# Patient Record
Sex: Female | Born: 1955 | Race: White | Hispanic: No | Marital: Single | Smoking: Former smoker
Health system: Southern US, Community
[De-identification: ages and names within clinical notes are randomized; demographics above are authoritative.]

## PROBLEM LIST (undated history)

## (undated) DIAGNOSIS — I10 Essential (primary) hypertension: Secondary | ICD-10-CM

## (undated) DIAGNOSIS — M810 Age-related osteoporosis without current pathological fracture: Secondary | ICD-10-CM

## (undated) DIAGNOSIS — M1990 Unspecified osteoarthritis, unspecified site: Secondary | ICD-10-CM

## (undated) DIAGNOSIS — F32A Depression, unspecified: Secondary | ICD-10-CM

## (undated) DIAGNOSIS — R413 Other amnesia: Secondary | ICD-10-CM

## (undated) DIAGNOSIS — T7840XA Allergy, unspecified, initial encounter: Secondary | ICD-10-CM

## (undated) DIAGNOSIS — M8700 Idiopathic aseptic necrosis of unspecified bone: Secondary | ICD-10-CM

## (undated) DIAGNOSIS — F329 Major depressive disorder, single episode, unspecified: Secondary | ICD-10-CM

## (undated) DIAGNOSIS — E559 Vitamin D deficiency, unspecified: Secondary | ICD-10-CM

## (undated) DIAGNOSIS — E785 Hyperlipidemia, unspecified: Secondary | ICD-10-CM

## (undated) DIAGNOSIS — J449 Chronic obstructive pulmonary disease, unspecified: Secondary | ICD-10-CM

## (undated) DIAGNOSIS — E119 Type 2 diabetes mellitus without complications: Secondary | ICD-10-CM

## (undated) DIAGNOSIS — K219 Gastro-esophageal reflux disease without esophagitis: Secondary | ICD-10-CM

## (undated) DIAGNOSIS — G459 Transient cerebral ischemic attack, unspecified: Secondary | ICD-10-CM

## (undated) HISTORY — DX: Gastro-esophageal reflux disease without esophagitis: K21.9

## (undated) HISTORY — DX: Essential (primary) hypertension: I10

## (undated) HISTORY — DX: Unspecified osteoarthritis, unspecified site: M19.90

## (undated) HISTORY — DX: Vitamin D deficiency, unspecified: E55.9

## (undated) HISTORY — DX: Idiopathic aseptic necrosis of unspecified bone: M87.00

## (undated) HISTORY — PX: BACK SURGERY: SHX140

## (undated) HISTORY — DX: Chronic obstructive pulmonary disease, unspecified: J44.9

## (undated) HISTORY — PX: SPINE SURGERY: SHX786

## (undated) HISTORY — DX: Major depressive disorder, single episode, unspecified: F32.9

## (undated) HISTORY — DX: Depression, unspecified: F32.A

## (undated) HISTORY — PX: TONSILLECTOMY AND ADENOIDECTOMY: SUR1326

## (undated) HISTORY — DX: Other amnesia: R41.3

## (undated) HISTORY — DX: Type 2 diabetes mellitus without complications: E11.9

## (undated) HISTORY — PX: BREAST EXCISIONAL BIOPSY: SUR124

## (undated) HISTORY — DX: Allergy, unspecified, initial encounter: T78.40XA

## (undated) HISTORY — PX: ANKLE SURGERY: SHX546

## (undated) HISTORY — PX: CATARACT EXTRACTION: SUR2

## (undated) HISTORY — DX: Hyperlipidemia, unspecified: E78.5

---
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
## 2000-02-17 ENCOUNTER — Encounter: Payer: Self-pay | Admitting: Internal Medicine

## 2000-02-17 ENCOUNTER — Ambulatory Visit (HOSPITAL_COMMUNITY): Admission: RE | Admit: 2000-02-17 | Discharge: 2000-02-17 | Payer: Self-pay | Admitting: Internal Medicine

## 2001-05-04 ENCOUNTER — Encounter: Admission: RE | Admit: 2001-05-04 | Discharge: 2001-05-04 | Payer: Self-pay | Admitting: Internal Medicine

## 2001-05-04 ENCOUNTER — Encounter: Payer: Self-pay | Admitting: Internal Medicine

## 2001-05-08 ENCOUNTER — Emergency Department (HOSPITAL_COMMUNITY): Admission: EM | Admit: 2001-05-08 | Discharge: 2001-05-08 | Payer: Self-pay | Admitting: Emergency Medicine

## 2002-02-11 ENCOUNTER — Emergency Department (HOSPITAL_COMMUNITY): Admission: EM | Admit: 2002-02-11 | Discharge: 2002-02-11 | Payer: Self-pay

## 2002-02-12 ENCOUNTER — Encounter: Payer: Self-pay | Admitting: Internal Medicine

## 2002-02-12 ENCOUNTER — Ambulatory Visit (HOSPITAL_COMMUNITY): Admission: RE | Admit: 2002-02-12 | Discharge: 2002-02-12 | Payer: Self-pay | Admitting: Internal Medicine

## 2002-05-16 ENCOUNTER — Encounter: Payer: Self-pay | Admitting: Orthopedic Surgery

## 2002-05-16 ENCOUNTER — Observation Stay (HOSPITAL_COMMUNITY): Admission: RE | Admit: 2002-05-16 | Discharge: 2002-05-17 | Payer: Self-pay | Admitting: Orthopedic Surgery

## 2003-02-14 ENCOUNTER — Inpatient Hospital Stay (HOSPITAL_COMMUNITY): Admission: RE | Admit: 2003-02-14 | Discharge: 2003-02-17 | Payer: Self-pay | Admitting: Orthopaedic Surgery

## 2004-02-24 ENCOUNTER — Ambulatory Visit (HOSPITAL_COMMUNITY): Admission: RE | Admit: 2004-02-24 | Discharge: 2004-02-24 | Payer: Self-pay | Admitting: Internal Medicine

## 2004-04-11 ENCOUNTER — Emergency Department (HOSPITAL_COMMUNITY): Admission: EM | Admit: 2004-04-11 | Discharge: 2004-04-11 | Payer: Self-pay | Admitting: Emergency Medicine

## 2005-03-11 ENCOUNTER — Other Ambulatory Visit: Admission: RE | Admit: 2005-03-11 | Discharge: 2005-03-11 | Payer: Self-pay | Admitting: Internal Medicine

## 2005-03-26 ENCOUNTER — Ambulatory Visit (HOSPITAL_COMMUNITY): Admission: RE | Admit: 2005-03-26 | Discharge: 2005-03-26 | Payer: Self-pay | Admitting: Internal Medicine

## 2005-09-03 ENCOUNTER — Emergency Department (HOSPITAL_COMMUNITY): Admission: EM | Admit: 2005-09-03 | Discharge: 2005-09-03 | Payer: Self-pay | Admitting: Emergency Medicine

## 2006-01-18 LAB — HM COLONOSCOPY: HM Colonoscopy: NORMAL

## 2006-03-18 ENCOUNTER — Ambulatory Visit (HOSPITAL_COMMUNITY): Admission: RE | Admit: 2006-03-18 | Discharge: 2006-03-18 | Payer: Self-pay | Admitting: Internal Medicine

## 2006-05-04 ENCOUNTER — Ambulatory Visit (HOSPITAL_COMMUNITY): Admission: RE | Admit: 2006-05-04 | Discharge: 2006-05-04 | Payer: Self-pay | Admitting: Internal Medicine

## 2007-04-05 ENCOUNTER — Ambulatory Visit (HOSPITAL_COMMUNITY): Admission: RE | Admit: 2007-04-05 | Discharge: 2007-04-05 | Payer: Self-pay | Admitting: Internal Medicine

## 2007-05-30 ENCOUNTER — Ambulatory Visit: Payer: Self-pay | Admitting: Cardiology

## 2007-06-02 ENCOUNTER — Ambulatory Visit: Payer: Self-pay

## 2008-04-25 ENCOUNTER — Ambulatory Visit (HOSPITAL_COMMUNITY): Admission: RE | Admit: 2008-04-25 | Discharge: 2008-04-25 | Payer: Self-pay | Admitting: Internal Medicine

## 2008-04-25 ENCOUNTER — Other Ambulatory Visit: Admission: RE | Admit: 2008-04-25 | Discharge: 2008-04-25 | Payer: Self-pay | Admitting: Internal Medicine

## 2009-04-18 LAB — HM MAMMOGRAPHY: HM Mammogram: NORMAL

## 2009-05-12 ENCOUNTER — Ambulatory Visit (HOSPITAL_COMMUNITY): Admission: RE | Admit: 2009-05-12 | Discharge: 2009-05-12 | Payer: Self-pay | Admitting: Internal Medicine

## 2010-03-07 ENCOUNTER — Encounter: Payer: Self-pay | Admitting: Internal Medicine

## 2010-03-08 ENCOUNTER — Encounter: Payer: Self-pay | Admitting: Internal Medicine

## 2010-05-01 ENCOUNTER — Other Ambulatory Visit (HOSPITAL_COMMUNITY): Payer: Self-pay | Admitting: Internal Medicine

## 2010-05-01 DIAGNOSIS — Z1231 Encounter for screening mammogram for malignant neoplasm of breast: Secondary | ICD-10-CM

## 2010-05-18 ENCOUNTER — Other Ambulatory Visit (HOSPITAL_COMMUNITY): Payer: Self-pay | Admitting: Internal Medicine

## 2010-05-18 ENCOUNTER — Ambulatory Visit (HOSPITAL_COMMUNITY)
Admission: RE | Admit: 2010-05-18 | Discharge: 2010-05-18 | Disposition: A | Discharge status: Home / Self Care | Payer: BC Managed Care – PPO | Source: Ambulatory Visit | Attending: Internal Medicine | Admitting: Internal Medicine

## 2010-05-18 DIAGNOSIS — F17200 Nicotine dependence, unspecified, uncomplicated: Secondary | ICD-10-CM | POA: Insufficient documentation

## 2010-05-18 DIAGNOSIS — Z1231 Encounter for screening mammogram for malignant neoplasm of breast: Secondary | ICD-10-CM

## 2010-05-18 DIAGNOSIS — Z0000 Encounter for general adult medical examination without abnormal findings: Secondary | ICD-10-CM | POA: Insufficient documentation

## 2010-06-30 NOTE — Assessment & Plan Note (Signed)
[facility]                            CARDIOLOGY OFFICE NOTE   Amber Hickman, Amber Hickman                     MRN:          [id number]  DATE:05/30/2007                            DOB:          [birth year]    The patient is a 55 year old female who I am asked to evaluate for  dyspnea and chest pain.  She has no prior cardiac history.  She does  have a long history of tobacco use, smoking two packs per day for  approximately 30 years.  She has some dyspnea with more moderate  activities, but not with routine activities around the house.  She does  have some orthopnea, but she attributes this to sleep apnea.  There is  no PND.  There is no pedal edema.  She occasionally feels a pain in her  chest when she feels anxious.  However, this is brief, lasting  seconds.  It does not radiate.  It is not positional, nor is it related  to food.  There is no associated symptoms.  She also has multiple risk  factors and cardiology was asked to further evaluate.  Risk factors  include a strong family history, tobacco abuse, hypertension and  hyperlipidemia.   PRESENT MEDICATIONS:  Lotrel, Lipitor 40 mg p.o. daily, citalopram and  vitamin D.   She has an ALLERGY TO PERCOCET.   She takes Hydrocodone and Robaxin as needed.   SOCIAL HISTORY:  She has a long history of tobacco use, smoking two  packs of cigarettes per day for 30 years.  She consumes approximately  two glass of wine per evening by report.   FAMILY HISTORY:  Positive for coronary artery disease.  Her father had a  myocardial infarction in his 91s and died.  Her mother had breast cancer  and throat cancer.  She is an only child.   PAST MEDICAL HISTORY:  Significant for hypertension and hyperlipidemia.  There is no diabetes mellitus.  She denies any history of COPD or  asthma.  There is no prior cardiac history.  She does have a history of  obstructive sleep apnea.  She has a history of gastroesophageal  reflux  disease and irritable bowel syndrome.  She has had a prior  tonsillectomy.  She has had previous back surgery twice.  She has also  had previous ankle surgery x3.  She also has some arthritis in her knee  that may require surgery by report.  There are no other surgeries noted.   REVIEW OF SYSTEMS:  She denies any headaches, fevers or chills.  She  does have a chronic cough.  There is no hemoptysis.  There is mild  dysphagia, that she attributes to her gastroesophageal disease.  There  is no melena or hematochezia.  There is no dysuria or hematuria.  She  denies seizure activity.  There is no PND or pedal edema.  She does have  some pain in her knees with ambulation.  There is also pain in her back  with ambulation.  The remaining systems are negative.   PHYSICAL EXAMINATION:  VITAL SIGNS:  Today,  shows a blood pressure of  139/79, pulse 78.  She weighs 171 pounds.  GENERAL:  She is well-developed and somewhat obese.  She is in no acute  distress at present.  SKIN:  Warm and dry.  She does not appear depressed.  There is no  peripheral clubbing.  She has had previous back surgery.  HEENT:  Normal with normal eyelids.  NECK:  Supple with a normal upstroke bilaterally.  No bruits heard.  There is no jugulovenous distention and no thyromegaly is noted.  CHEST:  Clear to auscultation with normal expansion.  CARDIOVASCULAR:  Regular rhythm.  Normal S1-S2.  There are no murmurs,  rubs or gallops noted.  There is no change with Valsalva.  ABDOMEN:  Nontender, nondistended.  Positive bowel sounds.  No  hepatosplenomegaly.  No mass appreciated.  There is no abdominal bruit.  She has 2+ femoral pulses bilaterally.  No bruits.  EXTREMITIES:  Show  no edema.  I could palpate no cords.  She has 2+ posterior tibial pulses  bilaterally.  NEUROLOGIC:  Grossly intact.   Her electrocardiogram shows a sinus rhythm at a rate of 79.  The axis is  normal.  There is a prolonged QT, but there are no  ST changes noted.   DIAGNOSIS:  1. Dyspnea on exertion - there is most likely a strong contribution      from her tobacco abuse and probable lung disease.  However, she has      multiple risk factors, including hypertension, hyperlipidemia,      strong family history, as well as tobacco abuse.  We will schedule      her to have an adenosine Myoview for risk stratification.  I do not      think she could perform adequately on a treadmill given her history      of back, ankle and knee pain.  If her Myoview shows no      abnormalities, then we will not pursue further cardiac evaluation.  2. Atypical chest pain - as per #1, we will plan to proceed with an      adenosine Myoview.  3. Tobacco abuse - we had a long discussion today concerning the      importance of discontinuing this.  4.  Hypertension - her blood      pressure appears to be adequately controlled on her present      medications.  4. Hyperlipidemia - she will continue on her statin and this is being      managed by Dr. Elisabeth Most.  5. Chronic back pain.  6. History of gastroesophageal reflux disease and irritable bowel      syndrome.   We will see her back on as-needed basis pending the results of her  Myoview.     Madolyn Frieze Jens Som, MD, [facility]  Electronically Signed    BSC/MedQ  DD: 05/30/2007  DT: 05/30/2007  Job #: [id number]   cc:   Lovenia Kim, D.O.

## 2010-07-03 NOTE — Op Note (Signed)
NAME:  Amber Hickman, Amber Hickman                        ACCOUNT NO.:  [id number]   MEDICAL RECORD NO.:  [id number]                   PATIENT TYPE:  INP   LOCATION:  5003                                 FACILITY:  [facility]   PHYSICIAN:  Sharolyn Douglas, M.D.                     DATE OF BIRTH:  04-Sep-1955   DATE OF PROCEDURE:  02/14/2003  DATE OF DISCHARGE:                                 OPERATIVE REPORT   PREOPERATIVE DIAGNOSIS:  1. Degenerative disk disease L5-S1 with chronic back and right leg pain.  2. Status post bilateral L5-S1 microdiskectomy.   POSTOPERATIVE DIAGNOSIS:  1. Degenerative disk disease L5-S1 with chronic back and right leg pain.  2. Status post bilateral L5-S1 microdiskectomy.   OPERATION PERFORMED:  1. Revision L5-S1 diskectomy and decompression of the lateral recess.  2. Transforaminal lumbar interbody fusion, L5-S1 with placement of a 10 mm     PEAK prosthetic cage packed with bone morphogenic protein and local     autogenous bone graft.  3. Posterior spinal arthrodesis, L5-S1.  4. Pedicle screw instrumentation, L5-S1 utilizing the Spinal Concepts and     Compass system.   SURGEON:  Sharolyn Douglas, M.D.   ASSISTANT:  Verlin Fester, P.A.   ANESTHESIA:  General endotracheal.   COMPLICATIONS:  None.   INDICATIONS FOR PROCEDURE:  The patient is a 55 year old female with  progressively worsening back and right lower extremity pain.  She is status  post bilateral L5-S1 hemilaminotomy and diskectomy by another surgeon.  She  had a short period of relief of her leg symptoms after the operation.  Unfortunately, she has developed progressively worsening back pain with  radiation into the right hip and leg.  Plain radiographs showed degenerative  changes at L5-S1.  Her MRI scan demonstrated postoperative changes at L5-S1  with fibrosis and possible recurrent disk herniation about the right S1  nerve root.  The disk space is severely narrowed.  The L4-5 level is well  preserved.   There is disk bulging at L3-4 and chronic degenerative changes  at L2-3.  Because of her severe persistent symptoms unresponsive to all  conservative care, she has elected to undergo L5-S1 revision decompression  and fusion in hopes of improving her symptoms.  The risks, benefits, and  alternatives were extensively discussed and she elected to proceed.   DESCRIPTION OF PROCEDURE:  The patient was properly identified in the  holding area and taken to the operating room.  She underwent general  endotracheal anesthesia without difficulty.  She was given prophylactic IV  antibiotics.  She was carefully turned prone onto the Wilson frame.  All  bony prominences were padded.  Face and eyes protected at all times.  The  back was prepped and draped in the usual sterile fashion.  The previous  midline incision was utilized.  Dissection was carried sharply through the  deep fascia.  The paraspinal muscles  were subperiosteally elevated out over  the tips of the transverse processes of L5 as well as the ala bilaterally.  Care was taken to protect the cephalad L4-5 facet joints.  There was an  extensive amount of epidural fibrosis noted with wide laminotomies at  L5-S1  bilaterally.  Curets were used to define the edges of the previous  laminotomies.  On the right side, a revision decompression was carried out  by removing the facet joint and residual lamina.  The spinous process of L5  was removed as well.  The bone was cleaned of all soft tissue and morcelized  for later autografting.  The thecal sac and S1 nerve root was found to be  encased within the epidural fibrosis.  The S1 nerve root was identified and  freed from the surrounding scar tissue.  The lateral recess decompressed  flush with the S1 pedicle.  We then turned our attention to performing a  transforaminal lumbar interbody fusion on the right side.  The disk space  was identified.  Bleeding was controlled with bipolar electrocautery.   The  exiting L5 nerve root was identified and protected at all times.  We  monitored free running EMGs throughout the procedure and there were no  deleterious changes.  Sharp annulotomy carried out.  The disk space was  found to be quite narrowed.  Radical diskectomy carried out across to the  contralateral side using specialized TLIF instruments.  The cartilaginous  end plates were scraped. The disk space was dilated up to 10 mm using  intervertebral spacers.  Angiocath irrigation was used to irrigate the disk  space.  We then packed local autogenous bone graft into the disk space  followed by a BMP sponge from a small Infuse kit.  We then placed a 10 mm  PEAK prosthetic cage packed with the BMP sponge.  This was carefully tamped  across the midline and anterior.  We confirmed good positioning with AP and  lateral fluoroscopy.  We then turned our attention to placing pedicle screws  at L5 and S1 bilaterally.  We used an anatomic probing technique and  fluoroscopy to identify the starting points.  Each pedicle was cannulated.  The pedicles were palpated using a ball tip feeler and there were no  breeches. We placed 6.5 x 45 mm screws at L5 bilaterally and 7.5 x 30 mm  screws in the sacrum bilaterally.  Each screw was then stimulated using  triggered EMGs and we had a response at greater than 20 mA consistent with  intraosseous placement of the screws.  We then turned our attention to  performing a posterior arthrodesis at L5-S1.  The transverse processes and  sacral ala were decorticated using high speed bur.  We packed remaining  local autogenous bone graft tightly into the gutters.  We then placed short  segment titanium rod.  Gentle compression was applied before shearing off  the locking cap.  The wound was copiously irrigated.  The nerve roots were  once again evaluated and found to be free out their respective foramina.  We inspected the area about the S1 nerve root where neurolysis  was carried out.  The dura over the root appeared to be somewhat thin although we did not  appreciate any CSF leakage.  Tisseal fibrin glue was left over the S1 nerve  root on the right side as a precautionary measure.  A deep Hemovac drain was  placed.  Deep fascia closed with a running #1  Vicryl suture.  Subcutaneous  layer closed with 0 Vicryl and 2-0 Vicryl.  Skin was closed using a 3-0  running nylon suture.  Sterile dressing applied.  The patient was turned  supine, extubated without difficulty and transferred to the recovery room in  stable condition able to move her upper and lower extremities.                                               Sharolyn Douglas, M.D.    MC/MEDQ  D:  02/14/2003  T:  02/15/2003  Job:  [id number]

## 2010-07-03 NOTE — H&P (Signed)
NAME:  Amber Hickman, Amber Hickman                        ACCOUNT NO.:  [id number]   MEDICAL RECORD NO.:  [id number]                   PATIENT TYPE:  INP   LOCATION:  2899                                 FACILITY:  [facility]   PHYSICIAN:  Sharolyn Douglas, M.D.                     DATE OF BIRTH:  05-13-1955   DATE OF ADMISSION:  02/14/2003  DATE OF DISCHARGE:                                HISTORY & PHYSICAL   CHIEF COMPLAINT:  Back and right lower extremity pain.   HISTORY OF PRESENT ILLNESS:  The patient is a 55 year old female status post  L5-S1 microdiskectomy in 2004.  Unfortunately, she had recurrent HNP at L5-  S1 and at this point she is having severe pain in her right lower extremity  and pain that is resistant to any conservative treatment.  The pain is  limiting her ability to function and is affecting her quality of life.  The  risks and benefits of posterior spinal fusion and the procedure were  discussed with her by Dr. Noel Gerold as well as by myself, she indicated an  understanding and opted to proceed.   ALLERGIES:  CODEINE CAUSES NAUSEA.   MEDICATIONS:  1. Norco 10/325 p.r.n.  2. Robaxin 500 mg p.r.n.  3. Lipitor 20 mg p.o. daily.  4. Ziac 10/6.25 p.o. daily.  5. Prevacid 30 mg p.o. daily.   PAST MEDICAL HISTORY:  1. Hypertension.  2. Hypercholesterolemia.  3. GERD.   PAST SURGICAL HISTORY:  1. Tonsillectomy in 1964.  2. Right ankle surgery in 1971, 1992 and 1993.  3. Microdiskectomy in 2004 at L5-S1.   SOCIAL HISTORY:  The patient smokes one and a half packs of cigarettes per  day but denies alcohol use.  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   FAMILY HISTORY:  Father is deceased at age 48 secondary to MI.  History of  breast cancer in the family.   REVIEW OF SYSTEMS:  The patient denies fevers, chills, sweats or bleeding  tendencies.  CNS: Denies blurred vision, double vision, seizures, headache.  CARDIOVASCULAR: Denies chest pain, angina, orthopnea,  claudication,  palpitations.  PULMONARY:  Denies shortness of breath , productive cough,  hemoptysis.  GI:  Denies nausea, vomiting, diarrhea.  Does have constipation  secondary to pain medications but she manages this with diet modification as  well as adding fiber.  Denies melena or bloody stools.  GU:  Denies  history  of hematuria or discharge.  MUSCULOSKELETAL:  As stated in the history of  present illness.   PHYSICAL EXAMINATION:  VITAL SIGNS:  Blood pressure 122/78, respirations 16  and unlabored, pulse 84 and regular.  GENERAL:  The patient is a 55 year old white female who is alert and  oriented and in no acute distress, well-developed, well-nourished and  cooperative with exam.  HEENT:  Normocephalic, atraumatic.  Pupils equal, round  and reactive,  extraocular movements intact. Nares patent and pharynx clear.  NECK:  Supple to palpation, no lymphadenopathy, thyromegaly or bruits  appreciated.  CHEST:  Clear to auscultation bilaterally, no rales, rhonchi, stridor or  wheezes or friction rubs.  BREASTS:  Not pertinent.  HEART:  S1 and S2, regular rate.  ABDOMEN:  No murmurs, rubs or gallops.  Soft to palpation, nontender, non-  distended, no organomegaly noted, positive bowel sounds throughout.  GU:  Not pertinent.  EXTREMITIES:  The patient has right lower extremity pain.  Motor function  and sensation are grossly intact.  Pulses are intact and symmetric.  Calves  are soft and nontender.  SKIN:  Intact without lesions or rashes.   MRI shows herniated nucleus pulposus L5-S1 and degenerative disk disease at  that level as well.   IMPRESSION:  1. Herniated nucleus pulposus/degenerative disk disease at L5-S1.  2. Hypertension.  3. Hypercholesterolemia.  4. Gastroesophageal reflux disease.   PLAN:  1. Admit to [facility] on February 14, 2003 for posterior spinal     fusion at L5-S1, this will be done by Dr. Sharolyn Douglas.  2. The patient's primary care physician is  Dr. Marisue Brooklyn, we will     certainly notify her upon completion of surgery.      Verlin Fester, P.A.                       Sharolyn Douglas, M.D.    CM/MEDQ  D:  02/14/2003  T:  02/14/2003  Job:  [id number]   cc:   Lovenia Kim, D.O.  [address], [address]  [place]  [place] [address]  Fax: [phone]

## 2010-07-03 NOTE — Op Note (Signed)
NAME:  Amber Hickman, Amber Hickman                        ACCOUNT NO.:  [id number]   MEDICAL RECORD NO.:  [id number]                   PATIENT TYPE:  AMB   LOCATION:  DAY                                  FACILITY[facility]   PHYSICIAN:  Georges Lynch. Gioffre, M.D.             DATE OF BIRTH:  [birth year]   DATE OF PROCEDURE:  05/16/2002  DATE OF DISCHARGE:                                 OPERATIVE REPORT   PREOPERATIVE DIAGNOSES:  1. Lateral recess stenosis bilaterally at L5-S1.  2. Herniated lumbar disk centrally at L5-S1.   POSTOPERATIVE DIAGNOSES:  1. Lateral recess stenosis bilaterally at L5-S1.  2. Herniated lumbar disk centrally at L5-S1.   PROCEDURES:  1. Hemilaminectomy at L5-S1 bilaterally.  2. Decompression of the lateral recess at L5-S1 bilaterally.  3. Microdiskectomy bilaterally at L5-S1.   SURGEON:  Georges Lynch. Darrelyn Hillock, M.D.   ASSISTANT:  Ronnell Guadalajara, M.D.   CLINICAL NOTE:  She presented with bilateral leg pain.   DESCRIPTION OF PROCEDURE:  Under general anesthesia, the patient in a spinal  frame, a routine orthopedic prep and draping of the lower back carried out.  Two needles were placed in the back for localization purposes and an x-ray  was taken.  At this time once we identified the L5-S1 interspace, the  incision was made over the space and bleeders identified and cauterized.  We  then stripped the muscle from the lamina and the spinous process from L5-S1.  We identified the sacrum.  Another x-ray was taken to verify our position.  Following this we then went down and carried out hemilaminectomies  bilaterally on the right and on the left.  We brought the microscope in.  We  removed the ligamentum flavum, and we protected the dura with cottonoids and  then noted that she had rather significant recess stenosis bilaterally.  Actually, the left was greater than the right.  We decompressed the lateral  recesses, went out and did partial facetectomies, identified the nerve  roots, the S1 roots bilaterally, and noted that she had a large central  disk, more central and to the right, so we went down, protected the dura  with the D'Errico retractor, made a cruciate incision at the posterior  longitudinal ligament, and did a microdiskectomy.  We then went across the  midline with the Epstein curettes and compressed the disk material down into  the space and then went back into the space again and completed diskectomy.  Then we went on the opposite side and retracted the dura and did the exact  same procedure.  We kept the S1 nerve root in site at all times.  Once we  did our complete bilateral decompression of the disk, we then were able to  easily pass a hockey stick out the foramina bilaterally and also up  under the dura.  The dura was now free.  The nerve roots were free.  WE  thoroughly irrigated out the area and loosely applied some thrombin-soaked  Gelfoam.  The patient left the operating room in satisfactory condition  after the wound was closed in the usual fashion and a sterile dressing was  applied.  She had 1 g of IV Ancef preop.                                               Ronald A. Darrelyn Hillock, M.D.    RAG/MEDQ  D:  05/16/2002  T:  05/16/2002  Job:  [id number]

## 2010-07-03 NOTE — Discharge Summary (Signed)
NAME:  Amber Hickman, Amber Hickman                        ACCOUNT NO.:  [id number]   MEDICAL RECORD NO.:  [id number]                   PATIENT TYPE:  INP   LOCATION:  5003                                 FACILITY:  [facility]   PHYSICIAN:  Sharolyn Douglas, M.D.                     DATE OF BIRTH:  [birth year]   DATE OF ADMISSION:  02/14/2003  DATE OF DISCHARGE:  02/17/2003                                 DISCHARGE SUMMARY   ADMITTING DIAGNOSIS:  1. Degenerative disk disease, L5-S1.  2. Hypertension.  3. Hypercholesterolemia.  4. Gastroesophageal reflux disease.   DISCHARGE DIAGNOSES:  1. Status post L5-S1 posterior spinal fusion.  2. Low-grade temperature postoperatively, resolved prior to discharge.  3. Mild hemorrhagic anemia postoperatively; this did not require blood     transfusion.   CONSULTS:  None.   PROCEDURE:  L5-S1 posterior spinal fusion with pedicle screws as well as an  L5-S1 transverse lumbar interbody fusion; this was done on February 14, 2003  by Sharolyn Douglas, M.D., assistant -- Verlin Fester, P.A.  Anesthesia used was  general.   LABORATORIES:  Pre-admit labs:  CBC with differential was within normal  limits.  Postoperatively, hemoglobin and hematocrit were monitored and  reached a low of 11.3 and 33.0, respectively, on February 17, 2003, however,  this was asymptomatic and did not require any transfusions.  PT, INR and PTT  preoperatively were normal.  Complete metabolic panel preoperatively was  normal with the exception of glucose that measured at 110.  BMET  postoperatively was normal with the exception of glucose slightly elevated  at 116 and 121.  Potassium did drop down to 2.8 on February 15, 2003,  however, was 3.5 the following day after being given potassium.  Blood type  was type AB, Rh-negative, antibody screen negative.   BRIEF HISTORY:  The patient is a 55 year old female who is status post L5-S1  microdiskectomy in 2004.  Unfortunately, she had a recurrent HNP at L5-S1  and at that point, prior to surgery, she was having severe pain in her right  lower extremity and pain that was resistant to any conservative treatment  and was limiting her ability to function and was affecting her quality of  life, therefore, risks and benefits of posterior spinal fusion procedure  were discussed with the patient by Dr. Noel Gerold, as well as myself; she  indicated understanding and opted to proceed.   HOSPITAL COURSE:  On February 14, 2003, the patient was taken to the  operating room for the above-listed procedure.  She tolerated the procedure  well without any intraoperative complications, approximately 200 mL of blood  lost.  There was 1 Hemovac drain placed.  Postoperatively, routine  orthopedic spine protocol was followed.  She progressed well with physical  therapy and occupational therapy.  As previously dictated, her potassium was  2.8 on February 15, 2003, therefore, she was given oral potassium,  which her  potassium responded nicely by increasing up to 3.5 the following day.  Her  diet was slowly advanced.  She did not develop any postoperative  complications.  By February 17, 2003, the patient had met all her orthopedic  goals; she was medically stable and ready for discharge and she was  discharged home with her family.   ACTIVITY ON DISCHARGE:  The patient should ambulate daily.  She would wear a  brace when she is up.  She is to avoid any lifting heavier than 5 pounds and  she is to maintain back precautions at all times.   WOUND CARE:  She should have dressing changes daily.   FOLLOWUP:  She is to follow up with Dr. Noel Gerold 10 to 14 days postoperatively  and call for an appointment.   DIET:  Diet is regular home diet.   CONDITION ON DISCHARGE:  Stable and improved.   DISPOSITION:  The patient can be discharged to her home with her family's  assistance as well as home health physical therapy and occupational therapy.   MEDICATIONS ON DISCHARGE:  Percocet,  Robaxin, multivitamin, calcium and over-  the-counter laxative as needed.      Verlin Fester, P.A.                       Sharolyn Douglas, M.D.    CM/MEDQ  D:  04/03/2003  T:  04/03/2003  Job:  [id number]

## 2011-01-21 ENCOUNTER — Other Ambulatory Visit: Payer: Self-pay | Admitting: Internal Medicine

## 2011-01-21 DIAGNOSIS — E041 Nontoxic single thyroid nodule: Secondary | ICD-10-CM

## 2011-01-26 ENCOUNTER — Ambulatory Visit
Admission: RE | Admit: 2011-01-26 | Discharge: 2011-01-26 | Disposition: A | Discharge status: Home / Self Care | Payer: BC Managed Care – PPO | Source: Ambulatory Visit | Attending: Internal Medicine | Admitting: Internal Medicine

## 2011-01-26 DIAGNOSIS — E041 Nontoxic single thyroid nodule: Secondary | ICD-10-CM

## 2012-07-24 ENCOUNTER — Other Ambulatory Visit: Payer: Self-pay | Admitting: Pain Medicine

## 2012-07-24 DIAGNOSIS — M79602 Pain in left arm: Secondary | ICD-10-CM

## 2012-07-24 DIAGNOSIS — M542 Cervicalgia: Secondary | ICD-10-CM

## 2012-07-24 DIAGNOSIS — M546 Pain in thoracic spine: Secondary | ICD-10-CM

## 2012-07-24 DIAGNOSIS — M545 Low back pain: Secondary | ICD-10-CM

## 2012-08-02 ENCOUNTER — Ambulatory Visit
Admission: RE | Admit: 2012-08-02 | Discharge: 2012-08-02 | Disposition: A | Discharge status: Home / Self Care | Payer: BC Managed Care – PPO | Source: Ambulatory Visit | Attending: Pain Medicine | Admitting: Pain Medicine

## 2012-08-02 ENCOUNTER — Other Ambulatory Visit: Payer: Self-pay | Admitting: Pain Medicine

## 2012-08-02 DIAGNOSIS — M546 Pain in thoracic spine: Secondary | ICD-10-CM

## 2012-08-02 DIAGNOSIS — M79602 Pain in left arm: Secondary | ICD-10-CM

## 2012-08-02 DIAGNOSIS — M542 Cervicalgia: Secondary | ICD-10-CM

## 2012-08-02 DIAGNOSIS — M545 Low back pain: Secondary | ICD-10-CM

## 2012-12-26 ENCOUNTER — Other Ambulatory Visit: Payer: Self-pay | Admitting: Physician Assistant

## 2012-12-26 MED ORDER — FLUOXETINE HCL 40 MG PO CAPS: 40.0000 mg | ORAL_CAPSULE | Freq: Every day | ORAL | Status: DC

## 2013-01-18 ENCOUNTER — Telehealth: Payer: Self-pay | Admitting: *Deleted

## 2013-01-18 ENCOUNTER — Encounter: Payer: Self-pay | Admitting: Physician Assistant

## 2013-01-18 DIAGNOSIS — E559 Vitamin D deficiency, unspecified: Secondary | ICD-10-CM

## 2013-01-18 DIAGNOSIS — E119 Type 2 diabetes mellitus without complications: Secondary | ICD-10-CM

## 2013-01-18 DIAGNOSIS — E782 Mixed hyperlipidemia: Secondary | ICD-10-CM | POA: Insufficient documentation

## 2013-01-18 DIAGNOSIS — E785 Hyperlipidemia, unspecified: Secondary | ICD-10-CM

## 2013-01-18 DIAGNOSIS — I10 Essential (primary) hypertension: Secondary | ICD-10-CM

## 2013-01-18 MED ORDER — FLUOXETINE HCL 40 MG PO CAPS: 40.0000 mg | ORAL_CAPSULE | Freq: Two times a day (BID) | ORAL | Status: DC

## 2013-01-18 NOTE — Telephone Encounter (Signed)
12/26/12 = Got prozac40mg  qd  But only  Got 30 pills needs to be 40mg  bid  #60  To [facility] on [address]

## 2013-01-18 NOTE — Telephone Encounter (Signed)
Advised patient that her RX was corrected and sent to pharmacy

## 2013-01-19 ENCOUNTER — Encounter: Payer: Self-pay | Admitting: Physician Assistant

## 2013-01-19 ENCOUNTER — Ambulatory Visit (INDEPENDENT_AMBULATORY_CARE_PROVIDER_SITE_OTHER): Payer: BC Managed Care – PPO | Admitting: Physician Assistant

## 2013-01-19 VITALS — BP 120/62 | HR 80 | Temp 97.7°F | Resp 16 | Ht 64.0 in | Wt 160.0 lb

## 2013-01-19 DIAGNOSIS — E782 Mixed hyperlipidemia: Secondary | ICD-10-CM

## 2013-01-19 DIAGNOSIS — E785 Hyperlipidemia, unspecified: Secondary | ICD-10-CM

## 2013-01-19 LAB — LIPID PANEL
Cholesterol: 163 mg/dL (ref 0–200)
Total CHOL/HDL Ratio: 2.9 Ratio
Triglycerides: 139 mg/dL (ref <150)
VLDL: 28 mg/dL (ref 0–40)

## 2013-01-19 NOTE — Patient Instructions (Signed)
Fat and Cholesterol Control Diet  Fat and cholesterol levels in your blood and organs are influenced by your diet. High levels of fat and cholesterol may lead to diseases of the heart, small and large blood vessels, gallbladder, liver, and pancreas.  CONTROLLING FAT AND CHOLESTEROL WITH DIET  Although exercise and lifestyle factors are important, your diet is key. That is because certain foods are known to raise cholesterol and others to lower it. The goal is to balance foods for their effect on cholesterol and more importantly, to replace saturated and trans fat with other types of fat, such as monounsaturated fat, polyunsaturated fat, and omega-3 fatty acids.  On average, a person should consume no more than 15 to 17 g of saturated fat daily. Saturated and trans fats are considered "bad" fats, and they will raise LDL cholesterol. Saturated fats are primarily found in animal products such as meats, butter, and cream. However, that does not mean you need to give up all your favorite foods. Today, there are good tasting, low-fat, low-cholesterol substitutes for most of the things you like to eat. Choose low-fat or nonfat alternatives. Choose round or loin cuts of red meat. These types of cuts are lowest in fat and cholesterol. Chicken (without the skin), fish, veal, and ground turkey breast are great choices. Eliminate fatty meats, such as hot dogs and salami. Even shellfish have little or no saturated fat. Have a 3 oz (85 g) portion when you eat lean meat, poultry, or fish.  Trans fats are also called "partially hydrogenated oils." They are oils that have been scientifically manipulated so that they are solid at room temperature resulting in a longer shelf life and improved taste and texture of foods in which they are added. Trans fats are found in stick margarine, some tub margarines, cookies, crackers, and baked goods.   When baking and cooking, oils are a great substitute for butter. The monounsaturated oils are  especially beneficial since it is believed they lower LDL and raise HDL. The oils you should avoid entirely are saturated tropical oils, such as coconut and palm.   Remember to eat a lot from food groups that are naturally free of saturated and trans fat, including fish, fruit, vegetables, beans, grains (barley, rice, couscous, bulgur wheat), and pasta (without cream sauces).   IDENTIFYING FOODS THAT LOWER FAT AND CHOLESTEROL   Soluble fiber may lower your cholesterol. This type of fiber is found in fruits such as apples, vegetables such as broccoli, potatoes, and carrots, legumes such as beans, peas, and lentils, and grains such as barley. Foods fortified with plant sterols (phytosterol) may also lower cholesterol. You should eat at least 2 g per day of these foods for a cholesterol lowering effect.   Read package labels to identify low-saturated fats, trans fat free, and low-fat foods at the supermarket. Select cheeses that have only 2 to 3 g saturated fat per ounce. Use a heart-healthy tub margarine that is free of trans fats or partially hydrogenated oil. When buying baked goods (cookies, crackers), avoid partially hydrogenated oils. Breads and muffins should be made from whole grains (whole-wheat or whole oat flour, instead of "flour" or "enriched flour"). Buy non-creamy canned soups with reduced salt and no added fats.   FOOD PREPARATION TECHNIQUES   Never deep-fry. If you must fry, either stir-fry, which uses very little fat, or use non-stick cooking sprays. When possible, broil, bake, or roast meats, and steam vegetables. Instead of putting butter or margarine on vegetables, use lemon   and herbs, applesauce, and cinnamon (for squash and sweet potatoes). Use nonfat yogurt, salsa, and low-fat dressings for salads.   LOW-SATURATED FAT / LOW-FAT FOOD SUBSTITUTES  Meats / Saturated Fat (g)  · Avoid: Steak, marbled (3 oz/85 g) / 11 g  · Choose: Steak, lean (3 oz/85 g) / 4 g  · Avoid: Hamburger (3 oz/85 g) / 7  g  · Choose: Hamburger, lean (3 oz/85 g) / 5 g  · Avoid: Ham (3 oz/85 g) / 6 g  · Choose: Ham, lean cut (3 oz/85 g) / 2.4 g  · Avoid: Chicken, with skin, dark meat (3 oz/85 g) / 4 g  · Choose: Chicken, skin removed, dark meat (3 oz/85 g) / 2 g  · Avoid: Chicken, with skin, light meat (3 oz/85 g) / 2.5 g  · Choose: Chicken, skin removed, light meat (3 oz/85 g) / 1 g  Dairy / Saturated Fat (g)  · Avoid: Whole milk (1 cup) / 5 g  · Choose: Low-fat milk, 2% (1 cup) / 3 g  · Choose: Low-fat milk, 1% (1 cup) / 1.5 g  · Choose: Skim milk (1 cup) / 0.3 g  · Avoid: Hard cheese (1 oz/28 g) / 6 g  · Choose: Skim milk cheese (1 oz/28 g) / 2 to 3 g  · Avoid: Cottage cheese, 4% fat (1 cup) / 6.5 g  · Choose: Low-fat cottage cheese, 1% fat (1 cup) / 1.5 g  · Avoid: Ice cream (1 cup) / 9 g  · Choose: Sherbet (1 cup) / 2.5 g  · Choose: Nonfat frozen yogurt (1 cup) / 0.3 g  · Choose: Frozen fruit bar / trace  · Avoid: Whipped cream (1 tbs) / 3.5 g  · Choose: Nondairy whipped topping (1 tbs) / 1 g  Condiments / Saturated Fat (g)  · Avoid: Mayonnaise (1 tbs) / 2 g  · Choose: Low-fat mayonnaise (1 tbs) / 1 g  · Avoid: Butter (1 tbs) / 7 g  · Choose: Extra light margarine (1 tbs) / 1 g  · Avoid: Coconut oil (1 tbs) / 11.8 g  · Choose: Olive oil (1 tbs) / 1.8 g  · Choose: Corn oil (1 tbs) / 1.7 g  · Choose: Safflower oil (1 tbs) / 1.2 g  · Choose: Sunflower oil (1 tbs) / 1.4 g  · Choose: Soybean oil (1 tbs) / 2.4 g  · Choose: Canola oil (1 tbs) / 1 g  Document Released: 02/01/2005 Document Revised: 05/29/2012 Document Reviewed: 07/23/2010  [employer]® Patient Information ©2014 [employer].

## 2013-01-19 NOTE — Progress Notes (Signed)
HPI Patient presents for a one month follow up. She was on pravastatin for her cholesterol however her last LDL was 239 so the patient was switched to atrovastatin 80 1/2 a tablet daily.  In addition she has chronic back pain and has been waiting to hear back from a pain clinic after being dismissed from two other practices for narcotic contract violations.   Past Medical History  Diagnosis Date  . Hypertension   . Hyperlipidemia   . Diabetes mellitus without complication   . Depression   . GERD (gastroesophageal reflux disease)   . Arthritis   . COPD (chronic obstructive pulmonary disease)   . Vitamin D deficiency   . AVN (avascular necrosis of bone)     Left shoulder  . Allergy      Allergies  Allergen Reactions  . Augmentin [Amoxicillin-Pot Clavulanate]   . Codeine   . Wellbutrin [Bupropion]   . Zoloft [Sertraline Hcl]       Current Outpatient Prescriptions on File Prior to Visit  Medication Sig Dispense Refill  . carisoprodol (SOMA) 350 MG tablet Take 350 mg by mouth 4 (four) times daily as needed for muscle spasms.      . cholecalciferol (VITAMIN D) 1000 UNITS tablet Take 5,000 Units by mouth daily.      Marland Kitchen FLUoxetine (PROZAC) 40 MG capsule Take 1 capsule (40 mg total) by mouth 2 (two) times daily.  60 capsule  3  . vitamin B-12 (CYANOCOBALAMIN) 250 MCG tablet Take 250 mcg by mouth daily.       No current facility-administered medications on file prior to visit.    ROS: all negative expect above.   Physical: Filed Weights   01/19/13 1137  Weight: 160 lb (72.576 kg)   Filed Vitals:   01/19/13 1137  BP: 120/62  Pulse: 80  Temp: 97.7 F (36.5 C)  Resp: 16   General Appearance: Well nourished, in no apparent distress. Eyes: PERRLA, EOMs. Sinuses: No Frontal/maxillary tenderness ENT/Mouth: Ext aud canals clear, normal light reflex with TMs without erythema, bulging. Post pharynx without erythema, swelling, exudate.  Respiratory: CTAB Cardio: RRR, no murmurs,  rubs or gallops. Peripheral pulses brisk and equal bilaterally, without edema. No aortic or femoral bruits. Abdomen: Flat, soft, with bowl sounds. Nontender, no guarding, rebound. Lymphatics: Non tender without lymphadenopathy.  Musculoskeletal: Full ROM all peripheral extremities, 5/5 strength, and normal gait. Skin: Warm, dry without rashes, lesions, ecchymosis.  Neuro: Cranial nerves intact, reflexes equal bilaterally. Normal muscle tone, no cerebellar symptoms. Sensation intact.  Pysch: Awake and oriented X 3, normal affect, Insight and Judgment appropriate.   Assessment and Plan: Hyperlipidemia- Check Lipids and LFTS were done at [facility].  Lower back pain- She is requesting pain medication from our office today, but we are unable to provide that to her. We will not give her any narcotic medications and the patient understands this. We have encouraged her to continue to contact her pain management clinic.

## 2013-02-28 ENCOUNTER — Ambulatory Visit: Payer: Self-pay | Admitting: Physician Assistant

## 2013-04-04 ENCOUNTER — Ambulatory Visit (INDEPENDENT_AMBULATORY_CARE_PROVIDER_SITE_OTHER): Payer: BC Managed Care – PPO | Admitting: Physician Assistant

## 2013-04-04 ENCOUNTER — Encounter: Payer: Self-pay | Admitting: Physician Assistant

## 2013-04-04 VITALS — BP 120/72 | HR 88 | Temp 97.7°F | Resp 16 | Ht 64.0 in | Wt 161.0 lb

## 2013-04-04 DIAGNOSIS — J209 Acute bronchitis, unspecified: Secondary | ICD-10-CM

## 2013-04-04 MED ORDER — LEVOFLOXACIN 500 MG PO TABS: 500.0000 mg | ORAL_TABLET | Freq: Every day | ORAL | Status: DC

## 2013-04-04 MED ORDER — PROMETHAZINE-DM 6.25-15 MG/5ML PO SYRP: 5.0000 mL | ORAL_SOLUTION | Freq: Four times a day (QID) | ORAL | Status: DC | PRN

## 2013-04-04 MED ORDER — ALBUTEROL SULFATE HFA 108 (90 BASE) MCG/ACT IN AERS: 2.0000 | INHALATION_SPRAY | Freq: Four times a day (QID) | RESPIRATORY_TRACT | Status: DC | PRN

## 2013-04-04 MED ORDER — IPRATROPIUM-ALBUTEROL 0.5-2.5 (3) MG/3ML IN SOLN
3.0000 mL | Freq: Once | RESPIRATORY_TRACT | Status: AC
  Administered 2013-04-04: 3 mL via RESPIRATORY_TRACT

## 2013-04-04 NOTE — Progress Notes (Signed)
   Subjective:    Patient ID: Amber Hickman, female    DOB: Jul 01, 1955, 58 y.o.   MRN: [id number]  Cough This is a new problem. The current episode started in the past 7 days. The problem has been gradually worsening. The problem occurs constantly. The cough is productive of purulent sputum. Associated symptoms include chest pain (when lying down, very localized left sided tenderness), ear congestion, headaches, nasal congestion, postnasal drip, rhinorrhea, shortness of breath and wheezing. Pertinent negatives include no chills, ear pain, fever, heartburn, hemoptysis, myalgias, rash, sore throat, sweats or weight loss. The symptoms are aggravated by lying down and cold air. Risk factors for lung disease include smoking/tobacco exposure. Treatments tried: Cold medicine.  Improvement on treatment: Cold medicine.     Review of Systems  Constitutional: Positive for fatigue. Negative for fever, chills and weight loss.  HENT: Positive for congestion, postnasal drip, rhinorrhea and sinus pressure. Negative for dental problem, ear discharge, ear pain, nosebleeds, sore throat, trouble swallowing and voice change.   Respiratory: Positive for cough, chest tightness, shortness of breath and wheezing. Negative for hemoptysis.   Cardiovascular: Positive for chest pain (when lying down, very localized left sided tenderness).  Gastrointestinal: Negative.  Negative for heartburn.  Genitourinary: Negative.   Musculoskeletal: Negative.  Negative for myalgias.  Skin: Negative for rash.  Neurological: Positive for headaches.       Objective:   Physical Exam  Constitutional: She is oriented to person, place, and time. She appears well-developed and well-nourished.  HENT:  Head: Normocephalic and atraumatic.  Right Ear: External ear normal.  Left Ear: External ear normal.  Nose: Right sinus exhibits maxillary sinus tenderness. Left sinus exhibits maxillary sinus tenderness.  Mouth/Throat: Oropharynx is  clear and moist.  Eyes: Conjunctivae are normal. Pupils are equal, round, and reactive to light.  Neck: Normal range of motion. Neck supple.  Cardiovascular: Normal rate and regular rhythm.   Pulmonary/Chest: Effort normal. No respiratory distress. She has wheezes. She has no rales. She exhibits no tenderness.  Abdominal: Soft. Bowel sounds are normal.  Lymphadenopathy:    She has no cervical adenopathy.  Neurological: She is alert and oriented to person, place, and time.  Skin: Skin is warm and dry.      Assessment & Plan:  Acute bronchitis - Plan: albuterol (PROVENTIL HFA;VENTOLIN HFA) 108 (90 BASE) MCG/ACT inhaler, promethazine-dextromethorphan (PROMETHAZINE-DM) 6.25-15 MG/5ML syrup, levofloxacin (LEVAQUIN) 500 MG tablet, ipratropium-albuterol (DUONEB) 0.5-2.5 (3) MG/3ML nebulizer solution 3 mL

## 2013-04-04 NOTE — Patient Instructions (Signed)
Please take the prednisone to help decrease inflammation and therefore decrease symptoms. Take it it with food to avoid GI upset. It can cause increased energy but on the other hand it can make it hard to sleep at night so please take it in the morning.  It is not an antibiotic so you can stop it early if you are feeling better.  If you are diabetic it will increase your sugars.   The majority of colds are caused by viruses and do not require antibiotics. Please read the rest of this hand out to learn more about the common cold and what you can do to help yourself as well as help prevent the over use of antibiotics.   COMMON COLD SIGNS AND SYMPTOMS - The common cold usually causes nasal congestion, runny nose, and sneezing. A sore throat may be present on the first day but usually resolves quickly. If a cough occurs, it generally develops on about the fourth or fifth day of symptoms, typically when congestion and runny nose are resolving  COMMON COLD COMPLICATIONS - In most cases, colds do not cause serious illness or complications. Most colds last for three to seven days, although many people continue to have symptoms (coughing, sneezing, congestion) for up to two weeks.  One of the more common complications is sinusitis, which is usually caused by viruses and rarely (about 2 percent of the time) by bacteria. Having thick or yellow to green-colored nasal discharge does not mean that bacterial sinusitis has developed; discolored nasal discharge is a normal phase of the common cold.  Lower respiratory infections, such as pneumonia or bronchitis, may develop following a cold.  Infection of the middle ear, or otitis media, can accompany or follow a cold.  COMMON COLD TREATMENT - There is no specific treatment for the viruses that cause the common cold. Most treatments are aimed at relieving some of the symptoms of the cold, but do not shorten or cure the cold. Antibiotics are not useful for treating the  common cold; antibiotics are only used to treat illnesses caused by bacteria, not viruses. Unnecessary use of antibiotics for the treatment of the common cold can cause allergic reactions, diarrhea, or other gastrointestinal symptoms in some patients.  The symptoms of a cold will resolve over time, even without any treatment. People with underlying medical conditions and those who use other over-the-counter or prescription medications should speak with their healthcare provider or pharmacist to ensure that it is safe to use these treatments. The following are treatments that may reduce the symptoms caused by the common cold.  Nasal congestion - Decongestants are good for nasal congestion- if you feel very stuffy but no mucus is coming out, this is the medication that will help you the most.  Pseudoephedrine is a decongestant that can improve nasal congestion. Although a prescription is not required, drugstores in the [place] keep pseudoephedrine behind the counter, so it must be requested from a pharmacist. If you have a heart condition or high blood pressure please use Coricidin BPH instead.   Runny nose - Antihistamines such as diphenhydramine (Benadryl), certazine (Zyrtec) which are best taking at night because they can make you tired OR loratadine (Claritin),  fexafinadine (Allegra) help with a runny nose.   Nasal sprays such an oxymetazoline (Afrin and others) may also give temporary relief of nasal congestion. However, these sprays should never be used for more than two to three days; use for more than three days use can worsen congestion.    Nasocort is now over the counter and can help decrease a runny nose. Please stop the medication if you have blurry vision or nose bleeds.   Sore throat and headache - Sore throat and headache are best treated with a mild pain reliever such as acetaminophen (Tylenol) or a non-steroidal anti-inflammatory agent such as ibuprofen or naproxen (Motrin or Aleve).  These medications should be taken with food to prevent stomach problems. As well as gargling with warm water and salt.   Cough - Common cough medicine ingredients include guaifenesin and dextromethorphan; these are often combined with other medications in over-the-counter cold formulas. Often a cough is worse at night or first in the morning due to post nasal drip from you nose. You can try to sleep at an angle to decrease a cough.   Alternative treatments - Heated, humidified air can improve symptoms of nasal congestion and runny nose, and causes few to no side effects. A number of alternative products, including vitamin C, doubling up on your vitamin D and herbal products such as echinacea, may help. Certain products, such as nasal gels that contain zinc (eg, Zicam), have been associated with a permanent loss of smell.  Antibiotics - Antibiotics should not be used to treat an uncomplicated common cold. As noted above, colds are caused by viruses. Antibiotics treat bacterial, not viral infections. Some viruses that cause the common cold can also depress the immune system or cause swelling in the lining of the nose or airways; this can, in turn, lead to a bacterial infection. Often you need to give your body 7 days to fight off a common cold while treating the symptoms with the medications listed above. If after 7 days your symptoms are not improving, you are getting worse, you have shortness of breath, chest pain, a fever of over 103 you should seek medical help immediately.   PREVENTION IS THE BEST MEDICINE - Hand washing is an essential and highly effective way to prevent the spread of infection.  Alcohol-based hand rubs are a good alternative for disinfecting hands if a sink is not available.  Hands should be washed before preparing food and eating and after coughing, blowing the nose, or sneezing. While it is not always possible to limit contact with people who may be infected with a cold, touching  the eyes, nose, or mouth after direct contact should be avoided when possible. Sneezing/coughing into the sleeve of one's clothing (at the inner elbow) is another means of containing sprays of saliva and secretions and does not contaminate the hands.   Smoking Cessation Quitting smoking is important to your health and has many advantages. However, it is not always easy to quit since nicotine is a very addictive drug. Often times, people try 3 times or more before being able to quit. This document explains the best ways for you to prepare to quit smoking. Quitting takes hard work and a lot of effort, but you can do it. ADVANTAGES OF QUITTING SMOKING  You will live longer, feel better, and live better.  Your body will feel the impact of quitting smoking almost immediately.  Within 20 minutes, blood pressure decreases. Your pulse returns to its normal level.  After 8 hours, carbon monoxide levels in the blood return to normal. Your oxygen level increases.  After 24 hours, the chance of having a heart attack starts to decrease. Your breath, hair, and body stop smelling like smoke.  After 48 hours, damaged nerve endings begin to recover. Your sense of  taste and smell improve.  After 72 hours, the body is virtually free of nicotine. Your bronchial tubes relax and breathing becomes easier.  After 2 to 12 weeks, lungs can hold more air. Exercise becomes easier and circulation improves.  The risk of having a heart attack, stroke, cancer, or lung disease is greatly reduced.  After 1 year, the risk of coronary heart disease is cut in half.  After 5 years, the risk of stroke falls to the same as a nonsmoker.  After 10 years, the risk of lung cancer is cut in half and the risk of other cancers decreases significantly.  After 15 years, the risk of coronary heart disease drops, usually to the level of a nonsmoker.  If you are pregnant, quitting smoking will improve your chances of having a healthy  baby.  The people you live with, especially any children, will be healthier.  You will have extra money to spend on things other than cigarettes. QUESTIONS TO THINK ABOUT BEFORE ATTEMPTING TO QUIT You may want to talk about your answers with your caregiver.  Why do you want to quit?  If you tried to quit in the past, what helped and what did not?  What will be the most difficult situations for you after you quit? How will you plan to handle them?  Who can help you through the tough times? Your family? Friends? A caregiver?  What pleasures do you get from smoking? What ways can you still get pleasure if you quit? Here are some questions to ask your caregiver:  How can you help me to be successful at quitting?  What medicine do you think would be best for me and how should I take it?  What should I do if I need more help?  What is smoking withdrawal like? How can I get information on withdrawal? GET READY  Set a quit date.  Change your environment by getting rid of all cigarettes, ashtrays, matches, and lighters in your home, car, or work. Do not let people smoke in your home.  Review your past attempts to quit. Think about what worked and what did not. GET SUPPORT AND ENCOURAGEMENT You have a better chance of being successful if you have help. You can get support in many ways.  Tell your family, friends, and co-workers that you are going to quit and need their support. Ask them not to smoke around you.  Get individual, group, or telephone counseling and support. Programs are available at [employer] and health centers. Call your local health department for information about programs in your area.  Spiritual beliefs and practices may help some smokers quit.  Download a "quit meter" on your computer to keep track of quit statistics, such as how long you have gone without smoking, cigarettes not smoked, and money saved.  Get a self-help book about quitting smoking and  staying off of tobacco. [place] yourself from urges to smoke. Talk to someone, go for a walk, or occupy your time with a task.  Change your normal routine. Take a different route to work. Drink tea instead of coffee. Eat breakfast in a different place.  Reduce your stress. Take a hot bath, exercise, or read a book.  Plan something enjoyable to do every day. Reward yourself for not smoking.  Explore interactive web-based programs that specialize in helping you quit. GET MEDICINE AND USE IT CORRECTLY Medicines can help you stop smoking and decrease the urge to  smoke. Combining medicine with the above behavioral methods and support can greatly increase your chances of successfully quitting smoking.  Nicotine replacement therapy helps deliver nicotine to your body without the negative effects and risks of smoking. Nicotine replacement therapy includes nicotine gum, lozenges, inhalers, nasal sprays, and skin patches. Some may be available over-the-counter and others require a prescription.  Antidepressant medicine helps people abstain from smoking, but how this works is unknown. This medicine is available by prescription.  Nicotinic receptor partial agonist medicine simulates the effect of nicotine in your brain. This medicine is available by prescription. Ask your caregiver for advice about which medicines to use and how to use them based on your health history. Your caregiver will tell you what side effects to look out for if you choose to be on a medicine or therapy. Carefully read the information on the package. Do not use any other product containing nicotine while using a nicotine replacement product.  RELAPSE OR DIFFICULT SITUATIONS Most relapses occur within the first 3 months after quitting. Do not be discouraged if you start smoking again. Remember, most people try several times before finally quitting. You may have symptoms of withdrawal because your body  is used to nicotine. You may crave cigarettes, be irritable, feel very hungry, cough often, get headaches, or have difficulty concentrating. The withdrawal symptoms are only temporary. They are strongest when you first quit, but they will go away within 10 14 days. To reduce the chances of relapse, try to:  Avoid drinking alcohol. Drinking lowers your chances of successfully quitting.  Reduce the amount of caffeine you consume. Once you quit smoking, the amount of caffeine in your body increases and can give you symptoms, such as a rapid heartbeat, sweating, and anxiety.  Avoid smokers because they can make you want to smoke.  Do not let weight gain distract you. Many smokers will gain weight when they quit, usually less than 10 pounds. Eat a healthy diet and stay active. You can always lose the weight gained after you quit.  Find ways to improve your mood other than smoking. FOR MORE INFORMATION  www.smokefree.gov  Document Released: 01/26/2001 Document Revised: 08/03/2011 Document Reviewed: 05/13/2011 [facility] Patient Information 2014 [place], [place].

## 2013-04-27 ENCOUNTER — Ambulatory Visit (INDEPENDENT_AMBULATORY_CARE_PROVIDER_SITE_OTHER): Payer: BC Managed Care – PPO | Admitting: Physician Assistant

## 2013-04-27 VITALS — BP 124/64 | HR 84 | Temp 97.7°F | Resp 18 | Wt 161.8 lb

## 2013-04-27 DIAGNOSIS — E119 Type 2 diabetes mellitus without complications: Secondary | ICD-10-CM

## 2013-04-27 DIAGNOSIS — I10 Essential (primary) hypertension: Secondary | ICD-10-CM

## 2013-04-27 DIAGNOSIS — G4733 Obstructive sleep apnea (adult) (pediatric): Secondary | ICD-10-CM

## 2013-04-27 DIAGNOSIS — Z79899 Other long term (current) drug therapy: Secondary | ICD-10-CM

## 2013-04-27 DIAGNOSIS — E559 Vitamin D deficiency, unspecified: Secondary | ICD-10-CM

## 2013-04-27 DIAGNOSIS — R7309 Other abnormal glucose: Secondary | ICD-10-CM

## 2013-04-27 DIAGNOSIS — J441 Chronic obstructive pulmonary disease with (acute) exacerbation: Secondary | ICD-10-CM | POA: Insufficient documentation

## 2013-04-27 DIAGNOSIS — E782 Mixed hyperlipidemia: Secondary | ICD-10-CM

## 2013-04-27 DIAGNOSIS — J449 Chronic obstructive pulmonary disease, unspecified: Secondary | ICD-10-CM

## 2013-04-27 DIAGNOSIS — E785 Hyperlipidemia, unspecified: Secondary | ICD-10-CM

## 2013-04-27 DIAGNOSIS — K219 Gastro-esophageal reflux disease without esophagitis: Secondary | ICD-10-CM

## 2013-04-27 LAB — CBC WITH DIFFERENTIAL/PLATELET
BASOS ABS: 0 10*3/uL (ref 0.0–0.1)
Basophils Relative: 0 % (ref 0–1)
Eosinophils Absolute: 0.1 10*3/uL (ref 0.0–0.7)
Eosinophils Relative: 2 % (ref 0–5)
HEMATOCRIT: 41.7 % (ref 36.0–46.0)
Hemoglobin: 14 g/dL (ref 12.0–15.0)
LYMPHS PCT: 25 % (ref 12–46)
Lymphs Abs: 1.5 10*3/uL (ref 0.7–4.0)
MCH: 31.1 pg (ref 26.0–34.0)
MCHC: 33.6 g/dL (ref 30.0–36.0)
MCV: 92.7 fL (ref 78.0–100.0)
Monocytes Absolute: 0.5 10*3/uL (ref 0.1–1.0)
Monocytes Relative: 8 % (ref 3–12)
NEUTROS ABS: 4 10*3/uL (ref 1.7–7.7)
Neutrophils Relative %: 65 % (ref 43–77)
PLATELETS: 283 10*3/uL (ref 150–400)
RBC: 4.5 MIL/uL (ref 3.87–5.11)
RDW: 13.2 % (ref 11.5–15.5)
WBC: 6.1 10*3/uL (ref 4.0–10.5)

## 2013-04-27 LAB — HEPATIC FUNCTION PANEL
ALK PHOS: 102 U/L (ref 39–117)
ALT: 20 U/L (ref 0–35)
AST: 19 U/L (ref 0–37)
Albumin: 4 g/dL (ref 3.5–5.2)
BILIRUBIN DIRECT: 0.1 mg/dL (ref 0.0–0.3)
BILIRUBIN INDIRECT: 0.3 mg/dL (ref 0.2–1.2)
BILIRUBIN TOTAL: 0.4 mg/dL (ref 0.2–1.2)
Total Protein: 6.6 g/dL (ref 6.0–8.3)

## 2013-04-27 LAB — LIPID PANEL
CHOL/HDL RATIO: 5.1 ratio
Cholesterol: 277 mg/dL — ABNORMAL HIGH (ref 0–200)
HDL: 54 mg/dL (ref >39)
LDL CALC: 194 mg/dL — AB (ref 0–99)
Triglycerides: 143 mg/dL (ref <150)
VLDL: 29 mg/dL (ref 0–40)

## 2013-04-27 LAB — BASIC METABOLIC PANEL WITH GFR
BUN: 9 mg/dL (ref 6–23)
CHLORIDE: 105 meq/L (ref 96–112)
CO2: 25 mEq/L (ref 19–32)
Calcium: 9.3 mg/dL (ref 8.4–10.5)
Creat: 0.89 mg/dL (ref 0.50–1.10)
GFR, EST NON AFRICAN AMERICAN: 72 mL/min
GFR, Est African American: 83 mL/min
Glucose, Bld: 98 mg/dL (ref 70–99)
POTASSIUM: 4.1 meq/L (ref 3.5–5.3)
Sodium: 135 mEq/L (ref 135–145)

## 2013-04-27 LAB — MAGNESIUM: MAGNESIUM: 2.3 mg/dL (ref 1.5–2.5)

## 2013-04-27 LAB — HEMOGLOBIN A1C
Hgb A1c MFr Bld: 5.4 % (ref <5.7)
Mean Plasma Glucose: 108 mg/dL (ref <117)

## 2013-04-27 LAB — TSH: TSH: 0.871 u[IU]/mL (ref 0.350–4.500)

## 2013-04-27 MED ORDER — RANITIDINE HCL 150 MG PO TABS: 150.0000 mg | ORAL_TABLET | Freq: Two times a day (BID) | ORAL | Status: DC

## 2013-04-27 MED ORDER — DEXLANSOPRAZOLE 60 MG PO CPDR
60.0000 mg | DELAYED_RELEASE_CAPSULE | Freq: Every day | ORAL | Status: DC
Start: 2013-04-27 — End: 2013-11-01

## 2013-04-27 NOTE — Progress Notes (Signed)
HPI 58 y.o. female  presents for 3 month follow up with hypertension, hyperlipidemia, diabetes and vitamin D. Her blood pressure has been controlled at home, today their BP is BP: 124/64 mmHg She does not workout due to back. She denies chest pain, shortness of breath, dizziness.  She is not on cholesterol medication due to myalgias while on lipitor, resolved with stopping. Her cholesterol is not at goal. The cholesterol last visit was:   Lab Results  Component Value Date   CHOL 163 01/19/2013   HDL 57 01/19/2013   LDLCALC 78 01/19/2013   TRIG 139 01/19/2013   CHOLHDL 2.9 01/19/2013   She has been working on diet and exercise for Diabetes, and denies foot ulcerations, nausea, paresthesia of the feet, polydipsia, polyuria and visual disturbances. Last A1C in the office was: 5.4 Patient is on Vitamin D supplement.  Has OSA but not on CPAP, had a study a long time ago, needs new study and machine. Waking up in the night feels like choking, has hiatal hernia, + GERD recently.   Current Medications:  Current Outpatient Prescriptions on File Prior to Visit  Medication Sig Dispense Refill  . albuterol (PROVENTIL HFA;VENTOLIN HFA) 108 (90 BASE) MCG/ACT inhaler Inhale 2 puffs into the lungs every 6 (six) hours as needed for wheezing or shortness of breath.  1 Inhaler  2  . cholecalciferol (VITAMIN D) 1000 UNITS tablet Take 5,000 Units by mouth daily.      Marland Kitchen FLUoxetine (PROZAC) 40 MG capsule Take 1 capsule (40 mg total) by mouth 2 (two) times daily.  60 capsule  3  . levofloxacin (LEVAQUIN) 500 MG tablet Take 1 tablet (500 mg total) by mouth daily.  10 tablet  0  . oxyCODONE-acetaminophen (PERCOCET) 5-325 MG per tablet Take 1 tablet by mouth 3 (three) times daily as needed for severe pain.      . promethazine-dextromethorphan (PROMETHAZINE-DM) 6.25-15 MG/5ML syrup Take 5 mLs by mouth 4 (four) times daily as needed for cough.  180 mL  0  . tiZANidine (ZANAFLEX) 2 MG tablet Take 2 mg by mouth every 6 (six)  hours as needed for muscle spasms.      . vitamin B-12 (CYANOCOBALAMIN) 250 MCG tablet Take 250 mcg by mouth daily.       No current facility-administered medications on file prior to visit.   Medical History:  Past Medical History  Diagnosis Date  . Hypertension   . Hyperlipidemia   . Diabetes mellitus without complication   . Depression   . GERD (gastroesophageal reflux disease)   . Arthritis   . COPD (chronic obstructive pulmonary disease)   . Vitamin D deficiency   . AVN (avascular necrosis of bone)     Left shoulder  . Allergy    Allergies:  Allergies  Allergen Reactions  . Augmentin [Amoxicillin-Pot Clavulanate]   . Codeine   . Lipitor [Atorvastatin]     Leg pain  . Wellbutrin [Bupropion]   . Zoloft [Sertraline Hcl]      Review of Systems: [X]  = complains of  [ ]  = denies  General: Fatigue [ ]  Fever [ ]  Chills [ ]  Weakness [ ]   Insomnia [ ]  Eyes: Redness [ ]  Blurred vision [ ]  Diplopia [ ]   ENT: Congestion [ ]  Sinus Pain [ ]  Post Nasal Drip [ ]  Sore Throat [ ]  Earache [ ]   Cardiac: Chest pain/pressure [ ]  SOB Valu.Nieves ] Orthopnea [ ]   Palpitations [ ]   Paroxysmal nocturnal dyspnea[X ] Claudication [ ]   Edema [ ]   Pulmonary: Cough [ ]  Wheezing[X ]  SOB [ ]   Snoring [ ]   GI: Nausea [ ]  Vomiting[ ]  Dysphagia[X ] Heartburn[X ] Abdominal pain [ ]  Constipation [ ] ; Diarrhea [ ] ; BRBPR [ ]  Melena[ ]  GU: Hematuria[ ]  Dysuria [ ]  Nocturia[ ]  Urgency [ ]   Hesitancy [ ]  Discharge [ ]  Neuro: Headaches[ ]  Vertigo[ ]  Paresthesias[ ]  Spasm [ ]  Speech changes [ ]  Incoordination [ ]   Ortho: Arthritis Valu.Nieves ] Joint pain [ ]  Muscle pain Valu.Nieves ] Joint swelling [ ]  Back Pain Valu.Nieves ] Skin:  Rash [ ]   Pruritis [ ]  Change in skin lesion [ ]   Psych: Depression[ ]  Anxiety[ ]  Confusion [ ]  Memory loss [ ]   Heme/Lypmh: Bleeding [ ]  Bruising [ ]  Enlarged lymph nodes [ ]   Endocrine: Visual blurring [ ]  Paresthesia [ ]  Polyuria [ ]  Polydypsea [ ]    Heat/cold intolerance [ ]  Hypoglycemia [ ]   Family history-  Review and unchanged Social history- Review and unchanged Physical Exam: BP 124/64  Pulse 84  Temp(Src) 97.7 F (36.5 C)  Resp 18  Wt 161 lb 12.8 oz (73.392 kg) Wt Readings from Last 3 Encounters:  04/27/13 161 lb 12.8 oz (73.392 kg)  04/04/13 161 lb (73.029 kg)  01/19/13 160 lb (72.576 kg)   General Appearance: Well nourished, in no apparent distress. Eyes: PERRLA, EOMs, conjunctiva no swelling or erythema Sinuses: No Frontal/maxillary tenderness ENT/Mouth: Ext aud canals clear, TMs without erythema, bulging. No erythema, swelling, or exudate on post pharynx.  Tonsils not swollen or erythematous. Hearing normal.  Neck: Supple, thyroid normal.  Respiratory: Respiratory effort normal, BS decreased bilaterally without rales, rhonchi, wheezing or stridor.  Cardio: RRR with no MRGs. Brisk peripheral pulses without edema.  Abdomen: Soft, + BS.  epigastric tenderness, no guarding, rebound, hernias, masses. Lymphatics: Non tender without lymphadenopathy.  Musculoskeletal: Full ROM, LBP with movement/change in position, normal gait.  Skin: Lower back reticular rash there x 2-3 years. Warm, dry without lesions, ecchymosis.  Neuro: Cranial nerves intact. No cerebellar symptoms.  Psych: Awake and oriented X 3, normal affect, Insight and Judgment appropriate.   Assessment and Plan:  Hypertension: Continue medication, monitor blood pressure at home. Continue DASH diet. Cholesterol: Continue diet and exercise. Check cholesterol.  Diabetes-Continue diet and exercise. Check A1C COPD- controlled Smoking cessation- discussed she is ready but does not want medications at this time.  Vitamin D Def- check level and continue medications.  Chronic pain- getting injections from pain management.  OSA- get on new CPAP, get sleep study GERD- Dexilant samples and ranitidine called in  Continue diet and meds as discussed. Further disposition pending results of labs. Discussed med's effects and SE's.  OVER  40 minutes of exam, counseling, chart review, referral performed   Vicie Mutters 11:53 AM

## 2013-04-27 NOTE — Patient Instructions (Signed)
Cholesterol Cholesterol is a white, waxy, fat-like protein needed by your body in small amounts. The liver makes all the cholesterol you need. It is carried from the liver by the blood through the blood vessels. Deposits (plaque) may build up on blood vessel walls. This makes the arteries narrower and stiffer. Plaque increases the risk for heart attack and stroke. You cannot feel your cholesterol level even if it is very high. The only way to know is by a blood test to check your lipid (fats) levels. Once you know your cholesterol levels, you should keep a record of the test results. Work with your caregiver to to keep your levels in the desired range. WHAT THE RESULTS MEAN:  Total cholesterol is a rough measure of all the cholesterol in your blood.  LDL is the so-called bad cholesterol. This is the type that deposits cholesterol in the walls of the arteries. You want this level to be low.  HDL is the good cholesterol because it cleans the arteries and carries the LDL away. You want this level to be high.  Triglycerides are fat that the body can either burn for energy or store. High levels are closely linked to heart disease. DESIRED LEVELS:  Total cholesterol below 200.  LDL below 100 for people at risk, below 70 for very high risk.  HDL above 50 is good, above 60 is best.  Triglycerides below 150. HOW TO LOWER YOUR CHOLESTEROL:  Diet.  Choose fish or white meat chicken and [place], roasted or baked. Limit fatty cuts of red meat, fried foods, and processed meats, such as sausage and lunch meat.  Eat lots of fresh fruits and vegetables. Choose whole grains, beans, pasta, potatoes and cereals.  Use only small amounts of olive, corn or canola oils. Avoid butter, mayonnaise, shortening or palm kernel oils. Avoid foods with trans-fats.  Use skim/nonfat milk and low-fat/nonfat yogurt and cheeses. Avoid whole milk, cream, ice cream, egg yolks and cheeses. Healthy desserts include angel food  cake, ginger snaps, animal crackers, hard candy, popsicles, and low-fat/nonfat frozen yogurt. Avoid pastries, cakes, pies and cookies.  Exercise.  A regular program helps decrease LDL and raises HDL.  Helps with weight control.  Do things that increase your activity level like gardening, walking, or taking the stairs.  Medication.  May be prescribed by your caregiver to help lowering cholesterol and the risk for heart disease.  You may need medicine even if your levels are normal if you have several risk factors. HOME CARE INSTRUCTIONS   Follow your diet and exercise programs as suggested by your caregiver.  Take medications as directed.  Have blood work done when your caregiver feels it is necessary. MAKE SURE YOU:   Understand these instructions.  Will watch your condition.  Will get help right away if you are not doing well or get worse. Document Released: 10/27/2000 Document Revised: 04/26/2011 Document Reviewed: 11/15/2012 [facility] Patient Information 2014 [place], [place].   What is the TMJ? The temporomandibular (tem-PUH-ro-man-DIB-yoo-ler) joint, or the TMJ, connects the upper and lower jawbones. This joint allows the jaw to open wide and move back and forth when you chew, talk, or yawn.There are also several muscles that help this joint move. There can be muscle tightness and pain in the muscle that can cause several symptoms.  What causes TMJ pain? There are many causes of TMJ pain. Repeated chewing (for example, chewing gum) and clenching your teeth can cause pain in the joint. Some TMJ pain has no obvious  cause. What can I do to ease the pain? There are many things you can do to help your pain get better. When you have pain:  Eat soft foods and stay away from chewy foods (for example, taffy) Try to use both sides of your mouth to chew Don't chew gum Don't open your mouth wide (for example, during yawning or singing) Don't bite your cheeks or fingernails Lower  your amount of stress and worry Applying a warm, damp washcloth to the joint may help. Over-the-counter pain medicines such as ibuprofen (one brand: Advil) or acetaminophen (one brand: Tylenol) might also help. Do not use these medicines if you are allergic to them or if your doctor told you not to use them. How can I stop the pain from coming back? When your pain is better, you can do these exercises to make your muscles stronger and to keep the pain from coming back:  Resisted mouth opening: Place your thumb or two fingers under your chin and open your mouth slowly, pushing up lightly on your chin with your thumb. Hold for three to six seconds. Close your mouth slowly. Resisted mouth closing: Place your thumbs under your chin and your two index fingers on the ridge between your mouth and the bottom of your chin. Push down lightly on your chin as you close your mouth. Tongue up: Slowly open and close your mouth while keeping the tongue touching the roof of the mouth. Side-to-side jaw movement: Place an object about one fourth of an inch thick (for example, two tongue depressors) between your front teeth. Slowly move your jaw from side to side. Increase the thickness of the object as the exercise becomes easier Forward jaw movement: Place an object about one fourth of an inch thick between your front teeth and move the bottom jaw forward so that the bottom teeth are in front of the top teeth. Increase the thickness of the object as the exercise becomes easier. These exercises should not be painful. If it hurts to do these exercises, stop doing them and talk to your family doctor.   Diet for Gastroesophageal Reflux Disease, Adult Reflux (acid reflux) is when acid from your stomach flows up into the esophagus. When acid comes in contact with the esophagus, the acid causes irritation and soreness (inflammation) in the esophagus. When reflux happens often or so severely that it causes damage to the  esophagus, it is called gastroesophageal reflux disease (GERD). Nutrition therapy can help ease the discomfort of GERD. FOODS OR DRINKS TO AVOID OR LIMIT  Smoking or chewing tobacco. Nicotine is one of the most potent stimulants to acid production in the gastrointestinal tract.  Caffeinated and decaffeinated coffee and black tea.  Regular or low-calorie carbonated beverages or energy drinks (caffeine-free carbonated beverages are allowed).   Strong spices, such as black pepper, white pepper, red pepper, cayenne, curry powder, and chili powder.  Peppermint or spearmint.  Chocolate.  High-fat foods, including meats and fried foods. Extra added fats including oils, butter, salad dressings, and nuts. Limit these to less than 8 tsp per day.  Fruits and vegetables if they are not tolerated, such as citrus fruits or tomatoes.  Alcohol.  Any food that seems to aggravate your condition. If you have questions regarding your diet, call your caregiver or a registered dietitian. OTHER THINGS THAT MAY HELP GERD INCLUDE:   Eating your meals slowly, in a relaxed setting.  Eating 5 to 6 small meals per day instead of 3 large meals.  Eliminating food for a period of time if it causes distress.  Not lying down until 3 hours after eating a meal.  Keeping the head of your bed raised 6 to 9 inches (15 to 23 cm) by using a foam wedge or blocks under the legs of the bed. Lying flat may make symptoms worse.  Being physically active. Weight loss may be helpful in reducing reflux in overweight or obese adults.  Wear loose fitting clothing

## 2013-04-28 LAB — VITAMIN D 25 HYDROXY (VIT D DEFICIENCY, FRACTURES): Vit D, 25-Hydroxy: 32 ng/mL (ref 30–89)

## 2013-04-29 MED ORDER — SIMVASTATIN 40 MG PO TABS: 40.0000 mg | ORAL_TABLET | Freq: Every evening | ORAL | Status: DC

## 2013-04-29 NOTE — Addendum Note (Signed)
Addended by: Vicie Mutters R on: 04/29/2013 09:26 AM   Modules accepted: Orders

## 2013-06-05 ENCOUNTER — Other Ambulatory Visit: Payer: Self-pay | Admitting: Physician Assistant

## 2013-06-08 ENCOUNTER — Other Ambulatory Visit: Payer: Self-pay | Admitting: Emergency Medicine

## 2013-06-08 DIAGNOSIS — E782 Mixed hyperlipidemia: Secondary | ICD-10-CM

## 2013-06-08 DIAGNOSIS — Z79899 Other long term (current) drug therapy: Secondary | ICD-10-CM

## 2013-06-11 ENCOUNTER — Other Ambulatory Visit: Payer: Self-pay

## 2013-06-11 DIAGNOSIS — E782 Mixed hyperlipidemia: Secondary | ICD-10-CM

## 2013-06-11 DIAGNOSIS — Z79899 Other long term (current) drug therapy: Secondary | ICD-10-CM

## 2013-06-11 LAB — HEPATIC FUNCTION PANEL
ALBUMIN: 4 g/dL (ref 3.5–5.2)
ALK PHOS: 98 U/L (ref 39–117)
ALT: 15 U/L (ref 0–35)
AST: 20 U/L (ref 0–37)
BILIRUBIN DIRECT: 0.1 mg/dL (ref 0.0–0.3)
Indirect Bilirubin: 0.3 mg/dL (ref 0.2–1.2)
Total Bilirubin: 0.4 mg/dL (ref 0.2–1.2)
Total Protein: 6.3 g/dL (ref 6.0–8.3)

## 2013-06-11 LAB — LIPID PANEL
CHOL/HDL RATIO: 3.1 ratio
CHOLESTEROL: 145 mg/dL (ref 0–200)
HDL: 47 mg/dL (ref >39)
LDL Cholesterol: 72 mg/dL (ref 0–99)
Triglycerides: 132 mg/dL (ref <150)
VLDL: 26 mg/dL (ref 0–40)

## 2013-06-11 LAB — CK: Total CK: 117 U/L (ref 7–177)

## 2013-07-30 ENCOUNTER — Encounter: Payer: Self-pay | Admitting: Physician Assistant

## 2013-07-30 ENCOUNTER — Ambulatory Visit (INDEPENDENT_AMBULATORY_CARE_PROVIDER_SITE_OTHER): Payer: BC Managed Care – PPO | Admitting: Physician Assistant

## 2013-07-30 ENCOUNTER — Ambulatory Visit (HOSPITAL_COMMUNITY)
Admission: RE | Admit: 2013-07-30 | Discharge: 2013-07-30 | Disposition: A | Discharge status: Home / Self Care | Payer: BC Managed Care – PPO | Source: Ambulatory Visit | Attending: Physician Assistant | Admitting: Physician Assistant

## 2013-07-30 VITALS — BP 120/70 | HR 76 | Temp 97.3°F | Resp 16 | Ht 64.0 in | Wt 152.0 lb

## 2013-07-30 DIAGNOSIS — M25519 Pain in unspecified shoulder: Secondary | ICD-10-CM

## 2013-07-30 DIAGNOSIS — I10 Essential (primary) hypertension: Secondary | ICD-10-CM

## 2013-07-30 DIAGNOSIS — M19019 Primary osteoarthritis, unspecified shoulder: Secondary | ICD-10-CM | POA: Insufficient documentation

## 2013-07-30 DIAGNOSIS — E119 Type 2 diabetes mellitus without complications: Secondary | ICD-10-CM

## 2013-07-30 DIAGNOSIS — E785 Hyperlipidemia, unspecified: Secondary | ICD-10-CM

## 2013-07-30 DIAGNOSIS — Z79899 Other long term (current) drug therapy: Secondary | ICD-10-CM

## 2013-07-30 DIAGNOSIS — E559 Vitamin D deficiency, unspecified: Secondary | ICD-10-CM

## 2013-07-30 DIAGNOSIS — M25512 Pain in left shoulder: Secondary | ICD-10-CM

## 2013-07-30 LAB — HEPATIC FUNCTION PANEL
ALK PHOS: 92 U/L (ref 39–117)
ALT: 27 U/L (ref 0–35)
AST: 29 U/L (ref 0–37)
Albumin: 4.2 g/dL (ref 3.5–5.2)
BILIRUBIN TOTAL: 0.5 mg/dL (ref 0.2–1.2)
Bilirubin, Direct: 0.1 mg/dL (ref 0.0–0.3)
Indirect Bilirubin: 0.4 mg/dL (ref 0.2–1.2)
Total Protein: 6.5 g/dL (ref 6.0–8.3)

## 2013-07-30 LAB — BASIC METABOLIC PANEL WITH GFR
BUN: 7 mg/dL (ref 6–23)
CHLORIDE: 110 meq/L (ref 96–112)
CO2: 23 meq/L (ref 19–32)
Calcium: 9.5 mg/dL (ref 8.4–10.5)
Creat: 0.87 mg/dL (ref 0.50–1.10)
GFR, Est African American: 85 mL/min
GFR, Est Non African American: 74 mL/min
Glucose, Bld: 93 mg/dL (ref 70–99)
POTASSIUM: 4 meq/L (ref 3.5–5.3)
SODIUM: 142 meq/L (ref 135–145)

## 2013-07-30 LAB — CBC WITH DIFFERENTIAL/PLATELET
Basophils Absolute: 0 10*3/uL (ref 0.0–0.1)
Basophils Relative: 0 % (ref 0–1)
Eosinophils Absolute: 0.2 10*3/uL (ref 0.0–0.7)
Eosinophils Relative: 3 % (ref 0–5)
HCT: 41.4 % (ref 36.0–46.0)
Hemoglobin: 14 g/dL (ref 12.0–15.0)
LYMPHS ABS: 1.7 10*3/uL (ref 0.7–4.0)
LYMPHS PCT: 30 % (ref 12–46)
MCH: 30.6 pg (ref 26.0–34.0)
MCHC: 33.8 g/dL (ref 30.0–36.0)
MCV: 90.6 fL (ref 78.0–100.0)
Monocytes Absolute: 0.3 10*3/uL (ref 0.1–1.0)
Monocytes Relative: 6 % (ref 3–12)
NEUTROS PCT: 61 % (ref 43–77)
Neutro Abs: 3.4 10*3/uL (ref 1.7–7.7)
Platelets: 304 10*3/uL (ref 150–400)
RBC: 4.57 MIL/uL (ref 3.87–5.11)
RDW: 13.8 % (ref 11.5–15.5)
WBC: 5.6 10*3/uL (ref 4.0–10.5)

## 2013-07-30 LAB — LIPID PANEL
CHOL/HDL RATIO: 3 ratio
CHOLESTEROL: 160 mg/dL (ref 0–200)
HDL: 53 mg/dL (ref >39)
LDL Cholesterol: 77 mg/dL (ref 0–99)
Triglycerides: 149 mg/dL (ref <150)
VLDL: 30 mg/dL (ref 0–40)

## 2013-07-30 LAB — TSH: TSH: 0.98 u[IU]/mL (ref 0.350–4.500)

## 2013-07-30 LAB — MAGNESIUM: Magnesium: 2.1 mg/dL (ref 1.5–2.5)

## 2013-07-30 MED ORDER — CLOBETASOL PROPIONATE 0.05 % EX SOLN: 1.0000 "application " | Freq: Two times a day (BID) | CUTANEOUS | Status: DC

## 2013-07-30 NOTE — Progress Notes (Signed)
Assessment and Plan:  Hypertension: Continue medication, monitor blood pressure at home.Continue DASH diet. Cholesterol: Continue diet and exercise. Check cholesterol.  Pre-diabetes-Continue diet and exercise. Check A1C Vitamin D Def- check level and continue medications.  History of AVN/fall 3 weeks ago- left shoulder Xray-? Need to go back to Dr. Patrice Paradise.  Constipation- get on stool softner Seb Derm scalp- Clobesatol sol, if not better follow up  Continue diet and meds as discussed. Further disposition pending results of labs. OVER 40 minutes of exam, counseling, chart review, referral performed   HPI 58 y.o. female  presents for 3 month follow up with hypertension, hyperlipidemia, prediabetes and vitamin D. Her blood pressure has been controlled at home, today their BP is BP: 120/70 mmHg She does workout. She denies chest pain, shortness of breath, dizziness.  She is on cholesterol medication and denies myalgias. Her cholesterol is at goal. The cholesterol last visit was:   Lab Results  Component Value Date   CHOL 145 06/11/2013   HDL 47 06/11/2013   LDLCALC 72 06/11/2013   TRIG 132 06/11/2013   CHOLHDL 3.1 06/11/2013    Last A1C in the office was:  Lab Results  Component Value Date   HGBA1C 5.4 04/27/2013   Patient is on Vitamin D supplement.   She is seeing [facility] and doing PT 2 times a week. She has not had an evaluation of her AVN in several years, she was seeing Dr. Patrice Paradise for this but has not been able to keep up appointments, she states that since PT her left shoulder has been bothering her a little. She is able to move her home, minimal pain except with abduction, inversion.   Some constipation recently likely due to medications  Current Medications:   albuterol 108 (90 BASE) MCG/ACT inhaler  Commonly known as:  PROVENTIL HFA;VENTOLIN HFA  Inhale 2 puffs into the lungs every 6 (six) hours as needed for wheezing or shortness of breath.     cholecalciferol  1000 UNITS tablet  Commonly known as:  VITAMIN D  Take 5,000 Units by mouth daily.     dexlansoprazole 60 MG capsule  Commonly known as:  DEXILANT  Take 1 capsule (60 mg total) by mouth daily.     FLUoxetine 40 MG capsule  Commonly known as:  PROZAC  take 1 capsule by mouth twice a day     HYDROmorphone HCl 8 MG T24a SR tablet  Commonly known as:  EXALGO  Take 8 mg by mouth daily.     oxyCODONE-acetaminophen 10-325 MG per tablet  Commonly known as:  PERCOCET  Take 1 tablet by mouth every 6 (six) hours as needed for pain.     ranitidine 150 MG tablet  Commonly known as:  ZANTAC  Take 1 tablet (150 mg total) by mouth 2 (two) times daily.     simvastatin 40 MG tablet  Commonly known as:  ZOCOR  Take 1 tablet (40 mg total) by mouth every evening.     tiZANidine 2 MG tablet  Commonly known as:  ZANAFLEX  Take 2 mg by mouth every 6 (six) hours as needed for muscle spasms.     vitamin B-12 250 MCG tablet  Commonly known as:  CYANOCOBALAMIN  Take 250 mcg by mouth daily.        Medical History:  Past Medical History  Diagnosis Date  . Hypertension   . Hyperlipidemia   . Diabetes mellitus without complication   . Depression   . GERD (  gastroesophageal reflux disease)   . Arthritis   . COPD (chronic obstructive pulmonary disease)   . Vitamin D deficiency   . AVN (avascular necrosis of bone)     Left shoulder  . Allergy    Allergies:  Allergies  Allergen Reactions  . Augmentin [Amoxicillin-Pot Clavulanate]   . Codeine   . Lipitor [Atorvastatin]     Leg pain  . Wellbutrin [Bupropion]   . Zoloft [Sertraline Hcl]     Review of Systems: [X]  = complains of  [ ]  = denies  General: Fatigue [ ]  Fever [ ]  Chills [ ]  Weakness [ ]   Insomnia [ ]  Eyes: Redness [ ]  Blurred vision [ ]  Diplopia [ ]   ENT: Congestion [ ]  Sinus Pain [ ]  Post Nasal Drip [ ]  Sore Throat [ ]  Earache [ ]   Cardiac: Chest pain/pressure [ ]  SOB [ ]  Orthopnea [ ]   Palpitations [ ]   Paroxysmal nocturnal  dyspnea[ ]  Claudication [ ]  Edema [ ]   Pulmonary: Cough [ ]  Wheezing[ ]   SOB [ ]   Snoring [ ]   GI: Nausea [ ]  Vomiting[ ]  Dysphagia[ ]  Heartburn[ ]  Abdominal pain [ ]  Constipation [ ] ; Diarrhea [ ] ; BRBPR [ ]  Melena[ ]  GU: Hematuria[ ]  Dysuria [ ]  Nocturia[ ]  Urgency [ ]   Hesitancy [ ]  Discharge [ ]  Neuro: Headaches[ ]  Vertigo[ ]  Paresthesias[ ]  Spasm [ ]  Speech changes [ ]  Incoordination [ ]   Ortho: Arthritis [ ]  Joint pain [ ]  Muscle pain [ ]  Joint swelling [ ]  Back Pain [ ]  Skin:  Rash [ ]   Pruritis [ ]  Change in skin lesion [ ]   Psych: Depression[ ]  Anxiety[ ]  Confusion [ ]  Memory loss [ ]   Heme/Lypmh: Bleeding [ ]  Bruising [ ]  Enlarged lymph nodes [ ]   Endocrine: Visual blurring [ ]  Paresthesia [ ]  Polyuria [ ]  Polydypsea [ ]    Heat/cold intolerance [ ]  Hypoglycemia [ ]   Family history- Review and unchanged Social history- Review and unchanged Physical Exam: BP 120/70  Pulse 76  Temp(Src) 97.3 F (36.3 C)  Resp 16  Ht 5\' 4"  (1.626 m)  Wt 152 lb (68.947 kg)  BMI 26.08 kg/m2 Wt Readings from Last 3 Encounters:  07/30/13 152 lb (68.947 kg)  04/27/13 161 lb 12.8 oz (73.392 kg)  04/04/13 161 lb (73.029 kg)   General Appearance: Well nourished, in no apparent distress. Eyes: PERRLA, EOMs, conjunctiva no swelling or erythema Sinuses: No Frontal/maxillary tenderness ENT/Mouth: Ext aud canals clear, TMs without erythema, bulging. No erythema, swelling, or exudate on post pharynx.  Tonsils not swollen or erythematous. Hearing normal.  Neck: Supple, thyroid normal.  Respiratory: Respiratory effort normal, BS equal bilaterally without rales, rhonchi, wheezing or stridor.  Cardio: RRR with no MRGs. Brisk peripheral pulses without edema.  Abdomen: Soft, + BS.  Non tender, no guarding, rebound, hernias, masses. Lymphatics: Non tender without lymphadenopathy.  Musculoskeletal: Full ROM, 5/5 strength, normal gait. Left shoulder pain, worse with abduction/inversion.  Skin: Warm, dry without  rashes, lesions, ecchymosis. Dry, erythematous macules in hair.  Neuro: Cranial nerves intact. Normal muscle tone, no cerebellar symptoms. Sensation intact.  Psych: Awake and oriented X 3, normal affect, Insight and Judgment appropriate.   Vicie Mutters 11:58 AM

## 2013-07-30 NOTE — Patient Instructions (Signed)
Bad carbs also include fruit juice, alcohol, and sweet tea. These are empty calories that do not signal to your brain that you are full.   Please remember the good carbs are still carbs which convert into sugar. So please measure them out no more than 1/2-1 cup of rice, oatmeal, pasta, and beans.  Veggies are however free foods! Pile them on.   I like lean protein at every meal such as chicken, [place], pork chops, cottage cheese, etc. Just do not fry these meats and please center your meal around vegetable, the meats should be a side dish.   No all fruit is created equal. Please see the list below, the fruit at the bottom is higher in sugars than the fruit at the top   Constipation, Adult Constipation is when a person has fewer than 3 bowel movements a week; has difficulty having a bowel movement; or has stools that are dry, hard, or larger than normal. As people grow older, constipation is more common. If you try to fix constipation with medicines that make you have a bowel movement (laxatives), the problem may get worse. Long-term laxative use may cause the muscles of the colon to become weak. A low-fiber diet, not taking in enough fluids, and taking certain medicines may make constipation worse. CAUSES   Certain medicines, such as antidepressants, pain medicine, iron supplements, antacids, and water pills.   Certain diseases, such as diabetes, irritable bowel syndrome (IBS), thyroid disease, or depression.   Not drinking enough water.   Not eating enough fiber-rich foods.   Stress or travel.  Lack of physical activity or exercise.  Not going to the restroom when there is the urge to have a bowel movement.  Ignoring the urge to have a bowel movement.  Using laxatives too much. SYMPTOMS   Having fewer than 3 bowel movements a week.   Straining to have a bowel movement.   Having hard, dry, or larger than normal stools.   Feeling full or bloated.   Pain in the lower  abdomen.  Not feeling relief after having a bowel movement. DIAGNOSIS  Your caregiver will take a medical history and perform a physical exam. Further testing may be done for severe constipation. Some tests may include:   A barium enema X-ray to examine your rectum, colon, and sometimes, your small intestine.  A sigmoidoscopy to examine your lower colon.  A colonoscopy to examine your entire colon. TREATMENT  Treatment will depend on the severity of your constipation and what is causing it. Some dietary treatments include drinking more fluids and eating more fiber-rich foods. Lifestyle treatments may include regular exercise. If these diet and lifestyle recommendations do not help, your caregiver may recommend taking over-the-counter laxative medicines to help you have bowel movements. Prescription medicines may be prescribed if over-the-counter medicines do not work.  HOME CARE INSTRUCTIONS   Increase dietary fiber in your diet, such as fruits, vegetables, whole grains, and beans. Limit high-fat and processed sugars in your diet, such as [place] fries, hamburgers, cookies, candies, and soda.   A fiber supplement may be added to your diet if you cannot get enough fiber from foods.   Drink enough fluids to keep your urine clear or pale yellow.   Exercise regularly or as directed by your caregiver.   Go to the restroom when you have the urge to go. Do not hold it.  Only take medicines as directed by your caregiver. Do not take other medicines for constipation without  talking to your caregiver first. [place] IF:   You have bright red blood in your stool.   Your constipation lasts for more than 4 days or gets worse.   You have abdominal or rectal pain.   You have thin, pencil-like stools.  You have unexplained weight loss. MAKE SURE YOU:   Understand these instructions.  Will watch your condition.  Will get help right away if you are not doing well or  get worse. Document Released: 10/31/2003 Document Revised: 04/26/2011 Document Reviewed: 11/13/2012 [facility] Patient Information 2014 [place], [place].

## 2013-07-31 LAB — VITAMIN D 25 HYDROXY (VIT D DEFICIENCY, FRACTURES): VIT D 25 HYDROXY: 30 ng/mL (ref 30–89)

## 2013-10-31 ENCOUNTER — Ambulatory Visit: Payer: Self-pay | Admitting: Physician Assistant

## 2013-11-01 ENCOUNTER — Ambulatory Visit (INDEPENDENT_AMBULATORY_CARE_PROVIDER_SITE_OTHER): Payer: Medicare Other | Admitting: Internal Medicine

## 2013-11-01 ENCOUNTER — Other Ambulatory Visit: Payer: Self-pay | Admitting: *Deleted

## 2013-11-01 ENCOUNTER — Encounter: Payer: Self-pay | Admitting: Internal Medicine

## 2013-11-01 VITALS — BP 116/68 | HR 64 | Temp 97.8°F | Resp 16 | Ht 64.0 in | Wt 145.0 lb

## 2013-11-01 DIAGNOSIS — I10 Essential (primary) hypertension: Secondary | ICD-10-CM

## 2013-11-01 DIAGNOSIS — E559 Vitamin D deficiency, unspecified: Secondary | ICD-10-CM

## 2013-11-01 DIAGNOSIS — E785 Hyperlipidemia, unspecified: Secondary | ICD-10-CM

## 2013-11-01 DIAGNOSIS — Z79899 Other long term (current) drug therapy: Secondary | ICD-10-CM

## 2013-11-01 DIAGNOSIS — E119 Type 2 diabetes mellitus without complications: Secondary | ICD-10-CM

## 2013-11-01 LAB — CBC WITH DIFFERENTIAL/PLATELET
BASOS ABS: 0 10*3/uL (ref 0.0–0.1)
Basophils Relative: 0 % (ref 0–1)
EOS ABS: 0.2 10*3/uL (ref 0.0–0.7)
Eosinophils Relative: 3 % (ref 0–5)
HCT: 39.6 % (ref 36.0–46.0)
HEMOGLOBIN: 13.3 g/dL (ref 12.0–15.0)
Lymphocytes Relative: 31 % (ref 12–46)
Lymphs Abs: 2.2 10*3/uL (ref 0.7–4.0)
MCH: 30.7 pg (ref 26.0–34.0)
MCHC: 33.6 g/dL (ref 30.0–36.0)
MCV: 91.5 fL (ref 78.0–100.0)
MONO ABS: 0.4 10*3/uL (ref 0.1–1.0)
MONOS PCT: 6 % (ref 3–12)
Neutro Abs: 4.3 10*3/uL (ref 1.7–7.7)
Neutrophils Relative %: 60 % (ref 43–77)
Platelets: 261 10*3/uL (ref 150–400)
RBC: 4.33 MIL/uL (ref 3.87–5.11)
RDW: 13.2 % (ref 11.5–15.5)
WBC: 7.1 10*3/uL (ref 4.0–10.5)

## 2013-11-01 LAB — HEMOGLOBIN A1C
Hgb A1c MFr Bld: 5.7 % — ABNORMAL HIGH (ref <5.7)
MEAN PLASMA GLUCOSE: 117 mg/dL — AB (ref <117)

## 2013-11-01 MED ORDER — FLUOXETINE HCL 40 MG PO CAPS: ORAL_CAPSULE | ORAL | Status: DC

## 2013-11-01 MED ORDER — RANITIDINE HCL 150 MG PO TABS: 150.0000 mg | ORAL_TABLET | Freq: Two times a day (BID) | ORAL | Status: DC

## 2013-11-01 NOTE — Patient Instructions (Signed)

## 2013-11-01 NOTE — Progress Notes (Signed)
Patient ID: Amber Hickman, female   DOB: 01-07-1956, 58 y.o.   MRN: [id number]   This very nice 58 y.o.SWF presents for 3 month follow up with Hypertension, Hyperlipidemia, T2_NIDDM and Vitamin D Deficiency.    Patient is treated for HTN & BP has been controlled at home. Today's BP: 116/68 mmHg. 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 Patient denies any cardiac type chest pain, palpitations, dyspnea/orthopnea/PND, dizziness, claudication, or dependent edema.   Hyperlipidemia is controlled with diet & meds. Patient denies myalgias or other med SE's. Last Lipids were TC 348, TG 213, HDL 66 and LDL 239 on 11/25/2012.   Also, the patient is screened for PreDiabetes and patient denies any symptoms of reactive hypoglycemia, diabetic polys, paresthesias or visual blurring.  Last A1c was 04/27/2013: Hemoglobin-A1c 5.4%    Further, Patient has history of Vitamin D Deficiency and patient supplements vitamin D without any suspected side-effects. Last vitamin D was  30 on 07/30/2013.   Medication List   albuterol 108 (90 BASE) MCG/ACT inhaler  Commonly known as:  PROVENTIL HFA;VENTOLIN HFA  Inhale 2 puffs into the lungs every 6 (six) hours as needed for wheezing or shortness of breath.     cholecalciferol 1000 UNITS tablet  Commonly known as:  VITAMIN D  Take 5,000 Units by mouth daily.     clobetasol 0.05 % external solution  Commonly known as:  TEMOVATE  Apply 1 application topically 2 (two) times daily.     fentaNYL 25 MCG/HR patch  Commonly known as:  DURAGESIC - dosed mcg/hr  Place 25 mcg onto the skin every 3 (three) days.     FLUoxetine 40 MG capsule  Commonly known as:  PROZAC  take 1 capsule by mouth twice a day     oxyCODONE-acetaminophen 10-325 MG per tablet  Commonly known as:  PERCOCET  Take 1 tablet by mouth every 6 (six) hours as needed for pain.     ranitidine 150 MG tablet  Commonly known as:  ZANTAC  Take 1 tablet (150 mg total) by mouth 2 (two) times daily.     simvastatin 40 MG tablet  Commonly known as:  ZOCOR  Take 1 tablet (40 mg total) by mouth every evening.     tiZANidine 2 MG tablet  Commonly known as:  ZANAFLEX  Take 2 mg by mouth every 6 (six) hours as needed for muscle spasms.     topiramate 25 MG tablet  Commonly known as:  TOPAMAX  Take 25 mg by mouth. Takes 2 at bedtime.     vitamin B-12 250 MCG tablet  Commonly known as:  CYANOCOBALAMIN  Take 250 mcg by mouth daily.     Allergies  Allergen Reactions  . Augmentin [Amoxicillin-Pot Clavulanate]   . Codeine   . Lipitor [Atorvastatin]     Leg pain  . Wellbutrin [Bupropion]   . Zoloft [Sertraline Hcl]    PMHx:   Past Medical History  Diagnosis Date  . Hypertension   . Hyperlipidemia   . Diabetes mellitus without complication   . Depression   . GERD (gastroesophageal reflux disease)   . Arthritis   . COPD (chronic obstructive pulmonary disease)   . Vitamin D deficiency   . AVN (avascular necrosis of bone)     Left shoulder  . Allergy    FHx:    Reviewed / unchanged SHx:    Reviewed / unchanged  Systems Review:  Constitutional: Denies fever, chills, wt changes, headaches, insomnia, fatigue, night sweats, change  in appetite. Eyes: Denies redness, blurred vision, diplopia, discharge, itchy, watery eyes.  ENT: Denies discharge, congestion, post nasal drip, epistaxis, sore throat, earache, hearing loss, dental pain, tinnitus, vertigo, sinus pain, snoring.  CV: Denies chest pain, palpitations, irregular heartbeat, syncope, dyspnea, diaphoresis, orthopnea, PND, claudication or edema. Respiratory: denies cough, dyspnea, DOE, pleurisy, hoarseness, laryngitis, wheezing.  Gastrointestinal: Denies dysphagia, odynophagia, heartburn, reflux, water brash, abdominal pain or cramps, nausea, vomiting, bloating, diarrhea, constipation, hematemesis, melena, hematochezia  or hemorrhoids. Genitourinary: Denies dysuria, frequency, urgency, nocturia, hesitancy, discharge, hematuria or  flank pain. Musculoskeletal: Denies arthralgias, myalgias, stiffness, jt. swelling, pain, limping or strain/sprain.  Skin: Denies pruritus, rash, hives, warts, acne, eczema or change in skin lesion(s). Neuro: No weakness, tremor, incoordination, spasms, paresthesia or pain. Psychiatric: Denies confusion, memory loss or sensory loss. Endo: Denies change in weight, skin or hair change.  Heme/Lymph: No excessive bleeding, bruising or enlarged lymph nodes.  Exam:  BP 116/68  Pulse 64  Temp(Src) 97.8 F (36.6 C) (Temporal)  Resp 16  Ht 5\' 4"  (1.626 m)  Wt 145 lb (65.772 kg)  BMI 24.88 kg/m2  Appears well nourished and in no distress. Eyes: PERRLA, EOMs, conjunctiva no swelling or erythema. Sinuses: No frontal/maxillary tenderness ENT/Mouth: EAC's clear, TM's nl w/o erythema, bulging. Nares clear w/o erythema, swelling, exudates. Oropharynx clear without erythema or exudates. Oral hygiene is good. Tongue normal, non obstructing. Hearing intact.  Neck: Supple. Thyroid nl. Car 2+/2+ without bruits, nodes or JVD. Chest: Respirations nl with BS clear & equal w/o rales, rhonchi, wheezing or stridor.  Cor: Heart sounds normal w/ regular rate and rhythm without sig. murmurs, gallops, clicks, or rubs. Peripheral pulses normal and equal  without edema.  Abdomen: Soft & bowel sounds normal. Non-tender w/o guarding, rebound, hernias, masses, or organomegaly.  Lymphatics: Unremarkable.  Musculoskeletal: Full ROM all peripheral extremities, joint stability, 5/5 strength, and normal gait.  Skin: Warm, dry without exposed rashes, lesions or ecchymosis apparent.  Neuro: Cranial nerves intact, reflexes equal bilaterally. Sensory-motor testing grossly intact. Tendon reflexes grossly intact.  Pysch: Alert & oriented x 3.  Insight and judgement nl & appropriate. No ideations.  Assessment and Plan:  1. Hypertension - Continue monitor blood pressure at home. Continue diet/meds same.  2. Hyperlipidemia -  Continue diet/meds, exercise,& lifestyle modifications. Continue monitor periodic cholesterol/liver & renal functions   3. PreDiabetes, screening.   4. Vitamin D Deficiency - Continue supplementation.  Recommended regular exercise, BP monitoring, weight control, and discussed med and SE's. Recommended labs to assess and monitor clinical status. Further disposition pending results of labs.

## 2013-11-02 LAB — HEPATIC FUNCTION PANEL
ALBUMIN: 4.5 g/dL (ref 3.5–5.2)
ALT: 24 U/L (ref 0–35)
AST: 27 U/L (ref 0–37)
Alkaline Phosphatase: 98 U/L (ref 39–117)
Bilirubin, Direct: 0.1 mg/dL (ref 0.0–0.3)
Indirect Bilirubin: 0.4 mg/dL (ref 0.2–1.2)
TOTAL PROTEIN: 6.6 g/dL (ref 6.0–8.3)
Total Bilirubin: 0.5 mg/dL (ref 0.2–1.2)

## 2013-11-02 LAB — BASIC METABOLIC PANEL WITH GFR
BUN: 10 mg/dL (ref 6–23)
CO2: 22 mEq/L (ref 19–32)
Calcium: 9.8 mg/dL (ref 8.4–10.5)
Chloride: 108 mEq/L (ref 96–112)
Creat: 0.82 mg/dL (ref 0.50–1.10)
GFR, Est African American: >89 mL/min
GFR, Est Non African American: 79 mL/min
GLUCOSE: 96 mg/dL (ref 70–99)
POTASSIUM: 3.9 meq/L (ref 3.5–5.3)
Sodium: 139 mEq/L (ref 135–145)

## 2013-11-02 LAB — LIPID PANEL
CHOLESTEROL: 160 mg/dL (ref 0–200)
HDL: 49 mg/dL (ref >39)
LDL CALC: 81 mg/dL (ref 0–99)
TRIGLYCERIDES: 149 mg/dL (ref <150)
Total CHOL/HDL Ratio: 3.3 Ratio
VLDL: 30 mg/dL (ref 0–40)

## 2013-11-02 LAB — VITAMIN D 25 HYDROXY (VIT D DEFICIENCY, FRACTURES): Vit D, 25-Hydroxy: 34 ng/mL (ref 30–89)

## 2013-11-02 LAB — INSULIN, FASTING: INSULIN FASTING, SERUM: 21.2 u[IU]/mL — AB (ref 2.0–19.6)

## 2013-11-02 LAB — TSH: TSH: 1.237 u[IU]/mL (ref 0.350–4.500)

## 2013-11-02 LAB — MAGNESIUM: Magnesium: 2 mg/dL (ref 1.5–2.5)

## 2014-02-11 ENCOUNTER — Encounter: Payer: Self-pay | Admitting: Physician Assistant

## 2014-02-11 ENCOUNTER — Ambulatory Visit (INDEPENDENT_AMBULATORY_CARE_PROVIDER_SITE_OTHER): Payer: Medicare Other | Admitting: Physician Assistant

## 2014-02-11 VITALS — BP 110/70 | HR 72 | Temp 97.7°F | Resp 16 | Ht 64.0 in | Wt 153.0 lb

## 2014-02-11 DIAGNOSIS — F17200 Nicotine dependence, unspecified, uncomplicated: Secondary | ICD-10-CM

## 2014-02-11 DIAGNOSIS — E559 Vitamin D deficiency, unspecified: Secondary | ICD-10-CM

## 2014-02-11 DIAGNOSIS — R7303 Prediabetes: Secondary | ICD-10-CM

## 2014-02-11 DIAGNOSIS — Z79899 Other long term (current) drug therapy: Secondary | ICD-10-CM

## 2014-02-11 DIAGNOSIS — N182 Chronic kidney disease, stage 2 (mild): Secondary | ICD-10-CM

## 2014-02-11 DIAGNOSIS — R7309 Other abnormal glucose: Secondary | ICD-10-CM

## 2014-02-11 DIAGNOSIS — E785 Hyperlipidemia, unspecified: Secondary | ICD-10-CM

## 2014-02-11 DIAGNOSIS — I10 Essential (primary) hypertension: Secondary | ICD-10-CM

## 2014-02-11 LAB — CBC WITH DIFFERENTIAL/PLATELET
Basophils Absolute: 0.1 10*3/uL (ref 0.0–0.1)
Basophils Relative: 1 % (ref 0–1)
Eosinophils Absolute: 0.3 10*3/uL (ref 0.0–0.7)
Eosinophils Relative: 6 % — ABNORMAL HIGH (ref 0–5)
HCT: 36.5 % (ref 36.0–46.0)
Hemoglobin: 12.3 g/dL (ref 12.0–15.0)
LYMPHS PCT: 37 % (ref 12–46)
Lymphs Abs: 2.1 10*3/uL (ref 0.7–4.0)
MCH: 30.9 pg (ref 26.0–34.0)
MCHC: 33.7 g/dL (ref 30.0–36.0)
MCV: 91.7 fL (ref 78.0–100.0)
MONO ABS: 0.4 10*3/uL (ref 0.1–1.0)
MONOS PCT: 8 % (ref 3–12)
MPV: 10.1 fL (ref 9.4–12.4)
Neutro Abs: 2.7 10*3/uL (ref 1.7–7.7)
Neutrophils Relative %: 48 % (ref 43–77)
Platelets: 280 10*3/uL (ref 150–400)
RBC: 3.98 MIL/uL (ref 3.87–5.11)
RDW: 13.6 % (ref 11.5–15.5)
WBC: 5.6 10*3/uL (ref 4.0–10.5)

## 2014-02-11 LAB — HEMOGLOBIN A1C
Hgb A1c MFr Bld: 5.6 % (ref <5.7)
MEAN PLASMA GLUCOSE: 114 mg/dL (ref <117)

## 2014-02-11 NOTE — Progress Notes (Signed)
Assessment and Plan:  Hypertension: Continue medication, monitor blood pressure at home. Continue DASH diet.  Reminder to go to the ER if any CP, SOB, nausea, dizziness, severe HA, changes vision/speech, left arm numbness and tingling, and jaw pain. CKD due to HTN stage II-Increase fluids, avoid NSAIDS, monitor sugars, will monitor Cholesterol: Continue diet and exercise. Check cholesterol.  Pre-diabetes-Continue diet and exercise. Check A1C Vitamin D Def- check level and continue medications.  Otitis effusion- no infection- suggest flonase/nasonex, allergy pills, and hold nose while drinking water to autoinflate, if drainage from ear, fever, chills, HA, nausea, call the office or go to the ER  Continue diet and meds as discussed. Further disposition pending results of labs.  HPI 58 y.o. female  presents for 3 month follow up with hypertension, hyperlipidemia, prediabetes and vitamin D. Her blood pressure has been controlled at home, today their BP is BP: 110/70 mmHg  She has CKD stage II due to HTN.  She does workout. She denies chest pain, shortness of breath, dizziness.  She is on cholesterol medication and denies myalgias. Her cholesterol is at goal. The cholesterol last visit was:   Lab Results  Component Value Date   CHOL 160 11/01/2013   HDL 49 11/01/2013   LDLCALC 81 11/01/2013   TRIG 149 11/01/2013   CHOLHDL 3.3 11/01/2013   She has been working on diet and exercise for prediabetes, and denies paresthesia of the feet, polydipsia, polyuria and visual disturbances. Last A1C in the office was:  Lab Results  Component Value Date   HGBA1C 5.7* 11/01/2013   Patient is on Vitamin D supplement.   Lab Results  Component Value Date   VD25OH 34 11/01/2013   She follows with pain management, she has had some weight gain due to the holidays and she hurt her back moving some things around in her basement getting a new furnace, she states that she is now on opana.  She has had bilateral  ear fullness with some dizziness since sept.     Current Medications:  Current Outpatient Prescriptions on File Prior to Visit  Medication Sig Dispense Refill  . albuterol (PROVENTIL HFA;VENTOLIN HFA) 108 (90 BASE) MCG/ACT inhaler Inhale 2 puffs into the lungs every 6 (six) hours as needed for wheezing or shortness of breath. 1 Inhaler 2  . cholecalciferol (VITAMIN D) 1000 UNITS tablet Take 5,000 Units by mouth daily.    . clobetasol (TEMOVATE) 0.05 % external solution Apply 1 application topically 2 (two) times daily. 50 mL 0  . FLUoxetine (PROZAC) 40 MG capsule take 1 capsule by mouth twice a day 60 capsule 5  . oxyCODONE-acetaminophen (PERCOCET) 10-325 MG per tablet Take 1 tablet by mouth every 6 (six) hours as needed for pain.    . ranitidine (ZANTAC) 150 MG tablet Take 1 tablet (150 mg total) by mouth 2 (two) times daily. 60 tablet 5  . simvastatin (ZOCOR) 40 MG tablet Take 1 tablet (40 mg total) by mouth every evening. 30 tablet 3  . tiZANidine (ZANAFLEX) 2 MG tablet Take 2 mg by mouth every 6 (six) hours as needed for muscle spasms.    Marland Kitchen topiramate (TOPAMAX) 25 MG tablet Take 25 mg by mouth. Takes 2 at bedtime.    . vitamin B-12 (CYANOCOBALAMIN) 250 MCG tablet Take 250 mcg by mouth daily.     No current facility-administered medications on file prior to visit.   Medical History:  Past Medical History  Diagnosis Date  . Hypertension   . Hyperlipidemia   .  Diabetes mellitus without complication   . Depression   . GERD (gastroesophageal reflux disease)   . Arthritis   . COPD (chronic obstructive pulmonary disease)   . Vitamin D deficiency   . AVN (avascular necrosis of bone)     Left shoulder  . Allergy    Allergies:  Allergies  Allergen Reactions  . Augmentin [Amoxicillin-Pot Clavulanate]   . Codeine   . Lipitor [Atorvastatin]     Leg pain  . Wellbutrin [Bupropion]   . Zoloft [Sertraline Hcl]      Review of Systems:  Review of Systems  Constitutional: Negative.    HENT: Positive for congestion and ear pain. Negative for ear discharge, hearing loss, nosebleeds, sore throat and tinnitus.   Eyes: Negative.   Respiratory: Negative.  Negative for stridor.   Cardiovascular: Negative.   Gastrointestinal: Negative.   Genitourinary: Negative.   Musculoskeletal: Positive for myalgias, back pain, joint pain and neck pain. Negative for falls.  Skin: Negative.   Neurological: Positive for dizziness. Negative for tingling, tremors, sensory change, speech change, focal weakness, seizures, loss of consciousness and headaches.  Psychiatric/Behavioral: Negative.      Family history- Review and unchanged Social history- Review and unchanged Physical Exam: BP 110/70 mmHg  Pulse 72  Temp(Src) 97.7 F (36.5 C)  Resp 16  Ht 5\' 4"  (1.626 m)  Wt 153 lb (69.4 kg)  BMI 26.25 kg/m2 Wt Readings from Last 3 Encounters:  02/11/14 153 lb (69.4 kg)  11/01/13 145 lb (65.772 kg)  07/30/13 152 lb (68.947 kg)   General Appearance: Well nourished, in no apparent distress. Eyes: PERRLA, EOMs, conjunctiva no swelling or erythema Sinuses: No Frontal/maxillary tenderness ENT/Mouth: Ext aud canals clear, TMs without erythema, bulging. No erythema, swelling, or exudate on post pharynx.  Tonsils not swollen or erythematous. Hearing normal.  Neck: Supple, thyroid normal.  Respiratory: Respiratory effort normal, BS equal bilaterally without rales, rhonchi, wheezing or stridor.  Cardio: RRR with no MRGs. Brisk peripheral pulses without edema.  Abdomen: Soft, + BS.  Non tender, no guarding, rebound, hernias, masses. Lymphatics: Non tender without lymphadenopathy.  Musculoskeletal: Full ROM, 5/5 strength, normal gait.  Skin: Warm, dry without rashes, lesions, ecchymosis.  Neuro: Cranial nerves intact. Normal muscle tone, no cerebellar symptoms. Sensation intact.  Psych: Awake and oriented X 3, normal affect, Insight and Judgment appropriate.    Vicie Mutters, PA-C 10:49  AM [facility]

## 2014-02-11 NOTE — Patient Instructions (Signed)
Your ears and sinuses are connected by the eustachian tube. When your sinuses are inflamed, this can close off the tube and cause fluid to collect in your middle ear. This can then cause dizziness, popping, clicking, ringing, and echoing in your ears. This is often NOT an infection and does NOT require antibiotics, it is caused by inflammation so the treatments help the inflammation. This can take a long time to get better so please be patient.  Here are things you can do to help with this: - Try the Flonase or Nasonex or nasal sample. Remember to spray each nostril twice towards the outer part of your eye.  Do not sniff but instead pinch your nose and tilt your head back to help the medicine get into your sinuses.  The best time to do this is at bedtime.Stop if you get blurred vision or nose bleeds.  -While drinking fluids, pinch and hold nose close and swallow, to help open eustachian tubes to drain fluid behind ear drums. -Please pick one of the over the counter allergy medications below and take it once daily for allergies.  It will also help with fluid behind ear drums. Claritin or loratadine cheapest but likely the weakest  Zyrtec or certizine at night because it can make you sleepy The strongest is allegra or fexafinadine  Cheapest at [employer], [employer], [employer] -can use decongestant over the counter, please do not use if you have high blood pressure or certain heart conditions.   if worsening HA, changes vision/speech, imbalance, weakness go to the ER     Bad carbs also include fruit juice, alcohol, and sweet tea. These are empty calories that do not signal to your brain that you are full.   Please remember the good carbs are still carbs which convert into sugar. So please measure them out no more than 1/2-1 cup of rice, oatmeal, pasta, and beans  Veggies are however free foods! Pile them on.   Not all fruit is created equal. Please see the list below, the fruit at the bottom is higher in  sugars than the fruit at the top. Please avoid all dried fruits.

## 2014-02-12 LAB — BASIC METABOLIC PANEL WITH GFR
BUN: 11 mg/dL (ref 6–23)
CALCIUM: 8.9 mg/dL (ref 8.4–10.5)
CO2: 24 meq/L (ref 19–32)
CREATININE: 0.9 mg/dL (ref 0.50–1.10)
Chloride: 106 mEq/L (ref 96–112)
GFR, EST NON AFRICAN AMERICAN: 71 mL/min
GFR, Est African American: 82 mL/min
Glucose, Bld: 85 mg/dL (ref 70–99)
Potassium: 4.5 mEq/L (ref 3.5–5.3)
SODIUM: 140 meq/L (ref 135–145)

## 2014-02-12 LAB — INSULIN, FASTING: INSULIN FASTING, SERUM: 8.2 u[IU]/mL (ref 2.0–19.6)

## 2014-02-12 LAB — HEPATIC FUNCTION PANEL
ALBUMIN: 4.1 g/dL (ref 3.5–5.2)
ALT: 18 U/L (ref 0–35)
AST: 17 U/L (ref 0–37)
Alkaline Phosphatase: 82 U/L (ref 39–117)
BILIRUBIN TOTAL: 0.3 mg/dL (ref 0.2–1.2)
Bilirubin, Direct: 0.1 mg/dL (ref 0.0–0.3)
Indirect Bilirubin: 0.2 mg/dL (ref 0.2–1.2)
Total Protein: 6.5 g/dL (ref 6.0–8.3)

## 2014-02-12 LAB — LIPID PANEL
CHOL/HDL RATIO: 3.7 ratio
CHOLESTEROL: 210 mg/dL — AB (ref 0–200)
HDL: 57 mg/dL (ref >39)
LDL Cholesterol: 126 mg/dL — ABNORMAL HIGH (ref 0–99)
TRIGLYCERIDES: 134 mg/dL (ref <150)
VLDL: 27 mg/dL (ref 0–40)

## 2014-02-12 LAB — VITAMIN D 25 HYDROXY (VIT D DEFICIENCY, FRACTURES): Vit D, 25-Hydroxy: 20 ng/mL — ABNORMAL LOW (ref 30–100)

## 2014-02-12 LAB — TSH: TSH: 1.338 u[IU]/mL (ref 0.350–4.500)

## 2014-02-12 LAB — MAGNESIUM: Magnesium: 2 mg/dL (ref 1.5–2.5)

## 2014-03-10 ENCOUNTER — Other Ambulatory Visit: Payer: Self-pay | Admitting: Physician Assistant

## 2014-05-21 ENCOUNTER — Encounter: Payer: Self-pay | Admitting: Internal Medicine

## 2014-05-21 ENCOUNTER — Other Ambulatory Visit: Payer: Self-pay | Admitting: Physician Assistant

## 2014-05-21 ENCOUNTER — Ambulatory Visit (INDEPENDENT_AMBULATORY_CARE_PROVIDER_SITE_OTHER): Payer: Medicare Other | Admitting: Internal Medicine

## 2014-05-21 VITALS — BP 116/72 | HR 60 | Temp 98.3°F | Resp 16 | Ht 64.0 in | Wt 153.0 lb

## 2014-05-21 DIAGNOSIS — Z0001 Encounter for general adult medical examination with abnormal findings: Secondary | ICD-10-CM

## 2014-05-21 DIAGNOSIS — Z9181 History of falling: Secondary | ICD-10-CM

## 2014-05-21 DIAGNOSIS — R7303 Prediabetes: Secondary | ICD-10-CM

## 2014-05-21 DIAGNOSIS — J3489 Other specified disorders of nose and nasal sinuses: Secondary | ICD-10-CM

## 2014-05-21 DIAGNOSIS — Z1212 Encounter for screening for malignant neoplasm of rectum: Secondary | ICD-10-CM

## 2014-05-21 DIAGNOSIS — M545 Low back pain: Secondary | ICD-10-CM

## 2014-05-21 DIAGNOSIS — F32A Depression, unspecified: Secondary | ICD-10-CM

## 2014-05-21 DIAGNOSIS — Z0000 Encounter for general adult medical examination without abnormal findings: Secondary | ICD-10-CM

## 2014-05-21 DIAGNOSIS — N182 Chronic kidney disease, stage 2 (mild): Secondary | ICD-10-CM

## 2014-05-21 DIAGNOSIS — E785 Hyperlipidemia, unspecified: Secondary | ICD-10-CM

## 2014-05-21 DIAGNOSIS — E559 Vitamin D deficiency, unspecified: Secondary | ICD-10-CM

## 2014-05-21 DIAGNOSIS — I10 Essential (primary) hypertension: Secondary | ICD-10-CM

## 2014-05-21 DIAGNOSIS — Z1331 Encounter for screening for depression: Secondary | ICD-10-CM

## 2014-05-21 DIAGNOSIS — N6019 Diffuse cystic mastopathy of unspecified breast: Secondary | ICD-10-CM

## 2014-05-21 DIAGNOSIS — R6889 Other general symptoms and signs: Secondary | ICD-10-CM

## 2014-05-21 DIAGNOSIS — G894 Chronic pain syndrome: Secondary | ICD-10-CM | POA: Insufficient documentation

## 2014-05-21 DIAGNOSIS — F17219 Nicotine dependence, cigarettes, with unspecified nicotine-induced disorders: Secondary | ICD-10-CM

## 2014-05-21 DIAGNOSIS — J449 Chronic obstructive pulmonary disease, unspecified: Secondary | ICD-10-CM

## 2014-05-21 DIAGNOSIS — M5136 Other intervertebral disc degeneration, lumbar region: Secondary | ICD-10-CM

## 2014-05-21 DIAGNOSIS — F329 Major depressive disorder, single episode, unspecified: Secondary | ICD-10-CM

## 2014-05-21 DIAGNOSIS — Z79899 Other long term (current) drug therapy: Secondary | ICD-10-CM

## 2014-05-21 LAB — BASIC METABOLIC PANEL WITH GFR
BUN: 16 mg/dL (ref 6–23)
CALCIUM: 8.8 mg/dL (ref 8.4–10.5)
CHLORIDE: 105 meq/L (ref 96–112)
CO2: 22 meq/L (ref 19–32)
CREATININE: 0.88 mg/dL (ref 0.50–1.10)
GFR, Est African American: 83 mL/min
GFR, Est Non African American: 72 mL/min
Glucose, Bld: 75 mg/dL (ref 70–99)
Potassium: 4.5 mEq/L (ref 3.5–5.3)
SODIUM: 134 meq/L — AB (ref 135–145)

## 2014-05-21 LAB — CBC WITH DIFFERENTIAL/PLATELET
BASOS PCT: 1 % (ref 0–1)
Basophils Absolute: 0.1 10*3/uL (ref 0.0–0.1)
EOS ABS: 0.1 10*3/uL (ref 0.0–0.7)
EOS PCT: 2 % (ref 0–5)
HEMATOCRIT: 37.8 % (ref 36.0–46.0)
Hemoglobin: 12.5 g/dL (ref 12.0–15.0)
LYMPHS ABS: 2.5 10*3/uL (ref 0.7–4.0)
Lymphocytes Relative: 41 % (ref 12–46)
MCH: 31 pg (ref 26.0–34.0)
MCHC: 33.1 g/dL (ref 30.0–36.0)
MCV: 93.8 fL (ref 78.0–100.0)
MONO ABS: 0.3 10*3/uL (ref 0.1–1.0)
MONOS PCT: 5 % (ref 3–12)
MPV: 10 fL (ref 8.6–12.4)
Neutro Abs: 3.1 10*3/uL (ref 1.7–7.7)
Neutrophils Relative %: 51 % (ref 43–77)
Platelets: 296 10*3/uL (ref 150–400)
RBC: 4.03 MIL/uL (ref 3.87–5.11)
RDW: 13.3 % (ref 11.5–15.5)
WBC: 6 10*3/uL (ref 4.0–10.5)

## 2014-05-21 LAB — TSH: TSH: 1.569 u[IU]/mL (ref 0.350–4.500)

## 2014-05-21 LAB — HEPATIC FUNCTION PANEL
ALBUMIN: 4.5 g/dL (ref 3.5–5.2)
ALT: 18 U/L (ref 0–35)
AST: 17 U/L (ref 0–37)
Alkaline Phosphatase: 85 U/L (ref 39–117)
Bilirubin, Direct: 0.1 mg/dL (ref 0.0–0.3)
Indirect Bilirubin: 0.3 mg/dL (ref 0.2–1.2)
TOTAL PROTEIN: 6.5 g/dL (ref 6.0–8.3)
Total Bilirubin: 0.4 mg/dL (ref 0.2–1.2)

## 2014-05-21 LAB — LIPID PANEL
CHOL/HDL RATIO: 2.9 ratio
Cholesterol: 181 mg/dL (ref 0–200)
HDL: 62 mg/dL (ref >=46)
LDL Cholesterol: 83 mg/dL (ref 0–99)
Triglycerides: 181 mg/dL — ABNORMAL HIGH (ref <150)
VLDL: 36 mg/dL (ref 0–40)

## 2014-05-21 LAB — HEMOGLOBIN A1C
HEMOGLOBIN A1C: 5.8 % — AB (ref <5.7)
MEAN PLASMA GLUCOSE: 120 mg/dL — AB (ref <117)

## 2014-05-21 LAB — MAGNESIUM: Magnesium: 2 mg/dL (ref 1.5–2.5)

## 2014-05-21 MED ORDER — GABAPENTIN 100 MG PO CAPS: ORAL_CAPSULE | ORAL | Status: DC

## 2014-05-21 NOTE — Patient Instructions (Addendum)
Recommend the book "The END of DIETING" by Dr Excell Seltzer   & the book "The END of DIABETES " by Dr Excell Seltzer  At [email or web address] - get book & Audio CD's      Being diabetic has a  300% increased risk for heart attack, stroke, cancer, and alzheimer- type vascular dementia. It is very important that you work harder with diet by avoiding all foods that are white. Avoid white rice (brown & wild rice is OK), white potatoes (sweetpotatoes in moderation is OK), White bread or wheat bread or anything made out of white flour like bagels, donuts, rolls, buns, biscuits, cakes, pastries, cookies, pizza crust, and pasta (made from white flour & egg whites) - vegetarian pasta or spinach or wheat pasta is OK. Multigrain breads like Arnold's or [employer], or multigrain sandwich thins or flatbreads.  Diet, exercise and weight loss can reverse and cure diabetes in the early stages.  Diet, exercise and weight loss is very important in the control and prevention of complications of diabetes which affects every system in your body, ie. Brain - dementia/stroke, eyes - glaucoma/blindness, heart - heart attack/heart failure, kidneys - dialysis, stomach - gastric paralysis, intestines - malabsorption, nerves - severe painful neuritis, circulation - gangrene & loss of a leg(s), and finally cancer and Alzheimers.    I recommend avoid fried & greasy foods,  sweets/candy, white rice (brown or wild rice or Quinoa is OK), white potatoes (sweet potatoes are OK) - anything made from white flour - bagels, doughnuts, rolls, buns, biscuits,white and wheat breads, pizza crust and traditional pasta made of white flour & egg white(vegetarian pasta or spinach or wheat pasta is OK).  Multi-grain bread is OK - like multi-grain flat bread or sandwich thins. Avoid alcohol in excess. Exercise is also important.    Eat all the vegetables you want - avoid meat, especially red meat and dairy - especially cheese.  Cheese is the most  concentrated form of trans-fats which is the worst thing to clog up our arteries. Veggie cheese is OK which can be found in the fresh produce section at [employer] or [employer] or [employer]  Preventive Care for Adults A healthy lifestyle and preventive care can promote health and wellness. Preventive health guidelines for women include the following key practices.  A routine yearly physical is a good way to check with your health care provider about your health and preventive screening. It is a chance to share any concerns and updates on your health and to receive a thorough exam.  Visit your dentist for a routine exam and preventive care every 6 months. Brush your teeth twice a day and floss once a day. Good oral hygiene prevents tooth decay and gum disease.  The frequency of eye exams is based on your age, health, family medical history, use of contact lenses, and other factors. Follow your health care provider's recommendations for frequency of eye exams.  Eat a healthy diet. Foods like vegetables, fruits, whole grains, low-fat dairy products, and lean protein foods contain the nutrients you need without too many calories. Decrease your intake of foods high in solid fats, added sugars, and salt. Eat the right amount of calories for you.Get information about a proper diet from your health care provider, if necessary.  Regular physical exercise is one of the most important things you can do for your health. Most adults should get at least 150 minutes of moderate-intensity exercise (any activity that increases your heart rate and  causes you to sweat) each week. In addition, most adults need muscle-strengthening exercises on 2 or more days a week.  Maintain a healthy weight. The body mass index (BMI) is a screening tool to identify possible weight problems. It provides an estimate of body fat based on height and weight. Your health care provider can find your BMI and can help you achieve or  maintain a healthy weight.For adults 20 years and older:  A BMI below 18.5 is considered underweight.  A BMI of 18.5 to 24.9 is normal.  A BMI of 25 to 29.9 is considered overweight.  A BMI of 30 and above is considered obese.  Maintain normal blood lipids and cholesterol levels by exercising and minimizing your intake of saturated fat. Eat a balanced diet with plenty of fruit and vegetables. Blood tests for lipids and cholesterol should begin at age 57 and be repeated every 5 years. If your lipid or cholesterol levels are high, you are over 50, or you are at high risk for heart disease, you may need your cholesterol levels checked more frequently.Ongoing high lipid and cholesterol levels should be treated with medicines if diet and exercise are not working.  If you smoke, find out from your health care provider how to quit. If you do not use tobacco, do not start.  Lung cancer screening is recommended for adults aged 52-80 years who are at high risk for developing lung cancer because of a history of smoking. A yearly low-dose CT scan of the lungs is recommended for people who have at least a 30-pack-year history of smoking and are a current smoker or have quit within the past 15 years. A pack year of smoking is smoking an average of 1 pack of cigarettes a day for 1 year (for example: 1 pack a day for 30 years or 2 packs a day for 15 years). Yearly screening should continue until the smoker has stopped smoking for at least 15 years. Yearly screening should be stopped for people who develop a health problem that would prevent them from having lung cancer treatment.  High blood pressure causes heart disease and increases the risk of stroke. Your blood pressure should be checked at least every 1 to 2 years. Ongoing high blood pressure should be treated with medicines if weight loss and exercise do not work.  If you are 43-25 years old, ask your health care provider if you should take aspirin to  prevent strokes.  Diabetes screening involves taking a blood sample to check your fasting blood sugar level. This should be done once every 3 years, after age 36, if you are within normal weight and without risk factors for diabetes. Testing should be considered at a younger age or be carried out more frequently if you are overweight and have at least 1 risk factor for diabetes.  Breast cancer screening is essential preventive care for women. You should practice "breast self-awareness." This means understanding the normal appearance and feel of your breasts and may include breast self-examination. Any changes detected, no matter how small, should be reported to a health care provider. Women in their 73s and 30s should have a clinical breast exam (CBE) by a health care provider as part of a regular health exam every 1 to 3 years. After age 12, women should have a CBE every year. Starting at age 75, women should consider having a mammogram (breast X-ray test) every year. Women who have a family history of breast cancer should talk to  their health care provider about genetic screening. Women at a high risk of breast cancer should talk to their health care providers about having an MRI and a mammogram every year.  Breast cancer gene (BRCA)-related cancer risk assessment is recommended for women who have family members with BRCA-related cancers. BRCA-related cancers include breast, ovarian, tubal, and peritoneal cancers. Having family members with these cancers may be associated with an increased risk for harmful changes (mutations) in the breast cancer genes BRCA1 and BRCA2. Results of the assessment will determine the need for genetic counseling and BRCA1 and BRCA2 testing.  Routine pelvic exams to screen for cancer are no longer recommended for nonpregnant women who are considered low risk for cancer of the pelvic organs (ovaries, uterus, and vagina) and who do not have symptoms. Ask your health care provider  if a screening pelvic exam is right for you.  If you have had past treatment for cervical cancer or a condition that could lead to cancer, you need Pap tests and screening for cancer for at least 20 years after your treatment. If Pap tests have been discontinued, your risk factors (such as having a new sexual partner) need to be reassessed to determine if screening should be resumed. Some women have medical problems that increase the chance of getting cervical cancer. In these cases, your health care provider may recommend more frequent screening and Pap tests.  Colorectal cancer can be detected and often prevented. Most routine colorectal cancer screening begins at the age of 63 years and continues through age 41 years. However, your health care provider may recommend screening at an earlier age if you have risk factors for colon cancer. On a yearly basis, your health care provider may provide home test kits to check for hidden blood in the stool. Use of a small camera at the end of a tube, to directly examine the colon (sigmoidoscopy or colonoscopy), can detect the earliest forms of colorectal cancer. Talk to your health care provider about this at age 57, when routine screening begins. Direct exam of the colon should be repeated every 5-10 years through age 65 years, unless early forms of pre-cancerous polyps or small growths are found.  Hepatitis C blood testing is recommended for all people born from [birth year] through 1965 and any individual with known risks for hepatitis C.  Pra  Osteoporosis is a disease in which the bones lose minerals and strength with aging. This can result in serious bone fractures or breaks. The risk of osteoporosis can be identified using a bone density scan. Women ages 78 years and over and women at risk for fractures or osteoporosis should discuss screening with their health care providers. Ask your health care provider whether you should take a calcium supplement or vitamin D  to reduce the rate of osteoporosis.  Menopause can be associated with physical symptoms and risks. Hormone replacement therapy is available to decrease symptoms and risks. You should talk to your health care provider about whether hormone replacement therapy is right for you.  Use sunscreen. Apply sunscreen liberally and repeatedly throughout the day. You should seek shade when your shadow is shorter than you. Protect yourself by wearing long sleeves, pants, a wide-brimmed hat, and sunglasses year round, whenever you are outdoors.  Once a month, do a whole body skin exam, using a mirror to look at the skin on your back. Tell your health care provider of new moles, moles that have irregular borders, moles that are larger than a pencil  eraser, or moles that have changed in shape or color.  Stay current with required vaccines (immunizations).  Influenza vaccine. All adults should be immunized every year.  Tetanus, diphtheria, and acellular pertussis (Td, Tdap) vaccine. Pregnant women should receive 1 dose of Tdap vaccine during each pregnancy. The dose should be obtained regardless of the length of time since the last dose. Immunization is preferred during the 27th-36th week of gestation. An adult who has not previously received Tdap or who does not know her vaccine status should receive 1 dose of Tdap. This initial dose should be followed by tetanus and diphtheria toxoids (Td) booster doses every 10 years. Adults with an unknown or incomplete history of completing a 3-dose immunization series with Td-containing vaccines should begin or complete a primary immunization series including a Tdap dose. Adults should receive a Td booster every 10 years.  Varicella vaccine. An adult without evidence of immunity to varicella should receive 2 doses or a second dose if she has previously received 1 dose. Pregnant females who do not have evidence of immunity should receive the first dose after pregnancy. This first  dose should be obtained before leaving the health care facility. The second dose should be obtained 4-8 weeks after the first dose.  Human papillomavirus (HPV) vaccine. Females aged 13-26 years who have not received the vaccine previously should obtain the 3-dose series. The vaccine is not recommended for use in pregnant females. However, pregnancy testing is not needed before receiving a dose. If a female is found to be pregnant after receiving a dose, no treatment is needed. In that case, the remaining doses should be delayed until after the pregnancy. Immunization is recommended for any person with an immunocompromised condition through the age of 24 years if she did not get any or all doses earlier. During the 3-dose series, the second dose should be obtained 4-8 weeks after the first dose. The third dose should be obtained 24 weeks after the first dose and 16 weeks after the second dose.  Zoster vaccine. One dose is recommended for adults aged 52 years or older unless certain conditions are present.  Measles, mumps, and rubella (MMR) vaccine. Adults born before [birth year] generally are considered immune to measles and mumps. Adults born in [birth year] or later should have 1 or more doses of MMR vaccine unless there is a contraindication to the vaccine or there is laboratory evidence of immunity to each of the three diseases. A routine second dose of MMR vaccine should be obtained at least 28 days after the first dose for students attending postsecondary schools, health care workers, or international travelers. People who received inactivated measles vaccine or an unknown type of measles vaccine during 1963-1967 should receive 2 doses of MMR vaccine. People who received inactivated mumps vaccine or an unknown type of mumps vaccine before 1979 and are at high risk for mumps infection should consider immunization with 2 doses of MMR vaccine. For females of childbearing age, rubella immunity should be determined. If there  is no evidence of immunity, females who are not pregnant should be vaccinated. If there is no evidence of immunity, females who are pregnant should delay immunization until after pregnancy. Unvaccinated health care workers born before [birth year] who lack laboratory evidence of measles, mumps, or rubella immunity or laboratory confirmation of disease should consider measles and mumps immunization with 2 doses of MMR vaccine or rubella immunization with 1 dose of MMR vaccine.  Pneumococcal 13-valent conjugate (PCV13) vaccine. When indicated, a person who  is uncertain of her immunization history and has no record of immunization should receive the PCV13 vaccine. An adult aged 68 years or older who has certain medical conditions and has not been previously immunized should receive 1 dose of PCV13 vaccine. This PCV13 should be followed with a dose of pneumococcal polysaccharide (PPSV23) vaccine. The PPSV23 vaccine dose should be obtained at least 8 weeks after the dose of PCV13 vaccine. An adult aged 60 years or older who has certain medical conditions and previously received 1 or more doses of PPSV23 vaccine should receive 1 dose of PCV13. The PCV13 vaccine dose should be obtained 1 or more years after the last PPSV23 vaccine dose.    Pneumococcal polysaccharide (PPSV23) vaccine. When PCV13 is also indicated, PCV13 should be obtained first. All adults aged 42 years and older should be immunized. An adult younger than age 80 years who has certain medical conditions should be immunized. Any person who resides in a nursing home or long-term care facility should be immunized. An adult smoker should be immunized. People with an immunocompromised condition and certain other conditions should receive both PCV13 and PPSV23 vaccines. People with human immunodeficiency virus (HIV) infection should be immunized as soon as possible after diagnosis. Immunization during chemotherapy or radiation therapy should be avoided. Routine use  of PPSV23 vaccine is not recommended for American Indians, [place] Natives, or people younger than 65 years unless there are medical conditions that require PPSV23 vaccine. When indicated, people who have unknown immunization and have no record of immunization should receive PPSV23 vaccine. One-time revaccination 5 years after the first dose of PPSV23 is recommended for people aged 19-64 years who have chronic kidney failure, nephrotic syndrome, asplenia, or immunocompromised conditions. People who received 1-2 doses of PPSV23 before age 70 years should receive another dose of PPSV23 vaccine at age 68 years or later if at least 5 years have passed since the previous dose. Doses of PPSV23 are not needed for people immunized with PPSV23 at or after age 38 years.  Preventive Services / Frequency   Ages 9 to 79 years  Blood pressure check.  Lipid and cholesterol check.  Lung cancer screening. / Every year if you are aged 51-80 years and have a 30-pack-year history of smoking and currently smoke or have quit within the past 15 years. Yearly screening is stopped once you have quit smoking for at least 15 years or develop a health problem that would prevent you from having lung cancer treatment.  Clinical breast exam.** / Every year after age 32 years.  BRCA-related cancer risk assessment.** / For women who have family members with a BRCA-related cancer (breast, ovarian, tubal, or peritoneal cancers).  Mammogram.** / Every year beginning at age 21 years and continuing for as long as you are in good health. Consult with your health care provider.  Pap test.** / Every 3 years starting at age 56 years through age 72 or 3 years with a history of 3 consecutive normal Pap tests.  HPV screening.** / Every 3 years from ages 39 years through ages 22 to 61 years with a history of 3 consecutive normal Pap tests.  Fecal occult blood test (FOBT) of stool. / Every year beginning at age 18 years and continuing  until age 70 years. You may not need to do this test if you get a colonoscopy every 10 years.  Flexible sigmoidoscopy or colonoscopy.** / Every 5 years for a flexible sigmoidoscopy or every 10 years for a colonoscopy beginning  at age 78 years and continuing until age 65 years.  Hepatitis C blood test.** / For all people born from [birth year] through 1966 and any individual with known risks for hepatitis C.  Skin self-exam. / Monthly.  Influenza vaccine. / Every year.  Tetanus, diphtheria, and acellular pertussis (Tdap/Td) vaccine.** / Consult your health care provider. Pregnant women should receive 1 dose of Tdap vaccine during each pregnancy. 1 dose of Td every 10 years.  Varicella vaccine.** / Consult your health care provider. Pregnant females who do not have evidence of immunity should receive the first dose after pregnancy.  Zoster vaccine.** / 1 dose for adults aged 64 years or older.  Pneumococcal 13-valent conjugate (PCV13) vaccine.** / Consult your health care provider.  Pneumococcal polysaccharide (PPSV23) vaccine.** / 1 to 2 doses if you smoke cigarettes or if you have certain conditions.  Meningococcal vaccine.** / Consult your health care provider.  Hepatitis A vaccine.** / Consult your health care provider.  Hepatitis B vaccine.** / Consult your health care provider. Screening for abdominal aortic aneurysm (AAA)  by ultrasound is recommended for people over 50 who have history of high blood pressure or who are current or former smokers.

## 2014-05-21 NOTE — Progress Notes (Signed)
Patient ID: NYNA CHILTON, female   DOB: [birth year], 59 y.o.   MRN: [id number]  [facility] VISIT AND CPE  Assessment:   1. Essential hypertension  - Microalbumin / creatinine urine ratio - EKG 12-Lead - TSH - DG Chest 2 View; Future  2. Hyperlipidemia  - Lipid panel  3. Prediabetes  - Hemoglobin A1c - Insulin, random  4. Vitamin D deficiency  - Vit D  25 hydroxy (rtn osteoporosis monitoring)  5. CKD (chronic kidney disease) stage 2, GFR 60-89 ml/min   6. Chronic obstructive pulmonary disease, unspecified COPD, unspecified chronic bronchitis type  - DG Chest 2 View; Future  7. Screening for rectal cancer  - POC Hemoccult Bld/Stl (3-Cd Home Screen); Future  8. Depression screen   9. Depression, controlled   10. At low risk for fall   11. Low back pain, unspecified back pain laterality, with sciatica presence unspecified  - gabapentin (NEURONTIN) 100 MG capsule; Take 1 capsule 3 x day for chronic pain  Dispense: 90 capsule; Refill: 2 - To confirm approval before beginning by her pain management Doctors.  12. Lesion of nose  - Ambulatory referral to ENT  13. Cystic mastopathy, unspecified laterality  - MM Digital Screening; Future  14. DDD (degenerative disc disease), lumbar   15. Chronic pain syndrome   16. Medication management  - Urine Microscopic - CBC with Differential/Platelet - BASIC METABOLIC PANEL WITH GFR - Hepatic function panel - Magnesium  17. Routine general medical examination at a health care facility   Plan:   During the course of the visit the patient was educated and counseled about appropriate screening and preventive services including:    Pneumococcal vaccine   Influenza vaccine  Td vaccine  Screening electrocardiogram  Bone densitometry screening  Colorectal cancer screening  Diabetes screening  Glaucoma screening  Nutrition counseling   Advanced directives: requested  Screening  recommendations, referrals: Vaccinations:  Immunization History  Administered Date(s) Administered  . Td 01/19/2004  Tdap vaccine declined Influenza vaccine declined Pneumococcal vaccine declined Prevnar vaccine declined Shingles vaccine not indicated Hep B vaccine not indicated  Nutrition assessed and recommended  Colonoscopy 01/18/2006 Recommended yearly ophthalmology/optometry visit for glaucoma screening and checkup Recommended yearly dental visit for hygiene and checkup Advanced directives - yes  Conditions/risks identified: BMI: Discussed weight loss, diet, and increase physical activity.  Increase physical activity: AHA recommends 150 minutes of physical activity a week.  Medications reviewed PreDiabetes is at goal, ACE/ARB therapy: not Indicated Urinary Incontinence is not an issue: discussed non pharmacology and pharmacology options.  Fall risk: low- discussed PT, home fall assessment, medications.   Subjective:    TELISA OHLSEN presents for [employer] Visit and complete physical.  No prior medicare wellness visit is known.  This very nice 59 y.o. SWF presents for follow up with Hypertension, Hyperlipidemia, COPD, Pre-Diabetes and Vitamin D Deficiency. Patient has 55+ pk yr smoking of some 37 years x 1.5 PPD and does have a chronic productive cough.    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 Is on maintenance with Opoids by Dr Greta Doom (?) at [facility].   Patient is was treated for HTN since 1998, but has been off of BP meds since 2000 with expectant monitoring. It's felt like that her pain meds have  at least contributed to her lower BP. Today's BP: 116/72 mmHg. Patient has had no complaints of any cardiac type chest pain, palpitations, dyspnea/orthopnea/PND, dizziness, claudication, or  dependent edema.   Hyperlipidemia is not controlled with diet & meds. Patient denies myalgias or other med SE's. Last Lipids were near, but not at goal -  Chol  210; HDL 57; LDL 126; Trig 134 on 02/11/2014   Also, the patient has history of PreDiabetes and has had no symptoms of reactive hypoglycemia, diabetic polys, paresthesias or visual blurring.  Last A1c was  5.6% on 02/11/2014.   Further, the patient also has history of Vitamin D Deficiency of 20 in the past she sporadicallysupplements vitamin D without any suspected side-effects. Last vitamin D was still low at 20 on 02/11/2014.  Names of Other Physician/Practitioners you currently use: 1. [facility] here for primary care 2. Dr Katy Fitch, eye doctor, last visit 2010 - encouraged to schedule eye exam 3. Dr Henrene Pastor, [place], dentist, last visit > 2 yrs - encouraged to schedule F/U OV  Patient Care Team: Unk Pinto, MD as PCP - General (Internal Medicine) Richmond Campbell, MD as Consulting Physician (Gastroenterology)  Medication Review: Medication Sig  . albuterol  HFA inhaler Inhale 2 puffs into the lungs every 6 (six) hours as needed for wheezing or shortness of breath.  Marland Kitchen VITAMIN D 1000 U Take 5,000 Units by mouth daily.  . clobetasol (TEMOVATE) 0.05 % ext soln apply 1 APPLICATION to affected area twice a day  . FLUoxetine 40 MG capsule take 1 capsule by mouth twice a day  . PERCOCET 10-325 MG  Take 1 tablet by mouth every 6 (six) hours as needed for pain.  . ranitidine  150 MG tablet Take 1 tablet (150 mg total) by mouth 2 (two) times daily.  Marland Kitchen tiZANidine  2 MG tablet Take 2 mg by mouth every 6 (six) hours as needed for muscle spasms.  Marland Kitchen topiramate  25 MG tablet Take 25 mg by mouth. Takes 2 at bedtime.  . vitamin B-12  250 MCG t Take 250 mcg by mouth daily.  . Oxymorphone ER (OPANA ER) 15 MG  Take 15 mg by mouth every 12 (twelve) hours.   Current Problems (verified) Patient Active Problem List    Diagnosis Date Noted  . CKD (chronic kidney disease) stage 2, GFR 60-89 ml/min 02/11/2014  . Tobacco use disorder 02/11/2014  . Medication management 11/01/2013  . COPD (chronic obstructive pulmonary disease) 04/27/2013  . Hypertension   . Hyperlipidemia   . Prediabetes   . Vitamin D deficiency    Screening Tests Health Maintenance  Topic Date Due  . PNEUMOCOCCAL POLYSACCHARIDE VACCINE (1) 02/12/1957  . FOOT EXAM  02/12/1965  . OPHTHALMOLOGY EXAM  02/12/1965  . URINE MICROALBUMIN  02/12/1965  . HIV Screening  02/12/1970  . MAMMOGRAM  05/17/2012  . PAP SMEAR  01/18/2013  . TETANUS/TDAP  01/18/2014  . HEMOGLOBIN A1C  08/13/2014  . INFLUENZA VACCINE  09/16/2014  . COLONOSCOPY  01/19/2016   Immunization History  Administered Date(s) Administered  . Td 01/19/2004   Preventative care: Last colonoscopy: 01/18/2006  History reviewed: allergies, current medications, past family history, past medical history, past social history, past surgical history and problem list  Risk Factors: Tobacco History  Substance Use Topics  . Smoking status: Current Every Day Smoker -- 1.00 packs/day for 30 years  . Smokeless tobacco: Never Used  . Alcohol Use: Yes     Comment: quit  She does smoke. X 37 yrs x 1&1/2 ppd = 55 pk yrs.  Are there smokers in your home (other than you)?  No Alcohol Current alcohol use: none  Caffeine Current caffeine use: denies use  Exercise Current exercise: none - limited by Chronic pain.   Nutrition/Diet Current diet: in general, an "unhealthy" diet  Cardiac risk factors: advanced age (older than 9 for men, 33 for women), dyslipidemia, hypertension, sedentary lifestyle and smoking/ tobacco exposure.  Depression Screen (Note: if answer to either of the following is "Yes", a more complete depression screening is indicated)   Q1: Over the past two weeks, have you felt down, depressed or hopeless? No  Q2: Over the past two weeks, have you felt little  interest or pleasure in doing things? No  Have you lost interest or pleasure in daily life? No  Do you often feel hopeless? No  Do you cry easily over simple problems? No  Activities of Daily Living In your present state of health, do you have any difficulty performing the following activities?:  Driving? No Managing money?  No Feeding yourself? No Getting from bed to chair? No Climbing a flight of stairs? No Preparing food and eating?: No Bathing or showering? No Getting dressed: No Getting to the toilet? No Using the toilet:No Moving around from place to place: No In the past year have you fallen or had a near fall?:No   Are you sexually active?  No  Do you have more than one partner?  No  Vision Difficulties: No  Hearing Difficulties: No Do you often ask people to speak up or repeat themselves? No Do you experience ringing or noises in your ears? No Do you have difficulty understanding soft or whispered voices? Sometimes.  Cognition  Do you feel that you have a problem with memory?No  Do you often misplace items? No  Do you feel safe at home?  Yes  Advanced directives Does patient have a Thorp? Yes Does patient have a Living Will? Yes  Past Medical History  Diagnosis Date  . Hypertension   . Hyperlipidemia   . Diabetes mellitus without complication   . Depression   . GERD (gastroesophageal reflux disease)   . Arthritis   . COPD (chronic obstructive pulmonary disease)   . Vitamin D deficiency   . AVN (avascular necrosis of bone)     Left shoulder  . Allergy    Past Surgical History  Procedure Laterality Date  . Cataract extraction Bilateral   . Spine surgery      lumbar  . Ankle surgery Right   . Tonsillectomy and adenoidectomy     ROS: Constitutional: Denies fever, chills, weight loss/gain, headaches, insomnia, fatigue, night sweats, and change in appetite. Eyes: Denies redness, blurred vision, diplopia, discharge, itchy,  watery eyes.  ENT: Denies discharge, congestion, post nasal drip, epistaxis, sore throat, earache, hearing loss, dental pain, Tinnitus, Vertigo, Sinus pain, snoring.  Cardio: Denies chest pain, palpitations, irregular heartbeat, syncope, dyspnea, diaphoresis, orthopnea, PND, claudication, edema Respiratory: denies cough, dyspnea, DOE, pleurisy, hoarseness, laryngitis, wheezing.  Gastrointestinal: Denies dysphagia, heartburn, reflux, water brash, pain, cramps, nausea, vomiting, bloating, diarrhea, constipation, hematemesis, melena, hematochezia, jaundice, hemorrhoids Genitourinary: Denies dysuria, frequency, urgency, nocturia, hesitancy, discharge, hematuria, flank pain Breast: Breast lumps, nipple discharge, bleeding.  Musculoskeletal: Denies arthralgia, myalgia, stiffness, Jt. Swelling, pain, limp, and strain/sprain. Denies falls. Skin: Denies puritis, rash, hives, warts, acne, eczema, changing in skin lesion Neuro: No weakness, tremor, incoordination, spasms, paresthesia, pain  Psychiatric: Denies confusion, memory loss, sensory loss. Denies Depression. Endocrine: Denies change in weight, skin, hair change, nocturia, and paresthesia, diabetic polys, visual blurring, hyper / hypo glycemic episodes.  Heme/Lymph: No excessive bleeding, bruising, enlarged lymph nodes  Objective:     BP 116/72   Pulse 60  Temp 98.3 F   Resp 16  Ht 5\' 4"    Wt 153 lb  69.4 kg)    BMI 26.25   General Appearance: Well nourished, alert, WD/WN, female and in no apparent distress. Eyes: PERRLA, EOMs, conjunctiva no swelling or erythema, normal fundi and vessels. Sinuses: No frontal/maxillary tenderness ENT/Mouth: EACs patent / TMs  nl. There is a cyst or Nodule in the lateral wall of the left Nares. No external deformity is apparent. Oral hygiene is good. No erythema, swelling, or exudate. Tongue normal, non-obstructing. Tonsils not swollen or erythematous. Hearing normal.  Neck: Supple, thyroid normal. No bruits,  nodes or JVD. Respiratory: Respiratory effort normal.  BS equal and clear bilateral without rales, rhonci, wheezing or stridor. Cardio: Heart sounds are normal with regular rate and rhythm and no murmurs, rubs or gallops. Peripheral pulses are normal and equal bilaterally without edema. No aortic or femoral bruits. Chest: symmetric with normal excursions and percussion. Breasts: Symmetric, without lumps, nipple discharge, retractions, or fibrocystic changes.  Abdomen: Flat, soft  with nl bowel sounds. Nontender, no guarding, rebound, hernias, masses, or organomegaly.  Lymphatics: Non tender without lymphadenopathy.  Genitourinary:  Musculoskeletal: Full ROM all peripheral extremities, joint stability, 5/5 strength, and normal gait. Skin: Warm and dry without rashes, lesions, cyanosis, clubbing or  ecchymosis.  Neuro: Cranial nerves intact, reflexes equal bilaterally. Normal muscle tone, no cerebellar symptoms. Sensation intact.  Pysch: Alert and oriented X 3, normal affect, Insight and Judgment appropriate.   Cognitive Testing  Alert? Yes  Normal Appearance?Yes  Oriented to person? Yes  Place? Yes   Time? Yes  Recall of three objects?  Yes  Can perform simple calculations? Yes  Displays appropriate judgment? Yes  Can read the correct time from a watch/clock?Yes  Medicare Attestation I have personally reviewed: The patient's medical and social history Their use of alcohol, tobacco or illicit drugs. Long discussion regarding the health risks consequent of smoking and offered techniques for cessation as well as offered meds as Chantix and Bupropion- which patient declined.   Discussed her current medications and supplements The patient's functional ability including ADLs,fall risks, home safety risks, cognitive, and hearing and visual impairment Diet and physical activities Evidence for depression or mood disorders  The patient's weight, height, BMI, and visual acuity have been recorded  in the chart.  I have made referrals, counseling, and provided education to the patient based on review of the above and I have provided the patient with a written personalized care plan for preventive services.  Over 40 minutes of exam, counseling, chart review was performed.   Ayse Mccartin DAVID, MD   05/21/2014

## 2014-05-22 ENCOUNTER — Other Ambulatory Visit: Payer: Self-pay | Admitting: Internal Medicine

## 2014-05-22 DIAGNOSIS — Z1231 Encounter for screening mammogram for malignant neoplasm of breast: Secondary | ICD-10-CM

## 2014-05-22 LAB — URINALYSIS, MICROSCOPIC ONLY
Bacteria, UA: NONE SEEN
CRYSTALS: NONE SEEN
Casts: NONE SEEN
Squamous Epithelial / LPF: NONE SEEN

## 2014-05-22 LAB — MICROALBUMIN / CREATININE URINE RATIO
CREATININE, URINE: 98.8 mg/dL
Microalb Creat Ratio: 7.1 mg/g (ref 0.0–30.0)
Microalb, Ur: 0.7 mg/dL (ref <2.0)

## 2014-05-22 LAB — INSULIN, RANDOM: Insulin: 8.1 u[IU]/mL (ref 2.0–19.6)

## 2014-05-22 LAB — VITAMIN D 25 HYDROXY (VIT D DEFICIENCY, FRACTURES): Vit D, 25-Hydroxy: 17 ng/mL — ABNORMAL LOW (ref 30–100)

## 2014-05-29 ENCOUNTER — Other Ambulatory Visit: Payer: Self-pay | Admitting: Internal Medicine

## 2014-06-05 ENCOUNTER — Ambulatory Visit (HOSPITAL_COMMUNITY)
Admission: RE | Admit: 2014-06-05 | Discharge: 2014-06-05 | Disposition: A | Discharge status: Home / Self Care | Payer: Medicare Other | Source: Ambulatory Visit | Attending: Internal Medicine | Admitting: Internal Medicine

## 2014-06-05 ENCOUNTER — Other Ambulatory Visit: Payer: Self-pay | Admitting: Internal Medicine

## 2014-06-05 DIAGNOSIS — I10 Essential (primary) hypertension: Secondary | ICD-10-CM

## 2014-06-05 DIAGNOSIS — Z1231 Encounter for screening mammogram for malignant neoplasm of breast: Secondary | ICD-10-CM

## 2014-06-05 DIAGNOSIS — N6019 Diffuse cystic mastopathy of unspecified breast: Secondary | ICD-10-CM | POA: Insufficient documentation

## 2014-06-05 DIAGNOSIS — J449 Chronic obstructive pulmonary disease, unspecified: Secondary | ICD-10-CM

## 2014-06-25 ENCOUNTER — Other Ambulatory Visit: Payer: Self-pay | Admitting: Physical Medicine and Rehabilitation

## 2014-06-25 DIAGNOSIS — M5416 Radiculopathy, lumbar region: Secondary | ICD-10-CM

## 2014-07-02 ENCOUNTER — Ambulatory Visit
Admission: RE | Admit: 2014-07-02 | Discharge: 2014-07-02 | Disposition: A | Discharge status: Home / Self Care | Payer: Medicare Other | Source: Ambulatory Visit | Attending: Physical Medicine and Rehabilitation | Admitting: Physical Medicine and Rehabilitation

## 2014-07-02 DIAGNOSIS — M5416 Radiculopathy, lumbar region: Secondary | ICD-10-CM

## 2014-07-02 MED ORDER — GADOBENATE DIMEGLUMINE 529 MG/ML IV SOLN
14.0000 mL | Freq: Once | INTRAVENOUS | Status: AC | PRN
  Administered 2014-07-02: 14 mL via INTRAVENOUS

## 2014-08-21 ENCOUNTER — Encounter: Payer: Self-pay | Admitting: Internal Medicine

## 2014-08-21 ENCOUNTER — Ambulatory Visit (INDEPENDENT_AMBULATORY_CARE_PROVIDER_SITE_OTHER): Payer: Medicare Other | Admitting: Internal Medicine

## 2014-08-21 VITALS — BP 138/90 | HR 76 | Temp 98.4°F | Resp 16 | Ht 64.0 in | Wt 156.0 lb

## 2014-08-21 DIAGNOSIS — Z79899 Other long term (current) drug therapy: Secondary | ICD-10-CM

## 2014-08-21 DIAGNOSIS — E559 Vitamin D deficiency, unspecified: Secondary | ICD-10-CM

## 2014-08-21 DIAGNOSIS — I10 Essential (primary) hypertension: Secondary | ICD-10-CM

## 2014-08-21 DIAGNOSIS — E785 Hyperlipidemia, unspecified: Secondary | ICD-10-CM

## 2014-08-21 DIAGNOSIS — F17200 Nicotine dependence, unspecified, uncomplicated: Secondary | ICD-10-CM

## 2014-08-21 DIAGNOSIS — R7309 Other abnormal glucose: Secondary | ICD-10-CM

## 2014-08-21 DIAGNOSIS — N182 Chronic kidney disease, stage 2 (mild): Secondary | ICD-10-CM

## 2014-08-21 DIAGNOSIS — E663 Overweight: Secondary | ICD-10-CM

## 2014-08-21 DIAGNOSIS — R7303 Prediabetes: Secondary | ICD-10-CM

## 2014-08-21 LAB — CBC WITH DIFFERENTIAL/PLATELET
Basophils Absolute: 0 10*3/uL (ref 0.0–0.1)
Basophils Relative: 0 % (ref 0–1)
EOS ABS: 0.2 10*3/uL (ref 0.0–0.7)
Eosinophils Relative: 3 % (ref 0–5)
HCT: 41 % (ref 36.0–46.0)
Hemoglobin: 13.5 g/dL (ref 12.0–15.0)
LYMPHS ABS: 1.7 10*3/uL (ref 0.7–4.0)
Lymphocytes Relative: 29 % (ref 12–46)
MCH: 31.6 pg (ref 26.0–34.0)
MCHC: 32.9 g/dL (ref 30.0–36.0)
MCV: 96 fL (ref 78.0–100.0)
MONOS PCT: 6 % (ref 3–12)
MPV: 10.1 fL (ref 8.6–12.4)
Monocytes Absolute: 0.4 10*3/uL (ref 0.1–1.0)
Neutro Abs: 3.7 10*3/uL (ref 1.7–7.7)
Neutrophils Relative %: 62 % (ref 43–77)
Platelets: 297 10*3/uL (ref 150–400)
RBC: 4.27 MIL/uL (ref 3.87–5.11)
RDW: 13.3 % (ref 11.5–15.5)
WBC: 6 10*3/uL (ref 4.0–10.5)

## 2014-08-21 LAB — BASIC METABOLIC PANEL WITH GFR
BUN: 10 mg/dL (ref 6–23)
CALCIUM: 9.4 mg/dL (ref 8.4–10.5)
CO2: 24 meq/L (ref 19–32)
Chloride: 111 mEq/L (ref 96–112)
Creat: 0.74 mg/dL (ref 0.50–1.10)
GFR, Est Non African American: 89 mL/min
Glucose, Bld: 95 mg/dL (ref 70–99)
Potassium: 4 mEq/L (ref 3.5–5.3)
SODIUM: 141 meq/L (ref 135–145)

## 2014-08-21 LAB — HEPATIC FUNCTION PANEL
ALK PHOS: 94 U/L (ref 39–117)
ALT: 20 U/L (ref 0–35)
AST: 19 U/L (ref 0–37)
Albumin: 4.3 g/dL (ref 3.5–5.2)
Bilirubin, Direct: <0.1 mg/dL (ref 0.0–0.3)
TOTAL PROTEIN: 6.8 g/dL (ref 6.0–8.3)
Total Bilirubin: 0.3 mg/dL (ref 0.2–1.2)

## 2014-08-21 LAB — HEMOGLOBIN A1C
HEMOGLOBIN A1C: 5.5 % (ref <5.7)
Mean Plasma Glucose: 111 mg/dL (ref <117)

## 2014-08-21 LAB — LIPID PANEL
CHOLESTEROL: 229 mg/dL — AB (ref 0–200)
HDL: 56 mg/dL (ref >=46)
LDL Cholesterol: 134 mg/dL — ABNORMAL HIGH (ref 0–99)
Total CHOL/HDL Ratio: 4.1 Ratio
Triglycerides: 193 mg/dL — ABNORMAL HIGH (ref <150)
VLDL: 39 mg/dL (ref 0–40)

## 2014-08-21 LAB — MAGNESIUM: MAGNESIUM: 2.2 mg/dL (ref 1.5–2.5)

## 2014-08-21 LAB — TSH: TSH: 0.839 u[IU]/mL (ref 0.350–4.500)

## 2014-08-21 NOTE — Progress Notes (Signed)
Patient ID: Amber Hickman, female   DOB: 08-18-1955, 59 y.o.   MRN: [id number]  Assessment and Plan:    1. Essential hypertension -monitor at home -cont meds -DASH diet -call office if consistently greater than 160/90 - TSH  2. Prediabetes -discussed diet, exercise, and weight loss - Hemoglobin A1c - Insulin, random  3. CKD (chronic kidney disease) stage 2, GFR 60-89 ml/min -BMET w/ GFR  4. Hyperlipidemia -cont zocor - Lipid panel  5. Vitamin D deficiency -start supplement -told patient it may be related to aches and pains - Vit D  25 hydroxy (rtn osteoporosis monitoring)  6. Medication management  - CBC with Differential/Platelet - BASIC METABOLIC PANEL WITH GFR - Hepatic function panel - Magnesium  7. Tobacco use disorder -patient not ready to quit yet -told patient to contact office when she is ready to quit and that medications are available.  Continue diet and meds as discussed. Further disposition pending results of labs. Discussed med's effects and SE's.    HPI 59 y.o. female  presents for 3 month follow up with hypertension, hyperlipidemia, diabetes and vitamin D deficiency.   Her blood pressure has been controlled at home, today their BP is BP: 138/90 mmHg.She does not workout. She denies chest pain, shortness of breath, dizziness.   She is on cholesterol medication and denies myalgias. Her cholesterol is at goal. The cholesterol was:  05/21/2014: Cholesterol 181; HDL 62; LDL Cholesterol 83; Triglycerides 181*   She has been working on diet and exercise for diabetes with diabetic chronic kidney disease, she is not on bASA, she is not on ACE/ARB, and denies  foot ulcerations, hyperglycemia, hypoglycemia , increased appetite, nausea, paresthesia of the feet, polydipsia, polyuria, visual disturbances, vomiting and weight loss. Last A1C was: 05/21/2014: Hgb A1c MFr Bld 5.8*  She does report that she is eating twice daily.  She does snack in between.  She reports  that when she is eating she is doing a lot of ginger ale and also doing a lot of peanut butter.      Patient is on Vitamin D supplement. 05/21/2014: Vit D, 25-Hydroxy 17*   She does report that she has a ruptured disc at L3-4L4.  She reports that she has seen Dr. Patrice Paradise and is trying to decide whether she wants to have surgery or not.  She is due to have an epidural injection next month.    Patient does report some left sided chest pains which are intermittent and also are long term.  She reports that she has had it since childhood.  She has seen cardiology in the past.  She has had stress tests in the past which were negative.  Last stress test was in 2009.    She is still smoking.  She reports that she smokes 1/2 pack per day.    Current Medications:  Current Outpatient Prescriptions on File Prior to Visit  Medication Sig Dispense Refill  . albuterol (PROVENTIL HFA;VENTOLIN HFA) 108 (90 BASE) MCG/ACT inhaler Inhale 2 puffs into the lungs every 6 (six) hours as needed for wheezing or shortness of breath. 1 Inhaler 2  . cholecalciferol (VITAMIN D) 1000 UNITS tablet Take 5,000 Units by mouth daily.    . clobetasol (TEMOVATE) 0.05 % external solution apply 1 APPLICATION to affected area twice a day 50 mL 0  . FLUoxetine (PROZAC) 40 MG capsule take 1 capsule by mouth twice a day 60 capsule 5  . oxyCODONE-acetaminophen (PERCOCET) 10-325 MG per tablet Take 1  tablet by mouth every 6 (six) hours as needed for pain.    . simvastatin (ZOCOR) 40 MG tablet take 1 tablet by mouth at bedtime 30 tablet 0  . tiZANidine (ZANAFLEX) 2 MG tablet Take 2 mg by mouth every 6 (six) hours as needed for muscle spasms.    Marland Kitchen topiramate (TOPAMAX) 25 MG tablet Take 25 mg by mouth. Takes 2 at bedtime.    . vitamin B-12 (CYANOCOBALAMIN) 250 MCG tablet Take 250 mcg by mouth daily.    . ranitidine (ZANTAC) 150 MG tablet Take 1 tablet (150 mg total) by mouth 2 (two) times daily. (Patient not taking: Reported on 08/21/2014) 60  tablet 5   No current facility-administered medications on file prior to visit.   Medical History:  Past Medical History  Diagnosis Date  . Hypertension   . Hyperlipidemia   . Diabetes mellitus without complication   . Depression   . GERD (gastroesophageal reflux disease)   . Arthritis   . COPD (chronic obstructive pulmonary disease)   . Vitamin D deficiency   . AVN (avascular necrosis of bone)     Left shoulder  . Allergy    Allergies:  Allergies  Allergen Reactions  . Augmentin [Amoxicillin-Pot Clavulanate]   . Codeine   . Lipitor [Atorvastatin]     Leg pain  . Wellbutrin [Bupropion]   . Zoloft [Sertraline Hcl]      Review of Systems:  Review of Systems  Constitutional: Negative for fever, chills and malaise/fatigue.  HENT: Positive for congestion and ear discharge. Negative for ear pain and sore throat.   Eyes: Negative.   Respiratory: Negative for cough, shortness of breath and wheezing.   Cardiovascular: Positive for chest pain. Negative for palpitations and leg swelling.  Gastrointestinal: Negative for heartburn, nausea, vomiting, diarrhea, constipation, blood in stool and melena.  Genitourinary: Negative.   Musculoskeletal: Positive for back pain.  Skin: Negative.   Neurological: Negative for dizziness, sensory change, loss of consciousness and headaches.  Psychiatric/Behavioral: Negative for depression. The patient is not nervous/anxious and does not have insomnia.     Family history- Review and unchanged  Social history- Review and unchanged  Physical Exam: BP 138/90 mmHg  Pulse 76  Temp(Src) 98.4 F (36.9 C) (Temporal)  Resp 16  Ht 5\' 4"  (1.626 m)  Wt 156 lb (70.761 kg)  BMI 26.76 kg/m2 Wt Readings from Last 3 Encounters:  08/21/14 156 lb (70.761 kg)  05/21/14 153 lb (69.4 kg)  02/11/14 153 lb (69.4 kg)   General Appearance: Well nourished well developed, non-toxic appearing, in no apparent distress. Eyes: PERRLA, EOMs, conjunctiva no swelling  or erythema ENT/Mouth: Ear canals clear with no erythema, swelling, or discharge.  TMs normal bilaterally, oropharynx clear, moist, with no exudate.   Neck: Supple, thyroid normal, no JVD, no cervical adenopathy.  Respiratory: Respiratory effort normal, breath sounds clear A&P, no wheeze, rhonchi or rales noted.  No retractions, no accessory muscle usage Cardio: RRR with no MRGs. No noted edema.  Abdomen: Soft, + BS.  Non tender, no guarding, rebound, hernias, masses. Musculoskeletal: Full ROM, 5/5 strength, Antalgic gait Skin: Warm, dry without rashes, lesions, ecchymosis.  Neuro: Awake and oriented X 3, Cranial nerves intact. No cerebellar symptoms.  Psych: normal affect, Insight and Judgment appropriate.    FORCUCCI, Demetrica Zipp, PA-C 10:58 AM [facility]

## 2014-08-21 NOTE — Patient Instructions (Signed)
Nicotine Addiction Nicotine can act as both a stimulant (excites/activates) and a sedative (calms/quiets). Immediately after exposure to nicotine, there is a "kick" caused in part by the drug's stimulation of the adrenal glands and resulting discharge of adrenaline (epinephrine). The rush of adrenaline stimulates the body and causes a sudden release of sugar. This means that smokers are always slightly hyperglycemic. Hyperglycemic means that the blood sugar is high, just like in diabetics. Nicotine also decreases the amount of insulin which helps control sugar levels in the body. There is an increase in blood pressure, breathing, and the rate of heart beats.  In addition, nicotine indirectly causes a release of dopamine in the brain that controls pleasure and motivation. A similar reaction is seen with other drugs of abuse, such as cocaine and heroin. This dopamine release is thought to cause the pleasurable sensations when smoking. In some different cases, nicotine can also create a calming effect, depending on sensitivity of the smoker's nervous system and the dose of nicotine taken. WHAT HAPPENS WHEN NICOTINE IS TAKEN FOR LONG PERIODS OF TIME?  Long-term use of nicotine results in addiction. It is difficult to stop.  Repeated use of nicotine creates tolerance. Higher doses of nicotine are needed to get the "kick." When nicotine use is stopped, withdrawal may last a month or more. Withdrawal may begin within a few hours after the last cigarette. Symptoms peak within the first few days and may lessen within a few weeks. For some people, however, symptoms may last for months or longer. Withdrawal symptoms include:   Irritability.  Craving.  Learning and attention deficits.  Sleep disturbances.  Increased appetite. Craving for tobacco may last for 6 months or longer. Many behaviors done while using nicotine can also play a part in the severity of withdrawal symptoms. For some people, the feel,  smell, and sight of a cigarette and the ritual of obtaining, handling, lighting, and smoking the cigarette are closely linked with the pleasure of smoking. When stopped, they also miss the related behaviors which make the withdrawal or craving worse. While nicotine gum and patches may lessen the drug aspects of withdrawal, cravings often persist. WHAT ARE THE MEDICAL CONSEQUENCES OF NICOTINE USE?  Nicotine addiction accounts for one-third of all cancers. The top cancer caused by tobacco is lung cancer. Lung cancer is the number one cancer killer of both men and women.  Smoking is also associated with cancers of the:  Mouth.  Pharynx.  Larynx.  Esophagus.  Stomach.  Pancreas.  Cervix.  Kidney.  Ureter.  Bladder.  Smoking also causes lung diseases such as lasting (chronic) bronchitis and emphysema.  It worsens asthma in adults and children.  Smoking increases the risk of heart disease, including:  Stroke.  Heart attack.  Vascular disease.  Aneurysm.  Passive or secondary smoke can also increase medical risks including:  Asthma in children.  Sudden Infant Death Syndrome (SIDS).  Additionally, dropped cigarettes are the leading cause of residential fire fatalities.  Nicotine poisoning has been reported from accidental ingestion of tobacco products by children and pets. Death usually results in a few minutes from respiratory failure (when a person stops breathing) caused by paralysis. TREATMENT   Medication. Nicotine replacement medicines such as nicotine gum and the patch are used to stop smoking. These medicines gradually lower the dosage of nicotine in the body. These medicines do not contain the carbon monoxide and other toxins found in tobacco smoke.  Hypnotherapy.  Relaxation therapy.  Nicotine Anonymous (a 12-step support   program). Find times and locations in your local yellow pages. Document Released: 10/08/2003 Document Revised: 04/26/2011 Document  Reviewed: 03/30/2013 Exit[employer] Patient Information 2015 [employer]. This information is not intended to replace advice given to you by your health care provider. Make sure you discuss any questions you have with your health care provider.  

## 2014-08-22 LAB — INSULIN, RANDOM: Insulin: 9.7 u[IU]/mL (ref 2.0–19.6)

## 2014-08-22 LAB — VITAMIN D 25 HYDROXY (VIT D DEFICIENCY, FRACTURES): VIT D 25 HYDROXY: 23 ng/mL — AB (ref 30–100)

## 2014-09-23 ENCOUNTER — Other Ambulatory Visit: Payer: Self-pay | Admitting: Internal Medicine

## 2014-11-21 ENCOUNTER — Encounter: Payer: Self-pay | Admitting: Internal Medicine

## 2014-11-21 ENCOUNTER — Ambulatory Visit (INDEPENDENT_AMBULATORY_CARE_PROVIDER_SITE_OTHER): Payer: Medicare Other | Admitting: Internal Medicine

## 2014-11-21 VITALS — BP 116/72 | HR 72 | Temp 97.6°F | Resp 16 | Ht 64.0 in | Wt 152.0 lb

## 2014-11-21 DIAGNOSIS — I10 Essential (primary) hypertension: Secondary | ICD-10-CM

## 2014-11-21 DIAGNOSIS — Z0000 Encounter for general adult medical examination without abnormal findings: Secondary | ICD-10-CM

## 2014-11-21 DIAGNOSIS — Z79899 Other long term (current) drug therapy: Secondary | ICD-10-CM

## 2014-11-21 DIAGNOSIS — E559 Vitamin D deficiency, unspecified: Secondary | ICD-10-CM | POA: Diagnosis not present

## 2014-11-21 DIAGNOSIS — N182 Chronic kidney disease, stage 2 (mild): Secondary | ICD-10-CM | POA: Diagnosis not present

## 2014-11-21 DIAGNOSIS — G894 Chronic pain syndrome: Secondary | ICD-10-CM | POA: Diagnosis not present

## 2014-11-21 DIAGNOSIS — R7303 Prediabetes: Secondary | ICD-10-CM

## 2014-11-21 DIAGNOSIS — Z6826 Body mass index (BMI) 26.0-26.9, adult: Secondary | ICD-10-CM

## 2014-11-21 DIAGNOSIS — E785 Hyperlipidemia, unspecified: Secondary | ICD-10-CM

## 2014-11-21 DIAGNOSIS — J42 Unspecified chronic bronchitis: Secondary | ICD-10-CM

## 2014-11-21 LAB — BASIC METABOLIC PANEL WITH GFR
BUN: 8 mg/dL (ref 7–25)
CO2: 24 mmol/L (ref 20–31)
CREATININE: 0.84 mg/dL (ref 0.50–1.05)
Calcium: 9.5 mg/dL (ref 8.6–10.4)
Chloride: 109 mmol/L (ref 98–110)
GFR, Est African American: 88 mL/min (ref >=60)
GFR, Est Non African American: 76 mL/min (ref >=60)
Glucose, Bld: 98 mg/dL (ref 65–99)
Potassium: 3.9 mmol/L (ref 3.5–5.3)
Sodium: 141 mmol/L (ref 135–146)

## 2014-11-21 LAB — CBC WITH DIFFERENTIAL/PLATELET
BASOS PCT: 0 % (ref 0–1)
Basophils Absolute: 0 10*3/uL (ref 0.0–0.1)
EOS ABS: 0.2 10*3/uL (ref 0.0–0.7)
Eosinophils Relative: 2 % (ref 0–5)
HCT: 39.7 % (ref 36.0–46.0)
Hemoglobin: 13.1 g/dL (ref 12.0–15.0)
Lymphocytes Relative: 22 % (ref 12–46)
Lymphs Abs: 1.7 10*3/uL (ref 0.7–4.0)
MCH: 30.7 pg (ref 26.0–34.0)
MCHC: 33 g/dL (ref 30.0–36.0)
MCV: 93 fL (ref 78.0–100.0)
MONOS PCT: 7 % (ref 3–12)
MPV: 10.4 fL (ref 8.6–12.4)
Monocytes Absolute: 0.5 10*3/uL (ref 0.1–1.0)
NEUTROS PCT: 69 % (ref 43–77)
Neutro Abs: 5.3 10*3/uL (ref 1.7–7.7)
PLATELETS: 290 10*3/uL (ref 150–400)
RBC: 4.27 MIL/uL (ref 3.87–5.11)
RDW: 13.3 % (ref 11.5–15.5)
WBC: 7.7 10*3/uL (ref 4.0–10.5)

## 2014-11-21 LAB — LIPID PANEL
CHOL/HDL RATIO: 3.1 ratio (ref <=5.0)
Cholesterol: 157 mg/dL (ref 125–200)
HDL: 50 mg/dL (ref >=46)
LDL Cholesterol: 84 mg/dL (ref <130)
Triglycerides: 113 mg/dL (ref <150)
VLDL: 23 mg/dL (ref <30)

## 2014-11-21 LAB — HEMOGLOBIN A1C
Hgb A1c MFr Bld: 5.8 % — ABNORMAL HIGH (ref <5.7)
Mean Plasma Glucose: 120 mg/dL — ABNORMAL HIGH (ref <117)

## 2014-11-21 LAB — HEPATIC FUNCTION PANEL
ALT: 24 U/L (ref 6–29)
AST: 25 U/L (ref 10–35)
Albumin: 4.3 g/dL (ref 3.6–5.1)
Alkaline Phosphatase: 89 U/L (ref 33–130)
BILIRUBIN DIRECT: 0.1 mg/dL (ref <=0.2)
BILIRUBIN INDIRECT: 0.4 mg/dL (ref 0.2–1.2)
BILIRUBIN TOTAL: 0.5 mg/dL (ref 0.2–1.2)
Total Protein: 6.7 g/dL (ref 6.1–8.1)

## 2014-11-21 LAB — TSH: TSH: 0.663 u[IU]/mL (ref 0.350–4.500)

## 2014-11-21 LAB — MAGNESIUM: MAGNESIUM: 2 mg/dL (ref 1.5–2.5)

## 2014-11-21 NOTE — Progress Notes (Signed)
Patient ID: Amber Hickman, female   DOB: Aug 28, 1955, 59 y.o.   MRN: [id number]   This very nice 59 y.o. SWF presents for 6 month follow up with Hypertension, Hyperlipidemia, Pre-Diabetes and Vitamin D Deficiency. Patient also has chronic Pain Syndrome due to Lumbar DDD and is followed in pain management. Other problems include COPD with patient recalcitrant regarding not    Patient is treated for HTN since 1998 & BP has been controlled at home. Today's BP: 116/72 mmHg. Patient has had no complaints of any cardiac type chest pain, palpitations, dyspnea/orthopnea/PND, dizziness, claudication, or dependent edema.   Hyperlipidemia is controlled with diet & meds. Patient denies myalgias or other med SE's. Last Lipids were were at goal with Cholesterol 157; HDL 50; LDL 84; Triglycerides 113 on 11/21/2014.    Also, the patient has history of PreDiabetes and has had no symptoms of reactive hypoglycemia, diabetic polys, paresthesias or visual blurring.  Last A1c was 5.8% on 11/21/2014.    Further, the patient also has history of Vitamin D Deficiency of 20 in 2008 and supplements vitamin D without any suspected side-effects. Last vitamin D was  23 on 08/21/2014.  Medication Sig  . albuterol  HFA) 108  inhaler Inhale 2 puffs into the lungs every 6 (six) hours as needed for wheezing or shortness of breath.  Marland Kitchen VITAMIN D 1000 UNITS tablet Take 5,000 Units by mouth daily.  . clobetasol (TEMOVATE) 0.05 % ext soln apply 1 APPLICATION to affected area twice a day  . FLUoxetine 40 MG capsule take 1 capsule by mouth twice a day  . PERCOCET 10-325 MG  Take 1 tablet by mouth every 6 (six) hours as needed for pain.  . simvastatin (40 MG tablet take 1 tablet by mouth at bedtime  . tiZANidine (ZANAFLEX) 2 MG tablet Take 2 mg by mouth every 6 (six) hours as needed for muscle spasms.  Marland Kitchen topiramate (TOPAMAX) 25 MG tablet Take 25 mg by mouth. Takes 2 at bedtime.  . vitamin B-12  250 MCG tablet Take 250 mcg by mouth daily.  .  ranitidine (ZANTAC) 150 MG tablet Take 1 tablet (150 mg total) by mouth 2 (two) times daily. (Patient not taking: Reported on 08/21/2014)   Allergies  Allergen Reactions  . Augmentin [Amoxicillin-Pot Clavulanate]   . Codeine   . Lipitor [Atorvastatin]     Leg pain  . Wellbutrin [Bupropion]   . Zoloft [Sertraline Hcl]    PMHx:   Past Medical History  Diagnosis Date  . Hypertension   . Hyperlipidemia   . Diabetes mellitus without complication ([place])   . Depression   . GERD (gastroesophageal reflux disease)   . Arthritis   . COPD (chronic obstructive pulmonary disease) ([place])   . Vitamin D deficiency   . AVN (avascular necrosis of bone) ([facility])     Left shoulder  . Allergy    Immunization History  Administered Date(s) Administered  . Td 01/19/2004   Past Surgical History  Procedure Laterality Date  . Cataract extraction Bilateral   . Spine surgery      lumbar  . Ankle surgery Right   . Tonsillectomy and adenoidectomy     FHx:    Reviewed / unchanged  SHx:    Reviewed / unchanged  Systems Review:  Constitutional: Denies fever, chills, wt changes, headaches, insomnia, fatigue, night sweats, change in appetite. Eyes: Denies redness, blurred vision, diplopia, discharge, itchy, watery eyes.  ENT: Denies discharge, congestion, post nasal drip, epistaxis, sore throat, earache,  hearing loss, dental pain, tinnitus, vertigo, sinus pain, snoring.  CV: Denies chest pain, palpitations, irregular heartbeat, syncope, dyspnea, diaphoresis, orthopnea, PND, claudication or edema. Respiratory: denies cough, dyspnea, DOE, pleurisy, hoarseness, laryngitis, wheezing.  Gastrointestinal: Denies dysphagia, odynophagia, heartburn, reflux, water brash, abdominal pain or cramps, nausea, vomiting, bloating, diarrhea, constipation, hematemesis, melena, hematochezia  or hemorrhoids. Genitourinary: Denies dysuria, frequency, urgency, nocturia, hesitancy, discharge, hematuria or flank pain. Musculoskeletal:  Denies arthralgias, myalgias, stiffness, jt. swelling, pain, limping or strain/sprain.  Skin: Denies pruritus, rash, hives, warts, acne, eczema or change in skin lesion(s). Neuro: No weakness, tremor, incoordination, spasms, paresthesia or pain. Psychiatric: Denies confusion, memory loss or sensory loss. Endo: Denies change in weight, skin or hair change.  Heme/Lymph: No excessive bleeding, bruising or enlarged lymph nodes.  Physical Exam  BP 116/72 mmHg  Pulse 72  Temp(Src) 97.6 F (36.4 C)  Resp 16  Ht 5\' 4"  (1.626 m)  Wt 152 lb (68.947 kg)  BMI 26.08 kg/m2  Appears well nourished and in no distress. Eyes: PERRLA, EOMs, conjunctiva no swelling or erythema. Sinuses: No frontal/maxillary tenderness ENT/Mouth: EAC's clear, TM's nl w/o erythema, bulging. Nares clear w/o erythema, swelling, exudates. Oropharynx clear without erythema or exudates. Oral hygiene is good. Tongue normal, non obstructing. Hearing intact.  Neck: Supple. Thyroid nl. Car 2+/2+ without bruits, nodes or JVD. Chest: Respirations nl with BS clear & equal w/o rales, rhonchi, wheezing or stridor.  Cor: Heart sounds normal w/ regular rate and rhythm without sig. murmurs, gallops, clicks, or rubs. Peripheral pulses normal and equal  without edema.  Abdomen: Soft & bowel sounds normal. Non-tender w/o guarding, rebound, hernias, masses, or organomegaly.  Lymphatics: Unremarkable.  Musculoskeletal: Full ROM all peripheral extremities, joint stability, 5/5 strength, and normal gait.  Skin: Warm, dry without exposed rashes, lesions or ecchymosis apparent.  Neuro: Cranial nerves intact, reflexes equal bilaterally. Sensory-motor testing grossly intact. Tendon reflexes grossly intact.  Pysch: Alert & oriented x 3.  Insight and judgement nl & appropriate. No ideations.  Assessment and Plan:  1. Essential hypertension  - TSH  2. Hyperlipidemia  - Lipid panel  3. Prediabetes  - Hemoglobin A1c - Insulin, random  4.  Vitamin D deficiency  - Vit D  25 hydroxy   5. CKD (chronic kidney disease) stage 2, GFR 60-89 ml/min   6. Chronic pain syndrome   7. Chronic bronchitis, unspecified chronic bronchitis type ([place])  - CBC with Differential/Platelet - BASIC METABOLIC PANEL WITH GFR - Hepatic function panel - Magnesium  8. BMI 26.08,   adult   9. Medication management   Recommended regular exercise, BP monitoring, weight control, and discussed med and SE's. Recommended labs to assess and monitor clinical status. Further disposition pending results of labs. Over 30 minutes of exam, counseling, chart review was performed

## 2014-11-21 NOTE — Patient Instructions (Signed)

## 2014-11-22 LAB — VITAMIN D 25 HYDROXY (VIT D DEFICIENCY, FRACTURES): Vit D, 25-Hydroxy: 61 ng/mL (ref 30–100)

## 2014-11-22 LAB — INSULIN, RANDOM: Insulin: 12.3 u[IU]/mL (ref 2.0–19.6)

## 2014-12-12 ENCOUNTER — Other Ambulatory Visit: Payer: Self-pay | Admitting: *Deleted

## 2014-12-12 ENCOUNTER — Other Ambulatory Visit: Payer: Self-pay | Admitting: Internal Medicine

## 2014-12-24 LAB — COLOGUARD

## 2015-02-26 ENCOUNTER — Encounter: Payer: Self-pay | Admitting: Physician Assistant

## 2015-02-26 ENCOUNTER — Ambulatory Visit (INDEPENDENT_AMBULATORY_CARE_PROVIDER_SITE_OTHER): Payer: Medicare Other | Admitting: Physician Assistant

## 2015-02-26 VITALS — BP 120/70 | HR 108 | Temp 97.7°F | Resp 16 | Ht 64.0 in | Wt 148.0 lb

## 2015-02-26 DIAGNOSIS — I10 Essential (primary) hypertension: Secondary | ICD-10-CM

## 2015-02-26 DIAGNOSIS — N182 Chronic kidney disease, stage 2 (mild): Secondary | ICD-10-CM

## 2015-02-26 DIAGNOSIS — R197 Diarrhea, unspecified: Secondary | ICD-10-CM | POA: Diagnosis not present

## 2015-02-26 DIAGNOSIS — Z79899 Other long term (current) drug therapy: Secondary | ICD-10-CM | POA: Diagnosis not present

## 2015-02-26 DIAGNOSIS — J42 Unspecified chronic bronchitis: Secondary | ICD-10-CM | POA: Diagnosis not present

## 2015-02-26 DIAGNOSIS — R7309 Other abnormal glucose: Secondary | ICD-10-CM

## 2015-02-26 DIAGNOSIS — F17200 Nicotine dependence, unspecified, uncomplicated: Secondary | ICD-10-CM

## 2015-02-26 DIAGNOSIS — G894 Chronic pain syndrome: Secondary | ICD-10-CM

## 2015-02-26 DIAGNOSIS — E559 Vitamin D deficiency, unspecified: Secondary | ICD-10-CM | POA: Diagnosis not present

## 2015-02-26 DIAGNOSIS — E785 Hyperlipidemia, unspecified: Secondary | ICD-10-CM | POA: Diagnosis not present

## 2015-02-26 LAB — CBC WITH DIFFERENTIAL/PLATELET
Basophils Absolute: 0 10*3/uL (ref 0.0–0.1)
Basophils Relative: 0 % (ref 0–1)
Eosinophils Absolute: 0.2 10*3/uL (ref 0.0–0.7)
Eosinophils Relative: 2 % (ref 0–5)
HCT: 42.7 % (ref 36.0–46.0)
HEMOGLOBIN: 14.4 g/dL (ref 12.0–15.0)
LYMPHS PCT: 31 % (ref 12–46)
Lymphs Abs: 2.4 10*3/uL (ref 0.7–4.0)
MCH: 31.4 pg (ref 26.0–34.0)
MCHC: 33.7 g/dL (ref 30.0–36.0)
MCV: 93 fL (ref 78.0–100.0)
MPV: 10.3 fL (ref 8.6–12.4)
Monocytes Absolute: 0.4 10*3/uL (ref 0.1–1.0)
Monocytes Relative: 5 % (ref 3–12)
NEUTROS ABS: 4.8 10*3/uL (ref 1.7–7.7)
Neutrophils Relative %: 62 % (ref 43–77)
Platelets: 335 10*3/uL (ref 150–400)
RBC: 4.59 MIL/uL (ref 3.87–5.11)
RDW: 13.6 % (ref 11.5–15.5)
WBC: 7.8 10*3/uL (ref 4.0–10.5)

## 2015-02-26 LAB — BASIC METABOLIC PANEL WITH GFR
BUN: 13 mg/dL (ref 7–25)
CHLORIDE: 111 mmol/L — AB (ref 98–110)
CO2: 23 mmol/L (ref 20–31)
Calcium: 10 mg/dL (ref 8.6–10.4)
Creat: 0.96 mg/dL (ref 0.50–0.99)
GFR, EST NON AFRICAN AMERICAN: 64 mL/min (ref >=60)
GFR, Est African American: 74 mL/min (ref >=60)
GLUCOSE: 94 mg/dL (ref 65–99)
Potassium: 4.9 mmol/L (ref 3.5–5.3)
Sodium: 141 mmol/L (ref 135–146)

## 2015-02-26 LAB — HEPATIC FUNCTION PANEL
ALT: 18 U/L (ref 6–29)
AST: 16 U/L (ref 10–35)
Albumin: 4.5 g/dL (ref 3.6–5.1)
Alkaline Phosphatase: 102 U/L (ref 33–130)
BILIRUBIN DIRECT: 0.1 mg/dL (ref <=0.2)
Indirect Bilirubin: 0.3 mg/dL (ref 0.2–1.2)
TOTAL PROTEIN: 7.1 g/dL (ref 6.1–8.1)
Total Bilirubin: 0.4 mg/dL (ref 0.2–1.2)

## 2015-02-26 LAB — LIPID PANEL
CHOL/HDL RATIO: 3.6 ratio (ref <=5.0)
Cholesterol: 200 mg/dL (ref 125–200)
HDL: 55 mg/dL (ref >=46)
LDL Cholesterol: 104 mg/dL (ref <130)
Triglycerides: 203 mg/dL — ABNORMAL HIGH (ref <150)
VLDL: 41 mg/dL — AB (ref <30)

## 2015-02-26 LAB — MAGNESIUM: MAGNESIUM: 2.2 mg/dL (ref 1.5–2.5)

## 2015-02-26 MED ORDER — METRONIDAZOLE 500 MG PO TABS: 500.0000 mg | ORAL_TABLET | Freq: Three times a day (TID) | ORAL | Status: AC

## 2015-02-26 MED ORDER — CIPROFLOXACIN HCL 500 MG PO TABS: 500.0000 mg | ORAL_TABLET | Freq: Two times a day (BID) | ORAL | Status: AC

## 2015-02-26 MED ORDER — VARENICLINE TARTRATE 1 MG PO TABS: 1.0000 mg | ORAL_TABLET | Freq: Two times a day (BID) | ORAL | Status: DC

## 2015-02-26 NOTE — Patient Instructions (Signed)
If you have a smart phone, please look up Smoke Free app, this will help you stay on track and give you information about money you have saved, life that you have gained back and a ton of more information.   We are giving you chantix for smoking cessation. You can do it! And we are here to help! You may have heard some scary side effects about chantix, the three most common I hear about are nausea, crazy dreams and depression.  However, I like for my patients to try to stay on 1/2 a tablet twice a day rather than one tablet twice a day as normally prescribed. This helps decrease the chances of side effects and helps save money by making a one month prescription last two months  Please start the prescription this way:  Start 1/2 tablet by mouth once daily after food with a full glass of water for 3 days Then do 1/2 tablet by mouth twice daily for 4 days. During this first week you can smoke, but try to stop after this week.  At this point we have several options: 1) continue on 1/2 tablet twice a day- which I encourage you to do. You can stay on this dose the rest of the time on the medication or if you still feel the need to smoke you can do one of the two options below. 2) do one tablet in the morning and 1/2 in the evening which helps decrease dreams. 3) do one tablet twice a day.   What if I miss a dose? If you miss a dose, take it as soon as you can. If it is almost time for your next dose, take only that dose. Do not take double or extra doses.  What should I watch for while using this medicine? Visit your doctor or health care professional for regular check ups. Ask for ongoing advice and encouragement from your doctor or healthcare professional, friends, and family to help you quit. If you smoke while on this medication, quit again  Your mouth may get dry. Chewing sugarless gum or hard candy, and drinking plenty of water may help. Contact your doctor if the problem does not go away or is  severe.  You may get drowsy or dizzy. Do not drive, use machinery, or do anything that needs mental alertness until you know how this medicine affects you. Do not stand or sit up quickly, especially if you are an older patient.   The use of this medicine may increase the chance of suicidal thoughts or actions. Pay special attention to how you are responding while on this medicine. Any worsening of mood, or thoughts of suicide or dying should be reported to your health care professional right away.  ADVANTAGES OF QUITTING SMOKING  Within 20 minutes, blood pressure decreases. Your pulse is at normal level.  After 8 hours, carbon monoxide levels in the blood return to normal. Your oxygen level increases.  After 24 hours, the chance of having a heart attack starts to decrease. Your breath, hair, and body stop smelling like smoke.  After 48 hours, damaged nerve endings begin to recover. Your sense of taste and smell improve.  After 72 hours, the body is virtually free of nicotine. Your bronchial tubes relax and breathing becomes easier.  After 2 to 12 weeks, lungs can hold more air. Exercise becomes easier and circulation improves.  After 1 year, the risk of coronary heart disease is cut in half.  After 5 years,   the risk of stroke falls to the same as a nonsmoker.  After 10 years, the risk of lung cancer is cut in half and the risk of other cancers decreases significantly.  After 15 years, the risk of coronary heart disease drops, usually to the level of a nonsmoker.  You will have extra money to spend on things other than cigarettes.  .Diverticulitis Diverticulitis is inflammation or infection of small pouches in your colon that form when you have a condition called diverticulosis. The pouches in your colon are called diverticula. Your colon, or large intestine, is where water is absorbed and stool is formed. Complications of diverticulitis can include:  Bleeding.  Severe  infection.  Severe pain.  Perforation of your colon.  Obstruction of your colon. CAUSES  Diverticulitis is caused by bacteria. Diverticulitis happens when stool becomes trapped in diverticula. This allows bacteria to grow in the diverticula, which can lead to inflammation and infection. RISK FACTORS People with diverticulosis are at risk for diverticulitis. Eating a diet that does not include enough fiber from fruits and vegetables may make diverticulitis more likely to develop. SYMPTOMS  Symptoms of diverticulitis may include:  Abdominal pain and tenderness. The pain is normally located on the left side of the abdomen, but may occur in other areas.  Fever and chills.  Bloating.  Cramping.  Nausea.  Vomiting.  Constipation.  Diarrhea.  Blood in your stool. DIAGNOSIS  Your health care provider will ask you about your medical history and do a physical exam. You may need to have tests done because many medical conditions can cause the same symptoms as diverticulitis. Tests may include:  Blood tests.  Urine tests.  Imaging tests of the abdomen, including X-rays and CT scans. When your condition is under control, your health care provider may recommend that you have a colonoscopy. A colonoscopy can show how severe your diverticula are and whether something else is causing your symptoms. TREATMENT  Most cases of diverticulitis are mild and can be treated at home. Treatment may include:  Taking over-the-counter pain medicines.  Following a clear liquid diet.  Taking antibiotic medicines by mouth for 7-10 days. More severe cases may be treated at a hospital. Treatment may include:  Not eating or drinking.  Taking prescription pain medicine.  Receiving antibiotic medicines through an IV tube.  Receiving fluids and nutrition through an IV tube.  Surgery. HOME CARE INSTRUCTIONS   Follow your health care provider's instructions carefully.  Follow a full liquid diet  or other diet as directed by your health care provider. After your symptoms improve, your health care provider may tell you to change your diet. He or she may recommend you eat a high-fiber diet. Fruits and vegetables are good sources of fiber. Fiber makes it easier to pass stool.  Take fiber supplements or probiotics as directed by your health care provider.  Only take medicines as directed by your health care provider.  Keep all your follow-up appointments. SEEK MEDICAL CARE IF:   Your pain does not improve.  You have a hard time eating food.  Your bowel movements do not return to normal. SEEK IMMEDIATE MEDICAL CARE IF:   Your pain becomes worse.  Your symptoms do not get better.  Your symptoms suddenly get worse.  You have a fever.  You have repeated vomiting.  You have bloody or black, tarry stools. MAKE SURE YOU:   Understand these instructions.  Will watch your condition.  Will get help right away if you  are not doing well or get worse.   This information is not intended to replace advice given to you by your health care provider. Make sure you discuss any questions you have with your health care provider.   Document Released: 11/11/2004 Document Revised: 02/06/2013 Document Reviewed: 12/27/2012 Elsevier Interactive Patient Education [employer].

## 2015-02-26 NOTE — Progress Notes (Signed)
Assessment and Plan:  Hypertension: Continue medication, monitor blood pressure at home. Continue DASH diet.  Reminder to go to the ER if any CP, SOB, nausea, dizziness, severe HA, changes vision/speech, left arm numbness and tingling, and jaw pain. CKD due to HTN stage II-Increase fluids, avoid NSAIDS, monitor sugars, will monitor Cholesterol: Continue diet and exercise. Check cholesterol.  Pre-diabetes-Continue diet and exercise. Check A1C Vitamin D Def- check level and continue medications.  Diarrhea- check labs, will treat for possible diverticulitis with cipro/flaygl Smoking cessation- sent in chantix and printed out directions  Continue diet and meds as discussed. Further disposition pending results of labs.  HPI 60 y.o. female  presents for 3 month follow up with hypertension, hyperlipidemia, prediabetes and vitamin D. Her blood pressure has been controlled at home, today their BP is BP: 120/70 mmHg  She has CKD stage II due to HTN.  She does workout. She denies chest pain, shortness of breath, dizziness.  She has COPD, continues to smoke but is interested in quitting  She is on cholesterol medication and denies myalgias. Her cholesterol is at goal. The cholesterol last visit was:   Lab Results  Component Value Date   CHOL 157 11/21/2014   HDL 50 11/21/2014   LDLCALC 84 11/21/2014   TRIG 113 11/21/2014   CHOLHDL 3.1 11/21/2014   She has been working on diet and exercise for prediabetes, and denies paresthesia of the feet, polydipsia, polyuria and visual disturbances. Last A1C in the office was:  Lab Results  Component Value Date   HGBA1C 5.8* 11/21/2014   Patient is on Vitamin D supplement.   Lab Results  Component Value Date   VD25OH 61 11/21/2014   She follows with pain management.  Had negative cologuard last year. Has had diarrhea x 1 week with LLQ pain. No fever, chills.    Current Medications:  Current Outpatient Prescriptions on File Prior to Visit  Medication  Sig Dispense Refill  . albuterol (PROVENTIL HFA;VENTOLIN HFA) 108 (90 BASE) MCG/ACT inhaler Inhale 2 puffs into the lungs every 6 (six) hours as needed for wheezing or shortness of breath. 1 Inhaler 2  . cholecalciferol (VITAMIN D) 1000 UNITS tablet Take 5,000 Units by mouth daily.    . clobetasol (TEMOVATE) 0.05 % external solution apply 1 APPLICATION to affected area twice a day 50 mL 0  . FLUoxetine (PROZAC) 40 MG capsule take 1 capsule by mouth twice a day 60 capsule 5  . gabapentin (NEURONTIN) 100 MG capsule Take 100 mg by mouth 3 (three) times daily.    Marland Kitchen oxyCODONE-acetaminophen (PERCOCET) 10-325 MG per tablet Take 1 tablet by mouth every 6 (six) hours as needed for pain.    . simvastatin (ZOCOR) 40 MG tablet take 1 tablet by mouth at bedtime 30 tablet 3  . tiZANidine (ZANAFLEX) 2 MG tablet Take 2 mg by mouth every 6 (six) hours as needed for muscle spasms.    Marland Kitchen topiramate (TOPAMAX) 25 MG tablet Take 25 mg by mouth. Takes 2 at bedtime.    . vitamin B-12 (CYANOCOBALAMIN) 250 MCG tablet Take 250 mcg by mouth daily.    . ranitidine (ZANTAC) 150 MG tablet Take 1 tablet (150 mg total) by mouth 2 (two) times daily. (Patient not taking: Reported on 08/21/2014) 60 tablet 5   No current facility-administered medications on file prior to visit.   Medical History:  Past Medical History  Diagnosis Date  . Hypertension   . Hyperlipidemia   . Diabetes mellitus without complication ([place])   .  Depression   . GERD (gastroesophageal reflux disease)   . Arthritis   . COPD (chronic obstructive pulmonary disease) ([place])   . Vitamin D deficiency   . AVN (avascular necrosis of bone) ([facility])     Left shoulder  . Allergy    Allergies:  Allergies  Allergen Reactions  . Augmentin [Amoxicillin-Pot Clavulanate]   . Codeine   . Lipitor [Atorvastatin]     Leg pain  . Wellbutrin [Bupropion]   . Zoloft [Sertraline Hcl]      Review of Systems:  Review of Systems  Constitutional: Negative.   HENT: Negative  for congestion, ear discharge, ear pain, hearing loss, nosebleeds, sore throat and tinnitus.   Eyes: Negative.   Respiratory: Negative.  Negative for stridor.   Cardiovascular: Negative.   Gastrointestinal: Negative.   Genitourinary: Negative.   Musculoskeletal: Positive for myalgias, back pain, joint pain and neck pain. Negative for falls.  Skin: Negative.   Neurological: Positive for dizziness. Negative for tingling, tremors, sensory change, speech change, focal weakness, seizures, loss of consciousness and headaches.  Psychiatric/Behavioral: Negative.      Family history- Review and unchanged Social history- Review and unchanged Physical Exam: BP 120/70 mmHg  Pulse 108  Temp(Src) 97.7 F (36.5 C) (Temporal)  Resp 16  Ht 5\' 4"  (1.626 m)  Wt 148 lb (67.132 kg)  BMI 25.39 kg/m2  SpO2 96% Wt Readings from Last 3 Encounters:  02/26/15 148 lb (67.132 kg)  11/21/14 152 lb (68.947 kg)  08/21/14 156 lb (70.761 kg)   General Appearance: Well nourished, in no apparent distress. Eyes: PERRLA, EOMs, conjunctiva no swelling or erythema Sinuses: No Frontal/maxillary tenderness ENT/Mouth: Ext aud canals clear, TMs without erythema, bulging. No erythema, swelling, or exudate on post pharynx.  Tonsils not swollen or erythematous. Hearing normal.  Neck: Supple, thyroid normal.  Respiratory: Respiratory effort normal, BS equal bilaterally without rales, rhonchi, wheezing or stridor.  Cardio: RRR with no MRGs. Brisk peripheral pulses without edema.  Abdomen: Soft, + BS, mild LLQ  tenderness, no guarding, rebound, hernias, masses. Lymphatics: Non tender without lymphadenopathy.  Musculoskeletal: Full ROM, 5/5 strength, normal gait.  Skin: Warm, dry without rashes, lesions, ecchymosis.  Neuro: Cranial nerves intact. Normal muscle tone, no cerebellar symptoms. Sensation intact.  Psych: Awake and oriented X 3, normal affect, Insight and Judgment appropriate.    Vicie Mutters, PA-C 10:27  AM [facility]

## 2015-02-27 LAB — HEMOGLOBIN A1C
HEMOGLOBIN A1C: 5.7 % — AB (ref <5.7)
MEAN PLASMA GLUCOSE: 117 mg/dL — AB (ref <117)

## 2015-02-27 LAB — TSH: TSH: 1.652 u[IU]/mL (ref 0.350–4.500)

## 2015-02-27 LAB — VITAMIN D 25 HYDROXY (VIT D DEFICIENCY, FRACTURES): Vit D, 25-Hydroxy: 53 ng/mL (ref 30–100)

## 2015-04-28 ENCOUNTER — Observation Stay (HOSPITAL_COMMUNITY)
Admission: EM | Admit: 2015-04-28 | Discharge: 2015-05-01 | Disposition: A | Discharge status: Home / Self Care | Payer: Medicare Other | Attending: Internal Medicine | Admitting: Internal Medicine

## 2015-04-28 ENCOUNTER — Emergency Department (HOSPITAL_COMMUNITY): Payer: Medicare Other

## 2015-04-28 ENCOUNTER — Encounter (HOSPITAL_COMMUNITY): Payer: Self-pay | Admitting: Family Medicine

## 2015-04-28 DIAGNOSIS — Z79891 Long term (current) use of opiate analgesic: Secondary | ICD-10-CM | POA: Insufficient documentation

## 2015-04-28 DIAGNOSIS — S0181XA Laceration without foreign body of other part of head, initial encounter: Secondary | ICD-10-CM | POA: Diagnosis not present

## 2015-04-28 DIAGNOSIS — R079 Chest pain, unspecified: Secondary | ICD-10-CM

## 2015-04-28 DIAGNOSIS — F17200 Nicotine dependence, unspecified, uncomplicated: Secondary | ICD-10-CM | POA: Insufficient documentation

## 2015-04-28 DIAGNOSIS — F329 Major depressive disorder, single episode, unspecified: Secondary | ICD-10-CM | POA: Insufficient documentation

## 2015-04-28 DIAGNOSIS — Z8249 Family history of ischemic heart disease and other diseases of the circulatory system: Secondary | ICD-10-CM | POA: Insufficient documentation

## 2015-04-28 DIAGNOSIS — J449 Chronic obstructive pulmonary disease, unspecified: Secondary | ICD-10-CM | POA: Diagnosis not present

## 2015-04-28 DIAGNOSIS — M25512 Pain in left shoulder: Secondary | ICD-10-CM | POA: Diagnosis not present

## 2015-04-28 DIAGNOSIS — I10 Essential (primary) hypertension: Secondary | ICD-10-CM | POA: Diagnosis not present

## 2015-04-28 DIAGNOSIS — E119 Type 2 diabetes mellitus without complications: Secondary | ICD-10-CM | POA: Diagnosis not present

## 2015-04-28 DIAGNOSIS — G8929 Other chronic pain: Secondary | ICD-10-CM | POA: Insufficient documentation

## 2015-04-28 DIAGNOSIS — R55 Syncope and collapse: Secondary | ICD-10-CM

## 2015-04-28 DIAGNOSIS — Z79899 Other long term (current) drug therapy: Secondary | ICD-10-CM | POA: Diagnosis not present

## 2015-04-28 DIAGNOSIS — E559 Vitamin D deficiency, unspecified: Secondary | ICD-10-CM | POA: Diagnosis not present

## 2015-04-28 DIAGNOSIS — W1830XA Fall on same level, unspecified, initial encounter: Secondary | ICD-10-CM | POA: Diagnosis not present

## 2015-04-28 DIAGNOSIS — D72829 Elevated white blood cell count, unspecified: Secondary | ICD-10-CM | POA: Insufficient documentation

## 2015-04-28 DIAGNOSIS — M1990 Unspecified osteoarthritis, unspecified site: Secondary | ICD-10-CM | POA: Insufficient documentation

## 2015-04-28 DIAGNOSIS — Y92000 Kitchen of unspecified non-institutional (private) residence as  the place of occurrence of the external cause: Secondary | ICD-10-CM | POA: Diagnosis not present

## 2015-04-28 DIAGNOSIS — G894 Chronic pain syndrome: Secondary | ICD-10-CM | POA: Diagnosis present

## 2015-04-28 DIAGNOSIS — E785 Hyperlipidemia, unspecified: Secondary | ICD-10-CM | POA: Diagnosis not present

## 2015-04-28 DIAGNOSIS — S0101XA Laceration without foreign body of scalp, initial encounter: Secondary | ICD-10-CM | POA: Diagnosis present

## 2015-04-28 LAB — CBG MONITORING, ED: Glucose-Capillary: 166 mg/dL — ABNORMAL HIGH (ref 65–99)

## 2015-04-28 LAB — CBC WITH DIFFERENTIAL/PLATELET
BASOS PCT: 0 %
Basophils Absolute: 0 10*3/uL (ref 0.0–0.1)
EOS ABS: 0 10*3/uL (ref 0.0–0.7)
Eosinophils Relative: 0 %
HEMATOCRIT: 40.5 % (ref 36.0–46.0)
HEMOGLOBIN: 12.9 g/dL (ref 12.0–15.0)
LYMPHS ABS: 1.7 10*3/uL (ref 0.7–4.0)
Lymphocytes Relative: 14 %
MCH: 30.8 pg (ref 26.0–34.0)
MCHC: 31.9 g/dL (ref 30.0–36.0)
MCV: 96.7 fL (ref 78.0–100.0)
MONO ABS: 0.7 10*3/uL (ref 0.1–1.0)
MONOS PCT: 6 %
NEUTROS PCT: 80 %
Neutro Abs: 10 10*3/uL — ABNORMAL HIGH (ref 1.7–7.7)
Platelets: 255 10*3/uL (ref 150–400)
RBC: 4.19 MIL/uL (ref 3.87–5.11)
RDW: 13 % (ref 11.5–15.5)
WBC: 12.5 10*3/uL — ABNORMAL HIGH (ref 4.0–10.5)

## 2015-04-28 LAB — COMPREHENSIVE METABOLIC PANEL
ALK PHOS: 94 U/L (ref 38–126)
ALT: 17 U/L (ref 14–54)
ANION GAP: 8 (ref 5–15)
AST: 23 U/L (ref 15–41)
Albumin: 4.1 g/dL (ref 3.5–5.0)
BUN: 12 mg/dL (ref 6–20)
CALCIUM: 9.2 mg/dL (ref 8.9–10.3)
CO2: 23 mmol/L (ref 22–32)
CREATININE: 0.77 mg/dL (ref 0.44–1.00)
Chloride: 110 mmol/L (ref 101–111)
Glucose, Bld: 103 mg/dL — ABNORMAL HIGH (ref 65–99)
Potassium: 3.7 mmol/L (ref 3.5–5.1)
SODIUM: 141 mmol/L (ref 135–145)
TOTAL PROTEIN: 6.9 g/dL (ref 6.5–8.1)
Total Bilirubin: 0.4 mg/dL (ref 0.3–1.2)

## 2015-04-28 LAB — TROPONIN I: Troponin I: <0.03 ng/mL (ref <0.031)

## 2015-04-28 MED ORDER — LIDOCAINE-EPINEPHRINE (PF) 2 %-1:200000 IJ SOLN
INTRAMUSCULAR | Status: AC
  Administered 2015-04-28: 10 mL
  Filled 2015-04-28: qty 20

## 2015-04-28 MED ORDER — LIDOCAINE-EPINEPHRINE (PF) 2 %-1:200000 IJ SOLN
10.0000 mL | Freq: Once | INTRAMUSCULAR | Status: AC
  Administered 2015-04-28: 10 mL

## 2015-04-28 NOTE — ED Notes (Signed)
Pt stated in triage that she was feeling light headed, dizzy and nauseated

## 2015-04-28 NOTE — ED Provider Notes (Signed)
CSN: [id number]     Arrival date & time 04/28/15  1929 History   First MD Initiated Contact with Patient 04/28/15 2211     Chief Complaint  Patient presents with  . Fall  . Head Laceration    Patient is a 60 y.o. female presenting with fall and scalp laceration. The history is provided by the patient.  Fall This is a recurrent problem. Pertinent negatives include no chest pain, no abdominal pain, no headaches and no shortness of breath.  Head Laceration Pertinent negatives include no chest pain, no abdominal pain, no headaches and no shortness of breath.  Patient presents with syncope and laceration. States she felt er ears ringing and then she passed out. She hit her head and has a large laceration. States she was unconscious. She's had a couple episodes like this before. States she's had it for a little while but would sometimes happen when she would sit up States he been standing for a while this time. His been doing well last few days. No nausea vomiting. States that around a week ago she did have small left-sided chest pain. No fevers or chills. No headache, except for the laceration is. No neck pain. No numbness or weakness. Does have some chronic pain in her left shoulder. No swelling or legs. States she does have some pain in the back or quit on the left leg No dysuria..    Past Medical History  Diagnosis Date  . Hypertension   . Hyperlipidemia   . Depression   . GERD (gastroesophageal reflux disease)   . Arthritis   . COPD (chronic obstructive pulmonary disease) ([facility])   . Vitamin D deficiency   . AVN (avascular necrosis of bone) ([facility])     Left shoulder  . Allergy   . Diabetes mellitus without complication ([facility])     Pre-diabetes.    Past Surgical History  Procedure Laterality Date  . Cataract extraction Bilateral   . Spine surgery      lumbar  . Ankle surgery Right   . Tonsillectomy and adenoidectomy     Family History  Problem Relation Age of Onset  . Hypertension  Mother   . COPD Mother   . Stroke Mother   . Cancer Mother     breast  . Heart disease Father    Social History  Substance Use Topics  . Smoking status: Current Every Day Smoker -- 0.50 packs/day for 30 years  . Smokeless tobacco: Never Used  . Alcohol Use: No   OB History    No data available     Review of Systems  Constitutional: Negative for activity change and appetite change.  Eyes: Negative for pain.  Respiratory: Negative for chest tightness and shortness of breath.   Cardiovascular: Negative for chest pain and leg swelling.  Gastrointestinal: Negative for nausea, vomiting, abdominal pain and diarrhea.  Genitourinary: Negative for flank pain.  Musculoskeletal: Negative for back pain and neck stiffness.  Skin: Negative for rash.  Neurological: Positive for syncope. Negative for weakness, numbness and headaches.  Psychiatric/Behavioral: Negative for behavioral problems.      Allergies  Augmentin; Codeine; Lipitor; Wellbutrin; and Zoloft  Home Medications   Prior to Admission medications   Medication Sig Start Date End Date Taking? Authorizing Provider  albuterol (PROVENTIL HFA;VENTOLIN HFA) 108 (90 BASE) MCG/ACT inhaler Inhale 2 puffs into the lungs every 6 (six) hours as needed for wheezing or shortness of breath. 04/04/13  Yes Vicie Mutters, PA-C  cholecalciferol (VITAMIN D)  1000 UNITS tablet Take 5,000 Units by mouth daily.   Yes Historical Provider, MD  FLUoxetine (PROZAC) 40 MG capsule take 1 capsule by mouth twice a day Patient taking differently: Take 80 mg by mouth once daily 12/12/14  Yes Unk Pinto, MD  gabapentin (NEURONTIN) 300 MG capsule Take 300 mg by mouth 2 (two) times daily as needed (pain).  04/10/15  Yes Historical Provider, MD  oxyCODONE-acetaminophen (PERCOCET) 10-325 MG per tablet Take 1 tablet by mouth every 6 (six) hours as needed for pain.   Yes Historical Provider, MD  simvastatin (ZOCOR) 40 MG tablet take 1 tablet by mouth at  bedtime Patient taking differently: Take 20 mg by mouth at bedtime 09/24/14  Yes Unk Pinto, MD  tiZANidine (ZANAFLEX) 2 MG tablet Take 2 mg by mouth every 6 (six) hours as needed for muscle spasms.   Yes Historical Provider, MD  topiramate (TOPAMAX) 25 MG tablet Take 50 mg by mouth at bedtime.    Yes Historical Provider, MD  clobetasol (TEMOVATE) 0.05 % external solution apply 1 APPLICATION to affected area twice a day Patient not taking: Reported on 04/28/2015 03/10/14   Vicie Mutters, PA-C  varenicline (CHANTIX CONTINUING MONTH PAK) 1 MG tablet Take 1 tablet (1 mg total) by mouth 2 (two) times daily. Patient not taking: Reported on 04/28/2015 02/26/15   Vicie Mutters, PA-C   BP 133/74 mmHg  Pulse 89  Temp(Src) 97.7 F (36.5 C) (Oral)  Resp 10  Ht 5\' 4"  (1.626 m)  Wt 149 lb (67.586 kg)  BMI 25.56 kg/m2  SpO2 97% Physical Exam  Constitutional: She appears well-developed.  HENT:  7.5cm laceration across forehead with exposed bone underneath  Eyes: Pupils are equal, round, and reactive to light.  Neck: Neck supple.  Cardiovascular: Normal rate.   Pulmonary/Chest: Effort normal.  Abdominal: Soft. There is no tenderness.  Musculoskeletal: Normal range of motion. She exhibits no edema.  Neurological: She is alert.  Skin: Skin is warm.       ED Course  Procedures (including critical care time) Labs Review Labs Reviewed  CBC WITH DIFFERENTIAL/PLATELET - Abnormal; Notable for the following:    WBC 12.5 (*)    Neutro Abs 10.0 (*)    All other components within normal limits  COMPREHENSIVE METABOLIC PANEL - Abnormal; Notable for the following:    Glucose, Bld 103 (*)    All other components within normal limits  CBG MONITORING, ED - Abnormal; Notable for the following:    Glucose-Capillary 166 (*)    All other components within normal limits  TROPONIN I  URINALYSIS, ROUTINE W REFLEX MICROSCOPIC (NOT AT [facility])    Imaging Review Dg Chest 2 View  04/28/2015  CLINICAL DATA:   Syncope and fall.  Chronically short of breath. EXAM: CHEST  2 VIEW COMPARISON:  Chest x-ray dated 06/05/2014. FINDINGS: Heart size is normal. Overall cardiomediastinal silhouette is stable in size and configuration. Lungs are at least mildly hyperexpanded suggesting COPD. Lungs are clear. No pleural effusion. No pneumothorax. Mild degenerative change again noted within the thoracic spine. No acute- appearing osseous abnormality. IMPRESSION: 1. Lungs are clear and there is no evidence of acute cardiopulmonary abnormality. 2. Lungs at least mildly hyperexpanded suggesting COPD. Electronically Signed   By: Franki Cabot M.D.   On: 04/28/2015 22:56   Ct Head Wo Contrast  04/28/2015  CLINICAL DATA:  Status post fall. Dizziness. Large laceration at the mid forehead. Initial encounter. EXAM: CT HEAD WITHOUT CONTRAST TECHNIQUE: Contiguous axial images were  obtained from the base of the skull through the vertex without intravenous contrast. COMPARISON:  CT of the head performed 05/04/2006 FINDINGS: There is no evidence of acute infarction, mass lesion, or intra- or extra-axial hemorrhage on CT. Mild periventricular white matter change likely reflects small vessel ischemic microangiopathy. The posterior fossa, including the cerebellum, brainstem and fourth ventricle, is within normal limits. The third and lateral ventricles, and basal ganglia are unremarkable in appearance. The cerebral hemispheres are symmetric in appearance, with normal gray-white differentiation. No mass effect or midline shift is seen. There is no evidence of fracture; visualized osseous structures are unremarkable in appearance. The visualized portions of the orbits are within normal limits. The paranasal sinuses and mastoid air cells are well-aerated. A large soft tissue laceration is noted overlying the frontal calvarium, extending to the calvarium. IMPRESSION: 1. No evidence of traumatic intracranial injury or fracture. 2. Large soft tissue  laceration overlying the frontal calvarium, extending to the calvarium. 3. Mild small vessel ischemic microangiopathy. Electronically Signed   By: Garald Balding M.D.   On: 04/28/2015 22:57   I have personally reviewed and evaluated these images and lab results as part of my medical decision-making.   EKG Interpretation   Date/Time:  Monday April 28 2015 19:51:14 EDT Ventricular Rate:  77 PR Interval:  156 QRS Duration: 92 QT Interval:  434 QTC Calculation: 491 R Axis:   88 Text Interpretation:  Sinus rhythm Borderline right axis deviation  Probable lateral infarct, old inferior q  waves Confirmed by Alvino Chapel   MD, Ovid Curd [phone]) on 04/28/2015 11:16:31 PM      MDM   Final diagnoses:  Syncope, unspecified syncope type  Forehead laceration, initial encounter    Patient presents with syncope. Has had previous episodes of lightheadedness but only to actual syncopal episodes. Today follows bad enough that she had a forehead laceration. It was closed in the ER.Will admit to internal medicine.    Davonna Belling, MD 04/29/15 [phone]

## 2015-04-28 NOTE — ED Notes (Signed)
Margarita Mail, PA at bedside suturing patients laceration.

## 2015-04-28 NOTE — ED Provider Notes (Signed)
..  Laceration Repair Date/Time: 04/28/2015 11:40 PM Performed by: Margarita Mail Authorized by: Margarita Mail Consent: Verbal consent obtained. Risks and benefits: risks, benefits and alternatives were discussed Consent given by: patient Patient identity confirmed: verbally with patient and provided demographic data Time out: Immediately prior to procedure a "time out" was called to verify the correct patient, procedure, equipment, support staff and site/side marked as required. Body area: head/neck Location details: forehead Laceration length: 7.5 cm Vascular damage: no Anesthesia: local infiltration Local anesthetic: lidocaine 2% with epinephrine Anesthetic total: 9 ml Patient sedated: no Preparation: Patient was prepped and draped in the usual sterile fashion. Irrigation method: jet lavage Amount of cleaning: extensive Degree of undermining: none Skin closure: Ethilon Subcutaneous closure: 4-0 Vicryl Fascia closure: 4-0 Vicryl Number of sutures: 14 Technique: simple and running (sub q) Approximation: close Approximation difficulty: complex Dressing: pressure dressing Patient tolerance: Patient tolerated the procedure well with no immediate complications     Margarita Mail, PA-C 04/29/15 0028  Davonna Belling, MD 04/29/15 [phone]

## 2015-04-28 NOTE — ED Notes (Signed)
PA at bedside for suturing 

## 2015-04-28 NOTE — ED Notes (Signed)
Pt reports she walked into the kitchen to get a drink and had a fall. Pt denies that she tripped or slipped but reports she has had recent dizzy episodes. Patient is unsure if she lost consciousness but does not remember all the events. Pt reports she either hit her head on the stove or the counter. Pt has a large, deep, laceration to mid forehead.

## 2015-04-28 NOTE — ED Notes (Signed)
Patient transported to X-ray 

## 2015-04-29 ENCOUNTER — Observation Stay (HOSPITAL_COMMUNITY): Payer: Medicare Other

## 2015-04-29 ENCOUNTER — Other Ambulatory Visit: Payer: Self-pay | Admitting: Cardiology

## 2015-04-29 ENCOUNTER — Encounter (HOSPITAL_COMMUNITY): Payer: Self-pay | Admitting: Family Medicine

## 2015-04-29 DIAGNOSIS — G894 Chronic pain syndrome: Secondary | ICD-10-CM | POA: Diagnosis not present

## 2015-04-29 DIAGNOSIS — R55 Syncope and collapse: Secondary | ICD-10-CM

## 2015-04-29 DIAGNOSIS — J42 Unspecified chronic bronchitis: Secondary | ICD-10-CM

## 2015-04-29 DIAGNOSIS — I10 Essential (primary) hypertension: Secondary | ICD-10-CM

## 2015-04-29 DIAGNOSIS — S0181XA Laceration without foreign body of other part of head, initial encounter: Secondary | ICD-10-CM

## 2015-04-29 DIAGNOSIS — R9431 Abnormal electrocardiogram [ECG] [EKG]: Secondary | ICD-10-CM

## 2015-04-29 LAB — URINALYSIS, ROUTINE W REFLEX MICROSCOPIC
BILIRUBIN URINE: NEGATIVE
Glucose, UA: NEGATIVE mg/dL
Ketones, ur: NEGATIVE mg/dL
Leukocytes, UA: NEGATIVE
Nitrite: NEGATIVE
Protein, ur: NEGATIVE mg/dL
SPECIFIC GRAVITY, URINE: 1.026 (ref 1.005–1.030)
pH: 5.5 (ref 5.0–8.0)

## 2015-04-29 LAB — BASIC METABOLIC PANEL
Anion gap: 9 (ref 5–15)
BUN: 10 mg/dL (ref 6–20)
CALCIUM: 9.2 mg/dL (ref 8.9–10.3)
CO2: 24 mmol/L (ref 22–32)
CREATININE: 0.86 mg/dL (ref 0.44–1.00)
Chloride: 108 mmol/L (ref 101–111)
GFR calc non Af Amer: >60 mL/min (ref >60)
Glucose, Bld: 97 mg/dL (ref 65–99)
Potassium: 3.9 mmol/L (ref 3.5–5.1)
SODIUM: 141 mmol/L (ref 135–145)

## 2015-04-29 LAB — GLUCOSE, CAPILLARY: Glucose-Capillary: 144 mg/dL — ABNORMAL HIGH (ref 65–99)

## 2015-04-29 LAB — URINE MICROSCOPIC-ADD ON

## 2015-04-29 LAB — CBC
HCT: 39 % (ref 36.0–46.0)
Hemoglobin: 12.5 g/dL (ref 12.0–15.0)
MCH: 31 pg (ref 26.0–34.0)
MCHC: 32.1 g/dL (ref 30.0–36.0)
MCV: 96.8 fL (ref 78.0–100.0)
PLATELETS: 240 10*3/uL (ref 150–400)
RBC: 4.03 MIL/uL (ref 3.87–5.11)
RDW: 12.9 % (ref 11.5–15.5)
WBC: 10.1 10*3/uL (ref 4.0–10.5)

## 2015-04-29 LAB — ECHOCARDIOGRAM COMPLETE
HEIGHTINCHES: 64 in
WEIGHTICAEL: 2310.4 [oz_av]

## 2015-04-29 LAB — TROPONIN I

## 2015-04-29 LAB — CK: Total CK: 89 U/L (ref 38–234)

## 2015-04-29 MED ORDER — OXYCODONE HCL 5 MG PO TABS
15.0000 mg | ORAL_TABLET | Freq: Four times a day (QID) | ORAL | Status: DC | PRN
  Administered 2015-04-29: 15 mg via ORAL
  Filled 2015-04-29: qty 3

## 2015-04-29 MED ORDER — TIZANIDINE HCL 2 MG PO TABS
2.0000 mg | ORAL_TABLET | Freq: Four times a day (QID) | ORAL | Status: DC | PRN
  Filled 2015-04-29: qty 1

## 2015-04-29 MED ORDER — ZOLPIDEM TARTRATE 5 MG PO TABS
5.0000 mg | ORAL_TABLET | Freq: Once | ORAL | Status: AC
  Administered 2015-04-29: 5 mg via ORAL
  Filled 2015-04-29: qty 1

## 2015-04-29 MED ORDER — TOPIRAMATE 25 MG PO TABS
50.0000 mg | ORAL_TABLET | Freq: Every day | ORAL | Status: DC
  Administered 2015-04-29 – 2015-04-30 (×3): 50 mg via ORAL
  Filled 2015-04-29 (×4): qty 2

## 2015-04-29 MED ORDER — OXYCODONE HCL 5 MG PO TABS: 10.0000 mg | ORAL_TABLET | Freq: Four times a day (QID) | ORAL | Status: DC | PRN

## 2015-04-29 MED ORDER — ACETAMINOPHEN 325 MG PO TABS
650.0000 mg | ORAL_TABLET | Freq: Once | ORAL | Status: AC
  Administered 2015-04-29: 650 mg via ORAL
  Filled 2015-04-29: qty 2

## 2015-04-29 MED ORDER — ACETAMINOPHEN 325 MG PO TABS
325.0000 mg | ORAL_TABLET | Freq: Four times a day (QID) | ORAL | Status: DC | PRN
  Administered 2015-04-29 – 2015-05-01 (×5): 325 mg via ORAL
  Filled 2015-04-29 (×5): qty 1

## 2015-04-29 MED ORDER — OXYCODONE-ACETAMINOPHEN 5-325 MG PO TABS
1.0000 | ORAL_TABLET | Freq: Four times a day (QID) | ORAL | Status: DC | PRN
  Administered 2015-04-29: 1 via ORAL
  Filled 2015-04-29 (×2): qty 1

## 2015-04-29 MED ORDER — OXYCODONE-ACETAMINOPHEN 5-325 MG PO TABS: 2.0000 | ORAL_TABLET | Freq: Four times a day (QID) | ORAL | Status: DC | PRN

## 2015-04-29 MED ORDER — OXYCODONE HCL 5 MG PO TABS
10.0000 mg | ORAL_TABLET | Freq: Four times a day (QID) | ORAL | Status: DC | PRN
  Administered 2015-04-29 – 2015-04-30 (×5): 10 mg via ORAL
  Filled 2015-04-29 (×5): qty 2

## 2015-04-29 MED ORDER — SODIUM CHLORIDE 0.9% FLUSH
3.0000 mL | Freq: Two times a day (BID) | INTRAVENOUS | Status: DC
  Administered 2015-04-29 – 2015-05-01 (×4): 3 mL via INTRAVENOUS

## 2015-04-29 MED ORDER — OXYCODONE HCL 5 MG PO TABS
5.0000 mg | ORAL_TABLET | Freq: Four times a day (QID) | ORAL | Status: DC | PRN
  Administered 2015-04-29: 5 mg via ORAL
  Filled 2015-04-29: qty 1

## 2015-04-29 MED ORDER — SIMVASTATIN 40 MG PO TABS
40.0000 mg | ORAL_TABLET | Freq: Every day | ORAL | Status: DC
  Administered 2015-04-29 – 2015-04-30 (×3): 40 mg via ORAL
  Filled 2015-04-29 (×3): qty 1

## 2015-04-29 MED ORDER — FLUOXETINE HCL 20 MG PO CAPS
40.0000 mg | ORAL_CAPSULE | Freq: Two times a day (BID) | ORAL | Status: DC
  Administered 2015-04-29 – 2015-05-01 (×4): 40 mg via ORAL
  Filled 2015-04-29 (×4): qty 2

## 2015-04-29 MED ORDER — OXYCODONE-ACETAMINOPHEN 10-325 MG PO TABS: 1.0000 | ORAL_TABLET | Freq: Four times a day (QID) | ORAL | Status: DC | PRN

## 2015-04-29 MED ORDER — GABAPENTIN 300 MG PO CAPS: 300.0000 mg | ORAL_CAPSULE | Freq: Two times a day (BID) | ORAL | Status: DC | PRN

## 2015-04-29 MED ORDER — OXYCODONE-ACETAMINOPHEN 5-325 MG PO TABS
1.0000 | ORAL_TABLET | Freq: Four times a day (QID) | ORAL | Status: DC | PRN
  Administered 2015-04-29: 1 via ORAL
  Filled 2015-04-29: qty 1

## 2015-04-29 MED ORDER — ENOXAPARIN SODIUM 40 MG/0.4ML ~~LOC~~ SOLN
40.0000 mg | SUBCUTANEOUS | Status: DC
  Administered 2015-04-29 – 2015-04-30 (×2): 40 mg via SUBCUTANEOUS
  Filled 2015-04-29 (×2): qty 0.4

## 2015-04-29 NOTE — Progress Notes (Addendum)
[facility] Progress Note   Amber Hickman  [id number]  DOB: 04-01-1955  DOA: 04/28/2015 PCP: Alesia Richards, MD  Brief narrative: History of present illness:  60-year-old female with a history of hypertension and chronic pain on opioids who presented to the hospital for syncope. She states that she took her usual dose of Percocet and tizanidine around 3:00. The syncopal episodes of occurred soon after 6:00. She was walking around in the house when she suddenly fell forward. When she woke up she had a large laceration on her forehead and was bleeding. She states that she did not feel lightheaded prior to the episode. No complaint of palpitations chest pain or focal neurological defects. She had not lost control of her bowel or bladder. She admitted to having intense chest pain radiating to her back and left shoulder about 3 wks ago after a funeral lasting about 45 min resolving on its own.   Subjective: No dyspnea, cough, chest pain, palpitation, nausea, vomiting, diarrhea.   Assessment/Plan:  Hospital Course:  Syncope - could have been related to narcotics/ muscle relaxants but she states she did not take more than her usual and episode occurred about 3 hrs afterwards - orthostatic vitals positive but after ambulating with her in the hall, I note no symptoms of orthostatics - CT head negative - cardiac enzymes negative - ECHO shows no wall motion abnormalities- unrevealing as to cause  - EKG shows T wave inversion in aVL and V 2 adn Q waves in inf leads (NEW)- chest pain 3 wks ago could have been stress but may have been an MI - spoke with Dr Gwenlyn Found- recommended myoview stress test tomorrow- have discussed with patient  - will order an event monitor as well  Laceration - sutured with Vicryl- If not dissolved in 5 days, need to be removed  Chronic pain - cont home meds to see her response to her usual doses  Smoker  - advised to  stop  Antibiotics: Anti-infectives    None     Code Status:     Code Status Orders        Start     Ordered   04/29/15 0148  Full code   Continuous     04/29/15 0147    Code Status History    Date Active Date Inactive Code Status Order ID Comments User Context   This patient has a current code status but no historical code status.    Advance Directive Documentation        Most Recent Value   Type of Advance Directive  Living will, Healthcare Power of Attorney   Pre-existing out of facility DNR order (yellow form or pink MOST form)     "MOST" Form in Place?       Family Communication:  Disposition Plan: stress test tomorrow- if negative, hopefully home tomorrow DVT prophylaxis: Lovenox Consultants: have request cards consult Procedures:  ECHO- Study Conclusions  - Left ventricle: The cavity size was normal. There was mild  concentric hypertrophy. Systolic function was normal. The  estimated ejection fraction was in the range of 55% to 60%. Wall  motion was normal; there were no regional wall motion  abnormalities. Doppler parameters are consistent with abnormal  left ventricular relaxation (grade 1 diastolic dysfunction).   Objective: Filed Weights   04/28/15 1952 04/29/15 0126 04/29/15 0605  Weight: 67.586 kg (149 lb) 66.996 kg (147 lb 11.2 oz) 65.499 kg (144 lb 6.4 oz)    Intake/Output Summary (Last  24 hours) at 04/29/15 1653 Last data filed at 04/29/15 0750  Gross per 24 hour  Intake      0 ml  Output   1175 ml  Net  -1175 ml     Vitals Filed Vitals:   04/29/15 0030 04/29/15 0126 04/29/15 0605 04/29/15 1415  BP: 127/73 161/71 146/57 128/66  Pulse: 78 69 61 73  Temp:  98.5 F (36.9 C) 98.3 F (36.8 C) 98.3 F (36.8 C)  TempSrc:  Oral Oral Oral  Resp: 8 18 18 20   Height:  5\' 4"  (1.626 m)    Weight:  66.996 kg (147 lb 11.2 oz) 65.499 kg (144 lb 6.4 oz)   SpO2: 95% 98% 98% 98%    Exam:  General:  Pt is alert, not in acute  distress  HEENT: No icterus, No thrush, oral mucosa moist- laceration on forehead is clean  Cardiovascular: regular rate and rhythm, S1/S2 No murmur  Respiratory: clear to auscultation bilaterally   Abdomen: Soft, +Bowel sounds, non tender, non distended, no guarding  MSK: No cyanosis or clubbing- no pedal edema   Data Reviewed: Basic Metabolic Panel:  Recent Labs Lab 04/28/15 2259 04/29/15 0450  NA 141 141  K 3.7 3.9  CL 110 108  CO2 23 24  GLUCOSE 103* 97  BUN 12 10  CREATININE 0.77 0.86  CALCIUM 9.2 9.2   Liver Function Tests:  Recent Labs Lab 04/28/15 2259  AST 23  ALT 17  ALKPHOS 94  BILITOT 0.4  PROT 6.9  ALBUMIN 4.1   No results for input(s): LIPASE, AMYLASE in the last 168 hours. No results for input(s): AMMONIA in the last 168 hours. CBC:  Recent Labs Lab 04/28/15 2259 04/29/15 0450  WBC 12.5* 10.1  NEUTROABS 10.0*  --   HGB 12.9 12.5  HCT 40.5 39.0  MCV 96.7 96.8  PLT 255 240   Cardiac Enzymes:  Recent Labs Lab 04/28/15 2259 04/29/15 1019  CKTOTAL  --  89  TROPONINI <0.03 <0.03   BNP (last 3 results) No results for input(s): BNP in the last 8760 hours.  ProBNP (last 3 results) No results for input(s): PROBNP in the last 8760 hours.  CBG:  Recent Labs Lab 04/28/15 1953 04/29/15 0804  GLUCAP 166* 144*    No results found for this or any previous visit (from the past 240 hour(s)).   Studies: Dg Chest 2 View  04/28/2015  CLINICAL DATA:  Syncope and fall.  Chronically short of breath. EXAM: CHEST  2 VIEW COMPARISON:  Chest x-ray dated 06/05/2014. FINDINGS: Heart size is normal. Overall cardiomediastinal silhouette is stable in size and configuration. Lungs are at least mildly hyperexpanded suggesting COPD. Lungs are clear. No pleural effusion. No pneumothorax. Mild degenerative change again noted within the thoracic spine. No acute- appearing osseous abnormality. IMPRESSION: 1. Lungs are clear and there is no evidence of acute  cardiopulmonary abnormality. 2. Lungs at least mildly hyperexpanded suggesting COPD. Electronically Signed   By: Franki Cabot M.D.   On: 04/28/2015 22:56   Ct Head Wo Contrast  04/28/2015  CLINICAL DATA:  Status post fall. Dizziness. Large laceration at the mid forehead. Initial encounter. EXAM: CT HEAD WITHOUT CONTRAST TECHNIQUE: Contiguous axial images were obtained from the base of the skull through the vertex without intravenous contrast. COMPARISON:  CT of the head performed 05/04/2006 FINDINGS: There is no evidence of acute infarction, mass lesion, or intra- or extra-axial hemorrhage on CT. Mild periventricular white matter change likely reflects small vessel  ischemic microangiopathy. The posterior fossa, including the cerebellum, brainstem and fourth ventricle, is within normal limits. The third and lateral ventricles, and basal ganglia are unremarkable in appearance. The cerebral hemispheres are symmetric in appearance, with normal gray-white differentiation. No mass effect or midline shift is seen. There is no evidence of fracture; visualized osseous structures are unremarkable in appearance. The visualized portions of the orbits are within normal limits. The paranasal sinuses and mastoid air cells are well-aerated. A large soft tissue laceration is noted overlying the frontal calvarium, extending to the calvarium. IMPRESSION: 1. No evidence of traumatic intracranial injury or fracture. 2. Large soft tissue laceration overlying the frontal calvarium, extending to the calvarium. 3. Mild small vessel ischemic microangiopathy. Electronically Signed   By: Garald Balding M.D.   On: 04/28/2015 22:57    Scheduled Meds:  Scheduled Meds: . FLUoxetine  40 mg Oral BID  . simvastatin  40 mg Oral QHS  . sodium chloride flush  3 mL Intravenous Q12H  . topiramate  50 mg Oral QHS   Continuous Infusions:   Time spent on care of this patient: 65 min   [place], MD 04/29/2015, 4:53 PM    [facility] Office  [phone] Pager - Text Page per [email or web address] If 7PM-7AM, please contact night-coverage [email or web address]

## 2015-04-29 NOTE — H&P (Signed)
History and Physical  Patient Name: Amber Hickman     [id number]    DOB: 1956/01/27    DOA: 04/28/2015 Referring physician: Davonna Belling, MD PCP: Alesia Richards, MD      Chief Complaint: Syncope  HPI: Amber Hickman is a 60 y.o. female with a past medical history significant for HTN and chronic pain on opioid therapy who presents with syncope and head laceration.  The patient was feeling her normal self until this evening after a small supper, she stood up from the couch and walk to the kitchen and passed out. She remembers no prodrome, no dizziness, no room spinning, no vertigo, no ringing in the ears, no palpitations, no chest pressure.  Afterwards, she felt shaky but not sluggish or groggy, she had not soiled herself, and she found that she was bleeding heavily from her head, so she came to the ER.  She notes that she had another episode like this about 1 month ago, with minimal prodrome, in which she passed out while standing up, but mostly caught herself, because she was aroused by bottle hitting her leg. She thinks both of these episodes may have happened after taking her oxycodone. She normally takes about 5 tablets of oxycodone per day. She takes 10-20 mg at bedtime. She has started no other new medicines recently. There is no family history of sudden cardiac death, unexplained death, death while swimming, HOCM, arrhythmia, or channelopathy that she knows of. Father had premature cardiovascular disease, but no other heart disease in the family.  In the ED, the patient was hemodynamically stable and had a large head laceration. This was sutured, and TRH were asked to evaluate for syncope observation.     Review of Systems:  All other systems negative except as just noted or noted in the history of present illness.  Allergies  Allergen Reactions  . Augmentin [Amoxicillin-Pot Clavulanate]     Has patient had a PCN reaction causing immediate rash,  facial/tongue/throat swelling, SOB or lightheadedness with hypotension: No Has patient had a PCN reaction causing severe rash involving mucus membranes or skin necrosis: No Has patient had a PCN reaction that required hospitalization No Has patient had a PCN reaction occurring within the last 10 years: No If all of the above answers are "NO", then may proceed with Cephalosporin use.   . Codeine Nausea And Vomiting  . Lipitor [Atorvastatin]     Leg pain  . Wellbutrin [Bupropion]     Pt does not recall reaction   . Zoloft [Sertraline Hcl]     Pt does not recall reaction     Prior to Admission medications   Medication Sig Start Date End Date Taking? Authorizing Provider  albuterol (PROVENTIL HFA;VENTOLIN HFA) 108 (90 BASE) MCG/ACT inhaler Inhale 2 puffs into the lungs every 6 (six) hours as needed for wheezing or shortness of breath. 04/04/13  Yes Vicie Mutters, PA-C  cholecalciferol (VITAMIN D) 1000 UNITS tablet Take 5,000 Units by mouth daily.   Yes Historical Provider, MD  FLUoxetine (PROZAC) 40 MG capsule take 1 capsule by mouth twice a day Patient taking differently: Take 80 mg by mouth once daily 12/12/14  Yes Unk Pinto, MD  gabapentin (NEURONTIN) 300 MG capsule Take 300 mg by mouth 2 (two) times daily as needed (pain).  04/10/15  Yes Historical Provider, MD  oxyCODONE-acetaminophen (PERCOCET) 10-325 MG per tablet Take 1 tablet by mouth every 6 (six) hours as needed for pain.   Yes Historical Provider, MD  simvastatin (  ZOCOR) 40 MG tablet take 1 tablet by mouth at bedtime Patient taking differently: Take 20 mg by mouth at bedtime 09/24/14  Yes Unk Pinto, MD  tiZANidine (ZANAFLEX) 2 MG tablet Take 2 mg by mouth every 6 (six) hours as needed for muscle spasms.   Yes Historical Provider, MD  topiramate (TOPAMAX) 25 MG tablet Take 50 mg by mouth at bedtime.    Yes Historical Provider, MD  clobetasol (TEMOVATE) 0.05 % external solution apply 1 APPLICATION to affected area twice a  day Patient not taking: Reported on 04/28/2015 03/10/14   Vicie Mutters, PA-C  varenicline (CHANTIX CONTINUING MONTH PAK) 1 MG tablet Take 1 tablet (1 mg total) by mouth 2 (two) times daily. Patient not taking: Reported on 04/28/2015 02/26/15   Vicie Mutters, PA-C    Past Medical History  Diagnosis Date  . Hypertension   . Hyperlipidemia   . Depression   . GERD (gastroesophageal reflux disease)   . Arthritis   . COPD (chronic obstructive pulmonary disease) ([place])   . Vitamin D deficiency   . AVN (avascular necrosis of bone) (H[facility])     Left shoulder  . Allergy   . Diabetes mellitus without complication ([facility])     Pre-diabetes.     Past Surgical History  Procedure Laterality Date  . Cataract extraction Bilateral   . Spine surgery      lumbar  . Ankle surgery Right   . Tonsillectomy and adenoidectomy      Family history: family history includes COPD in her mother; Cancer in her mother; Heart disease in her father; Hypertension in her mother; Stroke in her mother.  Social History: Patient lives alone with her pets. She smokes. She uses prescribed opioids. She is disabled.       Physical Exam: BP 127/73 mmHg  Pulse 78  Temp(Src) 97.7 F (36.5 C) (Oral)  Resp 8  Ht 5\' 4"  (1.626 m)  Wt 67.586 kg (149 lb)  BMI 25.56 kg/m2  SpO2 95% General appearance: Well-developed, obese adult female, alert and in no acute distress.   Eyes: Anicteric, conjunctiva pink, lids and lashes normal.     ENT: No nasal deformity, discharge, or epistaxis.  OP moist without lesions.   Skin: Warm and dry.  No jaundice.  No suspicious rashes or lesions.  Laceration bandaged on head.  See EDP note for image of initial wound. Cardiac: RRR, nl S1-S2, no murmurs appreciated.  Capillary refill is brisk.  JVP normal.  No LE edema.  Radial pulses 2+ and symmetric. Respiratory: Normal respiratory rate and rhythm.  CTAB without rales or wheezes. Abdomen: Abdomen soft without rigidity.  No TTP. No ascites,  distension.   MSK: No deformities or effusions. Neuro: Sensorium intact and responding to questions, attention normal.  Speech is fluent.  Moves all extremities equally and with normal coordination.    Psych: Behavior appropriate.  Affect normal.  No evidence of aural or visual hallucinations or delusions.       Labs on Admission:  The metabolic panel shows normal electro lites and renal function. The transaminases and bilirubin are normal The complete blood count shows mild leukocytosis, no anemia or thrombocytopenia. Troponin is normal.   Radiological Exams on Admission: Personally reviewed: Dg Chest 2 View  04/28/2015  CLINICAL DATA:  Syncope and fall.  Chronically short of breath. EXAM: CHEST  2 VIEW COMPARISON:  Chest x-ray dated 06/05/2014. FINDINGS: Heart size is normal. Overall cardiomediastinal silhouette is stable in size and configuration. Lungs are at  least mildly hyperexpanded suggesting COPD. Lungs are clear. No pleural effusion. No pneumothorax. Mild degenerative change again noted within the thoracic spine. No acute- appearing osseous abnormality. IMPRESSION: 1. Lungs are clear and there is no evidence of acute cardiopulmonary abnormality. 2. Lungs at least mildly hyperexpanded suggesting COPD. Electronically Signed   By: Franki Cabot M.D.   On: 04/28/2015 22:56   Ct Head Wo Contrast  04/28/2015  CLINICAL DATA:  Status post fall. Dizziness. Large laceration at the mid forehead. Initial encounter. EXAM: CT HEAD WITHOUT CONTRAST TECHNIQUE: Contiguous axial images were obtained from the base of the skull through the vertex without intravenous contrast. COMPARISON:  CT of the head performed 05/04/2006 FINDINGS: There is no evidence of acute infarction, mass lesion, or intra- or extra-axial hemorrhage on CT. Mild periventricular white matter change likely reflects small vessel ischemic microangiopathy. The posterior fossa, including the cerebellum, brainstem and fourth ventricle, is  within normal limits. The third and lateral ventricles, and basal ganglia are unremarkable in appearance. The cerebral hemispheres are symmetric in appearance, with normal gray-white differentiation. No mass effect or midline shift is seen. There is no evidence of fracture; visualized osseous structures are unremarkable in appearance. The visualized portions of the orbits are within normal limits. The paranasal sinuses and mastoid air cells are well-aerated. A large soft tissue laceration is noted overlying the frontal calvarium, extending to the calvarium. IMPRESSION: 1. No evidence of traumatic intracranial injury or fracture. 2. Large soft tissue laceration overlying the frontal calvarium, extending to the calvarium. 3. Mild small vessel ischemic microangiopathy. Electronically Signed   By: Garald Balding M.D.   On: 04/28/2015 22:57    EKG: Independently reviewed. Sinus rhythm, rate 77, QTC somewhat elevated. Nonpathological Q waves in inferior leads.    Assessment/Plan 1. Syncope:  This is new.  Unwitnessed.  No preceding symptoms, presyncope, palpitations, no evidence to suggest seizure.  Family history of CAD, but no family history of arrhythmia, SCD, or unexplained death.  Use seems to correlate with nodding off after taking oxycodone.  No new medicines. -Telemetry overnight -Echocardiogram tomorrow -Check orthostatics -Check UA  2. Chronic pain:  -Continue home gabapentin, topiramate, Zanaflex, and oxycodone  3. Depression:  -Continue home fluoxetine  4. Leukocytosis:  Unclear etiology, likely reactive from trauma. -Repeat CBC tomorrow   DVT PPx: Low risk, outpatient status Diet: Regular Consultants: None Code Status: FULL Family Communication: None present  Medical decision making: What exists of the patient's previous chart was reviewed in depth and the case was discussed with Dr. Alvino Chapel. Patient seen 1:13 AM on 04/29/2015.  Disposition Plan:  I recommend admission to  telemetry observation status.  Clinical condition: stable.  Anticipate observation overnight.  If orthostatics, telemetry reading, echocardiogram are normal, recommend discharge with close follow up with PCP, counseling regarding oxycodone use and tapering.      Edwin Dada [facility] Pager [phone]

## 2015-04-29 NOTE — Care Management Obs Status (Signed)
[facility] NOTIFICATION   Patient Details  Name: MAURIANA FOGLEMAN MRN: [id number] Date of Birth: [birth year]   Medicare Observation Status Notification Given:  Yes    Purcell Mouton, RN 04/29/2015, 2:19 PM

## 2015-04-29 NOTE — ED Notes (Signed)
Hospitalist at bedside 

## 2015-04-30 ENCOUNTER — Ambulatory Visit (HOSPITAL_COMMUNITY)
Admit: 2015-04-30 | Discharge: 2015-04-30 | Disposition: A | Discharge status: Home / Self Care | Payer: Medicare Other | Source: Home / Self Care | Attending: Cardiovascular Disease | Admitting: Cardiovascular Disease

## 2015-04-30 ENCOUNTER — Observation Stay (HOSPITAL_COMMUNITY)
Admit: 2015-04-30 | Discharge: 2015-04-30 | Disposition: A | Discharge status: Home / Self Care | Payer: Medicare Other | Attending: Cardiovascular Disease | Admitting: Cardiovascular Disease

## 2015-04-30 DIAGNOSIS — F17200 Nicotine dependence, unspecified, uncomplicated: Secondary | ICD-10-CM | POA: Diagnosis not present

## 2015-04-30 DIAGNOSIS — R55 Syncope and collapse: Secondary | ICD-10-CM | POA: Diagnosis not present

## 2015-04-30 DIAGNOSIS — G894 Chronic pain syndrome: Secondary | ICD-10-CM

## 2015-04-30 DIAGNOSIS — S0181XS Laceration without foreign body of other part of head, sequela: Secondary | ICD-10-CM | POA: Diagnosis not present

## 2015-04-30 DIAGNOSIS — E119 Type 2 diabetes mellitus without complications: Secondary | ICD-10-CM | POA: Insufficient documentation

## 2015-04-30 DIAGNOSIS — R079 Chest pain, unspecified: Secondary | ICD-10-CM | POA: Insufficient documentation

## 2015-04-30 DIAGNOSIS — I10 Essential (primary) hypertension: Secondary | ICD-10-CM

## 2015-04-30 LAB — NM MYOCAR MULTI W/SPECT W/WALL MOTION / EF
CHL CUP MPHR: 160 {beats}/min
CSEPED: 0 min
Estimated workload: 1 METS
Exercise duration (sec): 0 s
Peak HR: 95 {beats}/min
Percent HR: 59 %
Rest HR: 65 {beats}/min

## 2015-04-30 LAB — GLUCOSE, CAPILLARY: GLUCOSE-CAPILLARY: 98 mg/dL (ref 65–99)

## 2015-04-30 MED ORDER — REGADENOSON 0.4 MG/5ML IV SOLN
0.4000 mg | Freq: Once | INTRAVENOUS | Status: AC
  Administered 2015-04-30: 0.4 mg via INTRAVENOUS
  Filled 2015-04-30: qty 5

## 2015-04-30 MED ORDER — REGADENOSON 0.4 MG/5ML IV SOLN
INTRAVENOUS | Status: AC
  Filled 2015-04-30: qty 5

## 2015-04-30 MED ORDER — TECHNETIUM TC 99M SESTAMIBI GENERIC - CARDIOLITE
10.0000 | Freq: Once | INTRAVENOUS | Status: AC | PRN
  Administered 2015-04-30: 10 via INTRAVENOUS

## 2015-04-30 MED ORDER — TECHNETIUM TC 99M SESTAMIBI GENERIC - CARDIOLITE
30.0000 | Freq: Once | INTRAVENOUS | Status: AC | PRN
  Administered 2015-04-30: 30 via INTRAVENOUS

## 2015-04-30 NOTE — Progress Notes (Signed)
   Amber Hickman presented for a lexiscan cardiolite today.  No immediate complications.  Stress imaging pending.  Murray Hodgkins, NP 04/30/2015, 11:37 AM

## 2015-04-30 NOTE — Progress Notes (Signed)
[facility] Progress Note   Amber Hickman  [id number]  DOB: 09-May-1955  DOA: 04/28/2015 PCP: Alesia Richards, MD  Brief narrative: History of present illness:  60-year-old female with a history of hypertension and chronic pain on opioids who presented to the hospital for syncope. She states that she took her usual dose of Percocet and tizanidine around 3:00. The syncopal episodes of occurred soon after 6:00. She was walking around in the house when she suddenly fell forward. When she woke up she had a large laceration on her forehead and was bleeding. She states that she did not feel lightheaded prior to the episode. No complaint of palpitations chest pain or focal neurological defects. She had not lost control of her bowel or bladder. She admitted to having intense chest pain radiating to her back and left shoulder about 3 wks ago after a funeral lasting about 45 min resolving on its own.   Subjective: No dyspnea, cough, chest pain, palpitation, nausea, vomiting, diarrhea.   Assessment/Plan:  Syncope - could have been related to narcotics/ muscle relaxants but she states she did not take more than her usual and episode occurred about 3 hrs afterwards - orthostatic vitals positive on admission, resolved after IV hydration. but after ambulating with her in the hall, I note no symptoms of orthostatics - CT head negative - cardiac enzymes negative - ECHO shows no wall motion abnormalities- unrevealing as to cause  - EKG shows T wave inversion in aVL and V 2 adn Q waves in inf leads (NEW)- chest pain 3 wks ago could have been stress but may have been an MI, Dr. is one discussed with cardiology, and plan is for stress test today, as well event monitor on discharge.  Laceration - sutured with Vicryl- If not dissolved in 5 days, need to be removed  Chronic pain - cont home meds to see her response to her usual doses  Smoker  - advised to  stop  Antibiotics: Anti-infectives    None     Code Status:     Code Status Orders        Start     Ordered   04/29/15 0148  Full code   Continuous     04/29/15 0147    Code Status History    Date Active Date Inactive Code Status Order ID Comments User Context   This patient has a current code status but no historical code status.    Advance Directive Documentation        Most Recent Value   Type of Advance Directive  Living will, Healthcare Power of Attorney   Pre-existing out of facility DNR order (yellow form or pink MOST form)     "MOST" Form in Place?       Family Communication:  Disposition Plan: stress test tomorrow- if negative, hopefully home tomorrow DVT prophylaxis: Lovenox Consultants: have request cards consult Procedures:  ECHO- Study Conclusions  - Left ventricle: The cavity size was normal. There was mild  concentric hypertrophy. Systolic function was normal. The  estimated ejection fraction was in the range of 55% to 60%. Wall  motion was normal; there were no regional wall motion  abnormalities. Doppler parameters are consistent with abnormal  left ventricular relaxation (grade 1 diastolic dysfunction).   Objective: Filed Weights   04/29/15 0126 04/29/15 0605 04/30/15 0400  Weight: 66.996 kg (147 lb 11.2 oz) 65.499 kg (144 lb 6.4 oz) 66.361 kg (146 lb 4.8 oz)    Intake/Output Summary (  Last 24 hours) at 04/30/15 1317 Last data filed at 04/30/15 0404  Gross per 24 hour  Intake      0 ml  Output   1975 ml  Net  -1975 ml     Vitals Filed Vitals:   04/30/15 1141 04/30/15 1143 04/30/15 1145 04/30/15 1146  BP: 144/87 143/87 145/95   Pulse: 90 94 86 88  Temp:      TempSrc:      Resp:      Height:      Weight:      SpO2:        Exam:  General:  Pt is alert, not in acute distress  HEENT: No icterus, No thrush, oral mucosa moist- laceration on forehead is clean  Cardiovascular: regular rate and rhythm, S1/S2 No  murmur  Respiratory: clear to auscultation bilaterally   Abdomen: Soft, +Bowel sounds, non tender, non distended, no guarding  MSK: No cyanosis or clubbing- no pedal edema   Data Reviewed: Basic Metabolic Panel:  Recent Labs Lab 04/28/15 2259 04/29/15 0450  NA 141 141  K 3.7 3.9  CL 110 108  CO2 23 24  GLUCOSE 103* 97  BUN 12 10  CREATININE 0.77 0.86  CALCIUM 9.2 9.2   Liver Function Tests:  Recent Labs Lab 04/28/15 2259  AST 23  ALT 17  ALKPHOS 94  BILITOT 0.4  PROT 6.9  ALBUMIN 4.1   No results for input(s): LIPASE, AMYLASE in the last 168 hours. No results for input(s): AMMONIA in the last 168 hours. CBC:  Recent Labs Lab 04/28/15 2259 04/29/15 0450  WBC 12.5* 10.1  NEUTROABS 10.0*  --   HGB 12.9 12.5  HCT 40.5 39.0  MCV 96.7 96.8  PLT 255 240   Cardiac Enzymes:  Recent Labs Lab 04/28/15 2259 04/29/15 1019  CKTOTAL  --  89  TROPONINI <0.03 <0.03   BNP (last 3 results) No results for input(s): BNP in the last 8760 hours.  ProBNP (last 3 results) No results for input(s): PROBNP in the last 8760 hours.  CBG:  Recent Labs Lab 04/28/15 1953 04/29/15 0804 04/30/15 0751  GLUCAP 166* 144* 98    No results found for this or any previous visit (from the past 240 hour(s)).   Studies: Dg Chest 2 View  04/28/2015  CLINICAL DATA:  Syncope and fall.  Chronically short of breath. EXAM: CHEST  2 VIEW COMPARISON:  Chest x-ray dated 06/05/2014. FINDINGS: Heart size is normal. Overall cardiomediastinal silhouette is stable in size and configuration. Lungs are at least mildly hyperexpanded suggesting COPD. Lungs are clear. No pleural effusion. No pneumothorax. Mild degenerative change again noted within the thoracic spine. No acute- appearing osseous abnormality. IMPRESSION: 1. Lungs are clear and there is no evidence of acute cardiopulmonary abnormality. 2. Lungs at least mildly hyperexpanded suggesting COPD. Electronically Signed   By: Franki Cabot  M.D.   On: 04/28/2015 22:56   Ct Head Wo Contrast  04/28/2015  CLINICAL DATA:  Status post fall. Dizziness. Large laceration at the mid forehead. Initial encounter. EXAM: CT HEAD WITHOUT CONTRAST TECHNIQUE: Contiguous axial images were obtained from the base of the skull through the vertex without intravenous contrast. COMPARISON:  CT of the head performed 05/04/2006 FINDINGS: There is no evidence of acute infarction, mass lesion, or intra- or extra-axial hemorrhage on CT. Mild periventricular white matter change likely reflects small vessel ischemic microangiopathy. The posterior fossa, including the cerebellum, brainstem and fourth ventricle, is within normal limits. The third  and lateral ventricles, and basal ganglia are unremarkable in appearance. The cerebral hemispheres are symmetric in appearance, with normal gray-white differentiation. No mass effect or midline shift is seen. There is no evidence of fracture; visualized osseous structures are unremarkable in appearance. The visualized portions of the orbits are within normal limits. The paranasal sinuses and mastoid air cells are well-aerated. A large soft tissue laceration is noted overlying the frontal calvarium, extending to the calvarium. IMPRESSION: 1. No evidence of traumatic intracranial injury or fracture. 2. Large soft tissue laceration overlying the frontal calvarium, extending to the calvarium. 3. Mild small vessel ischemic microangiopathy. Electronically Signed   By: Garald Balding M.D.   On: 04/28/2015 22:57    Scheduled Meds:  Scheduled Meds: . enoxaparin (LOVENOX) injection  40 mg Subcutaneous Q24H  . FLUoxetine  40 mg Oral BID  . simvastatin  40 mg Oral QHS  . sodium chloride flush  3 mL Intravenous Q12H  . topiramate  50 mg Oral QHS   Continuous Infusions:   Time spent on care of this patient: 25 min   Magnum Lunde, MD Pager [phone] 04/30/2015, 1:17 PM    [facility] Office  [phone] Pager -  Text Page per [email or web address] If 7PM-7AM, please contact night-coverage [email or web address]

## 2015-05-01 DIAGNOSIS — S0181XS Laceration without foreign body of other part of head, sequela: Secondary | ICD-10-CM | POA: Diagnosis not present

## 2015-05-01 DIAGNOSIS — F17200 Nicotine dependence, unspecified, uncomplicated: Secondary | ICD-10-CM | POA: Diagnosis not present

## 2015-05-01 DIAGNOSIS — R55 Syncope and collapse: Secondary | ICD-10-CM | POA: Diagnosis not present

## 2015-05-01 DIAGNOSIS — G894 Chronic pain syndrome: Secondary | ICD-10-CM | POA: Diagnosis not present

## 2015-05-01 LAB — BASIC METABOLIC PANEL
ANION GAP: 8 (ref 5–15)
BUN: 13 mg/dL (ref 6–20)
CALCIUM: 9.5 mg/dL (ref 8.9–10.3)
CHLORIDE: 108 mmol/L (ref 101–111)
CO2: 25 mmol/L (ref 22–32)
CREATININE: 0.88 mg/dL (ref 0.44–1.00)
GFR calc non Af Amer: >60 mL/min (ref >60)
Glucose, Bld: 103 mg/dL — ABNORMAL HIGH (ref 65–99)
Potassium: 4.4 mmol/L (ref 3.5–5.1)
SODIUM: 141 mmol/L (ref 135–145)

## 2015-05-01 LAB — CBC
HEMATOCRIT: 42.3 % (ref 36.0–46.0)
HEMOGLOBIN: 13.5 g/dL (ref 12.0–15.0)
MCH: 31 pg (ref 26.0–34.0)
MCHC: 31.9 g/dL (ref 30.0–36.0)
MCV: 97 fL (ref 78.0–100.0)
Platelets: 252 10*3/uL (ref 150–400)
RBC: 4.36 MIL/uL (ref 3.87–5.11)
RDW: 13 % (ref 11.5–15.5)
WBC: 6.7 10*3/uL (ref 4.0–10.5)

## 2015-05-01 LAB — GLUCOSE, CAPILLARY: GLUCOSE-CAPILLARY: 89 mg/dL (ref 65–99)

## 2015-05-01 MED ORDER — OXYCODONE HCL 5 MG PO TABS
5.0000 mg | ORAL_TABLET | Freq: Four times a day (QID) | ORAL | Status: DC | PRN
  Administered 2015-05-01: 5 mg via ORAL
  Filled 2015-05-01: qty 1

## 2015-05-01 NOTE — Progress Notes (Signed)
Completed discharge teaching with patient. Answered all questions. Discussed med list. Patient will be discharged with family in stable condition.

## 2015-05-01 NOTE — Discharge Summary (Signed)
Amber Hickman, is a 60 y.o. female  DOB [birth year]  MRN [id number].  Admission date:  04/28/2015  Admitting Physician  Edwin Dada, MD  Discharge Date:  05/01/2015   Primary MD  Alesia Richards, MD  Recommendations for primary care physician for things to follow:  - Please check CBC, BMP during next visit - Patient to follow with cardiology as an outpatient regarding event monitor   Admission Diagnosis  Forehead laceration, initial encounter [S01.81XA] Syncope, unspecified syncope type [R55]   Discharge Diagnosis  Forehead laceration, initial encounter [S01.81XA] Syncope, unspecified syncope type [R55]   Principal Problem:   Syncope Active Problems:   COPD (chronic obstructive pulmonary disease) (HCC)   Tobacco use disorder   Chronic pain syndrome   Forehead laceration   Nonspecific abnormal electrocardiogram (ECG) (EKG)      Past Medical History  Diagnosis Date  . Hypertension   . Hyperlipidemia   . Depression   . GERD (gastroesophageal reflux disease)   . Arthritis   . COPD (chronic obstructive pulmonary disease) ([place])   . Vitamin D deficiency   . AVN (avascular necrosis of bone) (HCC)     Left shoulder  . Allergy   . Diabetes mellitus without complication (HCC)     Pre-diabetes.     Past Surgical History  Procedure Laterality Date  . Cataract extraction Bilateral   . Spine surgery      lumbar  . Ankle surgery Right   . Tonsillectomy and adenoidectomy         History of present illness and  Hospital Course:     Kindly see H&P for history of present illness and admission details, please review complete Labs, Consult reports and Test reports for all details in brief  HPI  from the history and physical done on the day of admission 04/29/2015  HPI: Amber Hickman is a 60 y.o. female with a past medical history significant for HTN and chronic pain on  opioid therapy who presents with syncope and head laceration.  The patient was feeling her normal self until this evening after a small supper, she stood up from the couch and walk to the kitchen and passed out. She remembers no prodrome, no dizziness, no room spinning, no vertigo, no ringing in the ears, no palpitations, no chest pressure. Afterwards, she felt shaky but not sluggish or groggy, she had not soiled herself, and she found that she was bleeding heavily from her head, so she came to the ER.  She notes that she had another episode like this about 1 month ago, with minimal prodrome, in which she passed out while standing up, but mostly caught herself, because she was aroused by bottle hitting her leg. She thinks both of these episodes may have happened after taking her oxycodone. She normally takes about 5 tablets of oxycodone per day. She takes 10-20 mg at bedtime. She has started no other new medicines recently. There is no family history of sudden cardiac death, unexplained death, death while swimming,  HOCM, arrhythmia, or channelopathy that she knows of. Father had premature cardiovascular disease, but no other heart disease in the family.  In the ED, the patient was hemodynamically stable and had a large head laceration. This was sutured, and TRH were asked to evaluate for syncope observation.   Hospital Course   60 year old female with a history of hypertension and chronic pain on opioids who presented to the hospital for syncope. She states that she took her usual dose of Percocet and tizanidine around 3:00. The syncopal episodes of occurred soon after 6:00. She was walking around in the house when she suddenly fell forward. When she woke up she had a large laceration on her forehead and was bleeding. She states that she did not feel lightheaded prior to the episode. No complaint of palpitations chest pain or focal neurological defects. She had not lost control of her bowel or bladder.  She admitted to having intense chest pain radiating to her back and left shoulder about 3 wks ago after a funeral lasting about 45 min resolving on its own.  Syncope - could have been related to narcotics/ muscle relaxants but she states she did not take more than her usual and episode occurred about 3 hrs afterwards, she was encouraged to decrease her dose, she is following with pain medicine physician. - orthostatic vitals positive on admission, resolved after IV hydration. Not orthostatic anymore, and elevated in the hallway multiple times without recurrence of dizziness or syncope, encouraged to increase fluid intake - CT head negative - cardiac enzymes negative - ECHO shows no wall motion abnormalities- unrevealing as to cause  - EKG shows T wave inversion in aVL and V 2 adn Q waves in inf leads (NEW)- chest pain 3 wks ago could have been stress but may have been an MI, , so patient underwent nuclear stress test by cardiology, which was low risk . - Patient will have event monitor arranged by cardiology as an outpatient, he was monitored during hospital stay on telemetry, with no significant arrhythmias or pauses.  Laceration - sutured with Vicryl- If not dissolved in 5 days, need to be removed  Chronic pain - cont home meds to see her response to her usual doses  Smoker  - advised to stop, she reported she will stop smoking.  Discharge Condition:  stable   Follow UP  Follow-up Information    Follow up with [facility], PA-C.   Specialties:  Cardiology, Radiology   Why:  Office will contact you about monitor   Contact information:   [place] [address] [phone]       Follow up with Unk Pinto DAVID, MD. Schedule an appointment as soon as possible for a visit in 1 week.   Specialty:  Internal Medicine   Contact information:   [address] [place] [address] [phone]         Discharge Instructions  and   Discharge Medications     Discharge Instructions    Diet - low sodium heart healthy    Complete by:  As directed      Discharge instructions    Complete by:  As directed   Follow with Primary MD Unk Pinto DAVID, MD in 7 days   Get CBC, CMP, checked  by Primary MD next visit.   Do not drive, operating heavy machinery, perform activities at heights, swimming or participation in water activities or provide baby sitting services if your were admitted for  syncope or siezures until you have seen by Primary MD or a Neurologist and advised to do so again.  Activity: As tolerated with Full fall precautions use walker/cane & assistance as needed   Disposition Home   Diet: Heart Healthy  , with feeding assistance and aspiration precautions.  For Heart failure patients - Check your Weight same time everyday, if you gain over 2 pounds, or you develop in leg swelling, experience more shortness of breath or chest pain, call your Primary MD immediately. Follow Cardiac Low Salt Diet and 1.5 lit/day fluid restriction.   On your next visit with your primary care physician please Get Medicines reviewed and adjusted.   Please request your Prim.MD to go over all Hospital Tests and Procedure/Radiological results at the follow up, please get all Hospital records sent to your Prim MD by signing hospital release before you go home.   If you experience worsening of your admission symptoms, develop shortness of breath, life threatening emergency, suicidal or homicidal thoughts you must seek medical attention immediately by calling 911 or calling your MD immediately  if symptoms less severe.  You Must read complete instructions/literature along with all the possible adverse reactions/side effects for all the Medicines you take and that have been prescribed to you. Take any new Medicines after you have completely understood and accpet all the possible adverse reactions/side effects.     Do not drive  when taking Pain medications.    Do not take more than prescribed Pain, Sleep and Anxiety Medications  Special Instructions: If you have smoked or chewed Tobacco  in the last 2 yrs please stop smoking, stop any regular Alcohol  and or any Recreational drug use.  Wear Seat belts while driving.   Please note  You were cared for by a hospitalist during your hospital stay. If you have any questions about your discharge medications or the care you received while you were in the hospital after you are discharged, you can call the unit and asked to speak with the hospitalist on call if the hospitalist that took care of you is not available. Once you are discharged, your primary care physician will handle any further medical issues. Please note that NO REFILLS for any discharge medications will be authorized once you are discharged, as it is imperative that you return to your primary care physician (or establish a relationship with a primary care physician if you do not have one) for your aftercare needs so that they can reassess your need for medications and monitor your lab values.     Increase activity slowly    Complete by:  As directed             Medication List    TAKE these medications        albuterol 108 (90 Base) MCG/ACT inhaler  Commonly known as:  PROVENTIL HFA;VENTOLIN HFA  Inhale 2 puffs into the lungs every 6 (six) hours as needed for wheezing or shortness of breath.     cholecalciferol 1000 units tablet  Commonly known as:  VITAMIN D  Take 5,000 Units by mouth daily.     clobetasol 0.05 % external solution  Commonly known as:  TEMOVATE  apply 1 APPLICATION to affected area twice a day     FLUoxetine 40 MG capsule  Commonly known as:  PROZAC  take 1 capsule by mouth twice a day     gabapentin 300 MG capsule  Commonly known as:  NEURONTIN  Take 300 mg  by mouth 2 (two) times daily as needed (pain).     oxyCODONE-acetaminophen 10-325 MG tablet  Commonly known as:   PERCOCET  Take 1 tablet by mouth every 6 (six) hours as needed for pain.     simvastatin 40 MG tablet  Commonly known as:  ZOCOR  take 1 tablet by mouth at bedtime     tiZANidine 2 MG tablet  Commonly known as:  ZANAFLEX  Take 2 mg by mouth every 6 (six) hours as needed for muscle spasms.     topiramate 25 MG tablet  Commonly known as:  TOPAMAX  Take 50 mg by mouth at bedtime.     varenicline 1 MG tablet  Commonly known as:  CHANTIX CONTINUING MONTH PAK  Take 1 tablet (1 mg total) by mouth 2 (two) times daily.          Diet and Activity recommendation: See Discharge Instructions above   Consults obtained -  none   Major procedures and Radiology Reports - PLEASE review detailed and final reports for all details, in brief -      Dg Chest 2 View  04/28/2015  CLINICAL DATA:  Syncope and fall.  Chronically short of breath. EXAM: CHEST  2 VIEW COMPARISON:  Chest x-ray dated 06/05/2014. FINDINGS: Heart size is normal. Overall cardiomediastinal silhouette is stable in size and configuration. Lungs are at least mildly hyperexpanded suggesting COPD. Lungs are clear. No pleural effusion. No pneumothorax. Mild degenerative change again noted within the thoracic spine. No acute- appearing osseous abnormality. IMPRESSION: 1. Lungs are clear and there is no evidence of acute cardiopulmonary abnormality. 2. Lungs at least mildly hyperexpanded suggesting COPD. Electronically Signed   By: Franki Cabot M.D.   On: 04/28/2015 22:56   Ct Head Wo Contrast  04/28/2015  CLINICAL DATA:  Status post fall. Dizziness. Large laceration at the mid forehead. Initial encounter. EXAM: CT HEAD WITHOUT CONTRAST TECHNIQUE: Contiguous axial images were obtained from the base of the skull through the vertex without intravenous contrast. COMPARISON:  CT of the head performed 05/04/2006 FINDINGS: There is no evidence of acute infarction, mass lesion, or intra- or extra-axial hemorrhage on CT. Mild periventricular  white matter change likely reflects small vessel ischemic microangiopathy. The posterior fossa, including the cerebellum, brainstem and fourth ventricle, is within normal limits. The third and lateral ventricles, and basal ganglia are unremarkable in appearance. The cerebral hemispheres are symmetric in appearance, with normal gray-white differentiation. No mass effect or midline shift is seen. There is no evidence of fracture; visualized osseous structures are unremarkable in appearance. The visualized portions of the orbits are within normal limits. The paranasal sinuses and mastoid air cells are well-aerated. A large soft tissue laceration is noted overlying the frontal calvarium, extending to the calvarium. IMPRESSION: 1. No evidence of traumatic intracranial injury or fracture. 2. Large soft tissue laceration overlying the frontal calvarium, extending to the calvarium. 3. Mild small vessel ischemic microangiopathy. Electronically Signed   By: Garald Balding M.D.   On: 04/28/2015 22:57   Nm Myocar Multi W/spect W/wall Motion / Ef  04/30/2015  CLINICAL DATA:  60 year old female with a history of hypertension, diabetes mellitus. Chest pain. EXAM: MYOCARDIAL IMAGING WITH SPECT (REST AND PHARMACOLOGIC-STRESS) GATED LEFT VENTRICULAR WALL MOTION STUDY LEFT VENTRICULAR EJECTION FRACTION TECHNIQUE: Standard myocardial SPECT imaging was performed after resting intravenous injection of 10 mCi Tc-61m sestamibi. Subsequently, intravenous infusion of Lexiscan was performed under the supervision of the Cardiology staff. At peak effect of the drug, 30  mCi Tc-63m sestamibi was injected intravenously and standard myocardial SPECT imaging was performed. Quantitative gated imaging was also performed to evaluate left ventricular wall motion, and estimate left ventricular ejection fraction. COMPARISON:  None. FINDINGS: Perfusion: No decreased activity in the left ventricle on stress imaging to suggest reversible ischemia or  infarction. Wall Motion: Normal left ventricular wall motion. No left ventricular dilation. Left Ventricular Ejection Fraction: 57 % End diastolic volume 53 ml End systolic volume 22 ml IMPRESSION: 1. No reversible ischemia or infarction. 2. Normal left ventricular wall motion. 3. Left ventricular ejection fraction 57% 4. Low-risk stress test findings*. *2012 Appropriate Use Criteria for Coronary Revascularization Focused Update: J Am Coll Cardiol. [id number]. http://content.airportbarriers.com.aspx?articleid=[email or web address] Electronically Signed   By: Corrie Mckusick D.O.   On: 04/30/2015 14:38    Micro Results     No results found for this or any previous visit (from the past 240 hour(s)).     Today   Subjective:   Milaysia Trahan today has no headache,no chest abdominal pain,no new weakness tingling or numbness, feels much better wants to go home today.   Objective:   Blood pressure 139/71, pulse 59, temperature 97.4 F (36.3 C), temperature source Oral, resp. rate 18, height 5\' 4"  (1.626 m), weight 65.726 kg (144 lb 14.4 oz), SpO2 99 %.   Intake/Output Summary (Last 24 hours) at 05/01/15 1358 Last data filed at 05/01/15 0510  Gross per 24 hour  Intake      0 ml  Output   1450 ml  Net  -1450 ml    Exam Awake Alert, Oriented x 3, No new F.N deficits, Normal affect [place].AT,PERRAL, laceration on forehead is clean Supple Neck,No JVD, No cervical lymphadenopathy appriciated.  Symmetrical Chest wall movement, Good air movement bilaterally, CTAB RRR,No Gallops,Rubs or new Murmurs, No Parasternal Heave +ve B.Sounds, Abd Soft, Non tender, No organomegaly appriciated, No rebound -guarding or rigidity. No Cyanosis, Clubbing or edema, No new Rash or bruise  Data Review   CBC w Diff: Lab Results  Component Value Date   WBC 6.7 05/01/2015   HGB 13.5 05/01/2015   HCT 42.3 05/01/2015   PLT 252 05/01/2015   LYMPHOPCT 14 04/28/2015   MONOPCT 6 04/28/2015   EOSPCT 0 04/28/2015    BASOPCT 0 04/28/2015    CMP: Lab Results  Component Value Date   NA 141 05/01/2015   K 4.4 05/01/2015   CL 108 05/01/2015   CO2 25 05/01/2015   BUN 13 05/01/2015   CREATININE 0.88 05/01/2015   CREATININE 0.96 02/26/2015   PROT 6.9 04/28/2015   ALBUMIN 4.1 04/28/2015   BILITOT 0.4 04/28/2015   ALKPHOS 94 04/28/2015   AST 23 04/28/2015   ALT 17 04/28/2015  .   Total Time in preparing paper work, data evaluation and todays exam - 35 minutes  Tadd Holtmeyer M.D on 05/01/2015 at 1:58 PM  [facility]   Office  [phone]

## 2015-05-01 NOTE — Discharge Instructions (Signed)
Follow with Primary MD Amber Pinto DAVID, MD in 7 days   Get CBC, CMP, checked  by Primary MD next visit.   Do not drive, operating heavy machinery, perform activities at heights, swimming or participation in water activities or provide baby sitting services if your were admitted for syncope or siezures until you have seen by Primary MD or a Neurologist and advised to do so again.  Activity: As tolerated with Full fall precautions use walker/cane & assistance as needed   Disposition Home   Diet: Heart Healthy  , with feeding assistance and aspiration precautions.  For Heart failure patients - Check your Weight same time everyday, if you gain over 2 pounds, or you develop in leg swelling, experience more shortness of breath or chest pain, call your Primary MD immediately. Follow Cardiac Low Salt Diet and 1.5 lit/day fluid restriction.   On your next visit with your primary care physician please Get Medicines reviewed and adjusted.   Please request your Prim.MD to go over all Hospital Tests and Procedure/Radiological results at the follow up, please get all Hospital records sent to your Prim MD by signing hospital release before you go home.   If you experience worsening of your admission symptoms, develop shortness of breath, life threatening emergency, suicidal or homicidal thoughts you must seek medical attention immediately by calling 911 or calling your MD immediately  if symptoms less severe.  You Must read complete instructions/literature along with all the possible adverse reactions/side effects for all the Medicines you take and that have been prescribed to you. Take any new Medicines after you have completely understood and accpet all the possible adverse reactions/side effects.     Do not drive when taking Pain medications.    Do not take more than prescribed Pain, Sleep and Anxiety Medications  Special Instructions: If you have smoked or chewed Tobacco  in the last 2  yrs please stop smoking, stop any regular Alcohol  and or any Recreational drug use.  Wear Seat belts while driving.   Please note  You were cared for by a hospitalist during your hospital stay. If you have any questions about your discharge medications or the care you received while you were in the hospital after you are discharged, you can call the unit and asked to speak with the hospitalist on call if the hospitalist that took care of you is not available. Once you are discharged, your primary care physician will handle any further medical issues. Please note that NO REFILLS for any discharge medications will be authorized once you are discharged, as it is imperative that you return to your primary care physician (or establish a relationship with a primary care physician if you do not have one) for your aftercare needs so that they can reassess your need for medications and monitor your lab values.

## 2015-05-01 NOTE — Evaluation (Signed)
Physical Therapy One Time Evaluation Patient Details Name: Amber Hickman MRN: [id number] DOB: 04/12/1955 Today's Date: 05/01/2015   History of Present Illness  60 year old female with a history of hypertension and chronic pain on opioids who presented to the hospital for syncope  Clinical Impression  Patient evaluated by Physical Therapy with no further acute PT needs identified. All education has been completed and the patient has no further questions. Pt ambulated around unit without any symptoms and hopeful for d/c home today. See below for any follow-up Physical Therapy or equipment needs. PT is signing off. Thank you for this referral.     Follow Up Recommendations No PT follow up    Equipment Recommendations  None recommended by PT    Recommendations for Other Services       Precautions / Restrictions Precautions Precautions: Fall Restrictions Weight Bearing Restrictions: No      Mobility  Bed Mobility Overal bed mobility: Modified Independent                Transfers Overall transfer level: Modified independent                  Ambulation/Gait Ambulation/Gait assistance: Supervision;Min guard Ambulation Distance (Feet): 400 Feet Assistive device: None Gait Pattern/deviations: Step-through pattern;Drifts right/left     General Gait Details: no symptoms during ambulation, a couple instances of unsteadiness however pt able to self correct - reports due to being in hosital bed so long and hx of chronic back pain  Stairs            Wheelchair Mobility    Modified Rankin (Stroke Patients Only)       Balance Overall balance assessment:  (reports only 2 syncope episodes causing falls recently, otherwise no falls)                                           Pertinent Vitals/Pain Pain Assessment: 0-10 Pain Score:  (not rated) Pain Location: reports chronic back pain Pain Intervention(s): Repositioned;Monitored during  session    Home Living Family/patient expects to be discharged to:: Private residence Living Arrangements: Alone   Type of Home: House       Home Layout: One level Home Equipment: None      Prior Function Level of Independence: Independent               Hand Dominance        Extremity/Trunk Assessment   Upper Extremity Assessment: Overall WFL for tasks assessed           Lower Extremity Assessment: Overall WFL for tasks assessed      Cervical / Trunk Assessment: Normal  Communication   Communication: No difficulties  Cognition Arousal/Alertness: Awake/alert Behavior During Therapy: WFL for tasks assessed/performed Overall Cognitive Status: Within Functional Limits for tasks assessed                      General Comments      Exercises        Assessment/Plan    PT Assessment Patent does not need any further PT services  PT Diagnosis Difficulty walking   PT Problem List    PT Treatment Interventions     PT Goals (Current goals can be found in the Care Plan section) Acute Rehab PT Goals PT Goal Formulation: All assessment and education complete, DC therapy  Frequency     Barriers to discharge        Co-evaluation               End of Session Equipment Utilized During Treatment: Gait belt Activity Tolerance: Patient tolerated treatment well Patient left: in bed;with call bell/phone within reach;with bed alarm set Nurse Communication: Mobility status    Functional Assessment Tool Used: clinical judgement Functional Limitation: Mobility: Walking and moving around Mobility: Walking and Moving Around Current Status [id number]): At least 1 percent but less than 20 percent impaired, limited or restricted Mobility: Walking and Moving Around Goal Status [phone]): At least 1 percent but less than 20 percent impaired, limited or restricted Mobility: Walking and Moving Around Discharge Status [phone]): At least 1 percent but less than 20  percent impaired, limited or restricted    Time: 0917-0930 PT Time Calculation (min) (ACUTE ONLY): 13 min   Charges:   PT Evaluation $PT Eval Low Complexity: 1 Procedure     PT G Codes:   PT G-Codes **NOT FOR INPATIENT CLASS** Functional Assessment Tool Used: clinical judgement Functional Limitation: Mobility: Walking and moving around Mobility: Walking and Moving Around Current Status [id number]): At least 1 percent but less than 20 percent impaired, limited or restricted Mobility: Walking and Moving Around Goal Status [phone]): At least 1 percent but less than 20 percent impaired, limited or restricted Mobility: Walking and Moving Around Discharge Status [phone]): At least 1 percent but less than 20 percent impaired, limited or restricted    Charmika Macdonnell,KATHrine E 05/01/2015, 10:24 AM Carmelia Bake, PT, DPT 05/01/2015 Pager: [phone]

## 2015-05-05 ENCOUNTER — Ambulatory Visit (INDEPENDENT_AMBULATORY_CARE_PROVIDER_SITE_OTHER): Payer: Medicare Other

## 2015-05-05 ENCOUNTER — Ambulatory Visit (INDEPENDENT_AMBULATORY_CARE_PROVIDER_SITE_OTHER): Payer: Medicare Other | Admitting: Internal Medicine

## 2015-05-05 ENCOUNTER — Encounter: Payer: Self-pay | Admitting: Internal Medicine

## 2015-05-05 VITALS — BP 106/68 | HR 80 | Temp 97.3°F | Resp 16 | Ht 64.0 in | Wt 149.2 lb

## 2015-05-05 DIAGNOSIS — R55 Syncope and collapse: Secondary | ICD-10-CM | POA: Diagnosis not present

## 2015-05-05 DIAGNOSIS — Z79899 Other long term (current) drug therapy: Secondary | ICD-10-CM

## 2015-05-05 DIAGNOSIS — I10 Essential (primary) hypertension: Secondary | ICD-10-CM | POA: Diagnosis not present

## 2015-05-05 LAB — CBC WITH DIFFERENTIAL/PLATELET
BASOS ABS: 0.1 10*3/uL (ref 0.0–0.1)
Basophils Relative: 1 % (ref 0–1)
EOS ABS: 0.1 10*3/uL (ref 0.0–0.7)
Eosinophils Relative: 2 % (ref 0–5)
HCT: 39.9 % (ref 36.0–46.0)
Hemoglobin: 13.2 g/dL (ref 12.0–15.0)
LYMPHS ABS: 2.3 10*3/uL (ref 0.7–4.0)
Lymphocytes Relative: 36 % (ref 12–46)
MCH: 30.8 pg (ref 26.0–34.0)
MCHC: 33.1 g/dL (ref 30.0–36.0)
MCV: 93 fL (ref 78.0–100.0)
MPV: 10.3 fL (ref 8.6–12.4)
Monocytes Absolute: 0.4 10*3/uL (ref 0.1–1.0)
Monocytes Relative: 7 % (ref 3–12)
NEUTROS PCT: 54 % (ref 43–77)
Neutro Abs: 3.4 10*3/uL (ref 1.7–7.7)
PLATELETS: 297 10*3/uL (ref 150–400)
RBC: 4.29 MIL/uL (ref 3.87–5.11)
RDW: 13 % (ref 11.5–15.5)
WBC: 6.3 10*3/uL (ref 4.0–10.5)

## 2015-05-05 NOTE — Patient Instructions (Signed)

## 2015-05-05 NOTE — Progress Notes (Signed)
Subjective:    Patient ID: Amber Hickman, female    DOB: February 14, 1956, 60 y.o.   MRN: [id number]  HPI Patient is a 60 yo SWF presenting for re-check and suture removal of a large forehead laceration resultant of a syncopal episode and hospitalization 3/13-16/2017 with negative Head CTscan, 2D EC and Exercise stress test. Currently she is wearing a 30 day event monitor.Patient is on a variety of potentialyy sedating medications including Oxycodone that she take from  A pain clinic for chronic LBP due to DDD.   Medication Sig  . albuterol (PROVENTIL HFA;VENTOLIN HFA) 108 (90 BASE) MCG/ACT inhaler Inhale 2 puffs into the lungs every 6 (six) hours as needed for wheezing or shortness of breath.  . cholecalciferol (VITAMIN D) 1000 UNITS tablet Take 5,000 Units by mouth daily.  . clobetasol (TEMOVATE) 0.05 % external solution apply 1 APPLICATION to affected area twice a day  . FLUoxetine (PROZAC) 40 MG capsule take 1 capsule by mouth twice a day (Patient taking differently: Take 80 mg by mouth once daily)  . gabapentin (NEURONTIN) 300 MG capsule Take 300 mg by mouth 2 (two) times daily as needed (pain).   Marland Kitchen oxyCODONE-acetaminophen (PERCOCET) 10-325 MG per tablet Take 1 tablet by mouth every 6 (six) hours as needed for pain.  . simvastatin (ZOCOR) 40 MG tablet take 1 tablet by mouth at bedtime (Patient taking differently: Take 20 mg by mouth at bedtime)  . tiZANidine (ZANAFLEX) 2 MG tablet Take 2 mg by mouth every 6 (six) hours as needed for muscle spasms.  Marland Kitchen topiramate (TOPAMAX) 25 MG tablet Take 50 mg by mouth at bedtime.   . varenicline (CHANTIX CONTINUING MONTH PAK) 1 MG tablet Take 1 tablet (1 mg total) by mouth 2 (two) times daily.    Allergies  Allergen Reactions  . Augmentin [Amoxicillin-Pot Clavulanate]       . Codeine Nausea And Vomiting  . Lipitor [Atorvastatin]     Leg pain  . Wellbutrin [Bupropion]     Pt does not recall reaction   . Zoloft [Sertraline Hcl]     Pt does not recall  reaction    Past Medical History  Diagnosis Date  . Hypertension   . Hyperlipidemia   . Depression   . GERD (gastroesophageal reflux disease)   . Arthritis   . COPD (chronic obstructive pulmonary disease) ([place])   . Vitamin D deficiency   . AVN (avascular necrosis of bone) (HCC)     Left shoulder  . Allergy   . Diabetes mellitus without complication (HCC)     Pre-diabetes.    Past Surgical History  Procedure Laterality Date  . Cataract extraction Bilateral   . Spine surgery      lumbar  . Ankle surgery Right   . Tonsillectomy and adenoidectomy     Review of Systems  10 point systems review negative except as above.    Objective:   Physical Exam  BP 106/68 mmHg  Pulse 80  Temp(Src) 97.3 F (36.3 C)  Resp 16  Ht 5\' 4"  (1.626 m)  Wt 149 lb 3.2 oz (67.677 kg)  BMI 25.60 kg/m2   Orthostatic BP with BP 111/71 w/ P60 sitting and standing BP104/71 w/P70   In NAD. Large 3" inverted curvilinear healnig laceration concave downward across the mid brow.   HEENT - Eac's patent. TM's Nl. EOM's full. PERRLA. NasoOroPharynx clear. Neck - supple. Nl Thyroid. Carotids 2+ & No bruits, nodes, JVD Chest - Clear equal BS w/o Rales, rhonchi,  wheezes. Cor - Nl HS. RRR w/o sig MGR. PP 1(+). No edema. Abd - No palpable organomegaly, masses or tenderness. BS nl. MS- FROM w/o deformities. Muscle power, tone and bulk Nl. Gait Nl. Neuro - No obvious Cr N abnormalities. Sensory, motor and Cerebellar functions appear Nl w/o focal abnormalities. Psyche - Mental status normal & appropriate.  No delusions, ideations or obvious mood abnormalities.    Assessment & Plan:   1. Essential hypertension, labile    2. Vasovagal syncope / Postural orthostasis  - Encouraged liberal fluid intake and encourage judicious limitation of sedating meds and advised not to drive while taking sedating medications or pain meds.  3. Medication management  - CBC with Differential/Platelet - BASIC METABOLIC PANEL  WITH GFR

## 2015-05-06 LAB — BASIC METABOLIC PANEL WITH GFR
BUN: 12 mg/dL (ref 7–25)
CO2: 24 mmol/L (ref 20–31)
Calcium: 9.8 mg/dL (ref 8.6–10.4)
Chloride: 108 mmol/L (ref 98–110)
Creat: 0.89 mg/dL (ref 0.50–0.99)
GFR, EST AFRICAN AMERICAN: 81 mL/min (ref >=60)
GFR, EST NON AFRICAN AMERICAN: 71 mL/min (ref >=60)
Glucose, Bld: 96 mg/dL (ref 65–99)
POTASSIUM: 4.1 mmol/L (ref 3.5–5.3)
SODIUM: 141 mmol/L (ref 135–146)

## 2015-05-27 ENCOUNTER — Telehealth: Payer: Self-pay | Admitting: *Deleted

## 2015-05-27 NOTE — Telephone Encounter (Signed)
Pt aware of negative Cologaurd  results

## 2015-06-05 ENCOUNTER — Ambulatory Visit (INDEPENDENT_AMBULATORY_CARE_PROVIDER_SITE_OTHER): Payer: Medicare Other | Admitting: Internal Medicine

## 2015-06-05 ENCOUNTER — Encounter: Payer: Self-pay | Admitting: Internal Medicine

## 2015-06-05 VITALS — BP 116/74 | HR 100 | Temp 97.6°F | Resp 16 | Ht 64.0 in | Wt 144.0 lb

## 2015-06-05 DIAGNOSIS — E785 Hyperlipidemia, unspecified: Secondary | ICD-10-CM

## 2015-06-05 DIAGNOSIS — R7309 Other abnormal glucose: Secondary | ICD-10-CM

## 2015-06-05 DIAGNOSIS — Z79899 Other long term (current) drug therapy: Secondary | ICD-10-CM

## 2015-06-05 DIAGNOSIS — J449 Chronic obstructive pulmonary disease, unspecified: Secondary | ICD-10-CM | POA: Diagnosis not present

## 2015-06-05 DIAGNOSIS — Z0001 Encounter for general adult medical examination with abnormal findings: Secondary | ICD-10-CM | POA: Diagnosis not present

## 2015-06-05 DIAGNOSIS — I10 Essential (primary) hypertension: Secondary | ICD-10-CM | POA: Insufficient documentation

## 2015-06-05 DIAGNOSIS — Z23 Encounter for immunization: Secondary | ICD-10-CM | POA: Diagnosis not present

## 2015-06-05 DIAGNOSIS — Z1212 Encounter for screening for malignant neoplasm of rectum: Secondary | ICD-10-CM | POA: Diagnosis not present

## 2015-06-05 DIAGNOSIS — Z136 Encounter for screening for cardiovascular disorders: Secondary | ICD-10-CM | POA: Diagnosis not present

## 2015-06-05 DIAGNOSIS — E559 Vitamin D deficiency, unspecified: Secondary | ICD-10-CM | POA: Diagnosis not present

## 2015-06-05 DIAGNOSIS — R6889 Other general symptoms and signs: Secondary | ICD-10-CM

## 2015-06-05 NOTE — Progress Notes (Signed)
Patient ID: Amber Hickman, female   DOB: 08-21-1955, 60 y.o.   MRN: [id number]  Annual Screening/Preventative Visit And Comprehensive Evaluation &  Examination  This very nice 60 y.o. single WF presents for a Wellness/Preventative Visit & comprehensive evaluation and management of multiple medical co-morbidities.  Patient has been followed for HTN, T2_NIDDM  Prediabetes, Hyperlipidemia and Vitamin D Deficiency. 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    HTN predates since 1998 and she had been on treatment until 2010 when her BP normalized attributed to the pain meds she had been started on for her chronic pain syndrome. Since then she has been followed expectantly and BP has been controlled at home and patient denies any cardiac symptoms as chest pain, palpitations, shortness of breath, dizziness or ankle swelling. Today's BP: 116/74 mmHg    Patient's hyperlipidemia is controlled with diet and medications. Patient denies myalgias or other medication SE's. Last lipids were near goal with Cholesterol 200; HDL 55; LDL 104; and elevated Triglycerides 203 on 02/26/2015.    Patient has prediabetes predating since Oct 2016 with A1c 5.7%  and patient denies reactive hypoglycemic symptoms, visual blurring, diabetic polys, or paresthesias. Last A1c was still 5.7% on 02/26/2015.   Finally, patient has history of Vitamin D Deficiency of "20" and last Vitamin D was  53 on 02/26/2015.    Medication Sig  . albuterol  HFA  inhaler Inhale 2 puffs into the lungs every 6 (six) hours as needed for wheezing or shortness of breath.  Marland Kitchen VITAMIN D 1000 UNITS tablet Take 5,000 Units by mouth daily.  . TEMOVATE 0.05 % ext soln apply 1 APPLICATION to affected area twice a day  . FLUoxetine  40 MG capsule take 1 capsule by mouth twice a day (Patient taking  differently: Take 80 mg by mouth once daily)  . gabapentin  300 MG capsule Take 300 mg by mouth 2 (two) times daily as needed (pain).   . PERCOCET 10-325 MG per tablet Take 1 tablet by mouth every 6 (six) hours as needed for pain.  . simvastatin ( 40 MG tablet take 1 tablet by mouth at bedtime (Patient taking differently: Take 20 mg by mouth at bedtime)  . tiZANidine  2 MG tablet Take 2 mg by mouth every 6 (six) hours as needed for muscle spasms.  Marland Kitchen topiramate  25 MG tablet Take 50 mg by mouth at bedtime.   Hendricks Limes CONTINUING MONTH PAK 1 MG tablet Take 1 tablet (1 mg total) by mouth 2 (two) times daily.   Allergies  Allergen Reactions  . Augmentin [Amoxicillin-Pot Clavulanate]   . Codeine Nausea And Vomiting  . Lipitor [Atorvastatin]     Leg pain  . Wellbutrin [Bupropion]     Pt does not recall reaction   . Zoloft [Sertraline Hcl]     Pt does not recall reaction    Past Medical History  Diagnosis Date  . Hypertension   . Hyperlipidemia   . Depression   . GERD (gastroesophageal reflux disease)   . Arthritis   . COPD (chronic obstructive pulmonary disease) ([place])   . Vitamin D deficiency   . AVN (avascular necrosis of bone) (H[facility])     Left shoulder  . Allergy   . Diabetes mellitus without complication ([place])  Pre-diabetes.    Health Maintenance  Topic Date Due  . Hepatitis C Screening  23-Mar-1955  . HIV Screening  02/12/1970  . PAP SMEAR  01/18/2013  . TETANUS/TDAP  01/18/2014  . ZOSTAVAX  02/13/2015  . URINE MICROALBUMIN  05/21/2015  . INFLUENZA VACCINE  09/16/2015  . COLONOSCOPY  01/19/2016  . MAMMOGRAM  06/04/2016   Immunization History  Administered Date(s) Administered  . Td 01/19/2004   Past Surgical History  Procedure Laterality Date  . Cataract extraction Bilateral   . Spine surgery      lumbar  . Ankle surgery Right   . Tonsillectomy and adenoidectomy     Family History  Problem Relation Age of Onset  . Hypertension Mother   . COPD Mother   .  Stroke Mother   . Cancer Mother     breast  . Heart disease Father     Age 64, MI   Social History  Substance Use Topics  . Smoking status: Current Every Day Smoker -- 0.50 packs/day for 30 years  . Smokeless tobacco: Never Used  . Alcohol Use: No    ROS Constitutional: Denies fever, chills, weight loss/gain, headaches, insomnia,  night sweats, and change in appetite. Does c/o fatigue. Eyes: Denies redness, blurred vision, diplopia, discharge, itchy, watery eyes.  ENT: Denies discharge, congestion, post nasal drip, epistaxis, sore throat, earache, hearing loss, dental pain, Tinnitus, Vertigo, Sinus pain, snoring.  Cardio: Denies chest pain, palpitations, irregular heartbeat, syncope, dyspnea, diaphoresis, orthopnea, PND, claudication, edema Respiratory: denies cough, dyspnea, DOE, pleurisy, hoarseness, laryngitis, wheezing.  Gastrointestinal: Denies dysphagia, heartburn, reflux, water brash, pain, cramps, nausea, vomiting, bloating, diarrhea, constipation, hematemesis, melena, hematochezia, jaundice, hemorrhoids Genitourinary: Denies dysuria, frequency, urgency, nocturia, hesitancy, discharge, hematuria, flank pain Breast: Breast lumps, nipple discharge, bleeding.  Musculoskeletal: Denies arthralgia, myalgia, stiffness, Jt. Swelling, pain, limp, and strain/sprain. Denies falls. Skin: Denies puritis, rash, hives, warts, acne, eczema, changing in skin lesion Neuro: No weakness, tremor, incoordination, spasms, paresthesia, pain Psychiatric: Denies confusion, memory loss, sensory loss. Denies Depression. Endocrine: Denies change in weight, skin, hair change, nocturia, and paresthesia, diabetic polys, visual blurring, hyper / hypo glycemic episodes.  Heme/Lymph: No excessive bleeding, bruising, enlarged lymph nodes.  Physical Exam  BP 116/74 mmHg  Pulse 100  Temp(Src) 97.6 F (36.4 C)  Resp 16  Ht 5\' 4"  (1.626 m)  Wt 144 lb (65.318 kg)  BMI 24.71 kg/m2  General Appearance: Well  nourished and in no apparent distress. Eyes: PERRLA, EOMs, conjunctiva no swelling or erythema, normal fundi and vessels. Sinuses: No frontal/maxillary tenderness ENT/Mouth: EACs patent / TMs  nl. Nares clear without erythema, swelling, mucoid exudates. Oral hygiene is good. No erythema, swelling, or exudate. Tongue normal, non-obstructing. Tonsils not swollen or erythematous. Hearing normal.  Neck: Supple, thyroid normal. No bruits, nodes or JVD. Respiratory: Respiratory effort normal.  BS equal and clear bilateral without rales, rhonci, wheezing or stridor. Cardio: Heart sounds are normal with regular rate and rhythm and no murmurs, rubs or gallops. Peripheral pulses are normal and equal bilaterally without edema. No aortic or femoral bruits. Chest: symmetric with normal excursions and percussion. Breasts: Symmetric, without lumps, nipple discharge, retractions, or fibrocystic changes.  Abdomen: Flat, soft, with bowel sounds. Nontender, no guarding, rebound, hernias, masses, or organomegaly.  Lymphatics: Non tender without lymphadenopathy.  Musculoskeletal: Full ROM all peripheral extremities, joint stability, 5/5 strength, and normal gait. Skin: Warm and dry without rashes, lesions, cyanosis, clubbing or  ecchymosis.  Neuro: Cranial nerves intact, reflexes equal bilaterally.  Normal muscle tone, no cerebellar symptoms. Sensation intact.  Pysch: Alert and oriented X 3, normal affect, Insight and Judgment appropriate.   Assessment and Plan  1. Annual Preventative Screening Examination  - Microalbumin / creatinine urine ratio - [place], RETROPERITNL ABD,  LTD - POC Hemoccult Bld/Stl  - Urinalysis, Routine w reflex microscopic  - CBC with Differential/Platelet - BASIC METABOLIC PANEL WITH GFR - Hepatic function panel - Magnesium - Lipid panel - TSH - Hemoglobin A1c - Insulin, random - VITAMIN D 25 Hydroxy   2. Essential hypertension  - Microalbumin / creatinine urine ratio - [place],  RETROPERITNL ABD,  LTD - TSH  3. Hyperlipidemia  - Lipid panel - TSH  4. Other abnormal glucose  - Hemoglobin A1c - Insulin, random  5. Vitamin D deficiency  - VITAMIN D 25 Hydroxy  6. Chronic obstructive pulmonary disease ([place])   7. Screening for rectal cancer  - POC Hemoccult Bld/Stl   8. Medication management  - Urinalysis, Routine w reflex microscopic  - CBC with Differential/Platelet - BASIC METABOLIC PANEL WITH GFR - Hepatic function panel - Magnesium  9. Screening for AAA (aortic abdominal aneurysm)   Continue prudent diet as discussed, weight control, BP monitoring, regular exercise, and medications. Discussed med's effects and SE's. Screening labs and tests as requested with regular follow-up as recommended. Over 40 minutes of exam, counseling, chart review and high complex critical decision making was performed.

## 2015-06-05 NOTE — Patient Instructions (Signed)
Recommend Adult Low Dose Aspirin or   coated  Aspirin 81 mg daily   To reduce risk of Colon Cancer 20 %,   Skin Cancer 26 % ,   Melanoma 46%   and   Pancreatic cancer 60%   ++++++++++++++++++++++++++++++++++++++++++++++++++++++ Vitamin D goal   is between 70-100.   Please make sure that you are taking your Vitamin D as directed.   It is very important as a natural anti-inflammatory   helping hair, skin, and nails, as well as reducing stroke and heart attack risk.   It helps your bones and helps with mood.  It also decreases numerous cancer risks so please take it as directed.   Low Vit D is associated with a 200-300% higher risk for CANCER   and 200-300% higher risk for HEART   ATTACK  &  STROKE.   .....................................Marland Kitchen  It is also associated with higher death rate at younger ages,   autoimmune diseases like Rheumatoid arthritis, Lupus, Multiple Sclerosis.     Also many other serious conditions, like depression, Alzheimer's  Dementia, infertility, muscle aches, fatigue, fibromyalgia - just to name a few.  ++++++++++++++++++++++++++++++++++++++++++++++++  Recommend the book "The END of DIETING" by Dr Excell Seltzer   & the book "The END of DIABETES " by Dr Excell Seltzer  At [email or web address] - get book & Audio CD's     Being diabetic has a  300% increased risk for heart attack, stroke, cancer, and alzheimer- type vascular dementia. It is very important that you work harder with diet by avoiding all foods that are white. Avoid white rice (brown & wild rice is OK), white potatoes (sweetpotatoes in moderation is OK), White bread or wheat bread or anything made out of white flour like bagels, donuts, rolls, buns, biscuits, cakes, pastries, cookies, pizza crust, and pasta (made from white flour & egg whites) - vegetarian pasta or spinach or wheat pasta is OK. Multigrain breads like Arnold's or Pepper[employer], or multigrain sandwich thins or flatbreads.  Diet,  exercise and weight loss can reverse and cure diabetes in the early stages.  Diet, exercise and weight loss is very important in the control and prevention of complications of diabetes which affects every system in your body, ie. Brain - dementia/stroke, eyes - glaucoma/blindness, heart - heart attack/heart failure, kidneys - dialysis, stomach - gastric paralysis, intestines - malabsorption, nerves - severe painful neuritis, circulation - gangrene & loss of a leg(s), and finally cancer and Alzheimers.    I recommend avoid fried & greasy foods,  sweets/candy, white rice (brown or wild rice or Quinoa is OK), white potatoes (sweet potatoes are OK) - anything made from white flour - bagels, doughnuts, rolls, buns, biscuits,white and wheat breads, pizza crust and traditional pasta made of white flour & egg white(vegetarian pasta or spinach or wheat pasta is OK).  Multi-grain bread is OK - like multi-grain flat bread or sandwich thins. Avoid alcohol in excess. Exercise is also important.    Eat all the vegetables you want - avoid meat, especially red meat and dairy - especially cheese.  Cheese is the most concentrated form of trans-fats which is the worst thing to clog up our arteries. Veggie cheese is OK which can be found in the fresh produce section at [employer] or [employer] or [employer]  ++++++++++++++++++++++++++++++++++++++++++++++++++ DASH Eating Plan  DASH stands for "Dietary Approaches to Stop Hypertension."   The DASH eating plan is a healthy eating plan that has been shown to reduce high blood  pressure (hypertension). Additional health benefits may include reducing the risk of type 2 diabetes mellitus, heart disease, and stroke. The DASH eating plan may also help with weight loss.  WHAT DO I NEED TO KNOW ABOUT THE DASH EATING PLAN?  For the DASH eating plan, you will follow these general guidelines:  Choose foods with a percent daily value for sodium of less than 5% (as listed on the food  label).  Use salt-free seasonings or herbs instead of table salt or sea salt.  Check with your health care provider or pharmacist before using salt substitutes.  Eat lower-sodium products, often labeled as "lower sodium" or "no salt added."  Eat fresh foods.  Eat more vegetables, fruits, and low-fat dairy products.    Choose whole grains. Look for the word "whole" as the first word in the ingredient list.  Choose fish   Limit sweets, desserts, sugars, and sugary drinks.  Choose heart-healthy fats.  Eat veggie cheese   Eat more home-cooked food and less restaurant, buffet, and fast food.  Limit fried foods.  [place]uffaker foods using methods other than frying.  Limit canned vegetables. If you do use them, rinse them well to decrease the sodium.  When eating at a restaurant, ask that your food be prepared with less salt, or no salt if possible.                      WHAT FOODS CAN I EAT?  Read Dr Fara Olden Fuhrman's books on The End of Dieting & The End of Diabetes  Grains  Whole grain or whole wheat bread. Brown rice. Whole grain or whole wheat pasta. Quinoa, bulgur, and whole grain cereals. Low-sodium cereals. Corn or whole wheat flour tortillas. Whole grain cornbread. Whole grain crackers. Low-sodium crackers.  Vegetables  Fresh or frozen vegetables (raw, steamed, roasted, or grilled). Low-sodium or reduced-sodium tomato and vegetable juices. Low-sodium or reduced-sodium tomato sauce and paste. Low-sodium or reduced-sodium canned vegetables.   Fruits  All fresh, canned (in natural juice), or frozen fruits.  Protein Products   All fish and seafood.  Dried beans, peas, or lentils. Unsalted nuts and seeds. Unsalted canned beans.  Dairy  Low-fat dairy products, such as skim or 1% milk, 2% or reduced-fat cheeses, low-fat ricotta or cottage cheese, or plain low-fat yogurt. Low-sodium or reduced-sodium cheeses.  Fats and Oils  Tub margarines without trans fats. Light or  reduced-fat mayonnaise and salad dressings (reduced sodium). Avocado. Safflower, olive, or canola oils. Natural peanut or almond butter.  Other  Unsalted popcorn and pretzels. The items listed above may not be a complete list of recommended foods or beverages. Contact your dietitian for more options.  +++++++++++++++++++++++++++++++++++++++++++  WHAT FOODS ARE NOT RECOMMENDED?  Grains/ White flour or wheat flour  White bread. White pasta. White rice. Refined cornbread. Bagels and croissants. Crackers that contain trans fat.  Vegetables  Creamed or fried vegetables. Vegetables in a . Regular canned vegetables. Regular canned tomato sauce and paste. Regular tomato and vegetable juices.  Fruits  Dried fruits. Canned fruit in light or heavy syrup. Fruit juice.  Meat and Other Protein Products  Meat in general - RED mwaet & White meat.  Fatty cuts of meat. Ribs, chicken wings, bacon, sausage, bologna, salami, chitterlings, fatback, hot dogs, bratwurst, and packaged luncheon meats.  Dairy  Whole or 2% milk, cream, half-and-half, and cream cheese. Whole-fat or sweetened yogurt. Full-fat cheeses or blue cheese. Nondairy creamers and whipped toppings. Processed cheese, cheese spreads, or  cheese curds.  Condiments  Onion and garlic salt, seasoned salt, table salt, and sea salt. Canned and packaged gravies. Worcestershire sauce. Tartar sauce. Barbecue sauce. Teriyaki sauce. Soy sauce, including reduced sodium. Steak sauce. Fish sauce. Oyster sauce. Cocktail sauce. Horseradish. Ketchup and mustard. Meat flavorings and tenderizers. Bouillon cubes. Hot sauce. Tabasco sauce. Marinades. Taco seasonings. Relishes.  Fats and Oils Butter, stick margarine, lard, shortening and bacon fat. Coconut, palm kernel, or palm oils. Regular salad dressings.  Pickles and olives. Salted popcorn and pretzels.  The items listed above may not be a complete list of foods and beverages to avoid.   Preventive  Care for Adults  A healthy lifestyle and preventive care can promote health and wellness. Preventive health guidelines for women include the following key practices.  A routine yearly physical is a good way to check with your health care provider about your health and preventive screening. It is a chance to share any concerns and updates on your health and to receive a thorough exam.  Visit your dentist for a routine exam and preventive care every 6 months. Brush your teeth twice a day and floss once a day. Good oral hygiene prevents tooth decay and gum disease.  The frequency of eye exams is based on your age, health, family medical history, use of contact lenses, and other factors. Follow your health care provider's recommendations for frequency of eye exams.  Eat a healthy diet. Foods like vegetables, fruits, whole grains, low-fat dairy products, and lean protein foods contain the nutrients you need without too many calories. Decrease your intake of foods high in solid fats, added sugars, and salt. Eat the right amount of calories for you.Get information about a proper diet from your health care provider, if necessary.  Regular physical exercise is one of the most important things you can do for your health. Most adults should get at least 150 minutes of moderate-intensity exercise (any activity that increases your heart rate and causes you to sweat) each week. In addition, most adults need muscle-strengthening exercises on 2 or more days a week.  Maintain a healthy weight. The body mass index (BMI) is a screening tool to identify possible weight problems. It provides an estimate of body fat based on height and weight. Your health care provider can find your BMI and can help you achieve or maintain a healthy weight.For adults 20 years and older:  A BMI below 18.5 is considered underweight.  A BMI of 18.5 to 24.9 is normal.  A BMI of 25 to 29.9 is considered overweight.  A BMI of 30 and  above is considered obese.  Maintain normal blood lipids and cholesterol levels by exercising and minimizing your intake of saturated fat. Eat a balanced diet with plenty of fruit and vegetables. Blood tests for lipids and cholesterol should begin at age 21 and be repeated every 5 years. If your lipid or cholesterol levels are high, you are over 50, or you are at high risk for heart disease, you may need your cholesterol levels checked more frequently.Ongoing high lipid and cholesterol levels should be treated with medicines if diet and exercise are not working.  If you smoke, find out from your health care provider how to quit. If you do not use tobacco, do not start.  Lung cancer screening is recommended for adults aged 31-80 years who are at high risk for developing lung cancer because of a history of smoking. A yearly low-dose CT scan of the lungs is recommended  for people who have at least a 30-pack-year history of smoking and are a current smoker or have quit within the past 15 years. A pack year of smoking is smoking an average of 1 pack of cigarettes a day for 1 year (for example: 1 pack a day for 30 years or 2 packs a day for 15 years). Yearly screening should continue until the smoker has stopped smoking for at least 15 years. Yearly screening should be stopped for people who develop a health problem that would prevent them from having lung cancer treatment.  High blood pressure causes heart disease and increases the risk of stroke. Your blood pressure should be checked at least every 1 to 2 years. Ongoing high blood pressure should be treated with medicines if weight loss and exercise do not work.  If you are 30-80 years old, ask your health care provider if you should take aspirin to prevent strokes.  Diabetes screening involves taking a blood sample to check your fasting blood sugar level. This should be done once every 3 years, after age 61, if you are within normal weight and without risk  factors for diabetes. Testing should be considered at a younger age or be carried out more frequently if you are overweight and have at least 1 risk factor for diabetes.  Breast cancer screening is essential preventive care for women. You should practice "breast self-awareness." This means understanding the normal appearance and feel of your breasts and may include breast self-examination. Any changes detected, no matter how small, should be reported to a health care provider. Women in their 77s and 30s should have a clinical breast exam (CBE) by a health care provider as part of a regular health exam every 1 to 3 years. After age 81, women should have a CBE every year. Starting at age 20, women should consider having a mammogram (breast X-ray test) every year. Women who have a family history of breast cancer should talk to their health care provider about genetic screening. Women at a high risk of breast cancer should talk to their health care providers about having an MRI and a mammogram every year.  Breast cancer gene (BRCA)-related cancer risk assessment is recommended for women who have family members with BRCA-related cancers. BRCA-related cancers include breast, ovarian, tubal, and peritoneal cancers. Having family members with these cancers may be associated with an increased risk for harmful changes (mutations) in the breast cancer genes BRCA1 and BRCA2. Results of the assessment will determine the need for genetic counseling and BRCA1 and BRCA2 testing.  Routine pelvic exams to screen for cancer are no longer recommended for nonpregnant women who are considered low risk for cancer of the pelvic organs (ovaries, uterus, and vagina) and who do not have symptoms. Ask your health care provider if a screening pelvic exam is right for you.  If you have had past treatment for cervical cancer or a condition that could lead to cancer, you need Pap tests and screening for cancer for at least 20 years after  your treatment. If Pap tests have been discontinued, your risk factors (such as having a new sexual partner) need to be reassessed to determine if screening should be resumed. Some women have medical problems that increase the chance of getting cervical cancer. In these cases, your health care provider may recommend more frequent screening and Pap tests.  Colorectal cancer can be detected and often prevented. Most routine colorectal cancer screening begins at the age of 22 years and  continues through age 74 years. However, your health care provider may recommend screening at an earlier age if you have risk factors for colon cancer. On a yearly basis, your health care provider may provide home test kits to check for hidden blood in the stool. Use of a small camera at the end of a tube, to directly examine the colon (sigmoidoscopy or colonoscopy), can detect the earliest forms of colorectal cancer. Talk to your health care provider about this at age 22, when routine screening begins. Direct exam of the colon should be repeated every 5-10 years through age 76 years, unless early forms of pre-cancerous polyps or small growths are found.  Hepatitis C blood testing is recommended for all people born from [birth year] through 1966 and any individual with known risks for hepatitis C.  Pra  Osteoporosis is a disease in which the bones lose minerals and strength with aging. This can result in serious bone fractures or breaks. The risk of osteoporosis can be identified using a bone density scan. Women ages 88 years and over and women at risk for fractures or osteoporosis should discuss screening with their health care providers. Ask your health care provider whether you should take a calcium supplement or vitamin D to reduce the rate of osteoporosis.  Menopause can be associated with physical symptoms and risks. Hormone replacement therapy is available to decrease symptoms and risks. You should talk to your health care  provider about whether hormone replacement therapy is right for you.  Use sunscreen. Apply sunscreen liberally and repeatedly throughout the day. You should seek shade when your shadow is shorter than you. Protect yourself by wearing long sleeves, pants, a wide-brimmed hat, and sunglasses year round, whenever you are outdoors.  Once a month, do a whole body skin exam, using a mirror to look at the skin on your back. Tell your health care provider of new moles, moles that have irregular borders, moles that are larger than a pencil eraser, or moles that have changed in shape or color.  Stay current with required vaccines (immunizations).  Influenza vaccine. All adults should be immunized every year.  Tetanus, diphtheria, and acellular pertussis (Td, Tdap) vaccine. Pregnant women should receive 1 dose of Tdap vaccine during each pregnancy. The dose should be obtained regardless of the length of time since the last dose. Immunization is preferred during the 27th-36th week of gestation. An adult who has not previously received Tdap or who does not know her vaccine status should receive 1 dose of Tdap. This initial dose should be followed by tetanus and diphtheria toxoids (Td) booster doses every 10 years. Adults with an unknown or incomplete history of completing a 3-dose immunization series with Td-containing vaccines should begin or complete a primary immunization series including a Tdap dose. Adults should receive a Td booster every 10 years.  Varicella vaccine. An adult without evidence of immunity to varicella should receive 2 doses or a second dose if she has previously received 1 dose. Pregnant females who do not have evidence of immunity should receive the first dose after pregnancy. This first dose should be obtained before leaving the health care facility. The second dose should be obtained 4-8 weeks after the first dose.  Human papillomavirus (HPV) vaccine. Females aged 13-26 years who have not  received the vaccine previously should obtain the 3-dose series. The vaccine is not recommended for use in pregnant females. However, pregnancy testing is not needed before receiving a dose. If a female is found to  be pregnant after receiving a dose, no treatment is needed. In that case, the remaining doses should be delayed until after the pregnancy. Immunization is recommended for any person with an immunocompromised condition through the age of 14 years if she did not get any or all doses earlier. During the 3-dose series, the second dose should be obtained 4-8 weeks after the first dose. The third dose should be obtained 24 weeks after the first dose and 16 weeks after the second dose.  Zoster vaccine. One dose is recommended for adults aged 87 years or older unless certain conditions are present.  Measles, mumps, and rubella (MMR) vaccine. Adults born before [birth year] generally are considered immune to measles and mumps. Adults born in [birth year] or later should have 1 or more doses of MMR vaccine unless there is a contraindication to the vaccine or there is laboratory evidence of immunity to each of the three diseases. A routine second dose of MMR vaccine should be obtained at least 28 days after the first dose for students attending postsecondary schools, health care workers, or international travelers. People who received inactivated measles vaccine or an unknown type of measles vaccine during 1963-1967 should receive 2 doses of MMR vaccine. People who received inactivated mumps vaccine or an unknown type of mumps vaccine before 1979 and are at high risk for mumps infection should consider immunization with 2 doses of MMR vaccine. For females of childbearing age, rubella immunity should be determined. If there is no evidence of immunity, females who are not pregnant should be vaccinated. If there is no evidence of immunity, females who are pregnant should delay immunization until after pregnancy. Unvaccinated  health care workers born before [birth year] who lack laboratory evidence of measles, mumps, or rubella immunity or laboratory confirmation of disease should consider measles and mumps immunization with 2 doses of MMR vaccine or rubella immunization with 1 dose of MMR vaccine.  Pneumococcal 13-valent conjugate (PCV13) vaccine. When indicated, a person who is uncertain of her immunization history and has no record of immunization should receive the PCV13 vaccine. An adult aged 48 years or older who has certain medical conditions and has not been previously immunized should receive 1 dose of PCV13 vaccine. This PCV13 should be followed with a dose of pneumococcal polysaccharide (PPSV23) vaccine. The PPSV23 vaccine dose should be obtained at least 8 weeks after the dose of PCV13 vaccine. An adult aged 40 years or older who has certain medical conditions and previously received 1 or more doses of PPSV23 vaccine should receive 1 dose of PCV13. The PCV13 vaccine dose should be obtained 1 or more years after the last PPSV23 vaccine dose.    Pneumococcal polysaccharide (PPSV23) vaccine. When PCV13 is also indicated, PCV13 should be obtained first. All adults aged 66 years and older should be immunized. An adult younger than age 28 years who has certain medical conditions should be immunized. Any person who resides in a nursing home or long-term care facility should be immunized. An adult smoker should be immunized. People with an immunocompromised condition and certain other conditions should receive both PCV13 and PPSV23 vaccines. People with human immunodeficiency virus (HIV) infection should be immunized as soon as possible after diagnosis. Immunization during chemotherapy or radiation therapy should be avoided. Routine use of PPSV23 vaccine is not recommended for American Indians, [place] Natives, or people younger than 65 years unless there are medical conditions that require PPSV23 vaccine. When indicated, people who  have unknown immunization and have no record of  immunization should receive PPSV23 vaccine. One-time revaccination 5 years after the first dose of PPSV23 is recommended for people aged 19-64 years who have chronic kidney failure, nephrotic syndrome, asplenia, or immunocompromised conditions. People who received 1-2 doses of PPSV23 before age 43 years should receive another dose of PPSV23 vaccine at age 75 years or later if at least 5 years have passed since the previous dose. Doses of PPSV23 are not needed for people immunized with PPSV23 at or after age 75 years.  Preventive Services / Frequency   Ages 38 to 43 years  Blood pressure check.  Lipid and cholesterol check.  Lung cancer screening. / Every year if you are aged 57-80 years and have a 30-pack-year history of smoking and currently smoke or have quit within the past 15 years. Yearly screening is stopped once you have quit smoking for at least 15 years or develop a health problem that would prevent you from having lung cancer treatment.  Clinical breast exam.** / Every year after age 95 years.  BRCA-related cancer risk assessment.** / For women who have family members with a BRCA-related cancer (breast, ovarian, tubal, or peritoneal cancers).  Mammogram.** / Every year beginning at age 63 years and continuing for as long as you are in good health. Consult with your health care provider.  Pap test.** / Every 3 years starting at age 87 years through age 20 or 53 years with a history of 3 consecutive normal Pap tests.  HPV screening.** / Every 3 years from ages 84 years through ages 19 to 62 years with a history of 3 consecutive normal Pap tests.  Fecal occult blood test (FOBT) of stool. / Every year beginning at age 50 years and continuing until age 59 years. You may not need to do this test if you get a colonoscopy every 10 years.  Flexible sigmoidoscopy or colonoscopy.** / Every 5 years for a flexible sigmoidoscopy or every 10 years  for a colonoscopy beginning at age 73 years and continuing until age 77 years.  Hepatitis C blood test.** / For all people born from [birth year] through 1966 and any individual with known risks for hepatitis C.  Skin self-exam. / Monthly.  Influenza vaccine. / Every year.  Tetanus, diphtheria, and acellular pertussis (Tdap/Td) vaccine.** / Consult your health care provider. Pregnant women should receive 1 dose of Tdap vaccine during each pregnancy. 1 dose of Td every 10 years.  Varicella vaccine.** / Consult your health care provider. Pregnant females who do not have evidence of immunity should receive the first dose after pregnancy.  Zoster vaccine.** / 1 dose for adults aged 32 years or older.  Pneumococcal 13-valent conjugate (PCV13) vaccine.** / Consult your health care provider.  Pneumococcal polysaccharide (PPSV23) vaccine.** / 1 to 2 doses if you smoke cigarettes or if you have certain conditions.  Meningococcal vaccine.** / Consult your health care provider.  Hepatitis A vaccine.** / Consult your health care provider.  Hepatitis B vaccine.** / Consult your health care provider. Screening for abdominal aortic aneurysm (AAA)  by ultrasound is recommended for people over 50 who have history of high blood pressure or who are current or former smokers.

## 2015-06-06 ENCOUNTER — Other Ambulatory Visit: Payer: Medicare Other

## 2015-06-06 LAB — CBC WITH DIFFERENTIAL/PLATELET
BASOS ABS: 0 {cells}/uL (ref 0–200)
Basophils Relative: 0 %
EOS ABS: 81 {cells}/uL (ref 15–500)
Eosinophils Relative: 1 %
HCT: 43.7 % (ref 35.0–45.0)
HEMOGLOBIN: 14.4 g/dL (ref 11.7–15.5)
LYMPHS ABS: 1944 {cells}/uL (ref 850–3900)
Lymphocytes Relative: 24 %
MCH: 30.4 pg (ref 27.0–33.0)
MCHC: 33 g/dL (ref 32.0–36.0)
MCV: 92.4 fL (ref 80.0–100.0)
MONOS PCT: 5 %
MPV: 10.3 fL (ref 7.5–12.5)
Monocytes Absolute: 405 cells/uL (ref 200–950)
NEUTROS ABS: 5670 {cells}/uL (ref 1500–7800)
Neutrophils Relative %: 70 %
Platelets: 314 10*3/uL (ref 140–400)
RBC: 4.73 MIL/uL (ref 3.80–5.10)
RDW: 13.3 % (ref 11.0–15.0)
WBC: 8.1 10*3/uL (ref 3.8–10.8)

## 2015-06-06 LAB — HEPATIC FUNCTION PANEL
ALBUMIN: 4.5 g/dL (ref 3.6–5.1)
ALT: 17 U/L (ref 6–29)
AST: 20 U/L (ref 10–35)
Alkaline Phosphatase: 100 U/L (ref 33–130)
Bilirubin, Direct: 0.1 mg/dL (ref <=0.2)
Indirect Bilirubin: 0.5 mg/dL (ref 0.2–1.2)
Total Bilirubin: 0.6 mg/dL (ref 0.2–1.2)
Total Protein: 7.3 g/dL (ref 6.1–8.1)

## 2015-06-06 LAB — LIPID PANEL
Cholesterol: 179 mg/dL (ref 125–200)
HDL: 62 mg/dL (ref >=46)
LDL Cholesterol: 87 mg/dL (ref <130)
Total CHOL/HDL Ratio: 2.9 Ratio (ref <=5.0)
Triglycerides: 151 mg/dL — ABNORMAL HIGH (ref <150)
VLDL: 30 mg/dL (ref <30)

## 2015-06-06 LAB — BASIC METABOLIC PANEL WITH GFR
BUN: 10 mg/dL (ref 7–25)
CHLORIDE: 103 mmol/L (ref 98–110)
CO2: 22 mmol/L (ref 20–31)
CREATININE: 0.87 mg/dL (ref 0.50–0.99)
Calcium: 9.9 mg/dL (ref 8.6–10.4)
GFR, Est African American: 84 mL/min (ref >=60)
GFR, Est Non African American: 73 mL/min (ref >=60)
GLUCOSE: 112 mg/dL — AB (ref 65–99)
Potassium: 3.7 mmol/L (ref 3.5–5.3)
Sodium: 136 mmol/L (ref 135–146)

## 2015-06-06 LAB — MAGNESIUM: MAGNESIUM: 2 mg/dL (ref 1.5–2.5)

## 2015-06-06 LAB — TSH: TSH: 0.27 m[IU]/L — AB

## 2015-06-06 LAB — HEMOGLOBIN A1C
HEMOGLOBIN A1C: 5.6 % (ref <5.7)
Mean Plasma Glucose: 114 mg/dL

## 2015-06-07 ENCOUNTER — Other Ambulatory Visit: Payer: Self-pay | Admitting: Internal Medicine

## 2015-06-07 ENCOUNTER — Encounter: Payer: Self-pay | Admitting: *Deleted

## 2015-06-07 DIAGNOSIS — E0590 Thyrotoxicosis, unspecified without thyrotoxic crisis or storm: Secondary | ICD-10-CM

## 2015-06-07 LAB — VITAMIN D 25 HYDROXY (VIT D DEFICIENCY, FRACTURES): VIT D 25 HYDROXY: 51 ng/mL (ref 30–100)

## 2015-06-09 LAB — MICROALBUMIN / CREATININE URINE RATIO

## 2015-06-09 LAB — URINALYSIS, ROUTINE W REFLEX MICROSCOPIC

## 2015-06-09 LAB — INSULIN, RANDOM: INSULIN: 19.6 u[IU]/mL (ref 2.0–19.6)

## 2015-06-10 ENCOUNTER — Other Ambulatory Visit: Payer: Self-pay | Admitting: Internal Medicine

## 2015-06-11 LAB — URINALYSIS, ROUTINE W REFLEX MICROSCOPIC
Bilirubin Urine: NEGATIVE
GLUCOSE, UA: NEGATIVE
HGB URINE DIPSTICK: NEGATIVE
Ketones, ur: NEGATIVE
LEUKOCYTES UA: NEGATIVE
Nitrite: NEGATIVE
PH: 6.5 (ref 5.0–8.0)
Protein, ur: NEGATIVE
SPECIFIC GRAVITY, URINE: 1.015 (ref 1.001–1.035)

## 2015-06-11 LAB — MICROALBUMIN / CREATININE URINE RATIO
CREATININE, URINE: 140 mg/dL (ref 20–320)
MICROALB UR: 0.6 mg/dL
Microalb Creat Ratio: 4 mcg/mg creat (ref <30)

## 2015-07-06 ENCOUNTER — Other Ambulatory Visit: Payer: Self-pay | Admitting: Internal Medicine

## 2015-07-21 ENCOUNTER — Telehealth: Payer: Self-pay | Admitting: *Deleted

## 2015-07-21 NOTE — Telephone Encounter (Signed)
Pt aware of lab results 

## 2015-07-24 ENCOUNTER — Ambulatory Visit (INDEPENDENT_AMBULATORY_CARE_PROVIDER_SITE_OTHER): Payer: Medicare Other | Admitting: *Deleted

## 2015-07-24 DIAGNOSIS — E0590 Thyrotoxicosis, unspecified without thyrotoxic crisis or storm: Secondary | ICD-10-CM

## 2015-07-24 LAB — TSH: TSH: 0.69 mIU/L

## 2015-07-24 NOTE — Progress Notes (Signed)
Patient ID: Amber Hickman, female   DOB: 1955/03/02, 60 y.o.   MRN: [id number] Patient presents for recheck TSH level per Dr. Idell Pickles orders.

## 2015-09-04 ENCOUNTER — Encounter: Payer: Self-pay | Admitting: Internal Medicine

## 2015-09-04 ENCOUNTER — Ambulatory Visit (INDEPENDENT_AMBULATORY_CARE_PROVIDER_SITE_OTHER): Payer: Medicare Other | Admitting: Internal Medicine

## 2015-09-04 VITALS — BP 114/76 | HR 86 | Temp 98.2°F | Resp 16 | Ht 64.0 in | Wt 140.0 lb

## 2015-09-04 DIAGNOSIS — I10 Essential (primary) hypertension: Secondary | ICD-10-CM

## 2015-09-04 DIAGNOSIS — F17200 Nicotine dependence, unspecified, uncomplicated: Secondary | ICD-10-CM | POA: Diagnosis not present

## 2015-09-04 DIAGNOSIS — R7309 Other abnormal glucose: Secondary | ICD-10-CM | POA: Diagnosis not present

## 2015-09-04 DIAGNOSIS — Z79899 Other long term (current) drug therapy: Secondary | ICD-10-CM | POA: Diagnosis not present

## 2015-09-04 DIAGNOSIS — E785 Hyperlipidemia, unspecified: Secondary | ICD-10-CM

## 2015-09-04 DIAGNOSIS — E559 Vitamin D deficiency, unspecified: Secondary | ICD-10-CM

## 2015-09-04 DIAGNOSIS — G894 Chronic pain syndrome: Secondary | ICD-10-CM

## 2015-09-04 LAB — CBC WITH DIFFERENTIAL/PLATELET
BASOS PCT: 0 %
Basophils Absolute: 0 cells/uL (ref 0–200)
EOS PCT: 1 %
Eosinophils Absolute: 56 cells/uL (ref 15–500)
HCT: 41 % (ref 35.0–45.0)
Hemoglobin: 13.4 g/dL (ref 11.7–15.5)
LYMPHS PCT: 22 %
Lymphs Abs: 1232 cells/uL (ref 850–3900)
MCH: 30.9 pg (ref 27.0–33.0)
MCHC: 32.7 g/dL (ref 32.0–36.0)
MCV: 94.5 fL (ref 80.0–100.0)
MONOS PCT: 10 %
MPV: 10.1 fL (ref 7.5–12.5)
Monocytes Absolute: 560 cells/uL (ref 200–950)
NEUTROS PCT: 67 %
Neutro Abs: 3752 cells/uL (ref 1500–7800)
PLATELETS: 258 10*3/uL (ref 140–400)
RBC: 4.34 MIL/uL (ref 3.80–5.10)
RDW: 13.1 % (ref 11.0–15.0)
WBC: 5.6 10*3/uL (ref 3.8–10.8)

## 2015-09-04 LAB — TSH: TSH: 0.56 mIU/L

## 2015-09-04 NOTE — Progress Notes (Signed)
Assessment and Plan:  Hypertension:  -Continue medication -monitor blood pressure at home. -Continue DASH diet -Reminder to go to the ER if any CP, SOB, nausea, dizziness, severe HA, changes vision/speech, left arm numbness and tingling and jaw pain.  Cholesterol -cont zocor - Continue diet and exercise -Check cholesterol.   Diabetes with diabetic chronic kidney disease  -sugars well controlled with diet and exercise -Continue diet and exercise.  -Check A1C  Vitamin D Def -check level -continue medications.   Chronic pain syndrome -followed by pain management  Depression -cont prozac -referrals to counselors given -suggested she see [facility] first due to cost  Tobacco abuse -still smoking -not interested in stopping -counseling given -chantix prescription is still valid  Continue diet and meds as discussed. Further disposition pending results of labs. Discussed med's effects and SE's.    HPI 60 y.o. female  presents for 3 month follow up with hypertension, hyperlipidemia, diabetes and vitamin D deficiency.   Her blood pressure has been controlled at home, today their BP is BP: 114/76 mmHg.She does not workout. She denies chest pain, shortness of breath, dizziness.   She is on cholesterol medication and denies myalgias. Her cholesterol is at goal. The cholesterol was:  06/05/2015: Cholesterol 179; HDL 62; LDL Cholesterol 87; Triglycerides 151*   She has been working on diet and exercise for diabetes with diabetic chronic kidney disease, she is on bASA, she is on ACE/ARB, and denies  foot ulcerations, hyperglycemia, hypoglycemia , increased appetite, nausea, paresthesia of the feet, polydipsia, polyuria, visual disturbances, vomiting and weight loss. Last A1C was: 06/05/2015: Hgb A1c MFr Bld 5.6   Patient is on Vitamin D supplement. 06/05/2015: Vit D, 25-Hydroxy 51  She did recently strain her back. She lifted a large bag.  She reports that she has been taking her pain  medication and zanaflex.  She has not been using her heating pad.    She reports that she has been having a lot of issues with depression and what she thinks is PTSD.  She has been on prozac for a long time.  She is interested in seeing whether she has PTSD.  She would like to go and see a therapist but who will not break her bank account.  She is disabled.    She does still see pain management for her back pain.  They are prescribing her pain medications.    She is still smoking.  Not ready to quit yet.  She does have chantix at home.    Current Medications:  Current Outpatient Prescriptions on File Prior to Visit  Medication Sig Dispense Refill  . albuterol (PROVENTIL HFA;VENTOLIN HFA) 108 (90 BASE) MCG/ACT inhaler Inhale 2 puffs into the lungs every 6 (six) hours as needed for wheezing or shortness of breath. 1 Inhaler 2  . cholecalciferol (VITAMIN D) 1000 UNITS tablet Take 5,000 Units by mouth daily.    . clobetasol (TEMOVATE) 0.05 % external solution apply 1 APPLICATION to affected area twice a day 50 mL 0  . FLUoxetine (PROZAC) 40 MG capsule take 1 capsule by mouth twice a day 180 capsule 1  . oxyCODONE-acetaminophen (PERCOCET) 10-325 MG per tablet Take 1 tablet by mouth every 6 (six) hours as needed for pain.    . simvastatin (ZOCOR) 40 MG tablet take 1 tablet by mouth at bedtime 90 tablet 1  . tiZANidine (ZANAFLEX) 2 MG tablet Take 2 mg by mouth every 6 (six) hours as needed for muscle spasms.    Marland Kitchen topiramate (  TOPAMAX) 25 MG tablet Take 50 mg by mouth at bedtime.     . gabapentin (NEURONTIN) 300 MG capsule Take 300 mg by mouth 2 (two) times daily as needed (pain). Reported on 09/04/2015  0  . varenicline (CHANTIX CONTINUING MONTH PAK) 1 MG tablet Take 1 tablet (1 mg total) by mouth 2 (two) times daily. (Patient not taking: Reported on 09/04/2015) 56 tablet 2   No current facility-administered medications on file prior to visit.   Medical History:  Past Medical History  Diagnosis Date   . Hypertension   . Hyperlipidemia   . Depression   . GERD (gastroesophageal reflux disease)   . Arthritis   . COPD (chronic obstructive pulmonary disease) ([place])   . Vitamin D deficiency   . AVN (avascular necrosis of bone) ([facility])     Left shoulder  . Allergy   . Diabetes mellitus without complication ([facility])     Pre-diabetes.    Allergies:  Allergies  Allergen Reactions  . Augmentin [Amoxicillin-Pot Clavulanate]   . Codeine Nausea And Vomiting  . Lipitor [Atorvastatin]     Leg pain  . Wellbutrin [Bupropion]     Pt does not recall reaction   . Zoloft [Sertraline Hcl]     Pt does not recall reaction      Review of Systems:  Review of Systems  Constitutional: Negative for fever, chills and malaise/fatigue.  HENT: Negative for congestion, ear pain and sore throat.   Eyes: Negative.   Respiratory: Negative for cough, shortness of breath and wheezing.   Cardiovascular: Negative for chest pain, palpitations and leg swelling.  Gastrointestinal: Negative for heartburn, abdominal pain, diarrhea, constipation, blood in stool and melena.  Genitourinary: Negative.   Skin: Negative.   Neurological: Negative for dizziness, sensory change, loss of consciousness and headaches.  Psychiatric/Behavioral: Negative for depression. The patient is not nervous/anxious and does not have insomnia.     Family history- Review and unchanged  Social history- Review and unchanged  Physical Exam: BP 114/76 mmHg  Pulse 86  Temp(Src) 98.2 F (36.8 C) (Temporal)  Resp 16  Ht 5\' 4"  (1.626 m)  Wt 140 lb (63.504 kg)  BMI 24.02 kg/m2 Wt Readings from Last 3 Encounters:  09/04/15 140 lb (63.504 kg)  06/05/15 144 lb (65.318 kg)  05/05/15 149 lb 3.2 oz (67.677 kg)   General Appearance: Well nourished well developed, non-toxic appearing, in no apparent distress. Eyes: PERRLA, EOMs, conjunctiva no swelling or erythema ENT/Mouth: Ear canals clear with no erythema, swelling, or discharge.  TMs normal  bilaterally, oropharynx clear, moist, with no exudate.   Neck: Supple, thyroid normal, no JVD, no cervical adenopathy.  Respiratory: Respiratory effort normal, breath sounds clear A&P, no wheeze, rhonchi or rales noted.  No retractions, no accessory muscle usage Cardio: RRR with no MRGs. No noted edema.  Abdomen: Soft, + BS.  Non tender, no guarding, rebound, hernias, masses. Musculoskeletal: Full ROM, 5/5 strength, Normal gait Skin: Warm, dry without rashes, lesions, ecchymosis.  Neuro: Awake and oriented X 3, Cranial nerves intact. No cerebellar symptoms.  Psych: normal affect, Insight and Judgment appropriate.    Starlyn Skeans, PA-C 11:35 AM [facility]

## 2015-09-04 NOTE — Patient Instructions (Addendum)
Counseling services  Dr. Edward Lurey, Ph.D. [address] [place] [address] Phone: [phone]  Catharine Dowda, M.Ed [phone] [address], [place] [address]   [facility] Hours: Monday-Thursday 830-8pm  Friday 830AM-7PM Address: [address] [phone]  [facility].  Address: [address]     [place], [place] [address] Phone-[phone]  [facility] [phone] office [email or web address] [address], [place], [place] [address]  Laura Atkinson, therapist  Erik Nelson, MA, clinical psychologist  Cognitive-Behavior Therapy; Mood Disorders; Anxiety Disorders; adult and child ADHD; Family Therapy; Stress Management; personal growth, and Marital Therapy.    Dennis McKnight Ph.D., clinical psychologist Cognitive-Behavior Therapy; Mood Disorders; Anxiety Disorders; Stress     Management  [facility] [address], [place], [place] [address] [phone]   [facility] Desiree Wilkinson, psychotherapist [address] [place], [place] [address] [phone]  Elaine Talbert Ph.D., clinical psychologist [phone] office [address] [place],  [address] Cognitive Behavior Therapy, Depression, Bipolar, Anxiety, Grief and Loss       

## 2015-09-05 LAB — HEMOGLOBIN A1C
HEMOGLOBIN A1C: 5.5 % (ref <5.7)
Mean Plasma Glucose: 111 mg/dL

## 2015-09-05 LAB — HEPATIC FUNCTION PANEL
ALBUMIN: 4 g/dL (ref 3.6–5.1)
ALK PHOS: 86 U/L (ref 33–130)
ALT: 49 U/L — ABNORMAL HIGH (ref 6–29)
AST: 50 U/L — ABNORMAL HIGH (ref 10–35)
BILIRUBIN INDIRECT: 0.3 mg/dL (ref 0.2–1.2)
BILIRUBIN TOTAL: 0.4 mg/dL (ref 0.2–1.2)
Bilirubin, Direct: 0.1 mg/dL (ref <=0.2)
Total Protein: 6.4 g/dL (ref 6.1–8.1)

## 2015-09-05 LAB — BASIC METABOLIC PANEL WITH GFR
BUN: 9 mg/dL (ref 7–25)
CALCIUM: 9.3 mg/dL (ref 8.6–10.4)
CO2: 23 mmol/L (ref 20–31)
Chloride: 111 mmol/L — ABNORMAL HIGH (ref 98–110)
Creat: 0.9 mg/dL (ref 0.50–0.99)
GFR, EST AFRICAN AMERICAN: 80 mL/min (ref >=60)
GFR, EST NON AFRICAN AMERICAN: 70 mL/min (ref >=60)
Glucose, Bld: 100 mg/dL — ABNORMAL HIGH (ref 65–99)
POTASSIUM: 4.4 mmol/L (ref 3.5–5.3)
Sodium: 142 mmol/L (ref 135–146)

## 2015-09-05 LAB — LIPID PANEL
CHOL/HDL RATIO: 2.3 ratio (ref <=5.0)
Cholesterol: 138 mg/dL (ref 125–200)
HDL: 61 mg/dL (ref >=46)
LDL CALC: 61 mg/dL (ref <130)
TRIGLYCERIDES: 82 mg/dL (ref <150)
VLDL: 16 mg/dL (ref <30)

## 2015-10-09 ENCOUNTER — Other Ambulatory Visit: Payer: Medicare Other

## 2015-10-09 DIAGNOSIS — R748 Abnormal levels of other serum enzymes: Secondary | ICD-10-CM

## 2015-10-09 LAB — HEPATIC FUNCTION PANEL
ALT: 42 U/L — ABNORMAL HIGH (ref 6–29)
AST: 34 U/L (ref 10–35)
Albumin: 3.9 g/dL (ref 3.6–5.1)
Alkaline Phosphatase: 93 U/L (ref 33–130)
Bilirubin, Direct: 0.1 mg/dL
Indirect Bilirubin: 0.2 mg/dL (ref 0.2–1.2)
Total Bilirubin: 0.3 mg/dL (ref 0.2–1.2)
Total Protein: 6.5 g/dL (ref 6.1–8.1)

## 2015-12-11 ENCOUNTER — Encounter: Payer: Self-pay | Admitting: Internal Medicine

## 2015-12-11 ENCOUNTER — Ambulatory Visit (INDEPENDENT_AMBULATORY_CARE_PROVIDER_SITE_OTHER): Payer: Medicare Other | Admitting: Internal Medicine

## 2015-12-11 VITALS — BP 114/76 | HR 80 | Temp 97.8°F | Resp 16 | Ht 64.0 in | Wt 138.2 lb

## 2015-12-11 DIAGNOSIS — Z79899 Other long term (current) drug therapy: Secondary | ICD-10-CM

## 2015-12-11 DIAGNOSIS — E782 Mixed hyperlipidemia: Secondary | ICD-10-CM

## 2015-12-11 DIAGNOSIS — E559 Vitamin D deficiency, unspecified: Secondary | ICD-10-CM

## 2015-12-11 DIAGNOSIS — I10 Essential (primary) hypertension: Secondary | ICD-10-CM | POA: Diagnosis not present

## 2015-12-11 DIAGNOSIS — R7309 Other abnormal glucose: Secondary | ICD-10-CM | POA: Diagnosis not present

## 2015-12-11 DIAGNOSIS — R7303 Prediabetes: Secondary | ICD-10-CM | POA: Diagnosis not present

## 2015-12-11 LAB — BASIC METABOLIC PANEL WITH GFR
BUN: 14 mg/dL (ref 7–25)
CALCIUM: 9.6 mg/dL (ref 8.6–10.4)
CHLORIDE: 108 mmol/L (ref 98–110)
CO2: 18 mmol/L — AB (ref 20–31)
CREATININE: 0.93 mg/dL (ref 0.50–0.99)
GFR, Est African American: 77 mL/min (ref >=60)
GFR, Est Non African American: 67 mL/min (ref >=60)
GLUCOSE: 95 mg/dL (ref 65–99)
Potassium: 3.8 mmol/L (ref 3.5–5.3)
Sodium: 139 mmol/L (ref 135–146)

## 2015-12-11 LAB — HEPATIC FUNCTION PANEL
ALBUMIN: 4 g/dL (ref 3.6–5.1)
ALT: 28 U/L (ref 6–29)
AST: 26 U/L (ref 10–35)
Alkaline Phosphatase: 93 U/L (ref 33–130)
BILIRUBIN DIRECT: 0.1 mg/dL (ref <=0.2)
Indirect Bilirubin: 0.3 mg/dL (ref 0.2–1.2)
TOTAL PROTEIN: 6.8 g/dL (ref 6.1–8.1)
Total Bilirubin: 0.4 mg/dL (ref 0.2–1.2)

## 2015-12-11 LAB — HEMOGLOBIN A1C
Hgb A1c MFr Bld: 5.4 % (ref <5.7)
MEAN PLASMA GLUCOSE: 108 mg/dL

## 2015-12-11 LAB — CBC WITH DIFFERENTIAL/PLATELET
BASOS ABS: 0 {cells}/uL (ref 0–200)
Basophils Relative: 0 %
EOS ABS: 148 {cells}/uL (ref 15–500)
Eosinophils Relative: 2 %
HEMATOCRIT: 43.6 % (ref 35.0–45.0)
HEMOGLOBIN: 14.1 g/dL (ref 11.7–15.5)
LYMPHS ABS: 2590 {cells}/uL (ref 850–3900)
Lymphocytes Relative: 35 %
MCH: 31.1 pg (ref 27.0–33.0)
MCHC: 32.3 g/dL (ref 32.0–36.0)
MCV: 96.2 fL (ref 80.0–100.0)
MPV: 10.1 fL (ref 7.5–12.5)
Monocytes Absolute: 518 cells/uL (ref 200–950)
Monocytes Relative: 7 %
NEUTROS ABS: 4144 {cells}/uL (ref 1500–7800)
NEUTROS PCT: 56 %
Platelets: 337 10*3/uL (ref 140–400)
RBC: 4.53 MIL/uL (ref 3.80–5.10)
RDW: 13.6 % (ref 11.0–15.0)
WBC: 7.4 10*3/uL (ref 3.8–10.8)

## 2015-12-11 LAB — LIPID PANEL
CHOL/HDL RATIO: 3.2 ratio (ref <=5.0)
Cholesterol: 161 mg/dL (ref 125–200)
HDL: 51 mg/dL (ref >=46)
LDL Cholesterol: 79 mg/dL (ref <130)
Triglycerides: 153 mg/dL — ABNORMAL HIGH (ref <150)
VLDL: 31 mg/dL — ABNORMAL HIGH (ref <30)

## 2015-12-11 LAB — MAGNESIUM: MAGNESIUM: 1.9 mg/dL (ref 1.5–2.5)

## 2015-12-11 LAB — TSH: TSH: 1.93 m[IU]/L

## 2015-12-11 NOTE — Progress Notes (Signed)
[facility] Unk Pinto, M.D.        Uvaldo Bristle. Silverio Lay, P.A.-C       Starlyn Skeans, P.A.-C  [facility]                [address] [place] [id number] Telephone [phone] Telefax [phone] ______________________________________________________________________     This very nice 60 y.o. single WF presents for 6 month follow up with Hypertension, Hyperlipidemia, Pre-Diabetes and Vitamin D Deficiency. She also has Chronic LLumbago attributed to Lumbar DDD with surg x 2 in 2004 with chronic pain & in 2013 lost her job in a downsize at [employer] and shortly thereafter went SS Disability.     Patient was dx'd with  HTN 1998 and continued  treatment til  2010 as her BP seemed to normalize on her analgesic medications. Today's BP is  114/76. Patient has had no complaints of any cardiac type chest pain, palpitations, dyspnea/orthopnea/PND, dizziness, claudication, or dependent edema.     Hyperlipidemia is controlled with diet & meds. Patient denies myalgias or other med SE's. Last Lipids were at goal: Lab Results  Component Value Date   CHOL 138 09/04/2015   HDL 61 09/04/2015   LDLCALC 61 09/04/2015   TRIG 82 09/04/2015   CHOLHDL 2.3 09/04/2015      Also, the patient has history of PreDiabetes circa 11/2014 with A1c 5.7% and she has had no symptoms of reactive hypoglycemia, diabetic polys, paresthesias or visual blurring.  Last A1c was at goal: Lab Results  Component Value Date   HGBA1C 5.5 09/04/2015      Further, the patient also has history of Vitamin D Deficiency ("20" in 2008) and she supplements vitamin D without any suspected side-effects. Last vitamin D was  Low (goal is betw 70-100) Lab Results  Component Value Date   VD25OH 51 06/05/2015   Current Outpatient Prescriptions on File Prior to Visit  Medication Sig  . albuterol (PROVENTIL HFA;VENTOLIN HFA) 108 (90 BASE)  MCG/ACT inhaler Inhale 2 puffs into the lungs every 6 (six) hours as needed for wheezing or shortness of breath.  . cholecalciferol (VITAMIN D) 1000 UNITS tablet Take 5,000 Units by mouth daily.  . clobetasol (TEMOVATE) 0.05 % external solution apply 1 APPLICATION to affected area twice a day  . FLUoxetine (PROZAC) 40 MG capsule take 1 capsule by mouth twice a day  . gabapentin (NEURONTIN) 300 MG capsule Take 300 mg by mouth 2 (two) times daily as needed (pain). Reported on 09/04/2015  . oxyCODONE-acetaminophen (PERCOCET) 10-325 MG per tablet Take 1 tablet by mouth every 6 (six) hours as needed for pain.  . simvastatin (ZOCOR) 40 MG tablet take 1 tablet by mouth at bedtime  . tiZANidine (ZANAFLEX) 2 MG tablet Take 2 mg by mouth every 6 (six) hours as needed for muscle spasms.  Marland Kitchen topiramate (TOPAMAX) 25 MG tablet Take 50 mg by mouth at bedtime.   . varenicline (CHANTIX CONTINUING MONTH PAK) 1 MG tablet Take 1 tablet (1 mg total) by mouth 2 (two) times daily. (Patient not taking: Reported on 09/04/2015)   No current facility-administered medications on file prior to visit.    Allergies  Allergen Reactions  . Augmentin [Amoxicillin-Pot Clavulanate]   . Codeine Nausea And Vomiting  . Lipitor [Atorvastatin]     Leg pain  . Wellbutrin [Bupropion]     Pt  does not recall reaction   . Zoloft [Sertraline Hcl]     Pt does not recall reaction    PMHx:   Past Medical History:  Diagnosis Date  . Allergy   . Arthritis   . AVN (avascular necrosis of bone) ([facility])    Left shoulder  . COPD (chronic obstructive pulmonary disease) ([place])   . Depression   . Diabetes mellitus without complication ([facility])    Pre-diabetes.   Marland Kitchen GERD (gastroesophageal reflux disease)   . Hyperlipidemia   . Hypertension   . Vitamin D deficiency    Immunization History  Administered Date(s) Administered  . Td 01/19/2004, 06/05/2015   Past Surgical History:  Procedure Laterality Date  . ANKLE SURGERY Right   . CATARACT  EXTRACTION Bilateral   . SPINE SURGERY     lumbar  . TONSILLECTOMY AND ADENOIDECTOMY     FHx:    Reviewed / unchanged  SHx:    Reviewed / unchanged  Systems Review:  Constitutional: Denies fever, chills, wt changes, headaches, insomnia, fatigue, night sweats, change in appetite. Eyes: Denies redness, blurred vision, diplopia, discharge, itchy, watery eyes.  ENT: Denies discharge, congestion, post nasal drip, epistaxis, sore throat, earache, hearing loss, dental pain, tinnitus, vertigo, sinus pain, snoring.  CV: Denies chest pain, palpitations, irregular heartbeat, syncope, dyspnea, diaphoresis, orthopnea, PND, claudication or edema. Respiratory: denies cough, dyspnea, DOE, pleurisy, hoarseness, laryngitis, wheezing.  Gastrointestinal: Denies dysphagia, odynophagia, heartburn, reflux, water brash, abdominal pain or cramps, nausea, vomiting, bloating, diarrhea, constipation, hematemesis, melena, hematochezia  or hemorrhoids. Genitourinary: Denies dysuria, frequency, urgency, nocturia, hesitancy, discharge, hematuria or flank pain. Musculoskeletal: Denies arthralgias, myalgias, stiffness, jt. swelling, pain, limping or strain/sprain.  Skin: Denies pruritus, rash, hives, warts, acne, eczema or change in skin lesion(s). Neuro: No weakness, tremor, incoordination, spasms, paresthesia or pain. Psychiatric: Denies confusion, memory loss or sensory loss. Endo: Denies change in weight, skin or hair change.  Heme/Lymph: No excessive bleeding, bruising or enlarged lymph nodes.  Physical Exam BP 114/76   Pulse 80   Temp 97.8 F (36.6 C)   Resp 16   Ht 5\' 4"  (1.626 m)   Wt 138 lb 3.2 oz (62.7 kg)   BMI 23.72 kg/m   Appears well nourished and in no distress.  Eyes: PERRLA, EOMs, conjunctiva no swelling or erythema. Sinuses: No frontal/maxillary tenderness ENT/Mouth: EAC's clear, TM's nl w/o erythema, bulging. Nares clear w/o erythema, swelling, exudates. Oropharynx clear without erythema or  exudates. Oral hygiene is good. Tongue normal, non obstructing. Hearing intact.  Neck: Supple. Thyroid nl. Car 2+/2+ without bruits, nodes or JVD. Chest: Respirations nl with BS clear & equal w/o rales, rhonchi, wheezing or stridor.  Cor: Heart sounds normal w/ regular rate and rhythm without sig. murmurs, gallops, clicks, or rubs. Peripheral pulses normal and equal  without edema.  Abdomen: Soft & bowel sounds normal. Non-tender w/o guarding, rebound, hernias, masses, or organomegaly.  Lymphatics: Unremarkable.  Musculoskeletal: Full ROM all peripheral extremities, joint stability, 5/5 strength, and normal gait.  Skin: Warm, dry without exposed rashes, lesions or ecchymosis apparent.  Neuro: Cranial nerves intact, reflexes equal bilaterally. Sensory-motor testing grossly intact. Tendon reflexes grossly intact.  Pysch: Alert & oriented x 3.  Insight and judgement nl & appropriate. No ideations.  Assessment and Plan:  1. Essential hypertension  - Continue medication, monitor blood pressure at home. Continue DASH diet.  - Reminder to go to the ER if any CP, SOB, nausea, dizziness, severe HA, changes vision/speech, left arm numbness and tingling  and jaw pain. - TSH  2. Mixed hyperlipidemia  - Continue diet/meds, exercise,& lifestyle modifications.  - Continue monitor periodic cholesterol/liver & renal functions  - Lipid panel - TSH  3. Prediabetes  - Continue diet, exercise, lifestyle modifications. Monitor appropriate labs. - Hemoglobin A1c - Insulin, random  4. Vitamin D deficiency  - Continue supplementation. - VITAMIN D 25 Hydroxy   5. Other abnormal glucose   6. Medication management  - CBC with Differential/Platelet - BASIC METABOLIC PANEL WITH GFR - Hepatic function panel - Magnesium      Recommended regular exercise, BP monitoring, weight control, and discussed med and SE's. Recommended labs to assess and monitor clinical status. Further disposition pending results  of labs. Over 30 minutes of exam, counseling, chart review was performed

## 2015-12-11 NOTE — Patient Instructions (Signed)

## 2015-12-12 LAB — INSULIN, RANDOM: INSULIN: 17.9 u[IU]/mL (ref 2.0–19.6)

## 2015-12-12 LAB — VITAMIN D 25 HYDROXY (VIT D DEFICIENCY, FRACTURES): Vit D, 25-Hydroxy: 43 ng/mL (ref 30–100)

## 2016-01-26 ENCOUNTER — Other Ambulatory Visit: Payer: Self-pay | Admitting: Internal Medicine

## 2016-02-02 ENCOUNTER — Encounter: Payer: Self-pay | Admitting: Internal Medicine

## 2016-02-02 ENCOUNTER — Ambulatory Visit (INDEPENDENT_AMBULATORY_CARE_PROVIDER_SITE_OTHER): Payer: Medicare Other | Admitting: Internal Medicine

## 2016-02-02 VITALS — BP 116/64 | HR 60 | Temp 98.0°F | Resp 16 | Ht 64.0 in | Wt 140.0 lb

## 2016-02-02 DIAGNOSIS — Z0001 Encounter for general adult medical examination with abnormal findings: Secondary | ICD-10-CM | POA: Diagnosis not present

## 2016-02-02 DIAGNOSIS — R7309 Other abnormal glucose: Secondary | ICD-10-CM

## 2016-02-02 DIAGNOSIS — N182 Chronic kidney disease, stage 2 (mild): Secondary | ICD-10-CM

## 2016-02-02 DIAGNOSIS — F32A Depression, unspecified: Secondary | ICD-10-CM

## 2016-02-02 DIAGNOSIS — Z79899 Other long term (current) drug therapy: Secondary | ICD-10-CM | POA: Diagnosis not present

## 2016-02-02 DIAGNOSIS — Z0000 Encounter for general adult medical examination without abnormal findings: Secondary | ICD-10-CM

## 2016-02-02 DIAGNOSIS — R55 Syncope and collapse: Secondary | ICD-10-CM | POA: Diagnosis not present

## 2016-02-02 DIAGNOSIS — I10 Essential (primary) hypertension: Secondary | ICD-10-CM

## 2016-02-02 DIAGNOSIS — M5136 Other intervertebral disc degeneration, lumbar region: Secondary | ICD-10-CM | POA: Diagnosis not present

## 2016-02-02 DIAGNOSIS — Z6826 Body mass index (BMI) 26.0-26.9, adult: Secondary | ICD-10-CM

## 2016-02-02 DIAGNOSIS — G894 Chronic pain syndrome: Secondary | ICD-10-CM

## 2016-02-02 DIAGNOSIS — E559 Vitamin D deficiency, unspecified: Secondary | ICD-10-CM

## 2016-02-02 DIAGNOSIS — F17200 Nicotine dependence, unspecified, uncomplicated: Secondary | ICD-10-CM

## 2016-02-02 DIAGNOSIS — E782 Mixed hyperlipidemia: Secondary | ICD-10-CM

## 2016-02-02 DIAGNOSIS — R6889 Other general symptoms and signs: Secondary | ICD-10-CM | POA: Diagnosis not present

## 2016-02-02 DIAGNOSIS — F329 Major depressive disorder, single episode, unspecified: Secondary | ICD-10-CM | POA: Diagnosis not present

## 2016-02-02 DIAGNOSIS — J449 Chronic obstructive pulmonary disease, unspecified: Secondary | ICD-10-CM

## 2016-02-02 NOTE — Progress Notes (Signed)
MEDICARE ANNUAL WELLNESS VISIT AND FOLLOW UP Assessment:    1. Essential hypertension -cont meds -well controlled -dash diet  -exercise as tolerated  2. Syncope, unspecified syncope type -no further episodes -will change in computer to history of syncope  3. Chronic obstructive pulmonary disease, unspecified COPD type ([place]) -still smoking -not interested in quitting  4. DDD, lumbar -sees pain management  5. CKD (chronic kidney disease) stage 2, GFR 60-89 ml/min -cont monitoring BMET -avoid large amounts of NSAIDs  6. BMI 26.08,   adult -cont diet and exercise  7. Chronic pain syndrome -follows with pain management  8. Depression, controlled -have encouraged her to seek counseling -Dr. Melford Aase has referred her to psychiatry  9. Mixed hyperlipidemia -cont meds -cont diet and exercise -check twice yearly -too soon for labs  10. Medicare annual wellness visit, subsequent -due next year  1. Medication management -cont labs twice yearly  12. Other abnormal glucose -cont diet and exercise -check A1c regularly  13. Tobacco use disorder -still smoking -not interested in quitting  14. Vitamin D deficiency -cont Vit D    Over 30 minutes of exam, counseling, chart review, and critical decision making was performed  Future Appointments Date Time Provider [place]  03/24/2016 11:15 AM Vicie Mutters, PA-C [facility] None  07/05/2016 2:00 PM Unk Pinto, MD [facility] None     Plan:   During the course of the visit the patient was educated and counseled about appropriate screening and preventive services including:    Pneumococcal vaccine   Influenza vaccine  Prevnar 13  Td vaccine  Screening electrocardiogram  Colorectal cancer screening  Diabetes screening  Glaucoma screening  Nutrition counseling    Subjective:  Amber Hickman is a 60 y.o. female who presents for Medicare Annual Wellness Visit and 3 month follow up for HTN,  hyperlipidemia, prediabetes, and vitamin D Def.   Her blood pressure has been controlled at home, today their BP is BP: 116/64 She does not workout. She denies chest pain, shortness of breath, dizziness.  She is not on cholesterol medication and denies myalgias. Her cholesterol is not at goal. The cholesterol last visit was:   Lab Results  Component Value Date   CHOL 161 12/11/2015   HDL 51 12/11/2015   LDLCALC 79 12/11/2015   TRIG 153 (H) 12/11/2015   CHOLHDL 3.2 12/11/2015    :  Lab Results  Component Value Date   HGBA1C 5.4 12/11/2015   Last GFR Lab Results  Component Value Date   GFRNONAA 67 12/11/2015     Lab Results  Component Value Date   GFRAA 77 12/11/2015   Patient is on Vitamin D supplement.   Lab Results  Component Value Date   VD25OH 43 12/11/2015      Medication Review: Current Outpatient Prescriptions on File Prior to Visit  Medication Sig Dispense Refill  . albuterol (PROVENTIL HFA;VENTOLIN HFA) 108 (90 BASE) MCG/ACT inhaler Inhale 2 puffs into the lungs every 6 (six) hours as needed for wheezing or shortness of breath. 1 Inhaler 2  . cholecalciferol (VITAMIN D) 1000 UNITS tablet Take 5,000 Units by mouth daily.    . clobetasol (TEMOVATE) 0.05 % external solution apply 1 APPLICATION to affected area twice a day 50 mL 0  . FLUoxetine (PROZAC) 40 MG capsule take 1 capsule by mouth twice a day 180 capsule 1  . gabapentin (NEURONTIN) 300 MG capsule Take 300 mg by mouth 2 (two) times daily as needed (pain). Reported on 09/04/2015  0  .  oxyCODONE-acetaminophen (PERCOCET) 10-325 MG per tablet Take 1 tablet by mouth every 6 (six) hours as needed for pain.    . simvastatin (ZOCOR) 40 MG tablet take 1 tablet by mouth at bedtime 90 tablet 1  . tiZANidine (ZANAFLEX) 2 MG tablet Take 2 mg by mouth every 6 (six) hours as needed for muscle spasms.    Marland Kitchen topiramate (TOPAMAX) 25 MG tablet Take 50 mg by mouth at bedtime.      No current facility-administered medications on  file prior to visit.     Allergies: Allergies  Allergen Reactions  . Augmentin [Amoxicillin-Pot Clavulanate]   . Codeine Nausea And Vomiting  . Lipitor [Atorvastatin]     Leg pain  . Wellbutrin [Bupropion]     Pt does not recall reaction   . Zoloft [Sertraline Hcl]     Pt does not recall reaction     Current Problems (verified) has Hyperlipidemia; Other abnormal glucose; Vitamin D deficiency; COPD (chronic obstructive pulmonary disease) ([place]); Medication management; CKD (chronic kidney disease) stage 2, GFR 60-89 ml/min; Tobacco use disorder; DDD, lumbar; Chronic pain syndrome; Depression, controlled; BMI 26.08,   adult; Medicare annual wellness visit, subsequent; Syncope; and HTN (hypertension) on her problem list.  Screening Tests Immunization History  Administered Date(s) Administered  . Td 01/19/2004, 06/05/2015    Preventative care: Last colonoscopy: 2007, due Mammogram 06/05/14  Prior vaccinations: TD or Tdap 2017  Influenza: Declined  Pneumococcal: Due, but declined Prevnar13: Declined Shingles/Zostavax: Declined  Names of Other Physician/Practitioners you currently use: 1. [facility] here for primary care 2. Dr. Delman Cheadle, eye doctor, last visit 2010, has also had cataract surgery with Dr. Katy Fitch 3. Dr.Mandolenich , dentist, last visit 2017 Patient Care Team: Unk Pinto, MD as PCP - General (Internal Medicine) Richmond Campbell, MD as Consulting Physician (Gastroenterology)  Surgical: She  has a past surgical history that includes Cataract extraction (Bilateral); Spine surgery; Ankle surgery (Right); and Tonsillectomy and adenoidectomy. Family Her family history includes COPD in her mother; Cancer in her mother; Heart disease in her father; Hypertension in her mother; Stroke in her mother. Social history  She reports that she has been smoking.  She has a 15.00 pack-year smoking history. She has never used smokeless tobacco.  She reports that she does not drink alcohol or use drugs.  MEDICARE WELLNESS OBJECTIVES: Physical activity: Current Exercise Habits: The patient does not participate in regular exercise at present, Exercise limited by: orthopedic condition(s) Cardiac risk factors: Cardiac Risk Factors include: advanced age (>5men, >86 women);hypertension;dyslipidemia;diabetes mellitus;smoking/ tobacco exposure;sedentary lifestyle Depression/mood screen:   Depression screen [facility] 2/9 02/02/2016  Decreased Interest 0  Down, Depressed, Hopeless 0  PHQ - 2 Score 0    ADLs:  In your present state of health, do you have any difficulty performing the following activities: 02/02/2016 12/11/2015  Hearing? N N  Vision? N N  Difficulty concentrating or making decisions? N N  Walking or climbing stairs? Y N  Dressing or bathing? N N  Doing errands, shopping? N N  Preparing Food and eating ? N -  Using the Toilet? N -  In the past six months, have you accidently leaked urine? N -  Do you have problems with loss of bowel control? N -  Managing your Medications? N -  Managing your Finances? N -  Housekeeping or managing your Housekeeping? N -  Some recent data might be hidden     Cognitive Testing  Alert? Yes  Normal Appearance?Yes  Oriented  to person? Yes  Place? Yes   Time? Yes  Recall of three objects?  Yes  Can perform simple calculations? Yes  Displays appropriate judgment?Yes  Can read the correct time from a watch face?Yes  EOL planning: Does Patient Have a Medical Advance Directive?: Yes Type of Advance Directive: Healthcare Power of Attorney, Living will Does patient want to make changes to medical advance directive?: Yes (MAU/Ambulatory/Procedural Areas - Information given) Copy of [place] in Chart?: No - copy requested   Objective:   Today's Vitals   02/02/16 1057  BP: 116/64  Pulse: 60  Resp: 16  Temp: 98 F (36.7 C)  TempSrc: Temporal  Weight: 140 lb (63.5 kg)   Height: 5\' 4"  (1.626 m)   Body mass index is 24.03 kg/m.  General appearance: alert, no distress, WD/WN, female HEENT: normocephalic, sclerae anicteric, TMs pearly, nares patent, no discharge or erythema, pharynx normal Oral cavity: MMM, no lesions Neck: supple, no lymphadenopathy, no thyromegaly, no masses Heart: RRR, normal S1, S2, no murmurs Lungs: CTA bilaterally, no wheezes, rhonchi, or rales Abdomen: +bs, soft, non tender, non distended, no masses, no hepatomegaly, no splenomegaly Musculoskeletal: nontender, no swelling, no obvious deformity Extremities: no edema, no cyanosis, no clubbing Pulses: 2+ symmetric, upper and lower extremities, normal cap refill Neurological: alert, oriented x 3, CN2-12 intact, strength normal upper extremities and lower extremities, sensation normal throughout, DTRs 2+ throughout, no cerebellar signs, gait normal Psychiatric: normal affect, behavior normal, pleasant   Medicare Attestation I have personally reviewed: The patient's medical and social history Their use of alcohol, tobacco or illicit drugs Their current medications and supplements The patient's functional ability including ADLs,fall risks, home safety risks, cognitive, and hearing and visual impairment Diet and physical activities Evidence for depression or mood disorders  The patient's weight, height, BMI, and visual acuity have been recorded in the chart.  I have made referrals, counseling, and provided education to the patient based on review of the above and I have provided the patient with a written personalized care plan for preventive services.     Starlyn Skeans, PA-C   02/02/2016

## 2016-03-12 DIAGNOSIS — M62830 Muscle spasm of back: Secondary | ICD-10-CM | POA: Diagnosis not present

## 2016-03-12 DIAGNOSIS — M47812 Spondylosis without myelopathy or radiculopathy, cervical region: Secondary | ICD-10-CM | POA: Diagnosis not present

## 2016-03-12 DIAGNOSIS — M47816 Spondylosis without myelopathy or radiculopathy, lumbar region: Secondary | ICD-10-CM | POA: Diagnosis not present

## 2016-03-12 DIAGNOSIS — Z79891 Long term (current) use of opiate analgesic: Secondary | ICD-10-CM | POA: Diagnosis not present

## 2016-03-12 DIAGNOSIS — M961 Postlaminectomy syndrome, not elsewhere classified: Secondary | ICD-10-CM | POA: Diagnosis not present

## 2016-03-12 DIAGNOSIS — G894 Chronic pain syndrome: Secondary | ICD-10-CM | POA: Diagnosis not present

## 2016-03-24 ENCOUNTER — Encounter: Payer: Self-pay | Admitting: Physician Assistant

## 2016-03-24 ENCOUNTER — Ambulatory Visit (INDEPENDENT_AMBULATORY_CARE_PROVIDER_SITE_OTHER): Payer: Medicare Other | Admitting: Physician Assistant

## 2016-03-24 VITALS — BP 120/70 | HR 72 | Temp 97.2°F | Resp 14 | Ht 64.0 in | Wt 147.2 lb

## 2016-03-24 DIAGNOSIS — G894 Chronic pain syndrome: Secondary | ICD-10-CM

## 2016-03-24 DIAGNOSIS — I10 Essential (primary) hypertension: Secondary | ICD-10-CM | POA: Diagnosis not present

## 2016-03-24 DIAGNOSIS — F17200 Nicotine dependence, unspecified, uncomplicated: Secondary | ICD-10-CM

## 2016-03-24 DIAGNOSIS — J449 Chronic obstructive pulmonary disease, unspecified: Secondary | ICD-10-CM | POA: Diagnosis not present

## 2016-03-24 DIAGNOSIS — Z79899 Other long term (current) drug therapy: Secondary | ICD-10-CM

## 2016-03-24 DIAGNOSIS — E782 Mixed hyperlipidemia: Secondary | ICD-10-CM

## 2016-03-24 LAB — CBC WITH DIFFERENTIAL/PLATELET
BASOS PCT: 0 %
Basophils Absolute: 0 cells/uL (ref 0–200)
EOS PCT: 4 %
Eosinophils Absolute: 236 cells/uL (ref 15–500)
HCT: 40.9 % (ref 35.0–45.0)
Hemoglobin: 13.1 g/dL (ref 11.7–15.5)
LYMPHS PCT: 30 %
Lymphs Abs: 1770 cells/uL (ref 850–3900)
MCH: 30.5 pg (ref 27.0–33.0)
MCHC: 32 g/dL (ref 32.0–36.0)
MCV: 95.1 fL (ref 80.0–100.0)
MONOS PCT: 6 %
MPV: 10.2 fL (ref 7.5–12.5)
Monocytes Absolute: 354 cells/uL (ref 200–950)
NEUTROS ABS: 3540 {cells}/uL (ref 1500–7800)
Neutrophils Relative %: 60 %
PLATELETS: 276 10*3/uL (ref 140–400)
RBC: 4.3 MIL/uL (ref 3.80–5.10)
RDW: 12.9 % (ref 11.0–15.0)
WBC: 5.9 10*3/uL (ref 3.8–10.8)

## 2016-03-24 MED ORDER — PRAZOSIN HCL 1 MG PO CAPS: ORAL_CAPSULE | ORAL | 0 refills | Status: DC

## 2016-03-24 NOTE — Progress Notes (Signed)
Assessment and Plan:   Essential hyertension - continue medications, DASH diet, exercise and monitor at home. Call if greater than 130/80.  -     CBC with Differential/Platelet -     BASIC METABOLIC PANEL WITH GFR -     Hepatic function panel -     TSH  Chronic obstructive pulmonary disease, unspecified COPD type ([place]) Advised to stop smoking, continue meds.   Mixed hyperlipidemia -continue medications, check lipids, decrease fatty foods, increase activity.  -     Lipid panel  Medication management -     Magnesium  Chronic pain syndrome Will add on for possible night terrors/PTSD Follow up pain management Suggest calling counselors, no SI/HI -     prazosin (MINIPRESS) 1 MG capsule; 1-2 at night for dreams  Tobacco use disorder Not ready to quit at this time   Continue diet and meds as discussed. Further disposition pending results of labs. Discussed med's effects and SE's.    HPI 61 y.o. female  presents for 3 month follow up with hypertension, hyperlipidemia, diabetes and vitamin D deficiency.  She reports that she has been having a lot of issues with depression , she is on disability, her dog has MM and is dying. She has been on prozac BID, states has missed last few days, not sleeping well at night due to night mares/terrors  She does still see pain management for her back pain.  They are prescribing her pain medications.   She is still smoking, not ready to quit at this time.    Her blood pressure has been controlled at home, today their BP is BP: 120/70.She does not workout. She denies chest pain, shortness of breath, dizziness.   She is on cholesterol medication and denies myalgias. Her cholesterol is at goal. The cholesterol was:  12/11/2015: Cholesterol 161; HDL 51; LDL Cholesterol 79; Triglycerides 153   She has been working on diet and exercise for prediabetes, she is on bASA, she is on ACE/ARB, and denies  foot ulcerations, hyperglycemia, hypoglycemia , increased  appetite, nausea, paresthesia of the feet, polydipsia, polyuria, visual disturbances, vomiting and weight loss. Last A1C was: 12/11/2015: Hgb A1c MFr Bld 5.4   Patient is on Vitamin D supplement. 12/11/2015: Vit D, 25-Hydroxy 43  BMI is Body mass index is 25.27 kg/m., she is working on diet and exercise. Wt Readings from Last 3 Encounters:  03/24/16 147 lb 3.2 oz (66.8 kg)  02/02/16 140 lb (63.5 kg)  12/11/15 138 lb 3.2 oz (62.7 kg)     Current Medications:  Current Outpatient Prescriptions on File Prior to Visit  Medication Sig Dispense Refill  . albuterol (PROVENTIL HFA;VENTOLIN HFA) 108 (90 BASE) MCG/ACT inhaler Inhale 2 puffs into the lungs every 6 (six) hours as needed for wheezing or shortness of breath. 1 Inhaler 2  . cholecalciferol (VITAMIN D) 1000 UNITS tablet Take 5,000 Units by mouth daily.    . clobetasol (TEMOVATE) 0.05 % external solution apply 1 APPLICATION to affected area twice a day 50 mL 0  . FLUoxetine (PROZAC) 40 MG capsule take 1 capsule by mouth twice a day 180 capsule 1  . gabapentin (NEURONTIN) 300 MG capsule Take 300 mg by mouth 2 (two) times daily as needed (pain). Reported on 09/04/2015  0  . oxyCODONE-acetaminophen (PERCOCET) 10-325 MG per tablet Take 1 tablet by mouth every 6 (six) hours as needed for pain.    . simvastatin (ZOCOR) 40 MG tablet take 1 tablet by mouth at bedtime 90  tablet 1  . tiZANidine (ZANAFLEX) 2 MG tablet Take 2 mg by mouth every 6 (six) hours as needed for muscle spasms.    Marland Kitchen topiramate (TOPAMAX) 25 MG tablet Take 50 mg by mouth at bedtime.      No current facility-administered medications on file prior to visit.    Medical History:  Past Medical History:  Diagnosis Date  . Allergy   . Arthritis   . AVN (avascular necrosis of bone) (H[facility])    Left shoulder  . COPD (chronic obstructive pulmonary disease) ([place])   . Depression   . Diabetes mellitus without complication ([facility])    Pre-diabetes.   Marland Kitchen GERD (gastroesophageal reflux  disease)   . Hyperlipidemia   . Hypertension   . Vitamin D deficiency    Allergies:  Allergies  Allergen Reactions  . Augmentin [Amoxicillin-Pot Clavulanate]   . Codeine Nausea And Vomiting  . Lipitor [Atorvastatin]     Leg pain  . Wellbutrin [Bupropion]     Pt does not recall reaction   . Zoloft [Sertraline Hcl]     Pt does not recall reaction      Review of Systems:  Review of Systems  Constitutional: Negative for chills, fever and malaise/fatigue.  HENT: Negative for congestion, ear pain and sore throat.   Eyes: Negative.   Respiratory: Negative for cough, shortness of breath and wheezing.   Cardiovascular: Negative for chest pain, palpitations and leg swelling.  Gastrointestinal: Negative for abdominal pain, blood in stool, constipation, diarrhea, heartburn and melena.  Genitourinary: Negative.   Skin: Negative.   Neurological: Negative for dizziness, sensory change, loss of consciousness and headaches.  Psychiatric/Behavioral: Negative for depression. The patient is not nervous/anxious and does not have insomnia.     Family history- Review and unchanged  Social history- Review and unchanged  Physical Exam: BP 120/70   Pulse 72   Temp 97.2 F (36.2 C)   Resp 14   Ht 5\' 4"  (1.626 m)   Wt 147 lb 3.2 oz (66.8 kg)   SpO2 98%   BMI 25.27 kg/m  Wt Readings from Last 3 Encounters:  03/24/16 147 lb 3.2 oz (66.8 kg)  02/02/16 140 lb (63.5 kg)  12/11/15 138 lb 3.2 oz (62.7 kg)   General Appearance: Well nourished well developed, non-toxic appearing, in no apparent distress. Eyes: PERRLA, EOMs, conjunctiva no swelling or erythema ENT/Mouth: Ear canals clear with no erythema, swelling, or discharge.  TMs normal bilaterally, oropharynx clear, moist, with no exudate.   Neck: Supple, thyroid normal, no JVD, no cervical adenopathy.  Respiratory: Respiratory effort normal, breath sounds clear A&P, inspiratory wheeze no rhonchi or rales noted.  No retractions, no accessory  muscle usage Cardio: RRR with no MRGs. No noted edema.  Abdomen: Soft, + BS.  Non tender, no guarding, rebound, hernias, masses. Musculoskeletal: Full ROM, 5/5 strength, Normal gait Skin: Warm, dry without rashes, lesions, ecchymosis.  Neuro: Awake and oriented X 3, Cranial nerves intact. No cerebellar symptoms.  Psych: normal affect, Insight and Judgment appropriate.    Vicie Mutters, PA-C 11:19 AM [facility]

## 2016-03-24 NOTE — Patient Instructions (Addendum)
Call about counselors  Start on 1 minipress pill for 3-5 nights, then go up to 2 a day Call the office to let us know how you are doing  Make appointment with GI for colonoscopy  Persistent Depressive Disorder, Adult Persistent depressive disorder (PDD) is a mental health condition that causes symptoms of low-level depression for 2 years or longer. It may also be called long-term (chronic) depression or dysthymia. PDD may include episodes of more severe depression that last for about 2 weeks (major depressive disorder or MDD). PDD can affect the way you think, feel, and sleep. This condition may also affect your relationships. You may be more likely to get sick if you have PDD. What are the causes? The exact cause of this condition is not known. PDD is most likely caused by a combination of things, which may include:  Genetic factors. These are traits that are passed along from parent to child.  Individual factors. Your personality, your behavior, and the way you handle your thoughts and feelings may contribute to PDD. This includes personality traits and behaviors learned from others.  Physical factors, such as:  Differences in the part of your brain that controls emotion. This part of your brain may be different than it is in people who do not have PDD.  Long-term (chronic) medical or psychiatric illnesses.  Social factors. Traumatic experiences or major life changes may play a role in the development of PDD. What increases the risk? This condition is more likely to develop in women. The following factors may make you more likely to develop PDD:  A family history of depression.  Abnormally low levels of certain brain chemicals.  Traumatic events in childhood, especially abuse or the loss of a parent.  Being under a lot of stress, or long-term stress, especially from upsetting life experiences or losses.  A history of:  Chronic physical illness.  Other mental health  disorders.  Substance abuse.  Poor living conditions.  Experiencing social exclusion or discrimination on a regular basis. What are the signs or symptoms? Symptoms of this condition occur for most of the day, and may include:  Fatigue or low energy.  Eating too much or too little.  Sleeping too much or too little.  Restlessness or agitation.  Feelings of hopelessness.  Feeling worthless or guilty.  Anxiety.  Poor concentration or difficulty making decisions.  Low self-esteem.  Negative outlook.  Inability to have fun or experience pleasure.  Social withdrawal.  Unexplained physical complaints.  Irritability.  Aggressive behavior or anger. How is this diagnosed? This condition may be diagnosed based on:  Your symptoms.  Your medical history, including your mental health history. This may involve tests to evaluate your mental health. You may be asked questions about your lifestyle, including any drug and alcohol use, and how long you have had symptoms of PDD.  A physical exam.  Blood tests to rule out other conditions. You may be diagnosed with PDD if you have had a depressed mood for 2 years or longer, as well as other symptoms of depression. How is this treated? This condition is usually treated by mental health professionals, such as psychologists, psychiatrists, and clinical social workers. You may need more than one type of treatment. Treatment may include:  Psychotherapy. This is also called talk therapy or counseling. Types of psychotherapy include:  Cognitive behavioral therapy (CBT). This type of therapy teaches you to recognize unhealthy feelings, thoughts, and behaviors, and replace them with positive thoughts and actions.  Interpersonal therapy (IPT). This helps you to improve the way you relate to and communicate with others.  Family therapy. This treatment includes members of your family.  Medicine to treat anxiety and depression, or to help  you control certain emotions and behaviors.  Lifestyle changes, such as:  Limiting alcohol and drug use.  Exercising regularly.  Getting plenty of sleep.  Making healthy eating choices.  Spending more time outdoors. Follow these instructions at home: Activity  Return to your normal activities as told by your health care provider.  Exercise regularly and spend time outdoors as told by your health care provider. General instructions  Take over-the-counter and prescription medicines only as told by your health care provider.  Do not drink alcohol. If you drink alcohol, limit your alcohol intake to no more than 1 drink a day for nonpregnant women and 2 drinks a day for men. One drink equals 12 oz of beer, 5 oz of wine, or 1 oz of hard liquor. Alcohol can affect any antidepressant medicines you are taking. Talk to your health care provider about your alcohol use.  Eat a healthy diet and get plenty of sleep.  Find activities that you enjoy doing, and make time to do them.  Consider joining a support group. Your health care provider may be able to recommend a support group.  Keep all follow-up visits as told by your health care provider. This is important. Where to find more information: [employer] on Mental Illness  www.nami.org [place] National Institute of Mental Health  https://carter.com/ National Suicide Prevention Lifeline  1-800-273-TALK 416-419-7829). This is free, 24-hour help. Contact a health care provider if:  Your symptoms get worse.  You develop new symptoms.  You have trouble sleeping or doing your daily activities. Get help right away if:  You self-harm.  You have serious thoughts about hurting yourself or others.  You see, hear, taste, smell, or feel things that are not present (hallucinate). This information is not intended to replace advice given to you by your health care provider. Make sure you discuss any questions you have with your health care  provider. Document Released: 01/19/2012 Document Revised: 10/02/2015 Document Reviewed: 08/16/2015 Elsevier Interactive Patient Education  2017 [employer] Inc.  Irritable Bowel Syndrome, Adult Irritable bowel syndrome (IBS) is not one specific disease. It is a group of symptoms that affects the organs responsible for digestion (gastrointestinal or GI tract). To regulate how your GI tract works, your body sends signals back and forth between your intestines and your brain. If you have IBS, there may be a problem with these signals. As a result, your GI tract does not function normally. Your intestines may become more sensitive and overreact to certain things. This is especially true when you eat certain foods or when you are under stress. There are four types of IBS. These may be determined based on the consistency of your stool:  IBS with diarrhea.  IBS with constipation.  Mixed IBS.  Unsubtyped IBS. It is important to know which type of IBS you have. Some treatments are more likely to be helpful for certain types of IBS. What are the causes? The exact cause of IBS is not known. What increases the risk? You may have a higher risk of IBS if:  You are a woman.  You are younger than 62 years old.  You have a family history of IBS.  You have mental health problems.  You have had bacterial infection of your GI tract. What are  the signs or symptoms? Symptoms of IBS vary from person to person. The main symptom is abdominal pain or discomfort. Additional symptoms usually include one or more of the following:  Diarrhea, constipation, or both.  Abdominal swelling or bloating.  Feeling full or sick after eating a small or regular-size meal.  Frequent gas.  Mucus in the stool.  A feeling of having more stool left after a bowel movement. Symptoms tend to come and go. They may be associated with stress, psychiatric conditions, or nothing at all. How is this diagnosed? There is no  specific test to diagnose IBS. Your health care provider will make a diagnosis based on a physical exam, medical history, and your symptoms. You may have other tests to rule out other conditions that may be causing your symptoms. These may include:  Blood tests.  X-rays.  CT scan.  Endoscopy and colonoscopy. This is a test in which your GI tract is viewed with a long, thin, flexible tube. How is this treated? There is no cure for IBS, but treatment can help relieve symptoms. IBS treatment often includes:  Changes to your diet, such as:  Eating more fiber.  Avoiding foods that cause symptoms.  Drinking more water.  Eating regular, medium-sized portioned meals.  Medicines. These may include:  Fiber supplements if you have constipation.  Medicine to control diarrhea (antidiarrheal medicines).  Medicine to help control muscle spasms in your GI tract (antispasmodic medicines).  Medicines to help with any mental health issues, such as antidepressants or tranquilizers.  Therapy.  Talk therapy may help with anxiety, depression, or other mental health issues that can make IBS symptoms worse.  Stress reduction.  Managing your stress can help keep symptoms under control. Follow these instructions at home:  Take medicines only as directed by your health care provider.  Eat a healthy diet.  Avoid foods and drinks with added sugar.  Include more whole grains, fruits, and vegetables gradually into your diet. This may be especially helpful if you have IBS with constipation.  Avoid any foods and drinks that make your symptoms worse. These may include dairy products and caffeinated or carbonated drinks.  Do not eat large meals.  Drink enough fluid to keep your urine clear or pale yellow.  Exercise regularly. Ask your health care provider for recommendations of good activities for you.  Keep all follow-up visits as directed by your health care provider. This is  important. Contact a health care provider if:  You have constant pain.  You have trouble or pain with swallowing.  You have worsening diarrhea. Get help right away if:  You have severe and worsening abdominal pain.  You have diarrhea and:  You have a rash, stiff neck, or severe headache.  You are irritable, sleepy, or difficult to awaken.  You are weak, dizzy, or extremely thirsty.  You have bright red blood in your stool or you have black tarry stools.  You have unusual abdominal swelling that is painful.  You vomit continuously.  You vomit blood (hematemesis).  You have both abdominal pain and a fever. This information is not intended to replace advice given to you by your health care provider. Make sure you discuss any questions you have with your health care provider. Document Released: 02/01/2005 Document Revised: 07/04/2015 Document Reviewed: 10/19/2013 Elsevier Interactive Patient Education  2017 [employer].

## 2016-03-25 LAB — BASIC METABOLIC PANEL WITH GFR
BUN: 8 mg/dL (ref 7–25)
CHLORIDE: 109 mmol/L (ref 98–110)
CO2: 24 mmol/L (ref 20–31)
Calcium: 9 mg/dL (ref 8.6–10.4)
Creat: 0.89 mg/dL (ref 0.50–0.99)
GFR, EST AFRICAN AMERICAN: 81 mL/min (ref >=60)
GFR, Est Non African American: 70 mL/min (ref >=60)
Glucose, Bld: 96 mg/dL (ref 65–99)
POTASSIUM: 3.9 mmol/L (ref 3.5–5.3)
SODIUM: 141 mmol/L (ref 135–146)

## 2016-03-25 LAB — LIPID PANEL
CHOL/HDL RATIO: 2.4 ratio (ref <5.0)
Cholesterol: 156 mg/dL (ref <200)
HDL: 65 mg/dL (ref >50)
LDL CALC: 72 mg/dL (ref <100)
TRIGLYCERIDES: 96 mg/dL (ref <150)
VLDL: 19 mg/dL (ref <30)

## 2016-03-25 LAB — HEPATIC FUNCTION PANEL
ALBUMIN: 4.2 g/dL (ref 3.6–5.1)
ALK PHOS: 94 U/L (ref 33–130)
ALT: 14 U/L (ref 6–29)
AST: 17 U/L (ref 10–35)
BILIRUBIN DIRECT: 0.1 mg/dL (ref <=0.2)
Indirect Bilirubin: 0.2 mg/dL (ref 0.2–1.2)
Total Bilirubin: 0.3 mg/dL (ref 0.2–1.2)
Total Protein: 6.3 g/dL (ref 6.1–8.1)

## 2016-03-25 LAB — TSH: TSH: 0.93 m[IU]/L

## 2016-03-25 LAB — MAGNESIUM: MAGNESIUM: 2.1 mg/dL (ref 1.5–2.5)

## 2016-03-25 NOTE — Progress Notes (Signed)
Pt aware of lab results & voiced understanding of those results.

## 2016-04-09 DIAGNOSIS — M62830 Muscle spasm of back: Secondary | ICD-10-CM | POA: Diagnosis not present

## 2016-04-09 DIAGNOSIS — M47816 Spondylosis without myelopathy or radiculopathy, lumbar region: Secondary | ICD-10-CM | POA: Diagnosis not present

## 2016-04-09 DIAGNOSIS — M47812 Spondylosis without myelopathy or radiculopathy, cervical region: Secondary | ICD-10-CM | POA: Diagnosis not present

## 2016-04-09 DIAGNOSIS — G894 Chronic pain syndrome: Secondary | ICD-10-CM | POA: Diagnosis not present

## 2016-04-28 ENCOUNTER — Other Ambulatory Visit: Payer: Self-pay | Admitting: Physician Assistant

## 2016-04-28 DIAGNOSIS — G894 Chronic pain syndrome: Secondary | ICD-10-CM

## 2016-05-10 DIAGNOSIS — M47812 Spondylosis without myelopathy or radiculopathy, cervical region: Secondary | ICD-10-CM | POA: Diagnosis not present

## 2016-05-10 DIAGNOSIS — M62830 Muscle spasm of back: Secondary | ICD-10-CM | POA: Diagnosis not present

## 2016-05-10 DIAGNOSIS — G894 Chronic pain syndrome: Secondary | ICD-10-CM | POA: Diagnosis not present

## 2016-05-10 DIAGNOSIS — M47816 Spondylosis without myelopathy or radiculopathy, lumbar region: Secondary | ICD-10-CM | POA: Diagnosis not present

## 2016-06-07 DIAGNOSIS — M47816 Spondylosis without myelopathy or radiculopathy, lumbar region: Secondary | ICD-10-CM | POA: Diagnosis not present

## 2016-06-07 DIAGNOSIS — G894 Chronic pain syndrome: Secondary | ICD-10-CM | POA: Diagnosis not present

## 2016-06-07 DIAGNOSIS — M62830 Muscle spasm of back: Secondary | ICD-10-CM | POA: Diagnosis not present

## 2016-06-07 DIAGNOSIS — M47812 Spondylosis without myelopathy or radiculopathy, cervical region: Secondary | ICD-10-CM | POA: Diagnosis not present

## 2016-07-05 ENCOUNTER — Encounter: Payer: Self-pay | Admitting: Internal Medicine

## 2016-07-05 ENCOUNTER — Ambulatory Visit (INDEPENDENT_AMBULATORY_CARE_PROVIDER_SITE_OTHER): Payer: Medicare Other | Admitting: Internal Medicine

## 2016-07-05 VITALS — BP 100/68 | HR 72 | Temp 97.7°F | Resp 16 | Ht 64.0 in | Wt 150.6 lb

## 2016-07-05 DIAGNOSIS — E782 Mixed hyperlipidemia: Secondary | ICD-10-CM | POA: Diagnosis not present

## 2016-07-05 DIAGNOSIS — Z0000 Encounter for general adult medical examination without abnormal findings: Secondary | ICD-10-CM | POA: Diagnosis not present

## 2016-07-05 DIAGNOSIS — Z79899 Other long term (current) drug therapy: Secondary | ICD-10-CM | POA: Diagnosis not present

## 2016-07-05 DIAGNOSIS — I10 Essential (primary) hypertension: Secondary | ICD-10-CM | POA: Diagnosis not present

## 2016-07-05 DIAGNOSIS — R7303 Prediabetes: Secondary | ICD-10-CM

## 2016-07-05 DIAGNOSIS — Z1212 Encounter for screening for malignant neoplasm of rectum: Secondary | ICD-10-CM

## 2016-07-05 DIAGNOSIS — J449 Chronic obstructive pulmonary disease, unspecified: Secondary | ICD-10-CM

## 2016-07-05 DIAGNOSIS — F17200 Nicotine dependence, unspecified, uncomplicated: Secondary | ICD-10-CM

## 2016-07-05 DIAGNOSIS — Z0001 Encounter for general adult medical examination with abnormal findings: Secondary | ICD-10-CM

## 2016-07-05 DIAGNOSIS — E559 Vitamin D deficiency, unspecified: Secondary | ICD-10-CM

## 2016-07-05 DIAGNOSIS — Z136 Encounter for screening for cardiovascular disorders: Secondary | ICD-10-CM | POA: Diagnosis not present

## 2016-07-05 DIAGNOSIS — F17219 Nicotine dependence, cigarettes, with unspecified nicotine-induced disorders: Secondary | ICD-10-CM

## 2016-07-05 DIAGNOSIS — G894 Chronic pain syndrome: Secondary | ICD-10-CM

## 2016-07-05 DIAGNOSIS — M5136 Other intervertebral disc degeneration, lumbar region: Secondary | ICD-10-CM

## 2016-07-05 LAB — CBC WITH DIFFERENTIAL/PLATELET
BASOS ABS: 59 {cells}/uL (ref 0–200)
Basophils Relative: 1 %
EOS PCT: 3 %
Eosinophils Absolute: 177 cells/uL (ref 15–500)
HCT: 40.3 % (ref 35.0–45.0)
HEMOGLOBIN: 12.6 g/dL (ref 11.7–15.5)
LYMPHS ABS: 2360 {cells}/uL (ref 850–3900)
Lymphocytes Relative: 40 %
MCH: 29.3 pg (ref 27.0–33.0)
MCHC: 31.3 g/dL — ABNORMAL LOW (ref 32.0–36.0)
MCV: 93.7 fL (ref 80.0–100.0)
MPV: 10.1 fL (ref 7.5–12.5)
Monocytes Absolute: 354 cells/uL (ref 200–950)
Monocytes Relative: 6 %
NEUTROS ABS: 2950 {cells}/uL (ref 1500–7800)
Neutrophils Relative %: 50 %
PLATELETS: 291 10*3/uL (ref 140–400)
RBC: 4.3 MIL/uL (ref 3.80–5.10)
RDW: 13.3 % (ref 11.0–15.0)
WBC: 5.9 10*3/uL (ref 3.8–10.8)

## 2016-07-05 NOTE — Patient Instructions (Signed)

## 2016-07-05 NOTE — Progress Notes (Signed)
[facility] Unk Pinto, M.D.      Uvaldo Bristle. Silverio Lay, P.A.-C [facility]                [address] [place], [place] [address] Telephone [phone] Telefax [phone]  Annual Screening/Preventative Visit & Comprehensive Evaluation &  Examination     This very nice 61 y.o. single WF presents for a Screening/Preventative Visit & comprehensive evaluation and management of multiple medical co-morbidities.  Patient has been followed for HTN, Prediabetes, Hyperlipidemia and Vitamin D Deficiency.     She also has Chronic Lumbago consequent of Lumbar DDD undergoing  surg x 2 in 2004 with chronic pain &in 2013 lost her job in a downsize at [employer] and shortly thereafter went SS Disability.      Due to her long history of smoking over 35 pk years,  I discussed lung cancer screening with her.  She was agreeable to undergo a screening low dose CT scan of the chest.  We discussed smoking cessation techniques/options. I will refer her for a LDCT lung scan & lung cancer screening program      HTN predates since 1998. Patient's BP has been controlled at home and patient denies any cardiac symptoms as chest pain, palpitations, shortness of breath, dizziness or ankle swelling. Today's BP: 100/68      Patient's hyperlipidemia is controlled with diet and medications. Patient denies myalgias or other medication SE's. Last lipids were at goal: Lab Results  Component Value Date   CHOL 156 03/24/2016   HDL 65 03/24/2016   LDLCALC 72 03/24/2016   TRIG 96 03/24/2016   CHOLHDL 2.4 03/24/2016      Patient has prediabetes (A1c 5.8% in 2010)  and patient denies reactive hypoglycemic symptoms, visual blurring, diabetic polys, or paresthesias. Last A1c was at goal: Lab Results  Component Value Date   HGBA1C 5.4 12/11/2015      Finally, patient has history of Vitamin D Deficiency ("20" in 2008) and last Vitamin D  was low (goal 70-100): Lab Results  Component Value Date   VD25OH 43 12/11/2015   Current Outpatient Prescriptions on File Prior to Visit  Medication Sig  . albuterol (PROVENTIL HFA;VENTOLIN HFA) 108 (90 BASE) MCG/ACT inhaler Inhale 2 puffs into the lungs every 6 (six) hours as needed for wheezing or shortness of breath.  . cholecalciferol (VITAMIN D) 1000 UNITS tablet Take 5,000 Units by mouth daily.  . clobetasol (TEMOVATE) 0.05 % external solution apply 1 APPLICATION to affected area twice a day  . FLUoxetine (PROZAC) 40 MG capsule take 1 capsule by mouth twice a day  . gabapentin (NEURONTIN) 300 MG capsule Take 300 mg by mouth 2 (two) times daily as needed (pain). Reported on 09/04/2015  . oxyCODONE-acetaminophen (PERCOCET) 10-325 MG per tablet Take 1 tablet by mouth every 6 (six) hours as needed for pain.  . prazosin (MINIPRESS) 1 MG capsule take 1 to 2 capsules by mouth every evening for DREAMS  . simvastatin (ZOCOR) 40 MG tablet take 1 tablet by mouth at bedtime  . tiZANidine (ZANAFLEX) 2 MG tablet Take 2 mg by mouth every 6 (six) hours as needed for muscle spasms.  Marland Kitchen topiramate (TOPAMAX) 25 MG tablet Take 50 mg by mouth at bedtime.    No current facility-administered medications on file prior to visit.    Allergies  Allergen Reactions  .  Augmentin [Amoxicillin-Pot Clavulanate]   . Codeine Nausea And Vomiting  . Lipitor [Atorvastatin]     Leg pain  . Wellbutrin [Bupropion]     Pt does not recall reaction   . Zoloft [Sertraline Hcl]     Pt does not recall reaction    Past Medical History:  Diagnosis Date  . Allergy   . Arthritis   . AVN (avascular necrosis of bone) (HCC)    Left shoulder  . COPD (chronic obstructive pulmonary disease) ([place])   . Depression   . Diabetes mellitus without complication (HCC)    Pre-diabetes.   Marland Kitchen GERD (gastroesophageal reflux disease)   . Hyperlipidemia   . Hypertension   . Vitamin D deficiency    Health Maintenance  Topic Date Due  .  Hepatitis C Screening  28-Dec-1955  . HIV Screening  02/12/1970  . PAP SMEAR  01/18/2013  . COLONOSCOPY  01/19/2016  . MAMMOGRAM  06/04/2016  . TETANUS/TDAP  06/04/2025  . INFLUENZA VACCINE  Excluded   Immunization History  Administered Date(s) Administered  . Td 01/19/2004, 06/05/2015   Past Surgical History:  Procedure Laterality Date  . ANKLE SURGERY Right   . CATARACT EXTRACTION Bilateral   . SPINE SURGERY     lumbar  . TONSILLECTOMY AND ADENOIDECTOMY     Family History  Problem Relation Age of Onset  . Hypertension Mother   . COPD Mother   . Stroke Mother   . Cancer Mother        breast  . Heart disease Father        Age 69, MI   Social History  Substance Use Topics  . Smoking status: Current Every Day Smoker    Packs/day: 0.50    Years: 30.00  . Smokeless tobacco: Never Used  . Alcohol use No    ROS Constitutional: Denies fever, chills, weight loss/gain, headaches, insomnia,  night sweats, and change in appetite. Does c/o fatigue. Eyes: Denies redness, blurred vision, diplopia, discharge, itchy, watery eyes.  ENT: Denies discharge, congestion, post nasal drip, epistaxis, sore throat, earache, hearing loss, dental pain, Tinnitus, Vertigo, Sinus pain, snoring.  Cardio: Denies chest pain, palpitations, irregular heartbeat, syncope, dyspnea, diaphoresis, orthopnea, PND, claudication, edema Respiratory: denies cough, dyspnea, DOE, pleurisy, hoarseness, laryngitis, wheezing.  Gastrointestinal: Denies dysphagia, heartburn, reflux, water brash, pain, cramps, nausea, vomiting, bloating, diarrhea, constipation, hematemesis, melena, hematochezia, jaundice, hemorrhoids Genitourinary: Denies dysuria, frequency, urgency, nocturia, hesitancy, discharge, hematuria, flank pain Breast: Breast lumps, nipple discharge, bleeding.  Musculoskeletal: Denies arthralgia, myalgia, stiffness, Jt. Swelling, pain, limp, and strain/sprain. Denies falls. Skin: Denies puritis, rash, hives, warts,  acne, eczema, changing in skin lesion Neuro: No weakness, tremor, incoordination, spasms, paresthesia, pain Psychiatric: Denies confusion, memory loss, sensory loss. Denies Depression. Endocrine: Denies change in weight, skin, hair change, nocturia, and paresthesia, diabetic polys, visual blurring, hyper / hypo glycemic episodes.  Heme/Lymph: No excessive bleeding, bruising, enlarged lymph nodes.  Physical Exam  BP 100/68   Pulse 72   Temp 97.7 F (36.5 C)   Resp 16   Ht 5\' 4"  (1.626 m)   Wt 150 lb 9.6 oz (68.3 kg)   BMI 25.85 kg/m   General Appearance: Well nourished, well groomed and in no apparent distress.  Eyes: PERRLA, EOMs, conjunctiva no swelling or erythema, normal fundi and vessels. Sinuses: No frontal/maxillary tenderness ENT/Mouth: EACs patent / TMs  nl. Nares clear without erythema, swelling, mucoid exudates. Oral hygiene is good. No erythema, swelling, or exudate. Tongue normal, non-obstructing. Tonsils not swollen  or erythematous. Hearing normal.  Neck: Supple, thyroid normal. No bruits, nodes or JVD. Respiratory: Respiratory effort normal.  BS equal and clear bilateral without rales, rhonci, wheezing or stridor. Cardio: Heart sounds are normal with regular rate and rhythm and no murmurs, rubs or gallops. Peripheral pulses are normal and equal bilaterally without edema. No aortic or femoral bruits. Chest: symmetric with normal excursions and percussion. Breasts: Symmetric, without lumps, nipple discharge, retractions, or fibrocystic changes.  Abdomen: Flat, soft with bowel sounds active. Nontender, no guarding, rebound, hernias, masses, or organomegaly.  Lymphatics: Non tender without lymphadenopathy.  Musculoskeletal: Full ROM all peripheral extremities, joint stability, 5/5 strength, and normal gait. Skin: Warm and dry without rashes, lesions, cyanosis, clubbing or  ecchymosis.  Neuro: Cranial nerves intact, reflexes equal bilaterally. Normal muscle tone, no  cerebellar symptoms. Sensation intact.  Pysch: Alert and oriented X 3, normal affect, Insight and Judgment appropriate.   Assessment and Plan  1. Annual Preventative Screening Examination   2. Essential hypertension  - EKG 12-Lead - Urinalysis, Routine w reflex microscopic - Microalbumin / creatinine urine ratio - CBC with Differential/Platelet - BASIC METABOLIC PANEL WITH GFR - Magnesium - TSH  3. Hyperlipidemia, mixed  - EKG 12-Lead - Hepatic function panel - Lipid panel - TSH  4. Prediabetes  - EKG 12-Lead - Hemoglobin A1c - Insulin, random  5. Vitamin D deficiency  - VITAMIN D 25 Hydroxy   6. Chronic obstructive pulmonary disease ([place])   7. Chronic pain syndrome   8. DDD, lumbar   9. Screening for rectal cancer  - POC Hemoccult Bld/Stl   10. Screening for ischemic heart disease  - EKG 12-Lead  11. Medication management  - Urinalysis, Routine w reflex microscopic - Microalbumin / creatinine urine ratio - CBC with Differential/Platelet  12. Cigarette nicotine dependence with nicotine-induced disorder  - CT CHEST LUNG CA SCREEN LOW DOSE W/O CM; Future  13. Smoker  - CT CHEST LUNG CA SCREEN LOW DOSE W/O CM; Future       Patient was counseled in prudent diet to achieve/maintain BMI less than 25 for weight control, BP monitoring, regular exercise and medications. Discussed med's effects and SE's. Screening labs and tests as requested with regular follow-up as recommended. Over 40 minutes of exam, counseling, chart review and high complex critical decision making was performed.

## 2016-07-06 LAB — LIPID PANEL
CHOL/HDL RATIO: 2.6 ratio (ref <5.0)
Cholesterol: 146 mg/dL (ref <200)
HDL: 56 mg/dL (ref >50)
LDL CALC: 65 mg/dL (ref <100)
TRIGLYCERIDES: 125 mg/dL (ref <150)
VLDL: 25 mg/dL (ref <30)

## 2016-07-06 LAB — BASIC METABOLIC PANEL WITH GFR
BUN: 10 mg/dL (ref 7–25)
CO2: 23 mmol/L (ref 20–31)
Calcium: 9.1 mg/dL (ref 8.6–10.4)
Chloride: 107 mmol/L (ref 98–110)
Creat: 1 mg/dL — ABNORMAL HIGH (ref 0.50–0.99)
GFR, EST AFRICAN AMERICAN: 70 mL/min (ref >=60)
GFR, EST NON AFRICAN AMERICAN: 61 mL/min (ref >=60)
GLUCOSE: 74 mg/dL (ref 65–99)
POTASSIUM: 4.3 mmol/L (ref 3.5–5.3)
Sodium: 140 mmol/L (ref 135–146)

## 2016-07-06 LAB — URINALYSIS, ROUTINE W REFLEX MICROSCOPIC
Bilirubin Urine: NEGATIVE
GLUCOSE, UA: NEGATIVE
HGB URINE DIPSTICK: NEGATIVE
Ketones, ur: NEGATIVE
LEUKOCYTES UA: NEGATIVE
NITRITE: NEGATIVE
PH: 6.5 (ref 5.0–8.0)
Protein, ur: NEGATIVE
SPECIFIC GRAVITY, URINE: 1.009 (ref 1.001–1.035)

## 2016-07-06 LAB — HEPATIC FUNCTION PANEL
ALK PHOS: 95 U/L (ref 33–130)
ALT: 17 U/L (ref 6–29)
AST: 19 U/L (ref 10–35)
Albumin: 4.1 g/dL (ref 3.6–5.1)
BILIRUBIN INDIRECT: 0.2 mg/dL (ref 0.2–1.2)
BILIRUBIN TOTAL: 0.3 mg/dL (ref 0.2–1.2)
Bilirubin, Direct: 0.1 mg/dL (ref <=0.2)
Total Protein: 6.5 g/dL (ref 6.1–8.1)

## 2016-07-06 LAB — HEMOGLOBIN A1C
HEMOGLOBIN A1C: 5.5 % (ref <5.7)
Mean Plasma Glucose: 111 mg/dL

## 2016-07-06 LAB — MAGNESIUM: Magnesium: 2.1 mg/dL (ref 1.5–2.5)

## 2016-07-06 LAB — VITAMIN D 25 HYDROXY (VIT D DEFICIENCY, FRACTURES): VIT D 25 HYDROXY: 44 ng/mL (ref 30–100)

## 2016-07-06 LAB — MICROALBUMIN / CREATININE URINE RATIO
Creatinine, Urine: 80 mg/dL (ref 20–320)
MICROALB UR: 0.3 mg/dL
MICROALB/CREAT RATIO: 4 ug/mg{creat} (ref <30)

## 2016-07-06 LAB — TSH: TSH: 1.93 mIU/L

## 2016-07-06 LAB — INSULIN, RANDOM: INSULIN: 6.2 u[IU]/mL (ref 2.0–19.6)

## 2016-07-09 DIAGNOSIS — G894 Chronic pain syndrome: Secondary | ICD-10-CM | POA: Diagnosis not present

## 2016-07-09 DIAGNOSIS — M47816 Spondylosis without myelopathy or radiculopathy, lumbar region: Secondary | ICD-10-CM | POA: Diagnosis not present

## 2016-07-09 DIAGNOSIS — M47812 Spondylosis without myelopathy or radiculopathy, cervical region: Secondary | ICD-10-CM | POA: Diagnosis not present

## 2016-07-09 DIAGNOSIS — M62830 Muscle spasm of back: Secondary | ICD-10-CM | POA: Diagnosis not present

## 2016-07-14 ENCOUNTER — Ambulatory Visit
Admission: RE | Admit: 2016-07-14 | Discharge: 2016-07-14 | Disposition: A | Discharge status: Home / Self Care | Payer: Medicare Other | Source: Ambulatory Visit | Attending: Internal Medicine | Admitting: Internal Medicine

## 2016-07-14 DIAGNOSIS — Z136 Encounter for screening for cardiovascular disorders: Secondary | ICD-10-CM | POA: Diagnosis not present

## 2016-07-14 DIAGNOSIS — F17200 Nicotine dependence, unspecified, uncomplicated: Secondary | ICD-10-CM

## 2016-07-14 DIAGNOSIS — F17219 Nicotine dependence, cigarettes, with unspecified nicotine-induced disorders: Secondary | ICD-10-CM

## 2016-09-03 DIAGNOSIS — M62830 Muscle spasm of back: Secondary | ICD-10-CM | POA: Diagnosis not present

## 2016-09-03 DIAGNOSIS — M47816 Spondylosis without myelopathy or radiculopathy, lumbar region: Secondary | ICD-10-CM | POA: Diagnosis not present

## 2016-09-03 DIAGNOSIS — G894 Chronic pain syndrome: Secondary | ICD-10-CM | POA: Diagnosis not present

## 2016-09-03 DIAGNOSIS — M47812 Spondylosis without myelopathy or radiculopathy, cervical region: Secondary | ICD-10-CM | POA: Diagnosis not present

## 2016-09-15 ENCOUNTER — Ambulatory Visit
Admission: RE | Admit: 2016-09-15 | Discharge: 2016-09-15 | Disposition: A | Discharge status: Home / Self Care | Payer: Medicare Other | Source: Ambulatory Visit | Attending: Physical Medicine and Rehabilitation | Admitting: Physical Medicine and Rehabilitation

## 2016-09-15 ENCOUNTER — Other Ambulatory Visit: Payer: Self-pay | Admitting: Physical Medicine and Rehabilitation

## 2016-09-15 DIAGNOSIS — M25551 Pain in right hip: Secondary | ICD-10-CM

## 2016-09-15 DIAGNOSIS — M25552 Pain in left hip: Secondary | ICD-10-CM | POA: Diagnosis not present

## 2016-09-23 ENCOUNTER — Other Ambulatory Visit: Payer: Self-pay | Admitting: Internal Medicine

## 2016-09-24 DIAGNOSIS — H1013 Acute atopic conjunctivitis, bilateral: Secondary | ICD-10-CM | POA: Diagnosis not present

## 2016-10-01 ENCOUNTER — Other Ambulatory Visit: Payer: Self-pay | Admitting: Physical Medicine and Rehabilitation

## 2016-10-01 DIAGNOSIS — M5417 Radiculopathy, lumbosacral region: Secondary | ICD-10-CM | POA: Diagnosis not present

## 2016-10-01 DIAGNOSIS — Z79891 Long term (current) use of opiate analgesic: Secondary | ICD-10-CM | POA: Diagnosis not present

## 2016-10-01 DIAGNOSIS — M47812 Spondylosis without myelopathy or radiculopathy, cervical region: Secondary | ICD-10-CM | POA: Diagnosis not present

## 2016-10-01 DIAGNOSIS — I96 Gangrene, not elsewhere classified: Secondary | ICD-10-CM

## 2016-10-01 DIAGNOSIS — G894 Chronic pain syndrome: Secondary | ICD-10-CM | POA: Diagnosis not present

## 2016-10-01 DIAGNOSIS — M62830 Muscle spasm of back: Secondary | ICD-10-CM | POA: Diagnosis not present

## 2016-10-01 DIAGNOSIS — M47816 Spondylosis without myelopathy or radiculopathy, lumbar region: Secondary | ICD-10-CM | POA: Diagnosis not present

## 2016-10-12 DIAGNOSIS — F3341 Major depressive disorder, recurrent, in partial remission: Secondary | ICD-10-CM | POA: Insufficient documentation

## 2016-10-12 NOTE — Progress Notes (Signed)
Assessment and Plan:   Essential hyertension - continue medications, DASH diet, exercise and monitor at home. Call if greater than 130/80.  -     CBC with Differential/Platelet -     BASIC METABOLIC PANEL WITH GFR -     Hepatic function panel -     TSH  Chronic obstructive pulmonary disease, unspecified COPD type ([place]) Advised to stop smoking, continue meds.   Mixed hyperlipidemia -continue medications, check lipids, decrease fatty foods, increase activity.  -     Lipid panel  Medication management -     Magnesium  Memory issues Need CPAP, taper off topamax, normal neuro at this time patient to go to ER if there is weakness, thunderclap headache, visual changes, or any concerning factors  Continue diet and meds as discussed. Further disposition pending results of labs. Discussed med's effects and SE's.    HPI 61 y.o. female  presents for 3 month follow up with hypertension, hyperlipidemia, diabetes and vitamin D deficiency.  She does still see pain management for her back pain.  They are prescribing her pain medications.  She has been having bilateral hip pain, had Xray that showed possible AVN bilateral going to get MRI on the 5th. She has been having bilateral hand pain in the AM.  She is still smoking, not ready to quit at this time. Having some right leg pain with certain positions, cramping. No pain with walking. Had one episode of falling, after taking all meds and then getting up for the dogs, no LOC, no other issues. Complain of on going memory issues, on topamax and several sedatined meds, has OSa and not on CPAP, normal CT scan head 04/2016, showing microvascular changes   Her blood pressure has been controlled at home, today their BP is BP: 122/80.She does not workout. She denies chest pain, shortness of breath, dizziness.   She is on cholesterol medication and denies myalgias. Her cholesterol is at goal. The cholesterol was:  07/05/2016: Cholesterol 146; HDL 56; LDL  Cholesterol 65; Triglycerides 125   She has been working on diet and exercise for prediabetes, she states she has not been eating well, eating chocolate bars, she is on bASA, she is on ACE/ARB, and denies  foot ulcerations, hyperglycemia, hypoglycemia , increased appetite, nausea, paresthesia of the feet, polydipsia, polyuria, visual disturbances, vomiting and weight loss. Last A1C was: 07/05/2016: Hgb A1c MFr Bld 5.5   Patient is on Vitamin D supplement, has not been taking the vitamin D 07/05/2016: Vit D, 25-Hydroxy 44  BMI is Body mass index is 26.54 kg/m., she is working on diet and exercise. She has had CPAP in the past, 10 years ago, never on it now.  Wt Readings from Last 3 Encounters:  10/13/16 154 lb 9.6 oz (70.1 kg)  07/05/16 150 lb 9.6 oz (68.3 kg)  03/24/16 147 lb 3.2 oz (66.8 kg)     Current Medications:  Current Outpatient Prescriptions on File Prior to Visit  Medication Sig Dispense Refill  . albuterol (PROVENTIL HFA;VENTOLIN HFA) 108 (90 BASE) MCG/ACT inhaler Inhale 2 puffs into the lungs every 6 (six) hours as needed for wheezing or shortness of breath. 1 Inhaler 2  . cholecalciferol (VITAMIN D) 1000 UNITS tablet Take 5,000 Units by mouth daily.    . clobetasol (TEMOVATE) 0.05 % external solution apply 1 APPLICATION to affected area twice a day 50 mL 0  . FLUoxetine (PROZAC) 40 MG capsule take 1 capsule by mouth twice a day 180 capsule 1  .  gabapentin (NEURONTIN) 300 MG capsule Take 300 mg by mouth 2 (two) times daily as needed (pain). Reported on 09/04/2015  0  . oxyCODONE-acetaminophen (PERCOCET) 10-325 MG per tablet Take 1 tablet by mouth every 6 (six) hours as needed for pain.    . prazosin (MINIPRESS) 1 MG capsule take 1 to 2 capsules by mouth every evening for DREAMS 90 capsule 1  . simvastatin (ZOCOR) 40 MG tablet take 1 tablet by mouth at bedtime 90 tablet 1  . tiZANidine (ZANAFLEX) 2 MG tablet Take 2 mg by mouth every 6 (six) hours as needed for muscle spasms.    Marland Kitchen  topiramate (TOPAMAX) 25 MG tablet Take 50 mg by mouth at bedtime.      No current facility-administered medications on file prior to visit.    Medical History:  Past Medical History:  Diagnosis Date  . Allergy   . Arthritis   . AVN (avascular necrosis of bone) (HCC)    Left shoulder  . COPD (chronic obstructive pulmonary disease) ([place])   . Depression   . Diabetes mellitus without complication (HCC)    Pre-diabetes.   Marland Kitchen GERD (gastroesophageal reflux disease)   . Hyperlipidemia   . Hypertension   . Vitamin D deficiency    Allergies:  Allergies  Allergen Reactions  . Augmentin [Amoxicillin-Pot Clavulanate]   . Codeine Nausea And Vomiting  . Lipitor [Atorvastatin]     Leg pain  . Wellbutrin [Bupropion]     Pt does not recall reaction   . Zoloft [Sertraline Hcl]     Pt does not recall reaction      Review of Systems:  Review of Systems  Constitutional: Negative for chills, fever and malaise/fatigue.  HENT: Negative for congestion, ear pain and sore throat.   Eyes: Negative.   Respiratory: Negative for cough, shortness of breath and wheezing.   Cardiovascular: Negative for chest pain, palpitations and leg swelling.  Gastrointestinal: Negative for abdominal pain, blood in stool, constipation, diarrhea, heartburn and melena.  Genitourinary: Negative.   Skin: Negative.   Neurological: Negative for dizziness, sensory change, loss of consciousness and headaches.  Psychiatric/Behavioral: Negative for depression. The patient is not nervous/anxious and does not have insomnia.     Family history- Review and unchanged  Social history- Review and unchanged  Physical Exam: BP 122/80   Pulse 74   Temp (!) 97.3 F (36.3 C)   Resp 14   Ht 5\' 4"  (1.626 m)   Wt 154 lb 9.6 oz (70.1 kg)   SpO2 94%   BMI 26.54 kg/m  Wt Readings from Last 3 Encounters:  10/13/16 154 lb 9.6 oz (70.1 kg)  07/05/16 150 lb 9.6 oz (68.3 kg)  03/24/16 147 lb 3.2 oz (66.8 kg)   General Appearance:  Well nourished well developed, non-toxic appearing, in no apparent distress. Eyes: PERRLA, EOMs, conjunctiva no swelling or erythema ENT/Mouth: Ear canals clear with no erythema, swelling, or discharge.  TMs normal bilaterally, oropharynx clear, moist, with no exudate.   Neck: Supple, thyroid normal, no JVD, no cervical adenopathy.  Respiratory: Respiratory effort normal, breath sounds clear A&P, inspiratory wheeze no rhonchi or rales noted.  No retractions, no accessory muscle usage Cardio: RRR with no MRGs. No noted edema.  Abdomen: Soft, + BS.  Non tender, no guarding, rebound, hernias, masses. Musculoskeletal: Full ROM, 5/5 strength, Normal gait Skin: Warm, dry without rashes, lesions, ecchymosis.  Neuro: Awake and oriented X 3, Cranial nerves intact. No cerebellar symptoms.  Psych: normal affect, Insight and  Judgment appropriate.    Vicie Mutters, PA-C 11:54 AM [facility]

## 2016-10-13 ENCOUNTER — Ambulatory Visit (INDEPENDENT_AMBULATORY_CARE_PROVIDER_SITE_OTHER): Payer: Medicare Other | Admitting: Physician Assistant

## 2016-10-13 ENCOUNTER — Encounter: Payer: Self-pay | Admitting: Physician Assistant

## 2016-10-13 VITALS — BP 122/80 | HR 74 | Temp 97.3°F | Resp 14 | Ht 64.0 in | Wt 154.6 lb

## 2016-10-13 DIAGNOSIS — J449 Chronic obstructive pulmonary disease, unspecified: Secondary | ICD-10-CM | POA: Diagnosis not present

## 2016-10-13 DIAGNOSIS — I10 Essential (primary) hypertension: Secondary | ICD-10-CM | POA: Diagnosis not present

## 2016-10-13 DIAGNOSIS — G4733 Obstructive sleep apnea (adult) (pediatric): Secondary | ICD-10-CM

## 2016-10-13 DIAGNOSIS — R7309 Other abnormal glucose: Secondary | ICD-10-CM | POA: Diagnosis not present

## 2016-10-13 DIAGNOSIS — E782 Mixed hyperlipidemia: Secondary | ICD-10-CM | POA: Diagnosis not present

## 2016-10-13 DIAGNOSIS — F17200 Nicotine dependence, unspecified, uncomplicated: Secondary | ICD-10-CM

## 2016-10-13 DIAGNOSIS — Z79899 Other long term (current) drug therapy: Secondary | ICD-10-CM

## 2016-10-13 NOTE — Patient Instructions (Addendum)

## 2016-10-14 LAB — HEMOGLOBIN A1C
EAG (MMOL/L): 6.5 (calc)
Hgb A1c MFr Bld: 5.7 % of total Hgb — ABNORMAL HIGH (ref <5.7)
Mean Plasma Glucose: 117 (calc)

## 2016-10-14 LAB — CBC WITH DIFFERENTIAL/PLATELET
BASOS ABS: 20 {cells}/uL (ref 0–200)
Basophils Relative: 0.3 %
EOS ABS: 161 {cells}/uL (ref 15–500)
EOS PCT: 2.4 %
HEMATOCRIT: 39.1 % (ref 35.0–45.0)
HEMOGLOBIN: 13 g/dL (ref 11.7–15.5)
LYMPHS ABS: 2566 {cells}/uL (ref 850–3900)
MCH: 30.4 pg (ref 27.0–33.0)
MCHC: 33.2 g/dL (ref 32.0–36.0)
MCV: 91.6 fL (ref 80.0–100.0)
MPV: 10.2 fL (ref 7.5–12.5)
Monocytes Relative: 6.9 %
NEUTROS ABS: 3491 {cells}/uL (ref 1500–7800)
NEUTROS PCT: 52.1 %
Platelets: 295 10*3/uL (ref 140–400)
RBC: 4.27 10*6/uL (ref 3.80–5.10)
RDW: 12.3 % (ref 11.0–15.0)
Total Lymphocyte: 38.3 %
WBC: 6.7 10*3/uL (ref 3.8–10.8)
WBCMIX: 462 {cells}/uL (ref 200–950)

## 2016-10-14 LAB — BASIC METABOLIC PANEL WITH GFR
BUN: 12 mg/dL (ref 7–25)
CO2: 24 mmol/L (ref 20–32)
CREATININE: 0.9 mg/dL (ref 0.50–0.99)
Calcium: 9.2 mg/dL (ref 8.6–10.4)
Chloride: 110 mmol/L (ref 98–110)
GFR, EST NON AFRICAN AMERICAN: 69 mL/min/{1.73_m2} (ref >=60)
GFR, Est African American: 80 mL/min/{1.73_m2} (ref >=60)
Glucose, Bld: 91 mg/dL (ref 65–99)
Potassium: 4.8 mmol/L (ref 3.5–5.3)
SODIUM: 140 mmol/L (ref 135–146)

## 2016-10-14 LAB — LIPID PANEL
Cholesterol: 199 mg/dL (ref <200)
HDL: 67 mg/dL (ref >50)
LDL Cholesterol (Calc): 107 mg/dL (calc) — ABNORMAL HIGH
NON-HDL CHOLESTEROL (CALC): 132 mg/dL — AB (ref <130)
TRIGLYCERIDES: 134 mg/dL (ref <150)
Total CHOL/HDL Ratio: 3 (calc) (ref <5.0)

## 2016-10-14 LAB — MAGNESIUM: MAGNESIUM: 2.2 mg/dL (ref 1.5–2.5)

## 2016-10-14 LAB — HEPATIC FUNCTION PANEL
AG Ratio: 1.6 (calc) (ref 1.0–2.5)
ALKALINE PHOSPHATASE (APISO): 87 U/L (ref 33–130)
ALT: 21 U/L (ref 6–29)
AST: 21 U/L (ref 10–35)
Albumin: 4.1 g/dL (ref 3.6–5.1)
BILIRUBIN INDIRECT: 0.2 mg/dL (ref 0.2–1.2)
Bilirubin, Direct: 0.1 mg/dL (ref 0.0–0.2)
Globulin: 2.6 g/dL (calc) (ref 1.9–3.7)
Total Bilirubin: 0.3 mg/dL (ref 0.2–1.2)
Total Protein: 6.7 g/dL (ref 6.1–8.1)

## 2016-10-14 LAB — TSH: TSH: 1.03 mIU/L (ref 0.40–4.50)

## 2016-10-14 NOTE — Progress Notes (Signed)
Pt aware of lab results & voiced understanding of those results.

## 2016-10-20 ENCOUNTER — Ambulatory Visit
Admission: RE | Admit: 2016-10-20 | Discharge: 2016-10-20 | Disposition: A | Discharge status: Home / Self Care | Payer: Medicare Other | Source: Ambulatory Visit | Attending: Physical Medicine and Rehabilitation | Admitting: Physical Medicine and Rehabilitation

## 2016-10-20 ENCOUNTER — Telehealth: Payer: Self-pay | Admitting: Internal Medicine

## 2016-10-20 DIAGNOSIS — I96 Gangrene, not elsewhere classified: Secondary | ICD-10-CM

## 2016-10-20 DIAGNOSIS — M7061 Trochanteric bursitis, right hip: Secondary | ICD-10-CM | POA: Diagnosis not present

## 2016-10-20 DIAGNOSIS — M7062 Trochanteric bursitis, left hip: Secondary | ICD-10-CM | POA: Diagnosis not present

## 2016-10-20 NOTE — Telephone Encounter (Signed)
Patient brought CPAP machine to office. No longer uses. She requests we dispose.

## 2016-10-25 ENCOUNTER — Other Ambulatory Visit: Payer: Self-pay | Admitting: Physician Assistant

## 2016-10-25 DIAGNOSIS — R413 Other amnesia: Secondary | ICD-10-CM

## 2016-10-29 ENCOUNTER — Other Ambulatory Visit: Payer: Self-pay | Admitting: Physician Assistant

## 2016-10-29 DIAGNOSIS — M47816 Spondylosis without myelopathy or radiculopathy, lumbar region: Secondary | ICD-10-CM | POA: Diagnosis not present

## 2016-10-29 DIAGNOSIS — G894 Chronic pain syndrome: Secondary | ICD-10-CM | POA: Diagnosis not present

## 2016-10-29 DIAGNOSIS — M47812 Spondylosis without myelopathy or radiculopathy, cervical region: Secondary | ICD-10-CM | POA: Diagnosis not present

## 2016-10-29 DIAGNOSIS — M62830 Muscle spasm of back: Secondary | ICD-10-CM | POA: Diagnosis not present

## 2016-11-26 DIAGNOSIS — M47812 Spondylosis without myelopathy or radiculopathy, cervical region: Secondary | ICD-10-CM | POA: Diagnosis not present

## 2016-11-26 DIAGNOSIS — G894 Chronic pain syndrome: Secondary | ICD-10-CM | POA: Diagnosis not present

## 2016-11-26 DIAGNOSIS — M62830 Muscle spasm of back: Secondary | ICD-10-CM | POA: Diagnosis not present

## 2016-11-26 DIAGNOSIS — M47816 Spondylosis without myelopathy or radiculopathy, lumbar region: Secondary | ICD-10-CM | POA: Diagnosis not present

## 2016-12-23 ENCOUNTER — Encounter (INDEPENDENT_AMBULATORY_CARE_PROVIDER_SITE_OTHER): Payer: Self-pay

## 2016-12-23 ENCOUNTER — Encounter: Payer: Self-pay | Admitting: Neurology

## 2016-12-23 ENCOUNTER — Ambulatory Visit: Payer: Medicare Other | Admitting: Neurology

## 2016-12-23 VITALS — BP 115/79 | HR 93 | Ht 64.0 in | Wt 151.5 lb

## 2016-12-23 DIAGNOSIS — R413 Other amnesia: Secondary | ICD-10-CM | POA: Diagnosis not present

## 2016-12-23 DIAGNOSIS — E538 Deficiency of other specified B group vitamins: Secondary | ICD-10-CM

## 2016-12-23 HISTORY — DX: Other amnesia: R41.3

## 2016-12-23 NOTE — Patient Instructions (Signed)
   WE will check MRI of the brain and get some blood work today. We will follow the memory over time.

## 2016-12-23 NOTE — Progress Notes (Signed)
Reason for visit: Memory disturbance  Referring physician: Dr. Edrick Oh is a 62 y.o. female  History of present illness:  Ms. 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  The patient is followed through a pain center, she is on chronic opioid therapy, Topamax, and gabapentin.  She has reported slight memory troubles over the last 3 or 4 years.  She believes that this is gradually worsening over time.  The patient may have some trouble with remembering names for people, she will oftentimes go into her room and cannot remember why she went there.  She is taking more notes to keep track of things.  She still operates a motor vehicle without difficulty, she is able to keep up with medications and appointments and she is able to do her finances without difficulty.  The patient at times may not sleep well at night, at other times she may sleep better.  She does have a history of sleep apnea, she is not on CPAP currently.  The patient is now tapering off of Topamax in hopes of improving cognitive functioning.  She does report some numbness and sensation of swelling in the hands, she has some numbness in the legs and feet as well.  The patient has had lumbosacral spine surgery on 2 occasions.  The patient does note some problems with balance, she denies any recent falls.  She denies issues controlling the bowels or the bladder.  She will occasionally will have ice pick pains in the head, she has had a prior history of migraine in the past.  She is sent to this office for an evaluation.  Past Medical History:  Diagnosis Date  . Allergy   . Arthritis   . AVN (avascular necrosis of bone) (HCC)    Left shoulder  . COPD (chronic obstructive pulmonary disease) ([place])   . Depression   . Diabetes mellitus without complication (HCC)    Pre-diabetes.   Marland Kitchen GERD (gastroesophageal  reflux disease)   . Hyperlipidemia   . Hypertension   . Vitamin D deficiency     Past Surgical History:  Procedure Laterality Date  . ANKLE SURGERY Right   . CATARACT EXTRACTION Bilateral   . SPINE SURGERY     lumbar  . TONSILLECTOMY AND ADENOIDECTOMY      Family History  Problem Relation Age of Onset  . Hypertension Mother   . COPD Mother   . Stroke Mother   . Cancer Mother        breast  . Heart disease Father        Age 43, MI    Social history:  reports that she has been smoking.  She has a 30.00 pack-year smoking history. she has never used smokeless tobacco. She reports that she does not drink alcohol or use drugs.  Medications:  Prior to Admission medications   Medication Sig Start Date End Date Taking? Authorizing Provider  baclofen (LIORESAL) 10 MG tablet Take 10 mg 3 (three) times daily as needed by mouth for muscle spasms.   Yes [provider]  cholecalciferol (VITAMIN D) 1000 UNITS tablet Take 5,000 Units by mouth daily.   Yes [provider]  FLUoxetine (PROZAC) 40 MG capsule take 1 capsule by mouth twice a day 10/29/16  Yes Unk Pinto, MD  gabapentin (NEURONTIN) 300 MG capsule Take 300 mg 3 (three) times daily  by mouth. Reported on 09/04/2015 04/10/15  Yes [provider]  ibuprofen (ADVIL,MOTRIN) 200 MG tablet Take 200 mg every 6 (six) hours as needed by mouth.   Yes [provider]  oxyCODONE-acetaminophen (PERCOCET) 10-325 MG per tablet Take 1 tablet by mouth every 6 (six) hours as needed for pain.   Yes [provider]  simvastatin (ZOCOR) 40 MG tablet take 1 tablet by mouth at bedtime 09/23/16  Yes Unk Pinto, MD  topiramate (TOPAMAX) 25 MG tablet Take 25 mg at bedtime by mouth.    Yes [provider]  clobetasol (TEMOVATE) 0.05 % external solution apply 1 APPLICATION to affected area twice a day Patient not taking: Reported on 12/23/2016 03/10/14   Vicie Mutters, PA-C      Allergies    Allergen Reactions  . Augmentin [Amoxicillin-Pot Clavulanate]   . Codeine Nausea And Vomiting  . Lipitor [Atorvastatin]     Leg pain  . Wellbutrin [Bupropion]     Pt does not recall reaction   . Zoloft [Sertraline Hcl]     Pt does not recall reaction     ROS:  Out of a complete 14 system review of symptoms, the patient complains only of the following symptoms, and all other reviewed systems are negative.    Chest pain Shortness of breath, cough, snoring Joint pain, muscle cramps, aching muscles Allergies, runny nose Memory loss, numbness, weakness Depression, anxiety, decreased energy, disinterest in activities   Blood pressure 115/79, pulse 93, height 5\' 4"  (1.626 m), weight 151 lb 8 oz (68.7 kg).  Physical Exam  General: The patient is alert and cooperative at the time of the examination.  Eyes: Pupils are equal, round, and reactive to light. Discs are flat bilaterally.  Neck: The neck is supple, no carotid bruits are noted.  Respiratory: The respiratory examination is clear.  Cardiovascular: The cardiovascular examination reveals a regular rate and rhythm, no obvious murmurs or rubs are noted.  Skin: Extremities are without significant edema.  Neurologic Exam  Mental status: The patient is alert and oriented x 3 at the time of the examination. The patient has apparent normal recent and remote memory, with an apparently normal attention span and concentration ability.  Mini-Mental status examination done today shows a total score of 30/30.  Cranial nerves: Facial symmetry is present. There is good sensation of the face to pinprick and soft touch bilaterally. The strength of the facial muscles and the muscles to head turning and shoulder shrug are normal bilaterally. Speech is well enunciated, no aphasia or dysarthria is noted. Extraocular movements are full. Visual fields are full. The tongue is midline, and the patient has symmetric elevation of the soft palate. No  obvious hearing deficits are noted.  Motor: The motor testing reveals 5 over 5 strength of all 4 extremities. Good symmetric motor tone is noted throughout.  Sensory: Sensory testing is intact to pinprick, soft touch, vibration sensation, and position sense on all 4 extremities. No evidence of extinction is noted.  Coordination: Cerebellar testing reveals good finger-nose-finger and heel-to-shin bilaterally.  Gait and station: Gait is normal. Tandem gait is normal. Romberg is negative. No drift is seen.  Reflexes: Deep tendon reflexes are symmetric and normal bilaterally. Toes are downgoing bilaterally.   CT head 04/28/15:  IMPRESSION: 1. No evidence of traumatic intracranial injury or fracture. 2. Large soft tissue laceration overlying the frontal calvarium, extending to the calvarium. 3. Mild small vessel ischemic microangiopathy.  * CT scan images were reviewed online. I  agree with the written report.   Assessment/Plan:  1.  Mild memory disturbance  2.  Chronic pain syndrome  The patient reports a mild memory disturbance over the last several years, she believes that this is gradually worsening over time.  The patient is tapering off of Topamax which hopefully will help some of her cognitive functioning.  She has a chronic pain syndrome and she has an irregular sleep pattern.  The use of opiate medications and gabapentin may also have an impact on cognitive functioning.  He also has a history of untreated sleep apnea.  The patient will be set up for blood work today, she will have MRI of the brain done.  She will follow-up in 6 months, will follow the memory issues over time.  Jill Alexanders MD 12/23/2016 9:52 AM  [facility] [address] [place], [place] [address]  Phone [phone] Fax [phone]

## 2016-12-24 ENCOUNTER — Telehealth: Payer: Self-pay | Admitting: *Deleted

## 2016-12-24 DIAGNOSIS — M47816 Spondylosis without myelopathy or radiculopathy, lumbar region: Secondary | ICD-10-CM | POA: Diagnosis not present

## 2016-12-24 DIAGNOSIS — M47812 Spondylosis without myelopathy or radiculopathy, cervical region: Secondary | ICD-10-CM | POA: Diagnosis not present

## 2016-12-24 DIAGNOSIS — M62830 Muscle spasm of back: Secondary | ICD-10-CM | POA: Diagnosis not present

## 2016-12-24 DIAGNOSIS — G894 Chronic pain syndrome: Secondary | ICD-10-CM | POA: Diagnosis not present

## 2016-12-24 LAB — HIV ANTIBODY (ROUTINE TESTING W REFLEX): HIV Screen 4th Generation wRfx: NONREACTIVE

## 2016-12-24 LAB — VITAMIN B12: VITAMIN B 12: 406 pg/mL (ref 232–1245)

## 2016-12-24 LAB — SEDIMENTATION RATE: SED RATE: 11 mm/h (ref 0–40)

## 2016-12-24 LAB — RPR: RPR: NONREACTIVE

## 2016-12-24 NOTE — Telephone Encounter (Signed)
-----   Message from Kathrynn Ducking, MD sent at 12/24/2016  7:16 AM EST -----  The blood work results are unremarkable. Please call the patient.  ----- Message ----- From: Lavone Neri Lab Results In Sent: 12/24/2016   5:42 AM To: Kathrynn Ducking, MD

## 2016-12-24 NOTE — Telephone Encounter (Signed)
Called and spoke with patient about unremarkable labs per CW,MD note.  Advised someone should be contacting her to schedule MRI once approved via insurance. If she does not hear about scheduled, asked her to call me back.

## 2017-01-02 ENCOUNTER — Ambulatory Visit
Admission: RE | Admit: 2017-01-02 | Discharge: 2017-01-02 | Disposition: A | Discharge status: Home / Self Care | Payer: Medicare Other | Source: Ambulatory Visit | Attending: Neurology | Admitting: Neurology

## 2017-01-02 DIAGNOSIS — R413 Other amnesia: Secondary | ICD-10-CM

## 2017-01-03 ENCOUNTER — Telehealth: Payer: Self-pay | Admitting: Neurology

## 2017-01-03 NOTE — Telephone Encounter (Signed)
I called the patient.  The MRI of the brain is relatively unremarkable.  The memory issue will be followed over time, the patient is coming down off of the Topamax and possibly the gabapentin to see if the memory and concentration problems improve.   MRI brain 01/02/17:  IMPRESSION:   Mild periventricular and subcortical chronic small vessel ischemic disease.   No acute findings.

## 2017-01-04 DIAGNOSIS — M47812 Spondylosis without myelopathy or radiculopathy, cervical region: Secondary | ICD-10-CM | POA: Diagnosis not present

## 2017-01-04 DIAGNOSIS — M62830 Muscle spasm of back: Secondary | ICD-10-CM | POA: Diagnosis not present

## 2017-01-04 DIAGNOSIS — G894 Chronic pain syndrome: Secondary | ICD-10-CM | POA: Diagnosis not present

## 2017-01-04 DIAGNOSIS — M47816 Spondylosis without myelopathy or radiculopathy, lumbar region: Secondary | ICD-10-CM | POA: Diagnosis not present

## 2017-01-18 NOTE — Patient Instructions (Signed)

## 2017-01-18 NOTE — Progress Notes (Signed)
This very nice 61 y.o. single WF presents for 6 month follow up with Hypertension, Hyperlipidemia, Pre-Diabetes and Vitamin D Deficiency.  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     The patient has has Chronic LBP consequent of Lumbar DDD &  undergoing  surg x 2 in 2004 and is on SS Disability for chronic pain and is followed by Dr Ace Gins for pain management.     Patient has hx/o HTN (1998) treated in the past and not followed with expectant monitoring. Today's BP is at goal - 124/70. Patient has had no complaints of any cardiac type chest pain, palpitations, dyspnea / orthopnea / PND, dizziness, claudication, or dependent edema.     Hyperlipidemia is controlled with diet & meds. Patient denies myalgias or other med SE's. Last Lipids were  Lab Results  Component Value Date   CHOL 199 10/13/2016   HDL 67 10/13/2016   LDLCALC 65 07/05/2016   TRIG 134 10/13/2016   CHOLHDL 3.0 10/13/2016      Also, the patient has history of PreDiabetes (A1c 5.8% / 2010)  and has had no symptoms of reactive hypoglycemia, diabetic polys, paresthesias or visual blurring.  Last A1c was still not at goal: Lab Results  Component Value Date   HGBA1C 5.7 (H) 10/13/2016      Further, the patient also has history of Vitamin D Deficiency ("20"/2008) and supplements vitamin D without any suspected side-effects. Last vitamin D was still not at goal: Lab Results  Component Value Date   VD25OH 44 07/05/2016   Current Outpatient Medications on File Prior to Visit  Medication Sig  . baclofen  10 MG tablet Take 10 mg 3 (three) times daily as needed by mouth for muscle spasms.   Prozac 40 mg  Takes 1 cap daily   Vit D 5,000 u Takes 2 tabs = 10,000 u/da  . ibuprofen  200 MG tablet Take 200 mg every  6 (six) hours as needed by mouth.  Marland Kitchen PERCOCET 10-325 MG per tablet Take 1 tablet by mouth every 6 (six) hours as needed for pain.  . simvastatin ) 40 MG tablet take 1 tablet by mouth at bedtime   Allergies  Allergen Reactions  . Augmentin [Amoxicillin-Pot Clavulanate]   . Codeine Nausea And Vomiting  . Lipitor [Atorvastatin]     Leg pain  . Wellbutrin [Bupropion]     Pt does not recall reaction   . Zoloft [Sertraline Hcl]     Pt does not recall reaction    PMHx:   Past Medical History:  Diagnosis Date  . Allergy   . Arthritis   . AVN (avascular necrosis of bone) (HCC)    Left shoulder  . COPD (chronic obstructive pulmonary disease) ([place])   . Depression   . Diabetes mellitus without complication (HCC)    Pre-diabetes.   Marland Kitchen GERD (gastroesophageal reflux disease)   . Hyperlipidemia   . Hypertension   . Memory disorder 12/23/2016  . Vitamin D deficiency    Immunization History  Administered Date(s) Administered  . Td 01/19/2004, 06/05/2015   Past Surgical History:  Procedure Laterality Date  . ANKLE SURGERY Right   . CATARACT EXTRACTION Bilateral   . SPINE SURGERY  lumbar  . TONSILLECTOMY AND ADENOIDECTOMY     FHx:    Reviewed / unchanged  SHx:    Reviewed / unchanged  Systems Review:  Constitutional: Denies fever, chills, wt changes, headaches, insomnia, fatigue, night sweats, change in appetite. Eyes: Denies redness, blurred vision, diplopia, discharge, itchy, watery eyes.  ENT: Denies discharge, congestion, post nasal drip, epistaxis, sore throat, earache, hearing loss, dental pain, tinnitus, vertigo, sinus pain, snoring.  CV: Denies chest pain, palpitations, irregular heartbeat, syncope, dyspnea, diaphoresis, orthopnea, PND, claudication or edema. Respiratory: denies cough, dyspnea, DOE, pleurisy, hoarseness, laryngitis, wheezing.  Gastrointestinal: Denies dysphagia, odynophagia, heartburn, reflux, water brash, abdominal pain or cramps, nausea, vomiting,  bloating, diarrhea, constipation, hematemesis, melena, hematochezia  or hemorrhoids. Genitourinary: Denies dysuria, frequency, urgency, nocturia, hesitancy, discharge, hematuria or flank pain. Musculoskeletal: Denies arthralgias, myalgias, stiffness, jt. swelling, pain, limping or strain/sprain.  Skin: Denies pruritus, rash, hives, warts, acne, eczema or change in skin lesion(s). Neuro: No weakness, tremor, incoordination, spasms, paresthesia or pain. Psychiatric: Denies confusion, memory loss or sensory loss. Endo: Denies change in weight, skin or hair change.  Heme/Lymph: No excessive bleeding, bruising or enlarged lymph nodes.  Physical Exam  BP 124/70   Pulse 60   Temp 97.6 F (36.4 C)   Resp 18   Ht 5\' 4"  (1.626 m)   Wt 154 lb 3.2 oz (69.9 kg)   HC 64" (162.6 cm)   BMI 26.47 kg/m   Appears over nourished, well groomed  and in no distress.  Eyes: PERRLA, EOMs, conjunctiva no swelling or erythema. Sinuses: No frontal/maxillary tenderness ENT/Mouth: EAC's clear, TM's nl w/o erythema, bulging. Nares clear w/o erythema, swelling, exudates. Oropharynx clear without erythema or exudates. Oral hygiene is good. Tongue normal, non obstructing. Hearing intact.  Neck: Supple. Thyroid nl. Car 2+/2+ without bruits, nodes or JVD. Chest: Respirations nl with BS clear & equal w/o rales, rhonchi, wheezing or stridor.  Cor: Heart sounds normal w/ regular rate and rhythm without sig. murmurs, gallops, clicks or rubs. Peripheral pulses normal and equal  without edema.  Abdomen: Soft & bowel sounds normal. Non-tender w/o guarding, rebound, hernias, masses or organomegaly.  Lymphatics: Unremarkable.  Musculoskeletal: Full ROM all peripheral extremities, joint stability, 5/5 strength and normal gait.  Skin: Warm, dry without exposed rashes, lesions or ecchymosis apparent.  Neuro: Cranial nerves intact, reflexes equal bilaterally. Sensory-motor testing grossly intact. Tendon reflexes grossly intact.    Pysch: Alert & oriented x 3.  Insight and judgement nl & appropriate. No ideations.  Assessment and Plan:  1. Essential hypertension  - Recommend monitor blood pressure at home.  - Continue DASH diet. Reminder to go to the ER if any CP,  SOB, nausea, dizziness, severe HA, changes vision/speech.  - CBC with Differential/Platelet - BASIC METABOLIC PANEL WITH GFR - Magnesium - TSH  2. Hyperlipidemia, mixed  - Continue diet/meds, exercise,& lifestyle modifications.  - Continue monitor periodic cholesterol/liver & renal functions Hepatic function panel  - Lipid panel - TSH  3. Prediabetes  - Continue diet, exercise, lifestyle modifications.  - Monitor appropriate labs.  - Hemoglobin A1c - Insulin, random  4. Vitamin D deficiency  - Continue supplementation.  - VITAMIN D 25 Hydroxy  5. Abnormal glucose  - Hemoglobin A1c - Insulin, random  6. Medication management  - CBC with Differential/Platelet - BASIC METABOLIC PANEL WITH GFR - Hepatic function panel - Magnesium - Lipid panel - TSH - Hemoglobin A1c - Insulin, random - VITAMIN D 25 Hydroxy  Discussed  regular exercise, BP monitoring, weight control to achieve/maintain BMI less than 25 and discussed med and SE's. Recommended labs to assess and monitor clinical status with further disposition pending results of labs. Over 30 minutes of exam, counseling, chart review was performed. ROV 3 months for Liver /Liver Check

## 2017-01-19 ENCOUNTER — Encounter: Payer: Self-pay | Admitting: Internal Medicine

## 2017-01-19 ENCOUNTER — Ambulatory Visit (INDEPENDENT_AMBULATORY_CARE_PROVIDER_SITE_OTHER): Payer: Medicare Other | Admitting: Internal Medicine

## 2017-01-19 VITALS — BP 124/70 | HR 60 | Temp 97.6°F | Resp 18 | Ht 64.0 in | Wt 154.2 lb

## 2017-01-19 DIAGNOSIS — R7309 Other abnormal glucose: Secondary | ICD-10-CM

## 2017-01-19 DIAGNOSIS — E782 Mixed hyperlipidemia: Secondary | ICD-10-CM | POA: Diagnosis not present

## 2017-01-19 DIAGNOSIS — R7303 Prediabetes: Secondary | ICD-10-CM | POA: Diagnosis not present

## 2017-01-19 DIAGNOSIS — E559 Vitamin D deficiency, unspecified: Secondary | ICD-10-CM

## 2017-01-19 DIAGNOSIS — Z79899 Other long term (current) drug therapy: Secondary | ICD-10-CM

## 2017-01-19 DIAGNOSIS — I10 Essential (primary) hypertension: Secondary | ICD-10-CM

## 2017-01-19 MED ORDER — FLUOXETINE HCL 40 MG PO CAPS: ORAL_CAPSULE | ORAL | 1 refills | Status: DC

## 2017-01-19 MED ORDER — CHOLECALCIFEROL 125 MCG (5000 UT) PO TABS: ORAL_TABLET | ORAL | Status: AC

## 2017-01-20 LAB — CBC WITH DIFFERENTIAL/PLATELET
BASOS PCT: 0.5 %
Basophils Absolute: 37 cells/uL (ref 0–200)
EOS ABS: 170 {cells}/uL (ref 15–500)
Eosinophils Relative: 2.3 %
HEMATOCRIT: 40.3 % (ref 35.0–45.0)
HEMOGLOBIN: 13.1 g/dL (ref 11.7–15.5)
LYMPHS ABS: 2057 {cells}/uL (ref 850–3900)
MCH: 30.2 pg (ref 27.0–33.0)
MCHC: 32.5 g/dL (ref 32.0–36.0)
MCV: 92.9 fL (ref 80.0–100.0)
MPV: 10.7 fL (ref 7.5–12.5)
Monocytes Relative: 5.3 %
Neutro Abs: 4743 cells/uL (ref 1500–7800)
Neutrophils Relative %: 64.1 %
Platelets: 301 10*3/uL (ref 140–400)
RBC: 4.34 10*6/uL (ref 3.80–5.10)
RDW: 12.2 % (ref 11.0–15.0)
TOTAL LYMPHOCYTE: 27.8 %
WBC: 7.4 10*3/uL (ref 3.8–10.8)
WBCMIX: 392 {cells}/uL (ref 200–950)

## 2017-01-20 LAB — HEPATIC FUNCTION PANEL
AG Ratio: 1.6 (calc) (ref 1.0–2.5)
ALBUMIN MSPROF: 4.2 g/dL (ref 3.6–5.1)
ALKALINE PHOSPHATASE (APISO): 88 U/L (ref 33–130)
ALT: 21 U/L (ref 6–29)
AST: 22 U/L (ref 10–35)
BILIRUBIN DIRECT: 0.1 mg/dL (ref 0.0–0.2)
BILIRUBIN TOTAL: 0.3 mg/dL (ref 0.2–1.2)
Globulin: 2.6 g/dL (calc) (ref 1.9–3.7)
Indirect Bilirubin: 0.2 mg/dL (calc) (ref 0.2–1.2)
Total Protein: 6.8 g/dL (ref 6.1–8.1)

## 2017-01-20 LAB — TSH: TSH: 2.6 m[IU]/L (ref 0.40–4.50)

## 2017-01-20 LAB — BASIC METABOLIC PANEL WITH GFR
BUN: 14 mg/dL (ref 7–25)
CALCIUM: 9.5 mg/dL (ref 8.6–10.4)
CHLORIDE: 106 mmol/L (ref 98–110)
CO2: 27 mmol/L (ref 20–32)
CREATININE: 0.87 mg/dL (ref 0.50–0.99)
GFR, EST AFRICAN AMERICAN: 83 mL/min/{1.73_m2} (ref >=60)
GFR, EST NON AFRICAN AMERICAN: 72 mL/min/{1.73_m2} (ref >=60)
Glucose, Bld: 97 mg/dL (ref 65–99)
Potassium: 4.5 mmol/L (ref 3.5–5.3)
Sodium: 140 mmol/L (ref 135–146)

## 2017-01-20 LAB — MAGNESIUM: MAGNESIUM: 2.2 mg/dL (ref 1.5–2.5)

## 2017-01-20 LAB — LIPID PANEL
Cholesterol: 169 mg/dL (ref <200)
HDL: 63 mg/dL (ref >50)
LDL Cholesterol (Calc): 83 mg/dL (calc)
NON-HDL CHOLESTEROL (CALC): 106 mg/dL (ref <130)
Total CHOL/HDL Ratio: 2.7 (calc) (ref <5.0)
Triglycerides: 126 mg/dL (ref <150)

## 2017-01-20 LAB — HEMOGLOBIN A1C
EAG (MMOL/L): 6.2 (calc)
Hgb A1c MFr Bld: 5.5 % of total Hgb (ref <5.7)
Mean Plasma Glucose: 111 (calc)

## 2017-01-20 LAB — INSULIN, RANDOM: Insulin: 16.4 u[IU]/mL (ref 2.0–19.6)

## 2017-01-20 LAB — VITAMIN D 25 HYDROXY (VIT D DEFICIENCY, FRACTURES): Vit D, 25-Hydroxy: 37 ng/mL (ref 30–100)

## 2017-01-25 ENCOUNTER — Other Ambulatory Visit: Payer: Self-pay | Admitting: Physical Medicine and Rehabilitation

## 2017-01-25 DIAGNOSIS — M545 Low back pain: Secondary | ICD-10-CM

## 2017-02-03 ENCOUNTER — Ambulatory Visit
Admission: RE | Admit: 2017-02-03 | Discharge: 2017-02-03 | Disposition: A | Discharge status: Home / Self Care | Payer: Medicare Other | Source: Ambulatory Visit | Attending: Physical Medicine and Rehabilitation | Admitting: Physical Medicine and Rehabilitation

## 2017-02-03 DIAGNOSIS — M5126 Other intervertebral disc displacement, lumbar region: Secondary | ICD-10-CM | POA: Diagnosis not present

## 2017-02-03 DIAGNOSIS — M48061 Spinal stenosis, lumbar region without neurogenic claudication: Secondary | ICD-10-CM | POA: Diagnosis not present

## 2017-02-03 DIAGNOSIS — M545 Low back pain: Secondary | ICD-10-CM

## 2017-02-03 MED ORDER — GADOBENATE DIMEGLUMINE 529 MG/ML IV SOLN
14.0000 mL | Freq: Once | INTRAVENOUS | Status: AC | PRN
  Administered 2017-02-03: 14 mL via INTRAVENOUS

## 2017-02-22 DIAGNOSIS — M62830 Muscle spasm of back: Secondary | ICD-10-CM | POA: Diagnosis not present

## 2017-02-22 DIAGNOSIS — M47816 Spondylosis without myelopathy or radiculopathy, lumbar region: Secondary | ICD-10-CM | POA: Diagnosis not present

## 2017-02-22 DIAGNOSIS — Z79891 Long term (current) use of opiate analgesic: Secondary | ICD-10-CM | POA: Diagnosis not present

## 2017-02-22 DIAGNOSIS — G894 Chronic pain syndrome: Secondary | ICD-10-CM | POA: Diagnosis not present

## 2017-02-22 DIAGNOSIS — M47812 Spondylosis without myelopathy or radiculopathy, cervical region: Secondary | ICD-10-CM | POA: Diagnosis not present

## 2017-03-03 DIAGNOSIS — M4807 Spinal stenosis, lumbosacral region: Secondary | ICD-10-CM | POA: Diagnosis not present

## 2017-03-22 DIAGNOSIS — M47816 Spondylosis without myelopathy or radiculopathy, lumbar region: Secondary | ICD-10-CM | POA: Diagnosis not present

## 2017-03-22 DIAGNOSIS — M47812 Spondylosis without myelopathy or radiculopathy, cervical region: Secondary | ICD-10-CM | POA: Diagnosis not present

## 2017-03-22 DIAGNOSIS — G894 Chronic pain syndrome: Secondary | ICD-10-CM | POA: Diagnosis not present

## 2017-03-22 DIAGNOSIS — M62830 Muscle spasm of back: Secondary | ICD-10-CM | POA: Diagnosis not present

## 2017-03-31 DIAGNOSIS — M5417 Radiculopathy, lumbosacral region: Secondary | ICD-10-CM | POA: Diagnosis not present

## 2017-03-31 DIAGNOSIS — G894 Chronic pain syndrome: Secondary | ICD-10-CM | POA: Diagnosis not present

## 2017-04-08 ENCOUNTER — Other Ambulatory Visit: Payer: Self-pay | Admitting: Internal Medicine

## 2017-04-12 ENCOUNTER — Other Ambulatory Visit: Payer: Self-pay | Admitting: Internal Medicine

## 2017-04-19 DIAGNOSIS — M47812 Spondylosis without myelopathy or radiculopathy, cervical region: Secondary | ICD-10-CM | POA: Diagnosis not present

## 2017-04-19 DIAGNOSIS — M47816 Spondylosis without myelopathy or radiculopathy, lumbar region: Secondary | ICD-10-CM | POA: Diagnosis not present

## 2017-04-19 DIAGNOSIS — M62830 Muscle spasm of back: Secondary | ICD-10-CM | POA: Diagnosis not present

## 2017-04-19 DIAGNOSIS — G894 Chronic pain syndrome: Secondary | ICD-10-CM | POA: Diagnosis not present

## 2017-04-20 NOTE — Progress Notes (Signed)
MEDICARE ANNUAL WELLNESS VISIT AND FOLLOW UP Assessment:   Encounter for Medicare annual wellness exam 1 year GET MGM Get colonoscopy Given numbers  Essential hypertension - continue medications, DASH diet, exercise and monitor at home. Call if greater than 130/80.  -     CBC with Differential/Platelet -     BASIC METABOLIC PANEL WITH GFR -     Hepatic function panel -     TSH Tortuous aorta (HCC) Control blood pressure, cholesterol, glucose, increase exercise.  Advised to stop smoking  Preoperative clearance We will send letter to the surgeon for surgical clearance. Patient cleared medically.  Recommendations:  DVT prophylaxis, monitoring of blood sugars and blood pressure post operatively. Patient does have COPD and untreated OSA. Had recent normal echo/cardiolite. Patient will schedule an appointment in the office post operatively for follow up.    Chronic obstructive pulmonary disease, unspecified COPD type ([place]) Advised to stop smoking, CXR, continue meds.   OSA and COPD overlap syndrome ([place]) Get back CPAP  Atherosclerosis of aorta (HCC) Control blood pressure, cholesterol, glucose, increase exercise.   Smokers' cough ([place]) Advised to quit smoking  CKD (chronic kidney disease) stage 2, GFR 60-89 ml/min -     BASIC METABOLIC PANEL WITH GFR  Depression, major, recurrent, in partial remission ([place]) - continue medications, stress management techniques discussed, increase water, good sleep hygiene discussed, increase exercise, and increase veggies.   Chronic pain syndrome Follow up ortho  Tobacco use disorder Smoking cessation-  instruction/counseling given, counseled patient on the dangers of tobacco use, advised patient to stop smoking, and reviewed strategies to maximize success, patient not ready to quit at this time.   Other abnormal glucose Discussed disease progression and risks Discussed diet/exercise, weight management and risk modification  Mixed  hyperlipidemia -continue medications, check lipids, decrease fatty foods, increase activity.  -     Lipid panel  Vitamin D deficiency -     VITAMIN D 25 Hydroxy (Vit-D Deficiency, Fractures)  Medication management -     Magnesium  DDD, lumbar Continue ortho follow up  Memory disorder Continue follow up with Dr. Jannifer Franklin  Over 30 minutes of exam, counseling, chart review, and critical decision making was performed  Future Appointments  Date Time Provider [place]  04/28/2017 11:00 AM Liane Comber, NP [facility] None  06/29/2017 10:30 AM Kathrynn Ducking, MD [facility] None  07/26/2017  2:00 PM Unk Pinto, MD [facility] None     Plan:   During the course of the visit the patient was educated and counseled about appropriate screening and preventive services including:    Pneumococcal vaccine   Influenza vaccine  Prevnar 13  Td vaccine  Screening electrocardiogram  Colorectal cancer screening  Diabetes screening  Glaucoma screening  Nutrition counseling    Subjective:  Amber Hickman is a 62 y.o. female who presents for Medicare Annual Wellness Visit and 3 month follow up for HTN, hyperlipidemia, prediabetes, and vitamin D Def.   Her blood pressure has been controlled at home, today their BP is BP: 122/80 She does not workout. She denies chest pain, shortness of breath, dizziness.   She sees pain management for her back pain.  They are prescribing her pain medications. She is planning on having L4-L5 fusion with Dr. Patrice Paradise. She is here for surgical clearance. He has OSA and is not on CPAP, she is a smoker. She had a normal echo 04/2015, normal stress test 04/2015. Ct lung 07/14/2016.    She is still smoking, not  ready to quit at this time. Has had frequent falls and some memory issues. She is following with Dr. Jannifer Franklin. Had MRI 12/2016 that was normal, she is tapered off topamax and gabapentin.   She is on cholesterol medication and denies myalgias.  Her cholesterol is not at goal. The cholesterol last visit was:   Lab Results  Component Value Date   CHOL 169 01/19/2017   HDL 63 01/19/2017   LDLCALC 65 07/05/2016   TRIG 126 01/19/2017   CHOLHDL 2.7 01/19/2017   :  Lab Results  Component Value Date   HGBA1C 5.5 01/19/2017   Last GFR Lab Results  Component Value Date   GFRNONAA 72 01/19/2017    Patient is on Vitamin D supplement.   Lab Results  Component Value Date   VD25OH 37 01/19/2017     BMI is Body mass index is 26.23 kg/m., she is working on diet and exercise. Wt Readings from Last 3 Encounters:  04/21/17 152 lb 12.8 oz (69.3 kg)  01/19/17 154 lb 3.2 oz (69.9 kg)  12/23/16 151 lb 8 oz (68.7 kg)    Medication Review: Current Outpatient Medications on File Prior to Visit  Medication Sig Dispense Refill  . baclofen (LIORESAL) 10 MG tablet Take 10 mg 3 (three) times daily as needed by mouth for muscle spasms.    . cholecalciferol 5000 units TABS Take 2 tablets daily    . FLUoxetine (PROZAC) 40 MG capsule Take 1 capsule daily 90 capsule 1  . ibuprofen (ADVIL,MOTRIN) 200 MG tablet Take 200 mg every 6 (six) hours as needed by mouth.    . oxyCODONE-acetaminophen (PERCOCET) 10-325 MG per tablet Take 1 tablet by mouth every 6 (six) hours as needed for pain.    . simvastatin (ZOCOR) 40 MG tablet take 1 tablet by mouth at bedtime 90 tablet 1   No current facility-administered medications on file prior to visit.     Allergies: Allergies  Allergen Reactions  . Augmentin [Amoxicillin-Pot Clavulanate]   . Codeine Nausea And Vomiting  . Lipitor [Atorvastatin]     Leg pain  . Wellbutrin [Bupropion]     Pt does not recall reaction   . Zoloft [Sertraline Hcl]     Pt does not recall reaction     Current Problems (verified) has Hyperlipidemia; Other abnormal glucose; Vitamin D deficiency; COPD (chronic obstructive pulmonary disease) ([place]); Medication management; CKD (chronic kidney disease) stage 2, GFR 60-89 ml/min;  Tobacco use disorder; DDD, lumbar; Chronic pain syndrome; HTN (hypertension); Depression, major, recurrent, in partial remission ([place]); OSA and COPD overlap syndrome ([place]); and Memory disorder on their problem list.  Screening Tests Immunization History  Administered Date(s) Administered  . Td 01/19/2004, 06/05/2015    Preventative care: Last colonoscopy: 2007, due Mammogram 06/05/14 Echo 2017 Stress test 2017 Ct lung 06/2016 PAP 2011 declines  Prior vaccinations: TD or Tdap 2017  Influenza: Declined  Pneumococcal: Due, but declined Prevnar13: Declined Shingles/Zostavax: Declined  Names of Other Physician/Practitioners you currently use: 1. [facility] here for primary care 2. Dr. Delman Cheadle, eye doctor, last visit 2018, has also had cataract surgery with Dr. Katy Fitch 3. Dr.Sarfan , dentist, last visit 2018 Patient Care Team: Unk Pinto, MD as PCP - General (Internal Medicine) Richmond Campbell, MD as Consulting Physician (Gastroenterology) Vicie Mutters, PA-C as Referring Physician (Physician Assistant)  Surgical: She  has a past surgical history that includes Cataract extraction (Bilateral); Spine surgery; Ankle surgery (Right); and Tonsillectomy and adenoidectomy. Family Her family history  includes COPD in her mother; Cancer in her mother; Heart disease in her father; Hypertension in her mother; Stroke in her mother. Social history  She reports that she has been smoking.  She has a 30.00 pack-year smoking history. she has never used smokeless tobacco. She reports that she does not drink alcohol or use drugs.  MEDICARE WELLNESS OBJECTIVES: Physical activity: Current Exercise Habits: The patient does not participate in regular exercise at present, Exercise limited by: orthopedic condition(s) Cardiac risk factors: Cardiac Risk Factors include: advanced age (>40men, >66 women);dyslipidemia;hypertension;sedentary lifestyle;smoking/ tobacco  exposure Depression/mood screen:   Depression screen [facility] 2/9 04/21/2017  Decreased Interest 0  Down, Depressed, Hopeless 0  PHQ - 2 Score 0    ADLs:  In your present state of health, do you have any difficulty performing the following activities: 04/21/2017 01/19/2017  Hearing? N N  Vision? N N  Difficulty concentrating or making decisions? Y N  Walking or climbing stairs? Y N  Dressing or bathing? N N  Doing errands, shopping? N N  Some recent data might be hidden     Cognitive Testing  Alert? Yes  Normal Appearance?Yes  Oriented to person? Yes  Place? Yes   Time? Yes  Recall of three objects?  Yes  Can perform simple calculations? Yes  Displays appropriate judgment?Yes  Can read the correct time from a watch face?Yes  EOL planning: Does Patient Have a Medical Advance Directive?: Yes Type of Advance Directive: Healthcare Power of Attorney, Living will Does patient want to make changes to medical advance directive?: No - Patient declined   Objective:   Today's Vitals   04/21/17 1112  BP: 122/80  Pulse: 87  Temp: 97.7 F (36.5 C)  SpO2: 96%  Weight: 152 lb 12.8 oz (69.3 kg)  Height: 5\' 4"  (1.626 m)   Body mass index is 26.23 kg/m.  General appearance: alert, no distress, WD/WN, female HEENT: normocephalic, sclerae anicteric, TMs pearly, nares patent, no discharge or erythema, pharynx normal Oral cavity: MMM, no lesions Neck: supple, no lymphadenopathy, no thyromegaly, no masses Heart: RRR, normal S1, S2, no murmurs Lungs: CTA bilaterally, no wheezes, rhonchi, or rales Abdomen: +bs, soft, non tender, non distended, no masses, no hepatomegaly, no splenomegaly Musculoskeletal: nontender, no swelling, no obvious deformity Extremities: no edema, no cyanosis, no clubbing Pulses: 2+ symmetric, upper and lower extremities, normal cap refill Neurological: alert, oriented x 3, CN2-12 intact, strength normal upper extremities and lower extremities, sensation normal throughout,  DTRs 2+ throughout, no cerebellar signs, gait normal Psychiatric: normal affect, behavior normal, pleasant   Medicare Attestation I have personally reviewed: The patient's medical and social history Their use of alcohol, tobacco or illicit drugs Their current medications and supplements The patient's functional ability including ADLs,fall risks, home safety risks, cognitive, and hearing and visual impairment Diet and physical activities Evidence for depression or mood disorders  The patient's weight, height, BMI, and visual acuity have been recorded in the chart.  I have made referrals, counseling, and provided education to the patient based on review of the above and I have provided the patient with a written personalized care plan for preventive services.     Vicie Mutters, PA-C   04/21/2017

## 2017-04-21 ENCOUNTER — Ambulatory Visit (INDEPENDENT_AMBULATORY_CARE_PROVIDER_SITE_OTHER): Payer: Medicare Other | Admitting: Physician Assistant

## 2017-04-21 ENCOUNTER — Encounter: Payer: Self-pay | Admitting: Physician Assistant

## 2017-04-21 ENCOUNTER — Ambulatory Visit: Payer: Self-pay | Admitting: Adult Health

## 2017-04-21 VITALS — BP 122/80 | HR 87 | Temp 97.7°F | Ht 64.0 in | Wt 152.8 lb

## 2017-04-21 DIAGNOSIS — E782 Mixed hyperlipidemia: Secondary | ICD-10-CM

## 2017-04-21 DIAGNOSIS — R6889 Other general symptoms and signs: Secondary | ICD-10-CM | POA: Diagnosis not present

## 2017-04-21 DIAGNOSIS — J449 Chronic obstructive pulmonary disease, unspecified: Secondary | ICD-10-CM | POA: Diagnosis not present

## 2017-04-21 DIAGNOSIS — I771 Stricture of artery: Secondary | ICD-10-CM

## 2017-04-21 DIAGNOSIS — Z79899 Other long term (current) drug therapy: Secondary | ICD-10-CM | POA: Diagnosis not present

## 2017-04-21 DIAGNOSIS — G894 Chronic pain syndrome: Secondary | ICD-10-CM | POA: Diagnosis not present

## 2017-04-21 DIAGNOSIS — I10 Essential (primary) hypertension: Secondary | ICD-10-CM | POA: Diagnosis not present

## 2017-04-21 DIAGNOSIS — Z01818 Encounter for other preprocedural examination: Secondary | ICD-10-CM | POA: Diagnosis not present

## 2017-04-21 DIAGNOSIS — R7309 Other abnormal glucose: Secondary | ICD-10-CM

## 2017-04-21 DIAGNOSIS — J410 Simple chronic bronchitis: Secondary | ICD-10-CM | POA: Diagnosis not present

## 2017-04-21 DIAGNOSIS — E559 Vitamin D deficiency, unspecified: Secondary | ICD-10-CM

## 2017-04-21 DIAGNOSIS — Z0001 Encounter for general adult medical examination with abnormal findings: Secondary | ICD-10-CM | POA: Diagnosis not present

## 2017-04-21 DIAGNOSIS — F17200 Nicotine dependence, unspecified, uncomplicated: Secondary | ICD-10-CM | POA: Diagnosis not present

## 2017-04-21 DIAGNOSIS — N182 Chronic kidney disease, stage 2 (mild): Secondary | ICD-10-CM | POA: Diagnosis not present

## 2017-04-21 DIAGNOSIS — I700 Atherosclerosis of aorta: Secondary | ICD-10-CM

## 2017-04-21 DIAGNOSIS — M5136 Other intervertebral disc degeneration, lumbar region: Secondary | ICD-10-CM

## 2017-04-21 DIAGNOSIS — F3341 Major depressive disorder, recurrent, in partial remission: Secondary | ICD-10-CM | POA: Diagnosis not present

## 2017-04-21 DIAGNOSIS — Z0000 Encounter for general adult medical examination without abnormal findings: Secondary | ICD-10-CM

## 2017-04-21 DIAGNOSIS — R413 Other amnesia: Secondary | ICD-10-CM

## 2017-04-21 DIAGNOSIS — G4733 Obstructive sleep apnea (adult) (pediatric): Secondary | ICD-10-CM

## 2017-04-21 NOTE — Patient Instructions (Addendum)
[facility]  7 a.m.-6:30 p.m., Monday 7 a.m.-5 p.m., Tuesday-Friday Schedule an appointment by calling [phone].  Encourage you to get the 3D Mammogram  The 3D Mammogram is much more specific and sensitive to pick up breast cancer. For women with fibrocystic breast or lumpy breast it can be hard to determine if it is cancer or not but the 3D mammogram is able to tell this difference which cuts back on unneeded additional tests or scary call backs.   - over 40% increase in detection of breast cancer - over 40% reduction in false positives.  - fewer call backs - reduced anxiety - improved outcomes - [facility]  Please call Dr. Earlean Shawl for colonoscopy Phone: [phone];

## 2017-04-22 LAB — CBC WITH DIFFERENTIAL/PLATELET
Basophils Absolute: 43 cells/uL (ref 0–200)
Basophils Relative: 0.5 %
EOS PCT: 0.6 %
Eosinophils Absolute: 51 cells/uL (ref 15–500)
HEMATOCRIT: 42.2 % (ref 35.0–45.0)
Hemoglobin: 14.6 g/dL (ref 11.7–15.5)
LYMPHS ABS: 1683 {cells}/uL (ref 850–3900)
MCH: 31.5 pg (ref 27.0–33.0)
MCHC: 34.6 g/dL (ref 32.0–36.0)
MCV: 91.1 fL (ref 80.0–100.0)
MPV: 10.6 fL (ref 7.5–12.5)
Monocytes Relative: 4.9 %
NEUTROS PCT: 74.2 %
Neutro Abs: 6307 cells/uL (ref 1500–7800)
PLATELETS: 350 10*3/uL (ref 140–400)
RBC: 4.63 10*6/uL (ref 3.80–5.10)
RDW: 12.1 % (ref 11.0–15.0)
TOTAL LYMPHOCYTE: 19.8 %
WBC mixed population: 417 cells/uL (ref 200–950)
WBC: 8.5 10*3/uL (ref 3.8–10.8)

## 2017-04-22 LAB — HEPATIC FUNCTION PANEL
AG Ratio: 1.8 (calc) (ref 1.0–2.5)
ALKALINE PHOSPHATASE (APISO): 80 U/L (ref 33–130)
ALT: 23 U/L (ref 6–29)
AST: 24 U/L (ref 10–35)
Albumin: 4.5 g/dL (ref 3.6–5.1)
BILIRUBIN DIRECT: 0.1 mg/dL (ref 0.0–0.2)
BILIRUBIN INDIRECT: 0.3 mg/dL (ref 0.2–1.2)
BILIRUBIN TOTAL: 0.4 mg/dL (ref 0.2–1.2)
Globulin: 2.5 g/dL (calc) (ref 1.9–3.7)
Total Protein: 7 g/dL (ref 6.1–8.1)

## 2017-04-22 LAB — LIPID PANEL
CHOLESTEROL: 153 mg/dL (ref <200)
HDL: 61 mg/dL (ref >50)
LDL Cholesterol (Calc): 68 mg/dL (calc)
Non-HDL Cholesterol (Calc): 92 mg/dL (calc) (ref <130)
Total CHOL/HDL Ratio: 2.5 (calc) (ref <5.0)
Triglycerides: 161 mg/dL — ABNORMAL HIGH (ref <150)

## 2017-04-22 LAB — BASIC METABOLIC PANEL WITH GFR
BUN: 10 mg/dL (ref 7–25)
CALCIUM: 9.9 mg/dL (ref 8.6–10.4)
CHLORIDE: 104 mmol/L (ref 98–110)
CO2: 27 mmol/L (ref 20–32)
Creat: 0.71 mg/dL (ref 0.50–0.99)
GFR, EST AFRICAN AMERICAN: 106 mL/min/{1.73_m2} (ref >=60)
GFR, EST NON AFRICAN AMERICAN: 91 mL/min/{1.73_m2} (ref >=60)
Glucose, Bld: 113 mg/dL — ABNORMAL HIGH (ref 65–99)
POTASSIUM: 4.1 mmol/L (ref 3.5–5.3)
SODIUM: 141 mmol/L (ref 135–146)

## 2017-04-22 LAB — MAGNESIUM: MAGNESIUM: 2.1 mg/dL (ref 1.5–2.5)

## 2017-04-22 LAB — VITAMIN D 25 HYDROXY (VIT D DEFICIENCY, FRACTURES): VIT D 25 HYDROXY: 61 ng/mL (ref 30–100)

## 2017-04-22 LAB — TSH: TSH: 0.5 m[IU]/L (ref 0.40–4.50)

## 2017-04-28 ENCOUNTER — Other Ambulatory Visit: Payer: Self-pay | Admitting: Internal Medicine

## 2017-04-28 ENCOUNTER — Ambulatory Visit: Payer: Self-pay | Admitting: Adult Health

## 2017-04-28 DIAGNOSIS — Z1231 Encounter for screening mammogram for malignant neoplasm of breast: Secondary | ICD-10-CM

## 2017-05-13 DIAGNOSIS — M4716 Other spondylosis with myelopathy, lumbar region: Secondary | ICD-10-CM | POA: Diagnosis not present

## 2017-05-13 DIAGNOSIS — M961 Postlaminectomy syndrome, not elsewhere classified: Secondary | ICD-10-CM | POA: Diagnosis not present

## 2017-05-13 DIAGNOSIS — M5417 Radiculopathy, lumbosacral region: Secondary | ICD-10-CM | POA: Diagnosis not present

## 2017-05-13 DIAGNOSIS — Z4689 Encounter for fitting and adjustment of other specified devices: Secondary | ICD-10-CM | POA: Diagnosis not present

## 2017-05-13 DIAGNOSIS — M5106 Intervertebral disc disorders with myelopathy, lumbar region: Secondary | ICD-10-CM | POA: Diagnosis not present

## 2017-05-13 DIAGNOSIS — M545 Low back pain: Secondary | ICD-10-CM | POA: Diagnosis not present

## 2017-05-16 ENCOUNTER — Ambulatory Visit
Admission: RE | Admit: 2017-05-16 | Discharge: 2017-05-16 | Disposition: A | Discharge status: Home / Self Care | Payer: Medicare Other | Source: Ambulatory Visit | Attending: Internal Medicine | Admitting: Internal Medicine

## 2017-05-16 DIAGNOSIS — Z1231 Encounter for screening mammogram for malignant neoplasm of breast: Secondary | ICD-10-CM

## 2017-05-24 DIAGNOSIS — Z01818 Encounter for other preprocedural examination: Secondary | ICD-10-CM | POA: Diagnosis not present

## 2017-05-24 DIAGNOSIS — R9431 Abnormal electrocardiogram [ECG] [EKG]: Secondary | ICD-10-CM | POA: Diagnosis not present

## 2017-05-24 DIAGNOSIS — M545 Low back pain: Secondary | ICD-10-CM | POA: Diagnosis not present

## 2017-05-30 DIAGNOSIS — G4733 Obstructive sleep apnea (adult) (pediatric): Secondary | ICD-10-CM | POA: Diagnosis not present

## 2017-05-30 DIAGNOSIS — M4716 Other spondylosis with myelopathy, lumbar region: Secondary | ICD-10-CM | POA: Diagnosis not present

## 2017-05-30 DIAGNOSIS — M21379 Foot drop, unspecified foot: Secondary | ICD-10-CM | POA: Diagnosis not present

## 2017-05-30 DIAGNOSIS — E785 Hyperlipidemia, unspecified: Secondary | ICD-10-CM | POA: Diagnosis not present

## 2017-05-30 DIAGNOSIS — M961 Postlaminectomy syndrome, not elsewhere classified: Secondary | ICD-10-CM | POA: Diagnosis not present

## 2017-05-30 DIAGNOSIS — M532X6 Spinal instabilities, lumbar region: Secondary | ICD-10-CM | POA: Diagnosis not present

## 2017-05-30 DIAGNOSIS — J449 Chronic obstructive pulmonary disease, unspecified: Secondary | ICD-10-CM | POA: Diagnosis not present

## 2017-05-30 DIAGNOSIS — M48061 Spinal stenosis, lumbar region without neurogenic claudication: Secondary | ICD-10-CM | POA: Diagnosis not present

## 2017-05-30 DIAGNOSIS — M4726 Other spondylosis with radiculopathy, lumbar region: Secondary | ICD-10-CM | POA: Diagnosis not present

## 2017-05-30 DIAGNOSIS — M47896 Other spondylosis, lumbar region: Secondary | ICD-10-CM | POA: Diagnosis not present

## 2017-05-30 DIAGNOSIS — M5136 Other intervertebral disc degeneration, lumbar region: Secondary | ICD-10-CM | POA: Diagnosis not present

## 2017-05-30 DIAGNOSIS — M48062 Spinal stenosis, lumbar region with neurogenic claudication: Secondary | ICD-10-CM | POA: Diagnosis not present

## 2017-05-30 DIAGNOSIS — M5106 Intervertebral disc disorders with myelopathy, lumbar region: Secondary | ICD-10-CM | POA: Diagnosis not present

## 2017-05-30 DIAGNOSIS — I10 Essential (primary) hypertension: Secondary | ICD-10-CM | POA: Diagnosis not present

## 2017-05-30 DIAGNOSIS — Z87891 Personal history of nicotine dependence: Secondary | ICD-10-CM | POA: Diagnosis not present

## 2017-05-30 DIAGNOSIS — M5126 Other intervertebral disc displacement, lumbar region: Secondary | ICD-10-CM | POA: Diagnosis not present

## 2017-06-07 DIAGNOSIS — E785 Hyperlipidemia, unspecified: Secondary | ICD-10-CM | POA: Diagnosis not present

## 2017-06-07 DIAGNOSIS — Z981 Arthrodesis status: Secondary | ICD-10-CM | POA: Diagnosis not present

## 2017-06-07 DIAGNOSIS — J449 Chronic obstructive pulmonary disease, unspecified: Secondary | ICD-10-CM | POA: Diagnosis not present

## 2017-06-07 DIAGNOSIS — R269 Unspecified abnormalities of gait and mobility: Secondary | ICD-10-CM | POA: Diagnosis not present

## 2017-06-07 DIAGNOSIS — Z4789 Encounter for other orthopedic aftercare: Secondary | ICD-10-CM | POA: Diagnosis not present

## 2017-06-07 DIAGNOSIS — M1990 Unspecified osteoarthritis, unspecified site: Secondary | ICD-10-CM | POA: Diagnosis not present

## 2017-06-07 DIAGNOSIS — I10 Essential (primary) hypertension: Secondary | ICD-10-CM | POA: Diagnosis not present

## 2017-06-09 DIAGNOSIS — R269 Unspecified abnormalities of gait and mobility: Secondary | ICD-10-CM | POA: Diagnosis not present

## 2017-06-09 DIAGNOSIS — I10 Essential (primary) hypertension: Secondary | ICD-10-CM | POA: Diagnosis not present

## 2017-06-09 DIAGNOSIS — J449 Chronic obstructive pulmonary disease, unspecified: Secondary | ICD-10-CM | POA: Diagnosis not present

## 2017-06-09 DIAGNOSIS — Z4789 Encounter for other orthopedic aftercare: Secondary | ICD-10-CM | POA: Diagnosis not present

## 2017-06-09 DIAGNOSIS — M1990 Unspecified osteoarthritis, unspecified site: Secondary | ICD-10-CM | POA: Diagnosis not present

## 2017-06-09 DIAGNOSIS — E785 Hyperlipidemia, unspecified: Secondary | ICD-10-CM | POA: Diagnosis not present

## 2017-06-09 DIAGNOSIS — Z981 Arthrodesis status: Secondary | ICD-10-CM | POA: Diagnosis not present

## 2017-06-10 DIAGNOSIS — J449 Chronic obstructive pulmonary disease, unspecified: Secondary | ICD-10-CM | POA: Diagnosis not present

## 2017-06-10 DIAGNOSIS — I10 Essential (primary) hypertension: Secondary | ICD-10-CM | POA: Diagnosis not present

## 2017-06-10 DIAGNOSIS — R269 Unspecified abnormalities of gait and mobility: Secondary | ICD-10-CM | POA: Diagnosis not present

## 2017-06-10 DIAGNOSIS — Z981 Arthrodesis status: Secondary | ICD-10-CM | POA: Diagnosis not present

## 2017-06-10 DIAGNOSIS — Z4789 Encounter for other orthopedic aftercare: Secondary | ICD-10-CM | POA: Diagnosis not present

## 2017-06-10 DIAGNOSIS — E785 Hyperlipidemia, unspecified: Secondary | ICD-10-CM | POA: Diagnosis not present

## 2017-06-10 DIAGNOSIS — M1990 Unspecified osteoarthritis, unspecified site: Secondary | ICD-10-CM | POA: Diagnosis not present

## 2017-06-13 DIAGNOSIS — E785 Hyperlipidemia, unspecified: Secondary | ICD-10-CM | POA: Diagnosis not present

## 2017-06-13 DIAGNOSIS — R269 Unspecified abnormalities of gait and mobility: Secondary | ICD-10-CM | POA: Diagnosis not present

## 2017-06-13 DIAGNOSIS — J449 Chronic obstructive pulmonary disease, unspecified: Secondary | ICD-10-CM | POA: Diagnosis not present

## 2017-06-13 DIAGNOSIS — Z4789 Encounter for other orthopedic aftercare: Secondary | ICD-10-CM | POA: Diagnosis not present

## 2017-06-13 DIAGNOSIS — Z981 Arthrodesis status: Secondary | ICD-10-CM | POA: Diagnosis not present

## 2017-06-13 DIAGNOSIS — M1990 Unspecified osteoarthritis, unspecified site: Secondary | ICD-10-CM | POA: Diagnosis not present

## 2017-06-13 DIAGNOSIS — I10 Essential (primary) hypertension: Secondary | ICD-10-CM | POA: Diagnosis not present

## 2017-06-15 DIAGNOSIS — Z981 Arthrodesis status: Secondary | ICD-10-CM | POA: Diagnosis not present

## 2017-06-15 DIAGNOSIS — J449 Chronic obstructive pulmonary disease, unspecified: Secondary | ICD-10-CM | POA: Diagnosis not present

## 2017-06-15 DIAGNOSIS — R269 Unspecified abnormalities of gait and mobility: Secondary | ICD-10-CM | POA: Diagnosis not present

## 2017-06-15 DIAGNOSIS — Z4789 Encounter for other orthopedic aftercare: Secondary | ICD-10-CM | POA: Diagnosis not present

## 2017-06-15 DIAGNOSIS — M1990 Unspecified osteoarthritis, unspecified site: Secondary | ICD-10-CM | POA: Diagnosis not present

## 2017-06-15 DIAGNOSIS — E785 Hyperlipidemia, unspecified: Secondary | ICD-10-CM | POA: Diagnosis not present

## 2017-06-15 DIAGNOSIS — I10 Essential (primary) hypertension: Secondary | ICD-10-CM | POA: Diagnosis not present

## 2017-06-16 DIAGNOSIS — R269 Unspecified abnormalities of gait and mobility: Secondary | ICD-10-CM | POA: Diagnosis not present

## 2017-06-17 DIAGNOSIS — Z4789 Encounter for other orthopedic aftercare: Secondary | ICD-10-CM | POA: Diagnosis not present

## 2017-06-17 DIAGNOSIS — E785 Hyperlipidemia, unspecified: Secondary | ICD-10-CM | POA: Diagnosis not present

## 2017-06-17 DIAGNOSIS — Z981 Arthrodesis status: Secondary | ICD-10-CM | POA: Diagnosis not present

## 2017-06-17 DIAGNOSIS — J449 Chronic obstructive pulmonary disease, unspecified: Secondary | ICD-10-CM | POA: Diagnosis not present

## 2017-06-17 DIAGNOSIS — I10 Essential (primary) hypertension: Secondary | ICD-10-CM | POA: Diagnosis not present

## 2017-06-17 DIAGNOSIS — R269 Unspecified abnormalities of gait and mobility: Secondary | ICD-10-CM | POA: Diagnosis not present

## 2017-06-17 DIAGNOSIS — M1990 Unspecified osteoarthritis, unspecified site: Secondary | ICD-10-CM | POA: Diagnosis not present

## 2017-06-21 DIAGNOSIS — E785 Hyperlipidemia, unspecified: Secondary | ICD-10-CM | POA: Diagnosis not present

## 2017-06-21 DIAGNOSIS — J449 Chronic obstructive pulmonary disease, unspecified: Secondary | ICD-10-CM | POA: Diagnosis not present

## 2017-06-21 DIAGNOSIS — Z981 Arthrodesis status: Secondary | ICD-10-CM | POA: Diagnosis not present

## 2017-06-21 DIAGNOSIS — M1990 Unspecified osteoarthritis, unspecified site: Secondary | ICD-10-CM | POA: Diagnosis not present

## 2017-06-21 DIAGNOSIS — Z4789 Encounter for other orthopedic aftercare: Secondary | ICD-10-CM | POA: Diagnosis not present

## 2017-06-21 DIAGNOSIS — R269 Unspecified abnormalities of gait and mobility: Secondary | ICD-10-CM | POA: Diagnosis not present

## 2017-06-21 DIAGNOSIS — I10 Essential (primary) hypertension: Secondary | ICD-10-CM | POA: Diagnosis not present

## 2017-06-23 DIAGNOSIS — J449 Chronic obstructive pulmonary disease, unspecified: Secondary | ICD-10-CM | POA: Diagnosis not present

## 2017-06-23 DIAGNOSIS — M1990 Unspecified osteoarthritis, unspecified site: Secondary | ICD-10-CM | POA: Diagnosis not present

## 2017-06-23 DIAGNOSIS — Z981 Arthrodesis status: Secondary | ICD-10-CM | POA: Diagnosis not present

## 2017-06-23 DIAGNOSIS — I10 Essential (primary) hypertension: Secondary | ICD-10-CM | POA: Diagnosis not present

## 2017-06-23 DIAGNOSIS — Z4789 Encounter for other orthopedic aftercare: Secondary | ICD-10-CM | POA: Diagnosis not present

## 2017-06-23 DIAGNOSIS — R269 Unspecified abnormalities of gait and mobility: Secondary | ICD-10-CM | POA: Diagnosis not present

## 2017-06-23 DIAGNOSIS — E785 Hyperlipidemia, unspecified: Secondary | ICD-10-CM | POA: Diagnosis not present

## 2017-06-29 ENCOUNTER — Ambulatory Visit: Payer: Medicare Other | Admitting: Neurology

## 2017-07-01 DIAGNOSIS — M4716 Other spondylosis with myelopathy, lumbar region: Secondary | ICD-10-CM | POA: Diagnosis not present

## 2017-07-01 DIAGNOSIS — Z981 Arthrodesis status: Secondary | ICD-10-CM | POA: Diagnosis not present

## 2017-07-01 DIAGNOSIS — M4326 Fusion of spine, lumbar region: Secondary | ICD-10-CM | POA: Diagnosis not present

## 2017-07-01 DIAGNOSIS — M961 Postlaminectomy syndrome, not elsewhere classified: Secondary | ICD-10-CM | POA: Diagnosis not present

## 2017-07-01 DIAGNOSIS — M960 Pseudarthrosis after fusion or arthrodesis: Secondary | ICD-10-CM | POA: Diagnosis not present

## 2017-07-01 DIAGNOSIS — M545 Low back pain: Secondary | ICD-10-CM | POA: Diagnosis not present

## 2017-07-26 ENCOUNTER — Encounter: Payer: Self-pay | Admitting: Internal Medicine

## 2017-07-26 ENCOUNTER — Ambulatory Visit (INDEPENDENT_AMBULATORY_CARE_PROVIDER_SITE_OTHER): Payer: Medicare Other | Admitting: Internal Medicine

## 2017-07-26 VITALS — BP 128/62 | HR 76 | Temp 97.4°F | Resp 16 | Ht 64.0 in | Wt 150.0 lb

## 2017-07-26 DIAGNOSIS — Z0000 Encounter for general adult medical examination without abnormal findings: Secondary | ICD-10-CM

## 2017-07-26 DIAGNOSIS — I10 Essential (primary) hypertension: Secondary | ICD-10-CM

## 2017-07-26 DIAGNOSIS — Z0001 Encounter for general adult medical examination with abnormal findings: Secondary | ICD-10-CM

## 2017-07-26 DIAGNOSIS — Z87891 Personal history of nicotine dependence: Secondary | ICD-10-CM

## 2017-07-26 DIAGNOSIS — R7303 Prediabetes: Secondary | ICD-10-CM

## 2017-07-26 DIAGNOSIS — Z79899 Other long term (current) drug therapy: Secondary | ICD-10-CM

## 2017-07-26 DIAGNOSIS — R7309 Other abnormal glucose: Secondary | ICD-10-CM

## 2017-07-26 DIAGNOSIS — E782 Mixed hyperlipidemia: Secondary | ICD-10-CM | POA: Diagnosis not present

## 2017-07-26 DIAGNOSIS — E559 Vitamin D deficiency, unspecified: Secondary | ICD-10-CM

## 2017-07-26 DIAGNOSIS — F17200 Nicotine dependence, unspecified, uncomplicated: Secondary | ICD-10-CM

## 2017-07-26 DIAGNOSIS — J449 Chronic obstructive pulmonary disease, unspecified: Secondary | ICD-10-CM

## 2017-07-26 DIAGNOSIS — G894 Chronic pain syndrome: Secondary | ICD-10-CM

## 2017-07-26 DIAGNOSIS — G4733 Obstructive sleep apnea (adult) (pediatric): Secondary | ICD-10-CM

## 2017-07-26 DIAGNOSIS — M5136 Other intervertebral disc degeneration, lumbar region: Secondary | ICD-10-CM

## 2017-07-26 DIAGNOSIS — Z8249 Family history of ischemic heart disease and other diseases of the circulatory system: Secondary | ICD-10-CM

## 2017-07-26 DIAGNOSIS — Z1212 Encounter for screening for malignant neoplasm of rectum: Secondary | ICD-10-CM

## 2017-07-26 DIAGNOSIS — F17219 Nicotine dependence, cigarettes, with unspecified nicotine-induced disorders: Secondary | ICD-10-CM

## 2017-07-26 DIAGNOSIS — M51369 Other intervertebral disc degeneration, lumbar region without mention of lumbar back pain or lower extremity pain: Secondary | ICD-10-CM

## 2017-07-26 DIAGNOSIS — Z122 Encounter for screening for malignant neoplasm of respiratory organs: Secondary | ICD-10-CM

## 2017-07-26 DIAGNOSIS — Z136 Encounter for screening for cardiovascular disorders: Secondary | ICD-10-CM

## 2017-07-26 DIAGNOSIS — Z1211 Encounter for screening for malignant neoplasm of colon: Secondary | ICD-10-CM

## 2017-07-26 NOTE — Progress Notes (Signed)
[facility] Unk Pinto, M.D.     Uvaldo Bristle. Silverio Lay, P.A.-C Liane Comber, [place] [address] [place] [address] Telephone [phone] Telefax [phone] Annual Screening/Preventative Visit & Comprehensive Evaluation &  Examination     This very nice 62 y.o. single WF presents for a Screening/Preventative Visit & comprehensive evaluation and management of multiple medical co-morbidities.  Patient has been followed for HTN, HLD, T2_NIDDM  Prediabetes  and Vitamin D Deficiency.       The patient has has Chronic LBP from Lumbar DDD s/p surg x 2 in 2004 and is on SS Disability for chronic pain and is followed by Dr Ace Gins for pain management.      Because of  her long smoking history of  over 35 pk years, we discussed lung cancer screening. She  is agreeable to undergo a screening low dose CT scan of the chest.  We discussed smoking cessation techniques/options. I will refer her for a LDCT lung scan & lung cancer screening program. She reports smoking cessation 05/10/2017.       HTN predates circa 1998. Patient's BP has been controlled at home and patient denies any cardiac symptoms as chest pain, palpitations, shortness of breath, dizziness or ankle swelling. Today's BP is at goal - 128/62.      Patient's hyperlipidemia is controlled with diet and medications. Patient denies myalgias or other medication SE's. Last lipids were  Lab Results  Component Value Date   CHOL 153 04/21/2017   HDL 61 04/21/2017   LDLCALC 68 04/21/2017   TRIG 161 (H) 04/21/2017   CHOLHDL 2.5 04/21/2017      Patient has prediabetes (A1c 5.8%/2010) and patient denies reactive hypoglycemic symptoms, visual blurring, diabetic polys, or paresthesias. Last A1c was Normal & at goal: Lab Results  Component Value Date   HGBA1C 5.5 01/19/2017      Finally, patient has history of Vitamin D Deficiency ("20"/2008) and last Vitamin  D was at goal: Lab Results  Component Value Date   VD25OH 61 04/21/2017   Current Outpatient Medications on File Prior to Visit  Medication Sig  . baclofen (LIORESAL) 10 MG tablet Take 10 mg 3 (three) times daily as needed by mouth for muscle spasms.  . cholecalciferol 5000 units TABS Take 2 tablets daily  . FLUoxetine (PROZAC) 40 MG capsule Take 1 capsule daily  . oxyCODONE (ROXICODONE) 15 MG immediate release tablet 15 mg every 6 (six) hours as needed.  . simvastatin (ZOCOR) 40 MG tablet take 1 tablet by mouth at bedtime   No current facility-administered medications on file prior to visit.    Allergies  Allergen Reactions  . Augmentin [Amoxicillin-Pot Clavulanate]   . Codeine Nausea And Vomiting  . Lipitor [Atorvastatin]     Leg pain  . Wellbutrin [Bupropion]     Pt does not recall reaction   . Zoloft [Sertraline Hcl]     Pt does not recall reaction    Past Medical History:  Diagnosis Date  . Allergy   . Arthritis   . AVN (avascular necrosis of bone) (HCC)    Left shoulder  . COPD (chronic obstructive pulmonary disease) ([place])   . Depression   . Diabetes mellitus without complication (HCC)    Pre-diabetes.   Marland Kitchen GERD (gastroesophageal reflux disease)   . Hyperlipidemia   . Hypertension   . Memory disorder 12/23/2016  . Vitamin D deficiency    Health Maintenance  Topic Date Due  .  Hepatitis C Screening  03-25-1955  . PAP SMEAR  01/18/2013  . COLONOSCOPY  01/19/2016  . MAMMOGRAM  05/17/2019  . TETANUS/TDAP  06/04/2025  . HIV Screening  Completed  . INFLUENZA VACCINE  Discontinued   Immunization History  Administered Date(s) Administered  . Td 01/19/2004, 06/05/2015   Last Colon - 03/31/2005 - Dr Earlean Shawl and due 10 yr f/u 2017  Last MGM - 05/16/2017  Past Surgical History:  Procedure Laterality Date  . ANKLE SURGERY Right   . BREAST EXCISIONAL BIOPSY Right   . CATARACT EXTRACTION Bilateral   . SPINE SURGERY     lumbar  . TONSILLECTOMY AND ADENOIDECTOMY      Family History  Problem Relation Age of Onset  . Hypertension Mother   . COPD Mother   . Stroke Mother   . Cancer Mother        breast  . Heart disease Father        Age 16, MI  . Breast cancer Neg Hx    Social History   Tobacco Use  . Smoking status: Former Smoker    Packs/day: 1.00    Years: 30.00    Pack years: 30.00    Last attempt to quit: 05/10/2017    Years since quitting: 0.2  . Smokeless tobacco: Never Used  Substance Use Topics  . Alcohol use: No  . Drug use: No    ROS Constitutional: Denies fever, chills, weight loss/gain, headaches, insomnia,  night sweats, and change in appetite. Does c/o fatigue. Eyes: Denies redness, blurred vision, diplopia, discharge, itchy, watery eyes.  ENT: Denies discharge, congestion, post nasal drip, epistaxis, sore throat, earache, hearing loss, dental pain, Tinnitus, Vertigo, Sinus pain, snoring.  Cardio: Denies chest pain, palpitations, irregular heartbeat, syncope, dyspnea, diaphoresis, orthopnea, PND, claudication, edema Respiratory: denies cough, dyspnea, DOE, pleurisy, hoarseness, laryngitis, wheezing.  Gastrointestinal: Denies dysphagia, heartburn, reflux, water brash, pain, cramps, nausea, vomiting, bloating, diarrhea, constipation, hematemesis, melena, hematochezia, jaundice, hemorrhoids Genitourinary: Denies dysuria, frequency, urgency, nocturia, hesitancy, discharge, hematuria, flank pain Breast: Breast lumps, nipple discharge, bleeding.  Musculoskeletal: Denies  myalgia, stiffness, Jt. Swelling,  limp, and strain/sprain. Denies falls. C/o bilat elbow pains. Has chronic LBP. Skin: Denies puritis, rash, hives, warts, acne, eczema, changing in skin lesion Neuro: No weakness, tremor, incoordination, spasms, paresthesia, pain Psychiatric: Denies confusion, memory loss, sensory loss. Denies Depression. Endocrine: Denies change in weight, skin, hair change, nocturia, and paresthesia, diabetic polys, visual blurring, hyper / hypo  glycemic episodes.  Heme/Lymph: No excessive bleeding, bruising, enlarged lymph nodes.  Physical Exam  BP 128/62   Pulse 76   Temp (!) 97.4 F (36.3 C)   Resp 16   Ht 5\' 4"  (1.626 m)   Wt 150 lb (68 kg)   BMI 25.75 kg/m   General Appearance: Well nourished, well groomed and in no apparent distress.  Eyes: PERRLA, EOMs, conjunctiva no swelling or erythema, normal fundi and vessels. Sinuses: No frontal/maxillary tenderness ENT/Mouth: EACs patent / TMs  nl. Nares clear without erythema, swelling, mucoid exudates. Oral hygiene is good. No erythema, swelling, or exudate. Tongue normal, non-obstructing. Tonsils not swollen or erythematous. Hearing normal.  Neck: Supple, thyroid not palpable. No bruits, nodes or JVD. Respiratory: Respiratory effort normal.  BS equal and clear bilateral without rales, rhonci, wheezing or stridor. Cardio: Heart sounds are normal with regular rate and rhythm and no murmurs, rubs or gallops. Peripheral pulses are normal and equal bilaterally without edema. No aortic or femoral bruits. Chest: symmetric with normal excursions and  percussion. Breasts: Symmetric, without lumps, nipple discharge, retractions, or fibrocystic changes.  Abdomen: Flat, soft with bowel sounds active. Nontender, no guarding, rebound, hernias, masses, or organomegaly.  Lymphatics: Non tender without lymphadenopathy.  Musculoskeletal: Full ROM all peripheral extremities, joint stability, 5/5 strength, and normal gait. (+) tender at both medial epicondyles of elbows.  Skin: Warm and dry without rashes, lesions, cyanosis, clubbing or  ecchymosis.  Neuro: Cranial nerves intact, reflexes equal bilaterally. Normal muscle tone, no cerebellar symptoms. Sensation intact.  Pysch: Alert and oriented X 3, normal affect, Insight and Judgment appropriate.   Assessment and Plan  1. Annual Preventative Screening Examination  2. Essential hypertension  - EKG 12-Lead - Urinalysis, Routine w reflex  microscopic - Microalbumin / creatinine urine ratio - CBC with Differential/Platelet - COMPLETE METABOLIC PANEL WITH GFR - Magnesium - TSH  3. Hyperlipidemia, mixed  - EKG 12-Lead - Lipid panel - TSH  4. Abnormal glucose  - EKG 12-Lead - Hemoglobin A1c - Insulin, random  5. Vitamin D deficiency  - VITAMIN D 25 Hydroxyl  6. Prediabetes  - EKG 12-Lead - Hemoglobin A1c - Insulin, random  7. OSA and COPD overlap syndrome ([place])   8. DDD, lumbar   9. Chronic pain syndrome   10. Screening for colorectal cancer  - POC Hemoccult Bld/Stl   11. Cigarette nicotine dependence with nicotine-induced disorder  - CT CHEST LUNG CA SCREEN LOW DOSE W/O CM; Future  12. Screening for ischemic heart disease  - EKG 12-Lead  13. FHx: heart disease  - EKG 12-Lead  14. Former smoker  - EKG 12-Lead - CT CHEST LUNG CA SCREEN LOW DOSE W/O CM; Future  15. Encounter for screening for lung cancer  - CT CHEST LUNG CA SCREEN LOW DOSE W/O CM; Future  16. Tobacco use disorder  - CT CHEST LUNG CA SCREEN LOW DOSE W/O CM;   17. Medication management  - Urinalysis, Routine w reflex microscopic - Microalbumin / creatinine urine ratio - CBC with Differential/Platelet - COMPLETE METABOLIC PANEL WITH GFR - Magnesium - Lipid panel - TSH - Hemoglobin A1c - Insulin, random - VITAMIN D 25 Hydroxyl  18. Encounter for screening for malignant neoplasm of respiratory organs  - CT CHEST LUNG CA SCREEN LOW DOSE W/O CM; Future           Patient was counseled in prudent diet to achieve/maintain BMI less than 25 for weight control, BP monitoring, regular exercise and medications. Discussed med's effects and SE's. Screening labs and tests as requested with regular follow-up as recommended. Over 40 minutes of exam, counseling, chart review and high complex critical decision making was performed.

## 2017-07-26 NOTE — Patient Instructions (Signed)
Preventive Care for Adults  A healthy lifestyle and preventive care can promote health and wellness. Preventive health guidelines for women include the following key practices.  A routine yearly physical is a good way to check with your health care provider about your health and preventive screening. It is a chance to share any concerns and updates on your health and to receive a thorough exam.  Visit your dentist for a routine exam and preventive care every 6 months. Brush your teeth twice a day and floss once a day. Good oral hygiene prevents tooth decay and gum disease.  The frequency of eye exams is based on your age, health, family medical history, use of contact lenses, and other factors. Follow your health care provider's recommendations for frequency of eye exams.  Eat a healthy diet. Foods like vegetables, fruits, whole grains, low-fat dairy products, and lean protein foods contain the nutrients you need without too many calories. Decrease your intake of foods high in solid fats, added sugars, and salt. Eat the right amount of calories for you. Get information about a proper diet from your health care provider, if necessary.  Regular physical exercise is one of the most important things you can do for your health. Most adults should get at least 150 minutes of moderate-intensity exercise (any activity that increases your heart rate and causes you to sweat) each week. In addition, most adults need muscle-strengthening exercises on 2 or more days a week.  Maintain a healthy weight. The body mass index (BMI) is a screening tool to identify possible weight problems. It provides an estimate of body fat based on height and weight. Your health care provider can find your BMI and can help you achieve or maintain a healthy weight. For adults 20 years and older:  A BMI below 18.5 is considered underweight.  A BMI of 18.5 to 24.9 is normal.  A BMI of 25 to 29.9 is considered overweight.  A BMI of  30 and above is considered obese.  Maintain normal blood lipids and cholesterol levels by exercising and minimizing your intake of saturated fat. Eat a balanced diet with plenty of fruit and vegetables. Blood tests for lipids and cholesterol should begin at age 53 and be repeated every 5 years. If your lipid or cholesterol levels are high, you are over 50, or you are at high risk for heart disease, you may need your cholesterol levels checked more frequently. Ongoing high lipid and cholesterol levels should be treated with medicines if diet and exercise are not working.  If you smoke, find out from your health care provider how to quit. If you do not use tobacco, do not start.  Lung cancer screening is recommended for adults aged 48-80 years who are at high risk for developing lung cancer because of a history of smoking. A yearly low-dose CT scan of the lungs is recommended for people who have at least a 30-pack-year history of smoking and are a current smoker or have quit within the past 15 years. A pack year of smoking is smoking an average of 1 pack of cigarettes a day for 1 year (for example: 1 pack a day for 30 years or 2 packs a day for 15 years). Yearly screening should continue until the smoker has stopped smoking for at least 15 years. Yearly screening should be stopped for people who develop a health problem that would prevent them from having lung cancer treatment.  High blood pressure causes heart disease and increases  the risk of stroke. Your blood pressure should be checked at least every 1 to 2 years. Ongoing high blood pressure should be treated with medicines if weight loss and exercise do not work.  If you are 63-27 years old, ask your health care provider if you should take aspirin to prevent strokes.  Diabetes screening involves taking a blood sample to check your fasting blood sugar level. This should be done once every 3 years, after age 54, if you are within normal weight and  without risk factors for diabetes. Testing should be considered at a younger age or be carried out more frequently if you are overweight and have at least 1 risk factor for diabetes.  Breast cancer screening is essential preventive care for women. You should practice "breast self-awareness." This means understanding the normal appearance and feel of your breasts and may include breast self-examination. Any changes detected, no matter how small, should be reported to a health care provider. Women in their 26s and 30s should have a clinical breast exam (CBE) by a health care provider as part of a regular health exam every 1 to 3 years. After age 84, women should have a CBE every year. Starting at age 56, women should consider having a mammogram (breast X-ray test) every year. Women who have a family history of breast cancer should talk to their health care provider about genetic screening. Women at a high risk of breast cancer should talk to their health care providers about having an MRI and a mammogram every year.  Breast cancer gene (BRCA)-related cancer risk assessment is recommended for women who have family members with BRCA-related cancers. BRCA-related cancers include breast, ovarian, tubal, and peritoneal cancers. Having family members with these cancers may be associated with an increased risk for harmful changes (mutations) in the breast cancer genes BRCA1 and BRCA2. Results of the assessment will determine the need for genetic counseling and BRCA1 and BRCA2 testing.  Routine pelvic exams to screen for cancer are no longer recommended for nonpregnant women who are considered low risk for cancer of the pelvic organs (ovaries, uterus, and vagina) and who do not have symptoms. Ask your health care provider if a screening pelvic exam is right for you.  If you have had past treatment for cervical cancer or a condition that could lead to cancer, you need Pap tests and screening for cancer for at least 20  years after your treatment. If Pap tests have been discontinued, your risk factors (such as having a new sexual partner) need to be reassessed to determine if screening should be resumed. Some women have medical problems that increase the chance of getting cervical cancer. In these cases, your health care provider may recommend more frequent screening and Pap tests.  Colorectal cancer can be detected and often prevented. Most routine colorectal cancer screening begins at the age of 63 years and continues through age 44 years. However, your health care provider may recommend screening at an earlier age if you have risk factors for colon cancer. On a yearly basis, your health care provider may provide home test kits to check for hidden blood in the stool. Use of a small camera at the end of a tube, to directly examine the colon (sigmoidoscopy or colonoscopy), can detect the earliest forms of colorectal cancer. Talk to your health care provider about this at age 68, when routine screening begins.  Direct exam of the colon should be repeated every 5-10 years through age 44 years, unless  early forms of pre-cancerous polyps or small growths are found.  Hepatitis C blood testing is recommended for all people born from [birth year] through 1965 and any individual with known risks for hepatitis C.  Pra  Osteoporosis is a disease in which the bones lose minerals and strength with aging. This can result in serious bone fractures or breaks. The risk of osteoporosis can be identified using a bone density scan. Women ages 28 years and over and women at risk for fractures or osteoporosis should discuss screening with their health care providers. Ask your health care provider whether you should take a calcium supplement or vitamin D to reduce the rate of osteoporosis.  Menopause can be associated with physical symptoms and risks. Hormone replacement therapy is available to decrease symptoms and risks. You should talk to your  health care provider about whether hormone replacement therapy is right for you.  Use sunscreen. Apply sunscreen liberally and repeatedly throughout the day. You should seek shade when your shadow is shorter than you. Protect yourself by wearing long sleeves, pants, a wide-brimmed hat, and sunglasses year round, whenever you are outdoors.  Once a month, do a whole body skin exam, using a mirror to look at the skin on your back. Tell your health care provider of new moles, moles that have irregular borders, moles that are larger than a pencil eraser, or moles that have changed in shape or color.  Stay current with required vaccines (immunizations).  Influenza vaccine. All adults should be immunized every year.  Tetanus, diphtheria, and acellular pertussis (Td, Tdap) vaccine. Pregnant women should receive 1 dose of Tdap vaccine during each pregnancy. The dose should be obtained regardless of the length of time since the last dose. Immunization is preferred during the 27th-36th week of gestation. An adult who has not previously received Tdap or who does not know her vaccine status should receive 1 dose of Tdap. This initial dose should be followed by tetanus and diphtheria toxoids (Td) booster doses every 10 years. Adults with an unknown or incomplete history of completing a 3-dose immunization series with Td-containing vaccines should begin or complete a primary immunization series including a Tdap dose. Adults should receive a Td booster every 10 years.  Varicella vaccine. An adult without evidence of immunity to varicella should receive 2 doses or a second dose if she has previously received 1 dose. Pregnant females who do not have evidence of immunity should receive the first dose after pregnancy. This first dose should be obtained before leaving the health care facility. The second dose should be obtained 4-8 weeks after the first dose.  Human papillomavirus (HPV) vaccine. Females aged 13-26 years  who have not received the vaccine previously should obtain the 3-dose series. The vaccine is not recommended for use in pregnant females. However, pregnancy testing is not needed before receiving a dose. If a female is found to be pregnant after receiving a dose, no treatment is needed. In that case, the remaining doses should be delayed until after the pregnancy. Immunization is recommended for any person with an immunocompromised condition through the age of 29 years if she did not get any or all doses earlier. During the 3-dose series, the second dose should be obtained 4-8 weeks after the first dose. The third dose should be obtained 24 weeks after the first dose and 16 weeks after the second dose.  Zoster vaccine. One dose is recommended for adults aged 56 years or older unless certain conditions are present.  Measles, mumps, and rubella (MMR) vaccine. Adults born before [birth year] generally are considered immune to measles and mumps. Adults born in [birth year] or later should have 1 or more doses of MMR vaccine unless there is a contraindication to the vaccine or there is laboratory evidence of immunity to each of the three diseases. A routine second dose of MMR vaccine should be obtained at least 28 days after the first dose for students attending postsecondary schools, health care workers, or international travelers. People who received inactivated measles vaccine or an unknown type of measles vaccine during 1963-1967 should receive 2 doses of MMR vaccine. People who received inactivated mumps vaccine or an unknown type of mumps vaccine before 1979 and are at high risk for mumps infection should consider immunization with 2 doses of MMR vaccine. For females of childbearing age, rubella immunity should be determined. If there is no evidence of immunity, females who are not pregnant should be vaccinated. If there is no evidence of immunity, females who are pregnant should delay immunization until after pregnancy.  Unvaccinated health care workers born before [birth year] who lack laboratory evidence of measles, mumps, or rubella immunity or laboratory confirmation of disease should consider measles and mumps immunization with 2 doses of MMR vaccine or rubella immunization with 1 dose of MMR vaccine.  Pneumococcal 13-valent conjugate (PCV13) vaccine. When indicated, a person who is uncertain of her immunization history and has no record of immunization should receive the PCV13 vaccine. An adult aged 42 years or older who has certain medical conditions and has not been previously immunized should receive 1 dose of PCV13 vaccine. This PCV13 should be followed with a dose of pneumococcal polysaccharide (PPSV23) vaccine. The PPSV23 vaccine dose should be obtained at least 1 or more year(s) after the dose of PCV13 vaccine. An adult aged 46 years or older who has certain medical conditions and previously received 1 or more doses of PPSV23 vaccine should receive 1 dose of PCV13. The PCV13 vaccine dose should be obtained 1 or more years after the last PPSV23 vaccine dose.    Pneumococcal polysaccharide (PPSV23) vaccine. When PCV13 is also indicated, PCV13 should be obtained first. All adults aged 58 years and older should be immunized. An adult younger than age 54 years who has certain medical conditions should be immunized. Any person who resides in a nursing home or long-term care facility should be immunized. An adult smoker should be immunized. People with an immunocompromised condition and certain other conditions should receive both PCV13 and PPSV23 vaccines. People with human immunodeficiency virus (HIV) infection should be immunized as soon as possible after diagnosis. Immunization during chemotherapy or radiation therapy should be avoided. Routine use of PPSV23 vaccine is not recommended for American Indians, [place] Natives, or people younger than 65 years unless there are medical conditions that require PPSV23 vaccine. When  indicated, people who have unknown immunization and have no record of immunization should receive PPSV23 vaccine. One-time revaccination 5 years after the first dose of PPSV23 is recommended for people aged 19-64 years who have chronic kidney failure, nephrotic syndrome, asplenia, or immunocompromised conditions. People who received 1-2 doses of PPSV23 before age 97 years should receive another dose of PPSV23 vaccine at age 79 years or later if at least 5 years have passed since the previous dose. Doses of PPSV23 are not needed for people immunized with PPSV23 at or after age 39 years.  Preventive Services / Frequency   Ages 68 to 35 years  Blood pressure check.  Lipid and cholesterol check.  Lung cancer screening. / Every year if you are aged 33-80 years and have a 30-pack-year history of smoking and currently smoke or have quit within the past 15 years. Yearly screening is stopped once you have quit smoking for at least 15 years or develop a health problem that would prevent you from having lung cancer treatment.  Clinical breast exam.** / Every year after age 75 years.   BRCA-related cancer risk assessment.** / For women who have family members with a BRCA-related cancer (breast, ovarian, tubal, or peritoneal cancers).  Mammogram.** / Every year beginning at age 30 years and continuing for as long as you are in good health. Consult with your health care provider.  Pap test.** / Every 3 years starting at age 57 years through age 57 or 21 years with a history of 3 consecutive normal Pap tests.  HPV screening.** / Every 3 years from ages 49 years through ages 56 to 15 years with a history of 3 consecutive normal Pap tests.  Fecal occult blood test (FOBT) of stool. / Every year beginning at age 33 years and continuing until age 41 years. You may not need to do this test if you get a colonoscopy every 10 years.  Flexible sigmoidoscopy or colonoscopy.** / Every 5 years for a flexible  sigmoidoscopy or every 10 years for a colonoscopy beginning at age 33 years and continuing until age 49 years.  Hepatitis C blood test.** / For all people born from [birth year] through 1966 and any individual with known risks for hepatitis C.  Skin self-exam. / Monthly.  Influenza vaccine. / Every year.  Tetanus, diphtheria, and acellular pertussis (Tdap/Td) vaccine.** / Consult your health care provider. Pregnant women should receive 1 dose of Tdap vaccine during each pregnancy. 1 dose of Td every 10 years.  Varicella vaccine.** / Consult your health care provider. Pregnant females who do not have evidence of immunity should receive the first dose after pregnancy.  Zoster vaccine.** / 1 dose for adults aged 75 years or older.  Pneumococcal 13-valent conjugate (PCV13) vaccine.** / Consult your health care provider.  Pneumococcal polysaccharide (PPSV23) vaccine.** / 1 to 2 doses if you smoke cigarettes or if you have certain conditions.  Meningococcal vaccine.** / Consult your health care provider.  Hepatitis A vaccine.** / Consult your health care provider.  Hepatitis B vaccine.** / Consult your health care provider. Screening for abdominal aortic aneurysm (AAA)  by ultrasound is recommended for people over 50 who have history of high blood pressure or who are current or former smokers. ++++++++++++++++++ Recommend Adult Low Dose Aspirin or  coated  Aspirin 81 mg daily  To reduce risk of Colon Cancer 20 %,  Skin Cancer 26 % ,  Melanoma 46%  and  Pancreatic cancer 60% +++++++++++++++++++ Vitamin D goal  is between 70-100.  Please make sure that you are taking your Vitamin D as directed.  It is very important as a natural anti-inflammatory  helping hair, skin, and nails, as well as reducing stroke and heart attack risk.  It helps your bones and helps with mood. It also decreases numerous cancer risks so please take it as directed.  Low Vit D is associated with a 200-300% higher risk  for CANCER  and 200-300% higher risk for HEART   ATTACK  &  STROKE.   .....................................Marland Kitchen It is also associated with higher death rate at younger ages,  autoimmune diseases like Rheumatoid arthritis, Lupus, Multiple Sclerosis.  Also many other serious conditions, like depression, Alzheimer's Dementia, infertility, muscle aches, fatigue, fibromyalgia - just to name a few. ++++++++++++++++++ Recommend the book "The END of DIETING" by Dr Joel Fuhrman  & the book "The END of DIABETES " by Dr Joel Fuhrman At [employer] - get book & Audio CD's    Being diabetic has a  300% increased risk for heart attack, stroke, cancer, and alzheimer- type vascular dementia. It is very important that you work harder with diet by avoiding all foods that are white. Avoid white rice (brown & wild rice is OK), white potatoes (sweetpotatoes in moderation is OK), White bread or wheat bread or anything made out of white flour like bagels, donuts, rolls, buns, biscuits, cakes, pastries, cookies, pizza crust, and pasta (made from white flour & egg whites) - vegetarian pasta or spinach or wheat pasta is OK. Multigrain breads like Arnold's or [employer], or multigrain sandwich thins or flatbreads.  Diet, exercise and weight loss can reverse and cure diabetes in the early stages.  Diet, exercise and weight loss is very important in the control and prevention of complications of diabetes which affects every system in your body, ie. Brain - dementia/stroke, eyes - glaucoma/blindness, heart - heart attack/heart failure, kidneys - dialysis, stomach - gastric paralysis, intestines - malabsorption, nerves - severe painful neuritis, circulation - gangrene & loss of a leg(s), and finally cancer and Alzheimers.    I recommend avoid fried & greasy foods,  sweets/candy, white rice (brown or wild rice or Quinoa is OK), white potatoes (sweet potatoes are OK) - anything made from white flour - bagels, doughnuts, rolls, buns,  biscuits,white and wheat breads, pizza crust and traditional pasta made of white flour & egg white(vegetarian pasta or spinach or wheat pasta is OK).  Multi-grain bread is OK - like multi-grain flat bread or sandwich thins. Avoid alcohol in excess. Exercise is also important.    Eat all the vegetables you want - avoid meat, especially red meat and dairy - especially cheese.  Cheese is the most concentrated form of trans-fats which is the worst thing to clog up our arteries. Veggie cheese is OK which can be found in the fresh produce section at [employer] or [employer] or [employer]  ++++++++++++++++++++++ DASH Eating Plan  DASH stands for "Dietary Approaches to Stop Hypertension."   The DASH eating plan is a healthy eating plan that has been shown to reduce high blood pressure (hypertension). Additional health benefits may include reducing the risk of type 2 diabetes mellitus, heart disease, and stroke. The DASH eating plan may also help with weight loss. WHAT DO I NEED TO KNOW ABOUT THE DASH EATING PLAN? For the DASH eating plan, you will follow these general guidelines:  Choose foods with a percent daily value for sodium of less than 5% (as listed on the food label).  Use salt-free seasonings or herbs instead of table salt or sea salt.  Check with your health care provider or pharmacist before using salt substitutes.  Eat lower-sodium products, often labeled as "lower sodium" or "no salt added."  Eat fresh foods.  Eat more vegetables, fruits, and low-fat dairy products.  Choose whole grains. Look for the word "whole" as the first word in the ingredient list.  Choose fish   Limit sweets, desserts, sugars, and sugary drinks.  Choose heart-healthy fats.  Eat veggie cheese   Eat more home-cooked food and less restaurant, buffet, and fast food.  Limit fried foods.  Cook foods using   methods other than frying.  Limit canned vegetables. If you do use them, rinse them well to  decrease the sodium.  When eating at a restaurant, ask that your food be prepared with less salt, or no salt if possible.                      WHAT FOODS CAN I EAT? Read Dr Fara Olden Fuhrman's books on The End of Dieting & The End of Diabetes  Grains Whole grain or whole wheat bread. Brown rice. Whole grain or whole wheat pasta. Quinoa, bulgur, and whole grain cereals. Low-sodium cereals. Corn or whole wheat flour tortillas. Whole grain cornbread. Whole grain crackers. Low-sodium crackers.  Vegetables Fresh or frozen vegetables (raw, steamed, roasted, or grilled). Low-sodium or reduced-sodium tomato and vegetable juices. Low-sodium or reduced-sodium tomato sauce and paste. Low-sodium or reduced-sodium canned vegetables.   Fruits All fresh, canned (in natural juice), or frozen fruits.  Protein Products  All fish and seafood.  Dried beans, peas, or lentils. Unsalted nuts and seeds. Unsalted canned beans.  Dairy Low-fat dairy products, such as skim or 1% milk, 2% or reduced-fat cheeses, low-fat ricotta or cottage cheese, or plain low-fat yogurt. Low-sodium or reduced-sodium cheeses.  Fats and Oils Tub margarines without trans fats. Light or reduced-fat mayonnaise and salad dressings (reduced sodium). Avocado. Safflower, olive, or canola oils. Natural peanut or almond butter.  Other Unsalted popcorn and pretzels. The items listed above may not be a complete list of recommended foods or beverages. Contact your dietitian for more options.  ++++++++++++++++++  WHAT FOODS ARE NOT RECOMMENDED? Grains/ White flour or wheat flour White bread. White pasta. White rice. Refined cornbread. Bagels and croissants. Crackers that contain trans fat.  Vegetables  Creamed or fried vegetables. Vegetables in a . Regular canned vegetables. Regular canned tomato sauce and paste. Regular tomato and vegetable juices.  Fruits Dried fruits. Canned fruit in light or heavy syrup. Fruit juice.  Meat and Other  Protein Products Meat in general - RED meat & White meat.  Fatty cuts of meat. Ribs, chicken wings, all processed meats as bacon, sausage, bologna, salami, fatback, hot dogs, bratwurst and packaged luncheon meats.  Dairy Whole or 2% milk, cream, half-and-half, and cream cheese. Whole-fat or sweetened yogurt. Full-fat cheeses or blue cheese. Non-dairy creamers and whipped toppings. Processed cheese, cheese spreads, or cheese curds.  Condiments Onion and garlic salt, seasoned salt, table salt, and sea salt. Canned and packaged gravies. Worcestershire sauce. Tartar sauce. Barbecue sauce. Teriyaki sauce. Soy sauce, including reduced sodium. Steak sauce. Fish sauce. Oyster sauce. Cocktail sauce. Horseradish. Ketchup and mustard. Meat flavorings and tenderizers. Bouillon cubes. Hot sauce. Tabasco sauce. Marinades. Taco seasonings. Relishes.  Fats and Oils Butter, stick margarine, lard, shortening and bacon fat. Coconut, palm kernel, or palm oils. Regular salad dressings.  Pickles and olives. Salted popcorn and pretzels.  The items listed above may not be a complete list of foods and beverages to avoid.

## 2017-07-27 LAB — LIPID PANEL
CHOLESTEROL: 179 mg/dL (ref <200)
HDL: 55 mg/dL (ref >50)
LDL Cholesterol (Calc): 101 mg/dL (calc) — ABNORMAL HIGH
Non-HDL Cholesterol (Calc): 124 mg/dL (calc) (ref <130)
TRIGLYCERIDES: 131 mg/dL (ref <150)
Total CHOL/HDL Ratio: 3.3 (calc) (ref <5.0)

## 2017-07-27 LAB — HEMOGLOBIN A1C
EAG (MMOL/L): 6 (calc)
Hgb A1c MFr Bld: 5.4 % of total Hgb (ref <5.7)
Mean Plasma Glucose: 108 (calc)

## 2017-07-27 LAB — CBC WITH DIFFERENTIAL/PLATELET
BASOS ABS: 42 {cells}/uL (ref 0–200)
Basophils Relative: 0.7 %
EOS ABS: 132 {cells}/uL (ref 15–500)
Eosinophils Relative: 2.2 %
HCT: 38.7 % (ref 35.0–45.0)
Hemoglobin: 13 g/dL (ref 11.7–15.5)
Lymphs Abs: 2454 cells/uL (ref 850–3900)
MCH: 29.9 pg (ref 27.0–33.0)
MCHC: 33.6 g/dL (ref 32.0–36.0)
MCV: 89 fL (ref 80.0–100.0)
MONOS PCT: 7 %
MPV: 11.2 fL (ref 7.5–12.5)
NEUTROS PCT: 49.2 %
Neutro Abs: 2952 cells/uL (ref 1500–7800)
PLATELETS: 270 10*3/uL (ref 140–400)
RBC: 4.35 10*6/uL (ref 3.80–5.10)
RDW: 11.6 % (ref 11.0–15.0)
TOTAL LYMPHOCYTE: 40.9 %
WBC: 6 10*3/uL (ref 3.8–10.8)
WBCMIX: 420 {cells}/uL (ref 200–950)

## 2017-07-27 LAB — COMPLETE METABOLIC PANEL WITH GFR
AG Ratio: 1.8 (calc) (ref 1.0–2.5)
ALT: 18 U/L (ref 6–29)
AST: 23 U/L (ref 10–35)
Albumin: 4.4 g/dL (ref 3.6–5.1)
Alkaline phosphatase (APISO): 115 U/L (ref 33–130)
BILIRUBIN TOTAL: 0.4 mg/dL (ref 0.2–1.2)
BUN: 10 mg/dL (ref 7–25)
CHLORIDE: 102 mmol/L (ref 98–110)
CO2: 28 mmol/L (ref 20–32)
Calcium: 10 mg/dL (ref 8.6–10.4)
Creat: 0.97 mg/dL (ref 0.50–0.99)
GFR, Est African American: 73 mL/min/{1.73_m2} (ref >=60)
GFR, Est Non African American: 63 mL/min/{1.73_m2} (ref >=60)
GLUCOSE: 112 mg/dL — AB (ref 65–99)
Globulin: 2.5 g/dL (calc) (ref 1.9–3.7)
Potassium: 4.5 mmol/L (ref 3.5–5.3)
Sodium: 139 mmol/L (ref 135–146)
Total Protein: 6.9 g/dL (ref 6.1–8.1)

## 2017-07-27 LAB — TSH: TSH: 2 mIU/L (ref 0.40–4.50)

## 2017-07-27 LAB — URINALYSIS, ROUTINE W REFLEX MICROSCOPIC
Bacteria, UA: NONE SEEN /HPF
Bilirubin Urine: NEGATIVE
GLUCOSE, UA: NEGATIVE
HYALINE CAST: NONE SEEN /LPF
Hgb urine dipstick: NEGATIVE
Ketones, ur: NEGATIVE
Nitrite: NEGATIVE
PROTEIN: NEGATIVE
RBC / HPF: NONE SEEN /HPF (ref 0–2)
SPECIFIC GRAVITY, URINE: 1.014 (ref 1.001–1.03)
SQUAMOUS EPITHELIAL / LPF: NONE SEEN /HPF (ref <=5)
pH: 5.5 (ref 5.0–8.0)

## 2017-07-27 LAB — VITAMIN D 25 HYDROXY (VIT D DEFICIENCY, FRACTURES): VIT D 25 HYDROXY: 65 ng/mL (ref 30–100)

## 2017-07-27 LAB — MICROALBUMIN / CREATININE URINE RATIO
CREATININE, URINE: 145 mg/dL (ref 20–275)
MICROALB/CREAT RATIO: 17 ug/mg{creat} (ref <30)
Microalb, Ur: 2.5 mg/dL

## 2017-07-27 LAB — INSULIN, RANDOM: Insulin: 37 u[IU]/mL — ABNORMAL HIGH (ref 2.0–19.6)

## 2017-07-27 LAB — MAGNESIUM: Magnesium: 2.2 mg/dL (ref 1.5–2.5)

## 2017-07-28 DIAGNOSIS — M4326 Fusion of spine, lumbar region: Secondary | ICD-10-CM | POA: Diagnosis not present

## 2017-08-17 ENCOUNTER — Ambulatory Visit: Payer: Medicare Other

## 2017-08-25 DIAGNOSIS — M4326 Fusion of spine, lumbar region: Secondary | ICD-10-CM | POA: Diagnosis not present

## 2017-09-08 ENCOUNTER — Ambulatory Visit
Admission: RE | Admit: 2017-09-08 | Discharge: 2017-09-08 | Disposition: A | Discharge status: Home / Self Care | Payer: Medicare Other | Source: Ambulatory Visit | Attending: Internal Medicine | Admitting: Internal Medicine

## 2017-09-08 DIAGNOSIS — F17200 Nicotine dependence, unspecified, uncomplicated: Secondary | ICD-10-CM

## 2017-09-08 DIAGNOSIS — F17219 Nicotine dependence, cigarettes, with unspecified nicotine-induced disorders: Secondary | ICD-10-CM

## 2017-09-08 DIAGNOSIS — Z87891 Personal history of nicotine dependence: Secondary | ICD-10-CM

## 2017-09-08 DIAGNOSIS — Z122 Encounter for screening for malignant neoplasm of respiratory organs: Secondary | ICD-10-CM

## 2017-09-09 ENCOUNTER — Other Ambulatory Visit: Payer: Self-pay

## 2017-09-09 ENCOUNTER — Emergency Department (HOSPITAL_COMMUNITY)
Admission: EM | Admit: 2017-09-09 | Discharge: 2017-09-09 | Disposition: A | Discharge status: Home / Self Care | Payer: Medicare Other | Attending: Emergency Medicine | Admitting: Emergency Medicine

## 2017-09-09 ENCOUNTER — Encounter (HOSPITAL_COMMUNITY): Payer: Self-pay | Admitting: Emergency Medicine

## 2017-09-09 ENCOUNTER — Other Ambulatory Visit: Payer: Self-pay | Admitting: Internal Medicine

## 2017-09-09 DIAGNOSIS — K0889 Other specified disorders of teeth and supporting structures: Secondary | ICD-10-CM | POA: Diagnosis not present

## 2017-09-09 DIAGNOSIS — J449 Chronic obstructive pulmonary disease, unspecified: Secondary | ICD-10-CM | POA: Diagnosis not present

## 2017-09-09 DIAGNOSIS — I700 Atherosclerosis of aorta: Secondary | ICD-10-CM | POA: Insufficient documentation

## 2017-09-09 DIAGNOSIS — I129 Hypertensive chronic kidney disease with stage 1 through stage 4 chronic kidney disease, or unspecified chronic kidney disease: Secondary | ICD-10-CM | POA: Diagnosis not present

## 2017-09-09 DIAGNOSIS — Z79899 Other long term (current) drug therapy: Secondary | ICD-10-CM | POA: Diagnosis not present

## 2017-09-09 DIAGNOSIS — N182 Chronic kidney disease, stage 2 (mild): Secondary | ICD-10-CM | POA: Diagnosis not present

## 2017-09-09 DIAGNOSIS — K029 Dental caries, unspecified: Secondary | ICD-10-CM | POA: Insufficient documentation

## 2017-09-09 DIAGNOSIS — Z87891 Personal history of nicotine dependence: Secondary | ICD-10-CM | POA: Diagnosis not present

## 2017-09-09 MED ORDER — PENICILLIN V POTASSIUM 500 MG PO TABS: 500.0000 mg | ORAL_TABLET | Freq: Three times a day (TID) | ORAL | 0 refills | Status: AC

## 2017-09-09 NOTE — ED Provider Notes (Signed)
[facility] DEPT Provider Note  CSN: [id number] Arrival date & time: 09/09/17  1842  History   Chief Complaint Chief Complaint  Patient presents with  . Dental Pain    HPI Amber Hickman is a 62 y.o. female with a medical history of Type 2 DM, COPD, HTN, AVN and CKD who presented to the ED for dental pain x3 months. Endorses that this dental pain on left lower side has worsened over the last 2-3 days. Endorses chin pain. Denies fever, chills, facial swelling or warmth at the site. Patient has tried topical analgesic prior to coming to the ED which was useful when the pain first began months ago, but has not been effective recently. Patient's last dental appointment was in 03/2017 and has an appointment scheduled for Monday 09/12/17 for this tooth.  Past Medical History:  Diagnosis Date  . Allergy   . Arthritis   . AVN (avascular necrosis of bone) (HCC)    Left shoulder  . COPD (chronic obstructive pulmonary disease) ([place])   . Depression   . Diabetes mellitus without complication (HCC)    Pre-diabetes.   Marland Kitchen GERD (gastroesophageal reflux disease)   . Hyperlipidemia   . Hypertension   . Memory disorder 12/23/2016  . Vitamin D deficiency     Patient Active Problem List   Diagnosis Date Noted  . Aortic atherosclerosis ([place]) 09/09/2017  . Memory disorder 12/23/2016  . OSA and COPD overlap syndrome ([place]) 10/13/2016  . Depression, major, recurrent, in partial remission ([place]) 10/12/2016  . HTN (hypertension) 06/05/2015  . DDD, lumbar 05/21/2014  . Chronic pain syndrome 05/21/2014  . CKD (chronic kidney disease) stage 2, GFR 60-89 ml/min 02/11/2014  . Tobacco use disorder 02/11/2014  . Medication management 11/01/2013  . COPD (chronic obstructive pulmonary disease) ([place]) 04/27/2013  . Hyperlipidemia   . Other abnormal glucose   . Vitamin D deficiency     Past Surgical History:  Procedure Laterality Date  . ANKLE SURGERY Right   . BREAST  EXCISIONAL BIOPSY Right   . CATARACT EXTRACTION Bilateral   . SPINE SURGERY     lumbar  . TONSILLECTOMY AND ADENOIDECTOMY       OB History   None      Home Medications    Prior to Admission medications   Medication Sig Start Date End Date Taking? Authorizing Provider  baclofen (LIORESAL) 10 MG tablet Take 10 mg 3 (three) times daily as needed by mouth for muscle spasms.    [provider]  cholecalciferol 5000 units TABS Take 2 tablets daily 01/19/17   Unk Pinto, MD  FLUoxetine (PROZAC) 40 MG capsule Take 1 capsule daily 01/19/17   Unk Pinto, MD  oxyCODONE (ROXICODONE) 15 MG immediate release tablet 15 mg every 6 (six) hours as needed. 07/04/17   [provider]  penicillin v potassium (VEETID) 500 MG tablet Take 1 tablet (500 mg total) by mouth 3 (three) times daily for 5 days. 09/09/17 09/14/17  Laden Fieldhouse, Alvie Heidelberg I, PA-C  simvastatin (ZOCOR) 40 MG tablet take 1 tablet by mouth at bedtime 09/23/16   Unk Pinto, MD    Family History Family History  Problem Relation Age of Onset  . Hypertension Mother   . COPD Mother   . Stroke Mother   . Cancer Mother        breast  . Heart disease Father        Age 45, MI  . Breast cancer Neg Hx  Social History Social History   Tobacco Use  . Smoking status: Former Smoker    Packs/day: 1.00    Years: 30.00    Pack years: 30.00    Last attempt to quit: 05/10/2017    Years since quitting: 0.3  . Smokeless tobacco: Never Used  Substance Use Topics  . Alcohol use: No  . Drug use: No     Allergies   Augmentin [amoxicillin-pot clavulanate]; Codeine; Lipitor [atorvastatin]; Wellbutrin [bupropion]; and Zoloft [sertraline hcl]   Review of Systems Review of Systems  Constitutional: Negative for chills and fever.  HENT: Positive for dental problem. Negative for drooling, ear pain and facial swelling.   Eyes: Negative for pain.  Skin: Negative for color change.  Neurological: Negative for facial  asymmetry, speech difficulty, numbness and headaches.     Physical Exam Updated Vital Signs BP 134/88 (BP Location: Right Arm)   Pulse 81   Temp 99.6 F (37.6 C) (Oral)   Resp 18   Ht 5\' 4"  (1.626 m)   Wt 68.9 kg (152 lb)   SpO2 99%   BMI 26.09 kg/m   Physical Exam  Constitutional: Vital signs are normal. She appears well-developed and well-nourished.  HENT:  Head: Normocephalic and atraumatic.  Right Ear: Hearing, tympanic membrane, external ear and ear canal normal.  Left Ear: Hearing, tympanic membrane, external ear and ear canal normal.  Mouth/Throat: Oropharynx is clear and moist and mucous membranes are normal. Abnormal dentition. Dental caries present. No dental abscesses.    Several teeth missing. Remaining teeth are discolored with caries and root exposure. Tooth #22 tender to palpation. Root exposed and crack seen at apex near gingiva. No gingival edema or erythema. No areas of fluctuance in oral cavity or on face.  Nursing note and vitals reviewed.    ED Treatments / Results  Labs (all labs ordered are listed, but only abnormal results are displayed) Labs Reviewed - No data to display  EKG None  Radiology Ct Chest Lung Ca Screen Low Dose W/o Cm  Result Date: 09/09/2017 CLINICAL DATA:  62 year old asymptomatic female former smoker with 40 pack-year smoking history, quit smoking 4 months prior. EXAM: CT CHEST WITHOUT CONTRAST LOW-DOSE FOR LUNG CANCER SCREENING TECHNIQUE: Multidetector CT imaging of the chest was performed following the standard protocol without IV contrast. COMPARISON:  07/14/2016 screening chest CT. FINDINGS: Cardiovascular: Normal heart size. No significant pericardial effusion/thickening. Left anterior descending coronary atherosclerosis. Atherosclerotic nonaneurysmal thoracic aorta. Normal caliber pulmonary arteries. Mediastinum/Nodes: No discrete thyroid nodules. Unremarkable esophagus. No pathologically enlarged axillary, mediastinal or hilar  lymph nodes, noting limited sensitivity for the detection of hilar adenopathy on this noncontrast study. Lungs/Pleura: No pneumothorax. No pleural effusion. Moderate centrilobular emphysema with mild diffuse bronchial wall thickening. No acute consolidative airspace disease or lung masses. No significant growth of the previously visualized tiny solid pulmonary nodule in the right middle lobe. No new significant pulmonary nodules. Upper abdomen: No acute abnormality. Musculoskeletal: No aggressive appearing focal osseous lesions. Moderate thoracic spondylosis. IMPRESSION: 1. Lung-RADS 2, benign appearance or behavior. Continue annual screening with low-dose chest CT without contrast in 12 months. 2. One vessel coronary atherosclerosis. Aortic Atherosclerosis (ICD10-I70.0) and Emphysema (ICD10-J43.9). Electronically Signed   By: Ilona Sorrel M.D.   On: 09/09/2017 09:07    Procedures Procedures (including critical care time)  Medications Ordered in ED Medications - No data to display   Initial Impression / Assessment and Plan / ED Course  Triage vital signs and the nursing notes have been reviewed.  Pertinent labs & imaging results that were available during care of the patient were reviewed and considered in medical decision making (see chart for details).   Patient presents in no acute distress. She is afebrile and his vital signs were normal. Patient denies systemic s/s that would suggest a widespread infection. On physical exam, there is no erythema, swelling, induration or fluctuatnce in the mouth or face that would suggest an acute dental infection or abscess. However, several of the patient's teeth are decaying. Tooth # has the root exposed with caries. She likely has an apical infection that cannot be visualized. Will prescribe prophylactic antibiotics. Patient has an appointment with her dentist scheduled for Monday 09/12/17.  Final Clinical Impressions(s) / ED Diagnoses  1. Dental Pain.  Prophylactic antibiotic prescribed. Education on proper Human resources officer. Patient has scheduled appointment with her dentist for Monday 09/12/17.  Dispo: Home. After thorough clinical evaluation, this patient is determined to be medically stable and can be safely discharged with the previously mentioned treatment and/or outpatient follow-up/referral(s). At this time, there are no other apparent medical conditions that require further screening, evaluation or treatment.  Final diagnoses:  Pain, dental    ED Discharge Orders        Ordered    penicillin v potassium (VEETID) 500 MG tablet  3 times daily     09/09/17 2016        Junita Push 09/09/17 2032    Isla Pence, MD 09/09/17 2315

## 2017-09-09 NOTE — ED Notes (Signed)
Pt is alert and oriented x 4 and is verbally responsive. Pt reports that she has a small abscess to the left side of her chin and has left lower tooth pain. Pt reports that she has a dentist appt on Monday but pain has been unbearable and could not wait.  Tooth appears to be chipped and decayed. Poor oral health is noted

## 2017-09-09 NOTE — ED Triage Notes (Signed)
Patient here from home with complaints of left side dental pain. Reports that she has an appointment with a dentist on Monday but pain is "too much".

## 2017-09-09 NOTE — Discharge Instructions (Addendum)
I have prescribed you antibiotics for your tooth. It is ok for you to take Tylenol (650mg ) 3-4 times daily for pain if you need it. Applying a warm compress to the side of your face may help as well.  Good luck with your dentist appointment on Monday!

## 2017-09-13 ENCOUNTER — Other Ambulatory Visit: Payer: Self-pay

## 2017-09-21 DIAGNOSIS — E119 Type 2 diabetes mellitus without complications: Secondary | ICD-10-CM | POA: Insufficient documentation

## 2017-09-22 DIAGNOSIS — K635 Polyp of colon: Secondary | ICD-10-CM | POA: Diagnosis not present

## 2017-09-22 DIAGNOSIS — K648 Other hemorrhoids: Secondary | ICD-10-CM | POA: Diagnosis not present

## 2017-09-22 DIAGNOSIS — D126 Benign neoplasm of colon, unspecified: Secondary | ICD-10-CM | POA: Diagnosis not present

## 2017-09-22 DIAGNOSIS — Z1211 Encounter for screening for malignant neoplasm of colon: Secondary | ICD-10-CM | POA: Diagnosis not present

## 2017-09-22 DIAGNOSIS — D124 Benign neoplasm of descending colon: Secondary | ICD-10-CM | POA: Diagnosis not present

## 2017-09-22 LAB — HM COLONOSCOPY

## 2017-09-26 ENCOUNTER — Encounter: Payer: Self-pay | Admitting: Internal Medicine

## 2017-09-28 ENCOUNTER — Other Ambulatory Visit: Payer: Self-pay | Admitting: Internal Medicine

## 2017-09-29 DIAGNOSIS — M4326 Fusion of spine, lumbar region: Secondary | ICD-10-CM | POA: Diagnosis not present

## 2017-09-30 ENCOUNTER — Observation Stay (HOSPITAL_COMMUNITY)
Admission: EM | Admit: 2017-09-30 | Discharge: 2017-10-01 | Disposition: A | Discharge status: Home / Self Care | Payer: Medicare Other | Attending: Family Medicine | Admitting: Family Medicine

## 2017-09-30 ENCOUNTER — Other Ambulatory Visit: Payer: Self-pay

## 2017-09-30 ENCOUNTER — Emergency Department (HOSPITAL_COMMUNITY): Payer: Medicare Other

## 2017-09-30 ENCOUNTER — Encounter (HOSPITAL_COMMUNITY): Payer: Self-pay | Admitting: Emergency Medicine

## 2017-09-30 DIAGNOSIS — I10 Essential (primary) hypertension: Secondary | ICD-10-CM | POA: Diagnosis not present

## 2017-09-30 DIAGNOSIS — G894 Chronic pain syndrome: Secondary | ICD-10-CM | POA: Insufficient documentation

## 2017-09-30 DIAGNOSIS — W19XXXA Unspecified fall, initial encounter: Secondary | ICD-10-CM | POA: Diagnosis not present

## 2017-09-30 DIAGNOSIS — R55 Syncope and collapse: Secondary | ICD-10-CM | POA: Diagnosis not present

## 2017-09-30 DIAGNOSIS — Z79899 Other long term (current) drug therapy: Secondary | ICD-10-CM | POA: Diagnosis not present

## 2017-09-30 DIAGNOSIS — G459 Transient cerebral ischemic attack, unspecified: Secondary | ICD-10-CM | POA: Diagnosis not present

## 2017-09-30 DIAGNOSIS — J449 Chronic obstructive pulmonary disease, unspecified: Secondary | ICD-10-CM | POA: Insufficient documentation

## 2017-09-30 DIAGNOSIS — R112 Nausea with vomiting, unspecified: Secondary | ICD-10-CM | POA: Diagnosis not present

## 2017-09-30 DIAGNOSIS — R531 Weakness: Secondary | ICD-10-CM | POA: Diagnosis not present

## 2017-09-30 DIAGNOSIS — R4781 Slurred speech: Secondary | ICD-10-CM | POA: Diagnosis not present

## 2017-09-30 DIAGNOSIS — I639 Cerebral infarction, unspecified: Secondary | ICD-10-CM | POA: Diagnosis not present

## 2017-09-30 DIAGNOSIS — E119 Type 2 diabetes mellitus without complications: Secondary | ICD-10-CM | POA: Insufficient documentation

## 2017-09-30 DIAGNOSIS — R0902 Hypoxemia: Secondary | ICD-10-CM | POA: Diagnosis not present

## 2017-09-30 HISTORY — DX: Transient cerebral ischemic attack, unspecified: G45.9

## 2017-09-30 LAB — I-STAT CHEM 8, ED
BUN: 17 mg/dL (ref 8–23)
CALCIUM ION: 1.22 mmol/L (ref 1.15–1.40)
Chloride: 102 mmol/L (ref 98–111)
Creatinine, Ser: 0.9 mg/dL (ref 0.44–1.00)
GLUCOSE: 95 mg/dL (ref 70–99)
HCT: 39 % (ref 36.0–46.0)
HEMOGLOBIN: 13.3 g/dL (ref 12.0–15.0)
POTASSIUM: 4.1 mmol/L (ref 3.5–5.1)
SODIUM: 140 mmol/L (ref 135–145)
TCO2: 29 mmol/L (ref 22–32)

## 2017-09-30 LAB — CBC WITH DIFFERENTIAL/PLATELET
Abs Immature Granulocytes: 0 10*3/uL (ref 0.0–0.1)
BASOS ABS: 0 10*3/uL (ref 0.0–0.1)
BASOS PCT: 0 %
Eosinophils Absolute: 0.1 10*3/uL (ref 0.0–0.7)
Eosinophils Relative: 1 %
HCT: 39.8 % (ref 36.0–46.0)
Hemoglobin: 12.5 g/dL (ref 12.0–15.0)
IMMATURE GRANULOCYTES: 0 %
LYMPHS PCT: 14 %
Lymphs Abs: 1.5 10*3/uL (ref 0.7–4.0)
MCH: 28.7 pg (ref 26.0–34.0)
MCHC: 31.4 g/dL (ref 30.0–36.0)
MCV: 91.5 fL (ref 78.0–100.0)
Monocytes Absolute: 0.7 10*3/uL (ref 0.1–1.0)
Monocytes Relative: 7 %
NEUTROS ABS: 7.8 10*3/uL — AB (ref 1.7–7.7)
NEUTROS PCT: 78 %
PLATELETS: 272 10*3/uL (ref 150–400)
RBC: 4.35 MIL/uL (ref 3.87–5.11)
RDW: 13.8 % (ref 11.5–15.5)
WBC: 10.1 10*3/uL (ref 4.0–10.5)

## 2017-09-30 LAB — BASIC METABOLIC PANEL
ANION GAP: 11 (ref 5–15)
BUN: 12 mg/dL (ref 8–23)
CO2: 26 mmol/L (ref 22–32)
Calcium: 9.5 mg/dL (ref 8.9–10.3)
Chloride: 105 mmol/L (ref 98–111)
Creatinine, Ser: 1.02 mg/dL — ABNORMAL HIGH (ref 0.44–1.00)
GFR, EST NON AFRICAN AMERICAN: 58 mL/min — AB (ref >60)
Glucose, Bld: 96 mg/dL (ref 70–99)
POTASSIUM: 4.1 mmol/L (ref 3.5–5.1)
SODIUM: 142 mmol/L (ref 135–145)

## 2017-09-30 LAB — TROPONIN I

## 2017-09-30 MED ORDER — STROKE: EARLY STAGES OF RECOVERY BOOK
Freq: Once | Status: AC
  Administered 2017-10-01: 1
  Filled 2017-09-30 (×2): qty 1

## 2017-09-30 MED ORDER — IOPAMIDOL (ISOVUE-370) INJECTION 76%
50.0000 mL | Freq: Once | INTRAVENOUS | Status: AC | PRN
  Administered 2017-09-30: 50 mL via INTRAVENOUS

## 2017-09-30 MED ORDER — ACETAMINOPHEN 160 MG/5ML PO SOLN: 650.0000 mg | ORAL | Status: DC | PRN

## 2017-09-30 MED ORDER — SIMVASTATIN 20 MG PO TABS
20.0000 mg | ORAL_TABLET | Freq: Every day | ORAL | Status: DC
  Administered 2017-10-01: 20 mg via ORAL
  Filled 2017-09-30: qty 1

## 2017-09-30 MED ORDER — ENOXAPARIN SODIUM 40 MG/0.4ML ~~LOC~~ SOLN
40.0000 mg | Freq: Every day | SUBCUTANEOUS | Status: DC
  Administered 2017-10-01: 40 mg via SUBCUTANEOUS
  Filled 2017-09-30: qty 0.4

## 2017-09-30 MED ORDER — ACETAMINOPHEN 325 MG PO TABS: 650.0000 mg | ORAL_TABLET | ORAL | Status: DC | PRN

## 2017-09-30 MED ORDER — ASPIRIN 300 MG RE SUPP: 300.0000 mg | Freq: Every day | RECTAL | Status: DC

## 2017-09-30 MED ORDER — IOPAMIDOL (ISOVUE-370) INJECTION 76%
INTRAVENOUS | Status: AC
  Filled 2017-09-30: qty 50

## 2017-09-30 MED ORDER — SENNOSIDES-DOCUSATE SODIUM 8.6-50 MG PO TABS: 1.0000 | ORAL_TABLET | Freq: Every evening | ORAL | Status: DC | PRN

## 2017-09-30 MED ORDER — OXYCODONE HCL 5 MG PO TABS
15.0000 mg | ORAL_TABLET | Freq: Four times a day (QID) | ORAL | Status: DC | PRN
  Administered 2017-10-01 (×3): 15 mg via ORAL
  Filled 2017-09-30 (×3): qty 3

## 2017-09-30 MED ORDER — FLUOXETINE HCL 20 MG PO CAPS
40.0000 mg | ORAL_CAPSULE | Freq: Every day | ORAL | Status: DC
  Administered 2017-10-01: 40 mg via ORAL
  Filled 2017-09-30: qty 2

## 2017-09-30 MED ORDER — ASPIRIN 325 MG PO TABS
325.0000 mg | ORAL_TABLET | Freq: Every day | ORAL | Status: DC
  Administered 2017-09-30 – 2017-10-01 (×2): 325 mg via ORAL
  Filled 2017-09-30 (×2): qty 1

## 2017-09-30 MED ORDER — ACETAMINOPHEN 650 MG RE SUPP: 650.0000 mg | RECTAL | Status: DC | PRN

## 2017-09-30 MED ORDER — LACTATED RINGERS IV BOLUS
1000.0000 mL | Freq: Once | INTRAVENOUS | Status: AC
  Administered 2017-09-30: 1000 mL via INTRAVENOUS

## 2017-09-30 MED ORDER — KETOTIFEN FUMARATE 0.025 % OP SOLN: 1.0000 [drp] | Freq: Three times a day (TID) | OPHTHALMIC | Status: DC | PRN

## 2017-09-30 MED ORDER — BACLOFEN 10 MG PO TABS
10.0000 mg | ORAL_TABLET | Freq: Three times a day (TID) | ORAL | Status: DC | PRN
  Administered 2017-10-01 (×3): 10 mg via ORAL
  Filled 2017-09-30 (×4): qty 1

## 2017-09-30 NOTE — ED Notes (Signed)
Pt returned from MRI °

## 2017-09-30 NOTE — Consult Note (Addendum)
Neurology Consultation  Reason for Consult: Slurred speech, left arm weakness, near syncope Referring Physician: Dr. Adrian Prows  CC: Slurred speech, left arm weakness, near syncope  History is obtained from: Patient, chart  HPI: Amber Hickman is a 62 y.o. female past medical history of multiple back and orthopedic surgeries, hypertension, hyperlipidemia, avascular necrosis of multiple joints, presented to the emergency room for evaluation of above-mentioned symptoms of left-sided weakness, slurred speech and near syncope. She said that her last known normal was around 11 AM, when she was working in her yard and felt pretty hot and dehydrated.  She came in inside the house, took a nap, woke up but still felt very rundown.  When she woke up again, she found it very hard to get up.  She almost had a fall without losing consciousness when she went down her knees in the kitchen.  When she attempted to speak to her dogs, her speech was slurred.  She also felt dizzy as in lightheaded.  She also noticed that her left arm was tingly and numb as well as weak. The symptoms lasted for about 20-30 minutes.  She came to the ER upon guidance from a friend who is a Marine scientist.  Her neurological examination on arrival was completely normal. Neurological consultation was obtained for concern for TIA. She has not had strokes in the past. There is family history of heart disease and death of her father at the age of 60 from heart disease.  No history of strokes in the family. Multiple family members have cancers. She quit smoking in March this year. Does not use illicit drug or alcohol.  LKW: 11 AM 09/30/2017 tpa given?: no, symptoms resolved, also neurology called after patient was outside the window. Premorbid modified Rankin scale (mRS): 0  ROS: ROS was performed and is negative except as noted in the HPI.   Past Medical History:  Diagnosis Date  . Allergy   . Arthritis   . AVN (avascular necrosis  of bone) (HCC)    Left shoulder  . COPD (chronic obstructive pulmonary disease) ([place])   . Depression   . Diabetes mellitus without complication (HCC)    Pre-diabetes.   Marland Kitchen GERD (gastroesophageal reflux disease)   . Hyperlipidemia   . Hypertension   . Memory disorder 12/23/2016  . Vitamin D deficiency     Family History  Problem Relation Age of Onset  . Hypertension Mother   . COPD Mother   . Stroke Mother   . Cancer Mother        breast  . Heart disease Father        Age 32, MI  . Breast cancer Neg Hx     Social History:   reports that she quit smoking about 4 months ago. She has a 30.00 pack-year smoking history. She has never used smokeless tobacco. She reports that she does not drink alcohol or use drugs.  Medications  Current Facility-Administered Medications:  .  iopamidol (ISOVUE-370) 76 % injection, , , ,   Current Outpatient Medications:  .  baclofen (LIORESAL) 10 MG tablet, Take 10 mg 3 (three) times daily as needed by mouth for muscle spasms., Disp: , Rfl:  .  cholecalciferol 5000 units TABS, Take 2 tablets daily, Disp: , Rfl:  .  FLUoxetine (PROZAC) 40 MG capsule, TAKE ONE CAPSULE BY MOUTH TWICE DAILY, Disp: 180 capsule, Rfl: 1 .  oxyCODONE (ROXICODONE) 15 MG immediate release tablet, 15 mg every 6 (six) hours as needed.,  Disp: , Rfl: 0 .  simvastatin (ZOCOR) 40 MG tablet, take 1 tablet by mouth at bedtime, Disp: 90 tablet, Rfl: 1  Exam: Current vital signs: BP (!) 150/75   Pulse 77   Temp 97.9 F (36.6 C)   Resp (!) 9   Ht 5\' 4"  (1.626 m)   Wt 68 kg   SpO2 97%   BMI 25.75 kg/m  Vital signs in last 24 hours: Temp:  [97.9 F (36.6 C)] 97.9 F (36.6 C) (08/16 1728) Pulse Rate:  [73-82] 77 (08/16 1800) Resp:  [9-26] 9 (08/16 1800) BP: (129-156)/(73-85) 150/75 (08/16 1800) SpO2:  [93 %-99 %] 97 % (08/16 1800) Weight:  [68 kg] 68 kg (08/16 1530) GENERAL: Awake, alert in NAD HEENT: - Normocephalic and atraumatic, dry mm, no LN++, no Thyromegally LUNGS  - Clear to auscultation bilaterally with no wheezes CV - S1S2 RRR, no m/r/g, equal pulses bilaterally. ABDOMEN - Soft, nontender, nondistended with normoactive BS Ext: war, well perfused, intact peripheral pulses, no edema NEURO:  Mental Status: AA&Ox3  Language: speech is not dysarthric.  Naming, repetition, fluency, and comprehension intact. Cranial Nerves: PERRL. EOMI, visual fields full, no facial asymmetry, facial sensation intact, hearing intact, tongue/uvula/soft palate midline, normal sternocleidomastoid and trapezius muscle strength. No evidence of tongue atrophy or fibrillations Motor: Symmetric 5/5 strength in all 4 extremities. Tone: is normal and bulk is normal Sensation- Intact to light touch bilaterally, no extinction Coordination: FTN intact bilaterally, no ataxia in BLE. Gait- deferred  NIHSS=0   Labs I have reviewed labs in epic and the results pertinent to this consultation are: CBC    Component Value Date/Time   WBC 10.1 09/30/2017 1617   RBC 4.35 09/30/2017 1617   HGB 13.3 09/30/2017 1730   HCT 39.0 09/30/2017 1730   PLT 272 09/30/2017 1617   MCV 91.5 09/30/2017 1617   MCH 28.7 09/30/2017 1617   MCHC 31.4 09/30/2017 1617   RDW 13.8 09/30/2017 1617   LYMPHSABS 1.5 09/30/2017 1617   MONOABS 0.7 09/30/2017 1617   EOSABS 0.1 09/30/2017 1617   BASOSABS 0.0 09/30/2017 1617    CMP     Component Value Date/Time   NA 140 09/30/2017 1730   K 4.1 09/30/2017 1730   CL 102 09/30/2017 1730   CO2 26 09/30/2017 1617   GLUCOSE 95 09/30/2017 1730   BUN 17 09/30/2017 1730   CREATININE 0.90 09/30/2017 1730   CREATININE 0.97 07/26/2017 1537   CALCIUM 9.5 09/30/2017 1617   PROT 6.9 07/26/2017 1537   ALBUMIN 4.1 07/05/2016 1548   AST 23 07/26/2017 1537   ALT 18 07/26/2017 1537   ALKPHOS 95 07/05/2016 1548   BILITOT 0.4 07/26/2017 1537   GFRNONAA 58 (L) 09/30/2017 1617   GFRNONAA 63 07/26/2017 1537   GFRAA >60 09/30/2017 1617   GFRAA 73 07/26/2017 1537    Imaging None available at the time.  Assessment:  62 year old woman past medical history of multiple back and arthritic surgeries, hypertension hyperlipidemia avascular necrosis of multiple joints COPD, who has been a long-term smoker recently quit, presented for evaluation of slurred speech, left arm weakness, near syncope-like presentation. Slurred speech and dysarthria are very nonlocalizing symptoms but left sided symptoms are concerning for a vascular process. Will order recommendations for stroke/TIA risk factor work-up. Other differentials include near syncope/lightheadedness from polypharmacy as she is on oxycodone and muscle relaxants.    Impression: Slurred speech Left-sided weakness Evaluate for stroke/TIA Hypertension Hyper lipidemia Polypharmacy  Recommendations: Noncontrast CT of the head  CT Angio of head and neck MRI of the brain without contrast Admit to telemetry Aspirin 325 Atorvastatin 80 2D echocardiogram Hemoglobin A1C, fasting lipid panel. Frequent neurochecks Permissive hypertension- treat only if systolic blood pressures are greater than 220 on a as needed basis for the first 24 to 48 hours. Minimize sedating medications Physical therapy, keep syncope, speech therapy N.p.o. until she passes swallow evaluation   Stroke team will follow the patient in the morning.  -- Amie Portland, MD [facility] Pager: [phone] If 7pm to 7am, please call on call as listed on AMION.

## 2017-09-30 NOTE — ED Notes (Signed)
Neurologist in to assess pt at this time

## 2017-09-30 NOTE — H&P (Signed)
History and Physical    Amber Hickman VEH:[id number] DOB: 04/28/1955 DOA: 09/30/2017  PCP: Unk Pinto, MD   Patient coming from: Home   Chief Complaint: Near-syncope, dysarthria, left arm weakness   HPI: Amber Hickman is a 62 y.o. female with medical history significant for chronic low back pain with radiculopathy and chronic right leg weakness, COPD, depression, and mild renal insufficiency, now presenting to the emergency department after an episode involving near syncope, dysarthria, and left arm weakness.  Patient had been working out in her yard in the heat today, had nausea with one episode of vomiting.  She went inside to rest, laid down, but upon standing she became lightheaded, feeling as though she would pass out but did not.  She was noted to have some dysarthria at that time and felt as though her left arm was weak.  She reports that all symptoms resolved within approximately 20 minutes.  She has a mild headache, similar to headache she has had in the past, but denies any change in vision or hearing.  Denies any recent fall or trauma.  No recent fevers or chills.  ED Course: Upon arrival to the ED, patient is found to be afebrile, saturating well on room air, and with vitals otherwise normal.  EKG features a sinus rhythm with RAD and nonspecific T wave abnormality, similar to priors.  Chest x-ray is negative for acute cardiopulmonary disease.  Noncontrast head CT is a normal study, CTA head and neck is negative for emergent LVO or hemodynamically significant stenosis, and MRI brain is negative for acute intracranial abnormality.  Patient was given a liter of normal saline and 325 mg of aspirin in the ED.  She passed the swallow screen in the ED per report of RN.  Neurology is consulting and much appreciated.  Medical admission for ongoing evaluation was recommended.  Review of Systems:  All other systems reviewed and apart from HPI, are negative.  Past Medical  History:  Diagnosis Date  . Allergy   . Arthritis   . AVN (avascular necrosis of bone) (HCC)    Left shoulder  . COPD (chronic obstructive pulmonary disease) ([place])   . Depression   . Diabetes mellitus without complication (HCC)    Pre-diabetes.   Marland Kitchen GERD (gastroesophageal reflux disease)   . Hyperlipidemia   . Hypertension   . Memory disorder 12/23/2016  . Vitamin D deficiency     Past Surgical History:  Procedure Laterality Date  . ANKLE SURGERY Right   . BACK SURGERY    . BREAST EXCISIONAL BIOPSY Right   . CATARACT EXTRACTION Bilateral   . SPINE SURGERY     lumbar  . TONSILLECTOMY AND ADENOIDECTOMY       reports that she quit smoking about 4 months ago. She has a 30.00 pack-year smoking history. She has never used smokeless tobacco. She reports that she does not drink alcohol or use drugs.  Allergies  Allergen Reactions  . Augmentin [Amoxicillin-Pot Clavulanate] Nausea And Vomiting  . Codeine Nausea And Vomiting  . Lipitor [Atorvastatin]     Leg pain  . Wellbutrin [Bupropion]     Pt does not recall reaction   . Zoloft [Sertraline Hcl]     Pt does not recall reaction     Family History  Problem Relation Age of Onset  . Hypertension Mother   . COPD Mother   . Stroke Mother   . Cancer Mother        breast  .  Heart disease Father        Age 41, MI  . Breast cancer Neg Hx      Prior to Admission medications   Medication Sig Start Date End Date Taking? Authorizing Provider  baclofen (LIORESAL) 10 MG tablet Take 10 mg 3 (three) times daily as needed by mouth for muscle spasms.   Yes [provider]  cholecalciferol 5000 units TABS Take 2 tablets daily 01/19/17  Yes Unk Pinto, MD  FLUoxetine (PROZAC) 40 MG capsule TAKE ONE CAPSULE BY MOUTH TWICE DAILY Patient taking differently: Take 40 mg by mouth daily.  09/28/17  Yes Unk Pinto, MD  ketotifen (ZADITOR) 0.025 % ophthalmic solution Place 1 drop into both eyes 3 (three) times daily as needed  (allergies).   Yes [provider]  oxyCODONE (ROXICODONE) 15 MG immediate release tablet Take 15 mg by mouth every 6 (six) hours as needed for pain.  07/04/17  Yes [provider]  simvastatin (ZOCOR) 40 MG tablet take 1 tablet by mouth at bedtime Patient taking differently: Take 20 mg by mouth every evening.  09/23/16  Yes Unk Pinto, MD    Physical Exam: Vitals:   09/30/17 1730 09/30/17 1745 09/30/17 1800 09/30/17 2215  BP: (!) 148/83 (!) 145/82 (!) 150/75 (!) 135/91  Pulse: 73 73 77 72  Resp: 17 (!) 9 (!) 9 (!) 8  Temp:      SpO2: 98% 96% 97% 96%  Weight:      Height:          Constitutional: NAD, calm  Eyes: PERTLA, lids and conjunctivae normal ENMT: Mucous membranes are moist. Posterior pharynx clear of any exudate or lesions.   Neck: normal, supple, no masses, no thyromegaly Respiratory: clear to auscultation bilaterally, no wheezing. No accessory muscle use.  Cardiovascular: S1 & S2 heard, regular rate and rhythm. No extremity edema.   Abdomen: No distension, no tenderness, soft. Bowel sounds normal.  Musculoskeletal: no clubbing / cyanosis. No joint deformity upper and lower extremities.   Skin: no significant rashes, lesions, ulcers. Warm, dry, well-perfused. Neurologic: CN 2-12 grossly intact. Sensation to light touch intact. Strength 5/5 in all 4 limbs.  Psychiatric: Alert and oriented x 3. Pleasant and cooperative.     Labs on Admission: I have personally reviewed following labs and imaging studies  CBC: Recent Labs  Lab 09/30/17 1617 09/30/17 1730  WBC 10.1  --   NEUTROABS 7.8*  --   HGB 12.5 13.3  HCT 39.8 39.0  MCV 91.5  --   PLT 272  --    Basic Metabolic Panel: Recent Labs  Lab 09/30/17 1617 09/30/17 1730  NA 142 140  K 4.1 4.1  CL 105 102  CO2 26  --   GLUCOSE 96 95  BUN 12 17  CREATININE 1.02* 0.90  CALCIUM 9.5  --    GFR: Estimated Creatinine Clearance: 61.4 mL/min (by C-G formula based on SCr of 0.9  mg/dL). Liver Function Tests: No results for input(s): AST, ALT, ALKPHOS, BILITOT, PROT, ALBUMIN in the last 168 hours. No results for input(s): LIPASE, AMYLASE in the last 168 hours. No results for input(s): AMMONIA in the last 168 hours. Coagulation Profile: No results for input(s): INR, PROTIME in the last 168 hours. Cardiac Enzymes: Recent Labs  Lab 09/30/17 1617  TROPONINI <0.03   BNP (last 3 results) No results for input(s): PROBNP in the last 8760 hours. HbA1C: No results for input(s): HGBA1C in the last 72 hours. CBG: No results for  input(s): GLUCAP in the last 168 hours. Lipid Profile: No results for input(s): CHOL, HDL, LDLCALC, TRIG, CHOLHDL, LDLDIRECT in the last 72 hours. Thyroid Function Tests: No results for input(s): TSH, T4TOTAL, FREET4, T3FREE, THYROIDAB in the last 72 hours. Anemia Panel: No results for input(s): VITAMINB12, FOLATE, FERRITIN, TIBC, IRON, RETICCTPCT in the last 72 hours. Urine analysis:    Component Value Date/Time   COLORURINE YELLOW 07/26/2017 1537   APPEARANCEUR CLEAR 07/26/2017 1537   LABSPEC 1.014 07/26/2017 1537   PHURINE 5.5 07/26/2017 1537   GLUCOSEU NEGATIVE 07/26/2017 1537   HGBUR NEGATIVE 07/26/2017 1537   BILIRUBINUR NEGATIVE 07/05/2016 1548   KETONESUR NEGATIVE 07/26/2017 1537   PROTEINUR NEGATIVE 07/26/2017 1537   NITRITE NEGATIVE 07/26/2017 1537   LEUKOCYTESUR 1+ (A) 07/26/2017 1537   Sepsis Labs: @LABRCNTIP (procalcitonin:4,lacticidven:4) )No results found for this or any previous visit (from the past 240 hour(s)).   Radiological Exams on Admission: Ct Angio Head W Or Wo Contrast  Result Date: 09/30/2017 CLINICAL DATA:  Slurred speech, left arm weakness and near syncope. EXAM: CT ANGIOGRAPHY HEAD AND NECK TECHNIQUE: Multidetector CT imaging of the head and neck was performed using the standard protocol during bolus administration of intravenous contrast. Multiplanar CT image reconstructions and MIPs were obtained to  evaluate the vascular anatomy. Carotid stenosis measurements (when applicable) are obtained utilizing NASCET criteria, using the distal internal carotid diameter as the denominator. CONTRAST:  27mL ISOVUE-370 IOPAMIDOL (ISOVUE-370) INJECTION 76% COMPARISON:  None. FINDINGS: CT HEAD FINDINGS Brain: There is no mass, hemorrhage or extra-axial collection. The size and configuration of the ventricles and extra-axial CSF spaces are normal. There is no acute or chronic infarction. The brain parenchyma is normal. Skull: The visualized skull base, calvarium and extracranial soft tissues are normal. Sinuses/Orbits: No fluid levels or advanced mucosal thickening of the visualized paranasal sinuses. No mastoid or middle ear effusion. The orbits are normal. CTA NECK FINDINGS AORTIC ARCH: There is no calcific atherosclerosis of the aortic arch. There is no aneurysm, dissection or hemodynamically significant stenosis of the visualized ascending aorta and aortic arch. Conventional 3 vessel aortic branching pattern. The visualized proximal subclavian arteries are widely patent. RIGHT CAROTID SYSTEM: --Common carotid artery: Widely patent origin without common carotid artery dissection or aneurysm. --Internal carotid artery: No dissection, occlusion or aneurysm. Mild atherosclerotic calcification at the carotid bifurcation without hemodynamically significant stenosis. --External carotid artery: No acute abnormality. LEFT CAROTID SYSTEM: --Common carotid artery: Widely patent origin without common carotid artery dissection or aneurysm. --Internal carotid artery:No dissection, occlusion or aneurysm. Mild atherosclerotic calcification at the carotid bifurcation without hemodynamically significant stenosis. --External carotid artery: No acute abnormality. VERTEBRAL ARTERIES: Left dominant configuration. Both origins are normal. No dissection, occlusion or flow-limiting stenosis to the vertebrobasilar confluence. SKELETON: There is no  bony spinal canal stenosis. No lytic or blastic lesion. OTHER NECK: Normal pharynx, larynx and major salivary glands. No cervical lymphadenopathy. Unremarkable thyroid gland. UPPER CHEST: Biapical emphysema. CTA HEAD FINDINGS ANTERIOR CIRCULATION: --Intracranial internal carotid arteries: Normal. --Anterior cerebral arteries: Normal. Both A1 segments are present. Patent anterior communicating artery. --Middle cerebral arteries: Normal. --Posterior communicating arteries: Absent bilaterally. POSTERIOR CIRCULATION: --Basilar artery: Normal. --Posterior cerebral arteries: Normal. --Superior cerebellar arteries: Normal. --Inferior cerebellar arteries: Normal anterior and posterior inferior cerebellar arteries. VENOUS SINUSES: As permitted by contrast timing, patent. ANATOMIC VARIANTS: None DELAYED PHASE: No parenchymal contrast enhancement. Review of the MIP images confirms the above findings. IMPRESSION: 1. Normal head CT. 2. No emergent large vessel occlusion or hemodynamically significant stenosis. 3. Mild  bilateral carotid bifurcation atherosclerotic calcification without stenosis. 4. 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 Electronically Signed   By: Ulyses Jarred M.D.   On: 09/30/2017 20:36   Dg Chest 2 View  Result Date: 09/30/2017 CLINICAL DATA:  Syncope episodes today while out in the sun. Family reports slurred speech. EXAM: CHEST - 2 VIEW COMPARISON:  None. FINDINGS: Normal mediastinum and cardiac silhouette. Normal pulmonary vasculature. No evidence of effusion, infiltrate, or pneumothorax. No acute bony abnormality. IMPRESSION: No acute cardiopulmonary process. Electronically Signed   By: Suzy Bouchard M.D.   On: 09/30/2017 16:31   Ct Angio Neck W And/or Wo Contrast  Result Date: 09/30/2017 CLINICAL DATA:  Slurred speech, left arm weakness and near syncope. EXAM: CT ANGIOGRAPHY HEAD AND NECK TECHNIQUE: Multidetector CT imaging of the head and neck was performed using the standard protocol during bolus  administration of intravenous contrast. Multiplanar CT image reconstructions and MIPs were obtained to evaluate the vascular anatomy. Carotid stenosis measurements (when applicable) are obtained utilizing NASCET criteria, using the distal internal carotid diameter as the denominator. CONTRAST:  16mL ISOVUE-370 IOPAMIDOL (ISOVUE-370) INJECTION 76% COMPARISON:  None. FINDINGS: CT HEAD FINDINGS Brain: There is no mass, hemorrhage or extra-axial collection. The size and configuration of the ventricles and extra-axial CSF spaces are normal. There is no acute or chronic infarction. The brain parenchyma is normal. Skull: The visualized skull base, calvarium and extracranial soft tissues are normal. Sinuses/Orbits: No fluid levels or advanced mucosal thickening of the visualized paranasal sinuses. No mastoid or middle ear effusion. The orbits are normal. CTA NECK FINDINGS AORTIC ARCH: There is no calcific atherosclerosis of the aortic arch. There is no aneurysm, dissection or hemodynamically significant stenosis of the visualized ascending aorta and aortic arch. Conventional 3 vessel aortic branching pattern. The visualized proximal subclavian arteries are widely patent. RIGHT CAROTID SYSTEM: --Common carotid artery: Widely patent origin without common carotid artery dissection or aneurysm. --Internal carotid artery: No dissection, occlusion or aneurysm. Mild atherosclerotic calcification at the carotid bifurcation without hemodynamically significant stenosis. --External carotid artery: No acute abnormality. LEFT CAROTID SYSTEM: --Common carotid artery: Widely patent origin without common carotid artery dissection or aneurysm. --Internal carotid artery:No dissection, occlusion or aneurysm. Mild atherosclerotic calcification at the carotid bifurcation without hemodynamically significant stenosis. --External carotid artery: No acute abnormality. VERTEBRAL ARTERIES: Left dominant configuration. Both origins are normal. No  dissection, occlusion or flow-limiting stenosis to the vertebrobasilar confluence. SKELETON: There is no bony spinal canal stenosis. No lytic or blastic lesion. OTHER NECK: Normal pharynx, larynx and major salivary glands. No cervical lymphadenopathy. Unremarkable thyroid gland. UPPER CHEST: Biapical emphysema. CTA HEAD FINDINGS ANTERIOR CIRCULATION: --Intracranial internal carotid arteries: Normal. --Anterior cerebral arteries: Normal. Both A1 segments are present. Patent anterior communicating artery. --Middle cerebral arteries: Normal. --Posterior communicating arteries: Absent bilaterally. POSTERIOR CIRCULATION: --Basilar artery: Normal. --Posterior cerebral arteries: Normal. --Superior cerebellar arteries: Normal. --Inferior cerebellar arteries: Normal anterior and posterior inferior cerebellar arteries. VENOUS SINUSES: As permitted by contrast timing, patent. ANATOMIC VARIANTS: None DELAYED PHASE: No parenchymal contrast enhancement. Review of the MIP images confirms the above findings. IMPRESSION: 1. Normal head CT. 2. No emergent large vessel occlusion or hemodynamically significant stenosis. 3. Mild bilateral carotid bifurcation atherosclerotic calcification without stenosis. 4. 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 Electronically Signed   By: Ulyses Jarred M.D.   On: 09/30/2017 20:36   Mr Brain Wo Contrast  Result Date: 09/30/2017 CLINICAL DATA:  Stroke follow-up EXAM: MRI HEAD WITHOUT CONTRAST TECHNIQUE: Multiplanar, multiecho pulse sequences of the brain and surrounding structures were obtained without  intravenous contrast. COMPARISON:  Head CT 09/30/2017 Brain MRI 01/02/2017 FINDINGS: BRAIN: There is no acute infarct, acute hemorrhage or mass effect. The midline structures are normal. There are no old infarcts. Multifocal white matter hyperintensity, most commonly due to chronic ischemic microangiopathy. The CSF spaces are normal for age, with no hydrocephalus. Susceptibility-sensitive sequences show no  chronic microhemorrhage or superficial siderosis. VASCULAR: Major intracranial arterial and venous sinus flow voids are preserved. SKULL AND UPPER CERVICAL SPINE: The visualized skull base, calvarium, upper cervical spine and extracranial soft tissues are normal. SINUSES/ORBITS: No fluid levels or advanced mucosal thickening. No mastoid or middle ear effusion. The orbits are normal. IMPRESSION: Chronic ischemic microangiopathy without acute intracranial abnormality. Electronically Signed   By: Ulyses Jarred M.D.   On: 09/30/2017 21:26    EKG: Independently reviewed. Sinus rhythm, RAD, non-specific T-wave abnormality, not significantly different than priors.   Assessment/Plan   1. Transient neurologic deficits  - Presents with acute-onset of near-syncope with dysarthria and left-sided weakness, resolving within ~20 minutes  - Neurology consulting and much appreciated; left-arm weakness concerning for possible TIA/CVA  - Head CT normal, CTA head and neck without emergent LVO or hemodynamically significant stenosis, and MRI brain negative for acute intracranial abnormality  - Continue cardiac monitoring, frequent neuro checks, PT/OT/SLP evals  - Check echocardiogram, fasting lipids, and A1c  - Start ASA 325, continue statin, minimize sedating meds    2. COPD  - No SOB or wheezing on admission  - Continue prn albuterol   3. Chronic pain  - Continue home regimen with as-needed oxycodone and baclofen, minimize as much as possible given the acute neurologic deficits     DVT prophylaxis: Lovenox Code Status: Full  Family Communication: Discussed with patient  Consults called: Neurology  Admission status: Observation     Vianne Bulls, MD [facility] Pager [phone]  If 7PM-7AM, please contact night-coverage [email or web address] Password [facility] [employer]  09/30/2017, 10:46 PM

## 2017-09-30 NOTE — ED Triage Notes (Signed)
Pt to ED via [facility] after reported having a near syncopal episode.  Pt st's she had been working out in the hot sun doing yard work this morning, after coming into the house she laid down, when she got up she had a near syncopal episode but did not pass out.  Pt st's at that time she felt weak on the left side with some slurred speech.  Pt st's symptoms lasted approx 15 mins then subsided.  Pt was also nauseated and vomited while working in the yard.  EMS gave pt Zofran 4mg  IV and now pt st's she feels much better.  Pt alert and oriented x's 4.

## 2017-09-30 NOTE — ED Notes (Signed)
Pt MRI at this time

## 2017-09-30 NOTE — ED Provider Notes (Signed)
[facility] EMERGENCY DEPARTMENT Provider Note   CSN: [id number] Arrival date & time: 09/30/17  1524     History   Chief Complaint Chief Complaint  Patient presents with  . Near Syncope    HPI Amber Hickman is a 62 y.o. female with hypertension, hyperlipidemia, diabetes, and COPD who presents due to weakness, presyncope, and slurred speech.  She says that she was working in the hot weather out in the yard when she felt very weak, nauseous, and dizzy/off balance.  She went inside, she continued to feel off balance and laid on the sofa.  She said that she noticed that her speech sounded slurred.  She called a friend, who is a Marine scientist.  The nurse recommended she come to the ED due to concern for possible stroke.  She says that this all happened around 1-1:15 and the entire episode of slurred speech and feeling unbalanced lasted for approximately 15 to 20 minutes.  She says she now feels completely back to baseline except for feeling weak and tired.  She does not take any blood thinners.  She has no history of TIA or stroke that she is aware of.  HPI  Past Medical History:  Diagnosis Date  . Allergy   . Arthritis   . AVN (avascular necrosis of bone) (HCC)    Left shoulder  . COPD (chronic obstructive pulmonary disease) ([place])   . Depression   . Diabetes mellitus without complication (HCC)    Pre-diabetes.   Marland Kitchen GERD (gastroesophageal reflux disease)   . Hyperlipidemia   . Hypertension   . Memory disorder 12/23/2016  . Vitamin D deficiency     Patient Active Problem List   Diagnosis Date Noted  . TIA (transient ischemic attack) 09/30/2017  . Aortic atherosclerosis ([place]) 09/09/2017  . Memory disorder 12/23/2016  . OSA and COPD overlap syndrome ([place]) 10/13/2016  . Depression, major, recurrent, in partial remission ([place]) 10/12/2016  . HTN (hypertension) 06/05/2015  . DDD, lumbar 05/21/2014  . Chronic pain syndrome 05/21/2014  . CKD (chronic kidney  disease) stage 2, GFR 60-89 ml/min 02/11/2014  . Tobacco use disorder 02/11/2014  . Medication management 11/01/2013  . COPD (chronic obstructive pulmonary disease) ([place]) 04/27/2013  . Hyperlipidemia   . Other abnormal glucose   . Vitamin D deficiency     Past Surgical History:  Procedure Laterality Date  . ANKLE SURGERY Right   . BACK SURGERY    . BREAST EXCISIONAL BIOPSY Right   . CATARACT EXTRACTION Bilateral   . SPINE SURGERY     lumbar  . TONSILLECTOMY AND ADENOIDECTOMY       OB History   None      Home Medications    Prior to Admission medications   Medication Sig Start Date End Date Taking? Authorizing Provider  baclofen (LIORESAL) 10 MG tablet Take 10 mg 3 (three) times daily as needed by mouth for muscle spasms.   Yes [provider]  cholecalciferol 5000 units TABS Take 2 tablets daily 01/19/17  Yes Unk Pinto, MD  FLUoxetine (PROZAC) 40 MG capsule TAKE ONE CAPSULE BY MOUTH TWICE DAILY Patient taking differently: Take 40 mg by mouth daily.  09/28/17  Yes Unk Pinto, MD  ketotifen (ZADITOR) 0.025 % ophthalmic solution Place 1 drop into both eyes 3 (three) times daily as needed (allergies).   Yes [provider]  oxyCODONE (ROXICODONE) 15 MG immediate release tablet Take 15 mg by mouth every 6 (six) hours as needed for pain.  07/04/17  Yes [provider]  simvastatin (ZOCOR) 40 MG tablet take 1 tablet by mouth at bedtime Patient taking differently: Take 20 mg by mouth every evening.  09/23/16  Yes Unk Pinto, MD    Family History Family History  Problem Relation Age of Onset  . Hypertension Mother   . COPD Mother   . Stroke Mother   . Cancer Mother        breast  . Heart disease Father        Age 63, MI  . Breast cancer Neg Hx     Social History Social History   Tobacco Use  . Smoking status: Former Smoker    Packs/day: 1.00    Years: 30.00    Pack years: 30.00    Last attempt to quit: 05/10/2017    Years  since quitting: 0.3  . Smokeless tobacco: Never Used  Substance Use Topics  . Alcohol use: No  . Drug use: No     Allergies   Augmentin [amoxicillin-pot clavulanate]; Codeine; Lipitor [atorvastatin]; Wellbutrin [bupropion]; and Zoloft [sertraline hcl]   Review of Systems Review of Systems Review of Systems   Constitutional  Negative for fever  Negative for chills  HENT  Negative for ear pain  Negative for sore throat  Negative for difficultly swallowing  Eyes  Negative for eye pain  Negative for visual disturbance  Respiratory  Negative for shortness of breath  Negative for cough  CV  Negative for chest pain  Negative for leg swelling  Abdomen  Negative for abdominal pain  +for nausea  Negative for vomiting  MSK  Negative for extremity pain  Negative for back pain  Skin  Negative for rash  Negative for wound  Neuro  +for syncope (pre)  +for difficultly speaking  Psych  Negative for confusion   The remainder of the ROS was reviewed and negative except as documented above.      Physical Exam Updated Vital Signs BP 125/75 (BP Location: Right Arm)   Pulse 70   Temp 98.3 F (36.8 C) (Oral)   Resp 14   Ht 5\' 4"  (1.626 m)   Wt 71.8 kg   SpO2 96%   BMI 27.17 kg/m   Physical Exam Physical Exam Constitutional  Nursing notes reviewed  Vital signs reviewed  HEENT  No obvious trauma  Supple without meningismus, mass, or overt JVD  EOMI  No scleral icterus or injection  Respiratory  Effort normal  CTAB  No respiratory distress  CV  Normal rate  No obvious murmurs  No pitting edema  Abdomen  Soft  Non-tender  Non-distended  No peritonitis  MSK  Atraumatic  No obvious deformity  ROM appropriate  Skin  Warm  Dry  Neuro Component Findings  Mental Status Exam Alert and oriented Memory appropriate  Cranial Nerves   CN II Visual fields intact to confrontation  CN III, IV, VI PERRL EOMI No nystagmus.   CN V  Facial sensation is normal No weakness of masticatory muscles  CN VII No facial weakness or asymmetry  CN VIII Auditory acuity grossly normal  CN IX and X Uvula is midline Palate elevates symmetrically  CN XI  Normal sternocleidomastoid and trapezius strength  CN XII The tongue is midline No tongue atrophy or fasciculations  Motor   Muscle Strength RUE: 5/5 flexion and extension RLE: 5/5 flexion and extension LUE: 5/5 flexion and extension LLE: 5/5 flexion and extension  No pronation or drift  Muscle Tone Decreased  muscle bulk in RLE (chronic, due to surgery)  Coordination Intact finger-to-nose No tremor Negative Romberg  Sensation Intact to light touch  Gait Difficultly with walking (owes to chronic hip pain and ankle surgery) - reports at her baseline       Psychiatric  Mood and affect normal        ED Treatments / Results  Labs (all labs ordered are listed, but only abnormal results are displayed) Labs Reviewed  CBC WITH DIFFERENTIAL/PLATELET - Abnormal; Notable for the following components:      Result Value   Neutro Abs 7.8 (*)    All other components within normal limits  BASIC METABOLIC PANEL - Abnormal; Notable for the following components:   Creatinine, Ser 1.02 (*)    GFR calc non Af Amer 58 (*)    All other components within normal limits  TROPONIN I  HEMOGLOBIN A1C  LIPID PANEL  I-STAT CHEM 8, ED    EKG EKG Interpretation  Date/Time:  Friday September 30 2017 17:47:21 EDT Ventricular Rate:  73 PR Interval:    QRS Duration: 90 QT Interval:  439 QTC Calculation: 484 R Axis:   91 Text Interpretation:  Sinus rhythm Right axis deviation Nonspecific T abnrm, anterolateral leads Abnormal ekg Confirmed by Carmin Muskrat [phone]) on 09/30/2017 5:58:28 PM   Radiology Ct Angio Head W Or Wo Contrast  Result Date: 09/30/2017 CLINICAL DATA:  Slurred speech, left arm weakness and near syncope. EXAM: CT ANGIOGRAPHY HEAD AND NECK TECHNIQUE: Multidetector CT  imaging of the head and neck was performed using the standard protocol during bolus administration of intravenous contrast. Multiplanar CT image reconstructions and MIPs were obtained to evaluate the vascular anatomy. Carotid stenosis measurements (when applicable) are obtained utilizing NASCET criteria, using the distal internal carotid diameter as the denominator. CONTRAST:  60mL ISOVUE-370 IOPAMIDOL (ISOVUE-370) INJECTION 76% COMPARISON:  None. FINDINGS: CT HEAD FINDINGS Brain: There is no mass, hemorrhage or extra-axial collection. The size and configuration of the ventricles and extra-axial CSF spaces are normal. There is no acute or chronic infarction. The brain parenchyma is normal. Skull: The visualized skull base, calvarium and extracranial soft tissues are normal. Sinuses/Orbits: No fluid levels or advanced mucosal thickening of the visualized paranasal sinuses. No mastoid or middle ear effusion. The orbits are normal. CTA NECK FINDINGS AORTIC ARCH: There is no calcific atherosclerosis of the aortic arch. There is no aneurysm, dissection or hemodynamically significant stenosis of the visualized ascending aorta and aortic arch. Conventional 3 vessel aortic branching pattern. The visualized proximal subclavian arteries are widely patent. RIGHT CAROTID SYSTEM: --Common carotid artery: Widely patent origin without common carotid artery dissection or aneurysm. --Internal carotid artery: No dissection, occlusion or aneurysm. Mild atherosclerotic calcification at the carotid bifurcation without hemodynamically significant stenosis. --External carotid artery: No acute abnormality. LEFT CAROTID SYSTEM: --Common carotid artery: Widely patent origin without common carotid artery dissection or aneurysm. --Internal carotid artery:No dissection, occlusion or aneurysm. Mild atherosclerotic calcification at the carotid bifurcation without hemodynamically significant stenosis. --External carotid artery: No acute  abnormality. VERTEBRAL ARTERIES: Left dominant configuration. Both origins are normal. No dissection, occlusion or flow-limiting stenosis to the vertebrobasilar confluence. SKELETON: There is no bony spinal canal stenosis. No lytic or blastic lesion. OTHER NECK: Normal pharynx, larynx and major salivary glands. No cervical lymphadenopathy. Unremarkable thyroid gland. UPPER CHEST: Biapical emphysema. CTA HEAD FINDINGS ANTERIOR CIRCULATION: --Intracranial internal carotid arteries: Normal. --Anterior cerebral arteries: Normal. Both A1 segments are present. Patent anterior communicating artery. --Middle cerebral arteries: Normal. --Posterior communicating  arteries: Absent bilaterally. POSTERIOR CIRCULATION: --Basilar artery: Normal. --Posterior cerebral arteries: Normal. --Superior cerebellar arteries: Normal. --Inferior cerebellar arteries: Normal anterior and posterior inferior cerebellar arteries. VENOUS SINUSES: As permitted by contrast timing, patent. ANATOMIC VARIANTS: None DELAYED PHASE: No parenchymal contrast enhancement. Review of the MIP images confirms the above findings. IMPRESSION: 1. Normal head CT. 2. No emergent large vessel occlusion or hemodynamically significant stenosis. 3. Mild bilateral carotid bifurcation atherosclerotic calcification without stenosis. 4. 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 Electronically Signed   By: Ulyses Jarred M.D.   On: 09/30/2017 20:36   Dg Chest 2 View  Result Date: 09/30/2017 CLINICAL DATA:  Syncope episodes today while out in the sun. Family reports slurred speech. EXAM: CHEST - 2 VIEW COMPARISON:  None. FINDINGS: Normal mediastinum and cardiac silhouette. Normal pulmonary vasculature. No evidence of effusion, infiltrate, or pneumothorax. No acute bony abnormality. IMPRESSION: No acute cardiopulmonary process. Electronically Signed   By: Suzy Bouchard M.D.   On: 09/30/2017 16:31   Ct Angio Neck W And/or Wo Contrast  Result Date: 09/30/2017 CLINICAL DATA:   Slurred speech, left arm weakness and near syncope. EXAM: CT ANGIOGRAPHY HEAD AND NECK TECHNIQUE: Multidetector CT imaging of the head and neck was performed using the standard protocol during bolus administration of intravenous contrast. Multiplanar CT image reconstructions and MIPs were obtained to evaluate the vascular anatomy. Carotid stenosis measurements (when applicable) are obtained utilizing NASCET criteria, using the distal internal carotid diameter as the denominator. CONTRAST:  76mL ISOVUE-370 IOPAMIDOL (ISOVUE-370) INJECTION 76% COMPARISON:  None. FINDINGS: CT HEAD FINDINGS Brain: There is no mass, hemorrhage or extra-axial collection. The size and configuration of the ventricles and extra-axial CSF spaces are normal. There is no acute or chronic infarction. The brain parenchyma is normal. Skull: The visualized skull base, calvarium and extracranial soft tissues are normal. Sinuses/Orbits: No fluid levels or advanced mucosal thickening of the visualized paranasal sinuses. No mastoid or middle ear effusion. The orbits are normal. CTA NECK FINDINGS AORTIC ARCH: There is no calcific atherosclerosis of the aortic arch. There is no aneurysm, dissection or hemodynamically significant stenosis of the visualized ascending aorta and aortic arch. Conventional 3 vessel aortic branching pattern. The visualized proximal subclavian arteries are widely patent. RIGHT CAROTID SYSTEM: --Common carotid artery: Widely patent origin without common carotid artery dissection or aneurysm. --Internal carotid artery: No dissection, occlusion or aneurysm. Mild atherosclerotic calcification at the carotid bifurcation without hemodynamically significant stenosis. --External carotid artery: No acute abnormality. LEFT CAROTID SYSTEM: --Common carotid artery: Widely patent origin without common carotid artery dissection or aneurysm. --Internal carotid artery:No dissection, occlusion or aneurysm. Mild atherosclerotic calcification at  the carotid bifurcation without hemodynamically significant stenosis. --External carotid artery: No acute abnormality. VERTEBRAL ARTERIES: Left dominant configuration. Both origins are normal. No dissection, occlusion or flow-limiting stenosis to the vertebrobasilar confluence. SKELETON: There is no bony spinal canal stenosis. No lytic or blastic lesion. OTHER NECK: Normal pharynx, larynx and major salivary glands. No cervical lymphadenopathy. Unremarkable thyroid gland. UPPER CHEST: Biapical emphysema. CTA HEAD FINDINGS ANTERIOR CIRCULATION: --Intracranial internal carotid arteries: Normal. --Anterior cerebral arteries: Normal. Both A1 segments are present. Patent anterior communicating artery. --Middle cerebral arteries: Normal. --Posterior communicating arteries: Absent bilaterally. POSTERIOR CIRCULATION: --Basilar artery: Normal. --Posterior cerebral arteries: Normal. --Superior cerebellar arteries: Normal. --Inferior cerebellar arteries: Normal anterior and posterior inferior cerebellar arteries. VENOUS SINUSES: As permitted by contrast timing, patent. ANATOMIC VARIANTS: None DELAYED PHASE: No parenchymal contrast enhancement. Review of the MIP images confirms the above findings. IMPRESSION: 1. Normal head CT. 2. No  emergent large vessel occlusion or hemodynamically significant stenosis. 3. Mild bilateral carotid bifurcation atherosclerotic calcification without stenosis. 4. 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 Electronically Signed   By: Ulyses Jarred M.D.   On: 09/30/2017 20:36   Mr Brain Wo Contrast  Result Date: 09/30/2017 CLINICAL DATA:  Stroke follow-up EXAM: MRI HEAD WITHOUT CONTRAST TECHNIQUE: Multiplanar, multiecho pulse sequences of the brain and surrounding structures were obtained without intravenous contrast. COMPARISON:  Head CT 09/30/2017 Brain MRI 01/02/2017 FINDINGS: BRAIN: There is no acute infarct, acute hemorrhage or mass effect. The midline structures are normal. There are no old infarcts.  Multifocal white matter hyperintensity, most commonly due to chronic ischemic microangiopathy. The CSF spaces are normal for age, with no hydrocephalus. Susceptibility-sensitive sequences show no chronic microhemorrhage or superficial siderosis. VASCULAR: Major intracranial arterial and venous sinus flow voids are preserved. SKULL AND UPPER CERVICAL SPINE: The visualized skull base, calvarium, upper cervical spine and extracranial soft tissues are normal. SINUSES/ORBITS: No fluid levels or advanced mucosal thickening. No mastoid or middle ear effusion. The orbits are normal. IMPRESSION: Chronic ischemic microangiopathy without acute intracranial abnormality. Electronically Signed   By: Ulyses Jarred M.D.   On: 09/30/2017 21:26    Procedures Procedures (including critical care time)  Medications Ordered in ED Medications  iopamidol (ISOVUE-370) 76 % injection (has no administration in time range)   stroke: mapping our early stages of recovery book ( Does not apply Not Given 10/01/17 0030)  aspirin suppository 300 mg ( Rectal See Alternative 09/30/17 1923)    Or  aspirin tablet 325 mg (325 mg Oral Given 09/30/17 1923)  oxyCODONE (Oxy IR/ROXICODONE) immediate release tablet 15 mg (15 mg Oral Given 10/01/17 0031)  simvastatin (ZOCOR) tablet 20 mg (has no administration in time range)  FLUoxetine (PROZAC) capsule 40 mg (has no administration in time range)  baclofen (LIORESAL) tablet 10 mg (10 mg Oral Given 10/01/17 0031)  ketotifen (ZADITOR) 0.025 % ophthalmic solution 1 drop (has no administration in time range)  acetaminophen (TYLENOL) tablet 650 mg (has no administration in time range)    Or  acetaminophen (TYLENOL) solution 650 mg (has no administration in time range)    Or  acetaminophen (TYLENOL) suppository 650 mg (has no administration in time range)  enoxaparin (LOVENOX) injection 40 mg (has no administration in time range)  senna-docusate (Senokot-S) tablet 1 tablet (has no administration in  time range)  lactated ringers bolus 1,000 mL (0 mLs Intravenous Stopped 09/30/17 1830)  iopamidol (ISOVUE-370) 76 % injection 50 mL (50 mLs Intravenous Contrast Given 09/30/17 1938)     Initial Impression / Assessment and Plan / ED Course  I have reviewed the triage vital signs and the nursing notes.  Pertinent labs & imaging results that were available during my care of the patient were reviewed by me and considered in my medical decision making (see chart for details).     Zykeria Laguardia Rowand presents due to slurred speech and feeling off balance as per above.  She is hemodynamically stable.  Her neurological exam is normal.  She can ambulate at her baseline.  I am primarily concerned for a TIA.  Although heat exertion/heatstroke are on the differential, she is afebrile and hydrated appearing.  She had no acute abnormalities on CBC or BMP.  Her troponin is not elevated.  A bruit was appreciated in the right carotid.  CT head and neck with angiography was obtained.  No acute abnormalities.  Neurology was consulted.  They obtained an MRI, which revealed no acute  abnormalities.  They recommended admission to the hospitalist service for further TIA work-up.  She was admitted to the hospitalist service.  Final Clinical Impressions(s) / ED Diagnoses   Final diagnoses:  TIA (transient ischemic attack)    ED Discharge Orders    None       Alford Highland, MD 10/01/17 [id number]    Carmin Muskrat, MD 10/02/17 2029

## 2017-10-01 ENCOUNTER — Observation Stay (HOSPITAL_BASED_OUTPATIENT_CLINIC_OR_DEPARTMENT_OTHER): Payer: Medicare Other

## 2017-10-01 ENCOUNTER — Other Ambulatory Visit: Payer: Self-pay

## 2017-10-01 DIAGNOSIS — G459 Transient cerebral ischemic attack, unspecified: Secondary | ICD-10-CM

## 2017-10-01 LAB — LIPID PANEL
CHOL/HDL RATIO: 2.8 ratio
Cholesterol: 155 mg/dL (ref 0–200)
HDL: 55 mg/dL (ref >40)
LDL CALC: 79 mg/dL (ref 0–99)
Triglycerides: 103 mg/dL (ref <150)
VLDL: 21 mg/dL (ref 0–40)

## 2017-10-01 LAB — HEMOGLOBIN A1C
Hgb A1c MFr Bld: 5.8 % — ABNORMAL HIGH (ref 4.8–5.6)
Mean Plasma Glucose: 119.76 mg/dL

## 2017-10-01 LAB — ECHOCARDIOGRAM COMPLETE
Height: 64 in
Weight: 2532.64 oz

## 2017-10-01 NOTE — Progress Notes (Signed)
Pt being discharged from hospital per orders from MD. Pt educated on discharge instructions. Pt verbalized understanding of instructions. All questions and concerns were addressed. Pt's IV was removed prior to discharge. Pt exited hospital via wheelchair accompanied by staff. 

## 2017-10-01 NOTE — Progress Notes (Addendum)
STROKE TEAM PROGRESS NOTE   HISTORY OF PRESENT ILLNESS (per record) Amber Hickman is a 62 y.o. female past medical history of multiple back and orthopedic surgeries, hypertension, hyperlipidemia, avascular necrosis of multiple joints, presented to the emergency room for evaluation of above-mentioned symptoms of left-sided weakness, slurred speech and near syncope. She said that her last known normal was around 11 AM, when she was working in her yard and felt pretty hot and dehydrated.  She came in inside the house, took a nap, woke up but still felt very rundown.  When she woke up again, she found it very hard to get up.  She almost had a fall without losing consciousness when she went down her knees in the kitchen.  When she attempted to speak to her dogs, her speech was slurred.  She also felt dizzy as in lightheaded.  She also noticed that her left arm was tingly and numb as well as weak. The symptoms lasted for about 20-30 minutes.  She came to the ER upon guidance from a friend who is a Marine scientist.  Her neurological examination on arrival was completely normal. Neurological consultation was obtained for concern for TIA. She has not had strokes in the past. There is family history of heart disease and death of her father at the age of 7 from heart disease.  No history of strokes in the family. Multiple family members have cancers. She quit smoking in March this year. Does not use illicit drug or alcohol.  LKW: 11 AM 09/30/2017 tpa given?: no, symptoms resolved, also neurology called after patient was outside the window. Premorbid modified Rankin scale (mRS): 0   SUBJECTIVE (INTERVAL HISTORY) Patient is doing well, back to her baseline and no complaints     OBJECTIVE Vitals:   09/30/17 2330 10/01/17 0130 10/01/17 0330 10/01/17 0530  BP: 125/75 122/64 112/64 118/73  Pulse: 70  71 70  Resp: 14 15 15 16   Temp: 98.3 F (36.8 C) 98.3 F (36.8 C) 98.4 F (36.9 C) 98.3 F (36.8 C)   TempSrc: Oral Oral Oral Oral  SpO2: 96%     Weight: 71.8 kg     Height: 5\' 4"  (1.626 m)       CBC:  Recent Labs  Lab 09/30/17 1617 09/30/17 1730  WBC 10.1  --   NEUTROABS 7.8*  --   HGB 12.5 13.3  HCT 39.8 39.0  MCV 91.5  --   PLT 272  --     Basic Metabolic Panel:  Recent Labs  Lab 09/30/17 1617 09/30/17 1730  NA 142 140  K 4.1 4.1  CL 105 102  CO2 26  --   GLUCOSE 96 95  BUN 12 17  CREATININE 1.02* 0.90  CALCIUM 9.5  --     Lipid Panel:     Component Value Date/Time   CHOL 155 10/01/2017 0446   TRIG 103 10/01/2017 0446   HDL 55 10/01/2017 0446   CHOLHDL 2.8 10/01/2017 0446   VLDL 21 10/01/2017 0446   LDLCALC 79 10/01/2017 0446   LDLCALC 101 (H) 07/26/2017 1537   HgbA1c:  Lab Results  Component Value Date   HGBA1C 5.8 (H) 10/01/2017   Urine Drug Screen: No results found for: LABOPIA, COCAINSCRNUR, LABBENZ, AMPHETMU, THCU, LABBARB  Alcohol Level No results found for: ETH  IMAGING   Ct Angio Head W Or Wo Contrast Ct Angio Neck W And/or Wo Contrast 09/30/2017 IMPRESSION:  1. Normal head CT.  2. No emergent large  vessel occlusion or hemodynamically significant stenosis.  3. Mild bilateral carotid bifurcation atherosclerotic calcification without stenosis.  4. 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    Dg Chest 2 View 09/30/2017 IMPRESSION:  No acute cardiopulmonary process.     Mr Brain Wo Contrast 09/30/2017 IMPRESSION:  Chronic ischemic microangiopathy without acute intracranial abnormality.     Transthoracic Echocardiogram - pending 00/00/00        PHYSICAL EXAM Blood pressure 118/73, pulse 70, temperature 98.3 F (36.8 C), temperature source Oral, resp. rate 16, height 5\' 4"  (1.626 m), weight 71.8 kg, SpO2 96 %.        ASSESSMENT/PLAN Ms. Amber Hickman is a 62 y.o. female with history of Htn, HLD, DM, COPD, depression, and memory problems presenting with . She did not receive IV t-PA due to deficits resolved..    Probable TIA:    Resultant - returned to baseline  CTA H&N - normal  MRI head - Chronic ischemic microangiopathy without acute intracranial abnormality.  MRA head - not performed  Carotid Doppler - CTA neck performed or pending - carotid dopplers not indicated.  2D Echo - pending  LDL - 79  HgbA1c - 5.8  VTE prophylaxis - Lovenox  Diet - Heart healthy with thin liquids.  No antithrombotic prior to admission, now on aspirin 325 mg daily  Patient counseled to be compliant with her antithrombotic medications  Ongoing aggressive stroke risk factor management  Therapy recommendations:  No OT f/u  Disposition:  Pending  Hypertension  Stable but mildly low at times . Permissive hypertension (OK if < 220/120) but gradually normalize in 5-7 days . Long-term BP goal normotensive  Hyperlipidemia  Lipid lowering medication PTA:  Zocor 20 mg daily  LDL 79, goal < 70  Current lipid lowering medication: Zocor 20 mg daily  Continue statin at discharge  Diabetes  HgbA1c 5.8, goal < 7.0  Controlled  Other Stroke Risk Factors  Advanced age  Former cigarette smoker - quit  Overweight, Body mass index is 27.17 kg/m., recommend weight loss, diet and exercise as appropriate   Family Hx (mother)  Other Active Problems    62 yo female who presented with some L arm weakness and slurred speech which resolved in 20-30 minutes  Stroke workup negative  Echo is the only thing pending. If negative, no further neurological workup needed  Most likely TIA  - Continue ASA and lipitor - Outpatient neurology follow up  Addendum: - Echo- - Normal LV EF, no signficant valve abnormalities  No further neurological workup needed   Hospital day # 0    To contact Stroke Continuity provider, please refer to [email or web address] After hours, contact General Neurology

## 2017-10-01 NOTE — Evaluation (Signed)
Physical Therapy Evaluation Patient Details Name: Amber Hickman MRN: [id number] DOB: 1955/03/16 Today's Date: 10/01/2017   History of Present Illness  Amber Hickman is a 62 y.o. female with medical history significant for chronic low back pain with radiculopathy and chronic right leg weakness, COPD, depression, and mild renal insufficiency, now presenting to the emergency department after an episode involving near syncope, dysarthria, and left arm weakness.  Patient had been working out in her yard in the heat today, had nausea with one episode of vomiting. MRI neg for acute changes.  Clinical Impression  Pt admitted with above diagnosis. Pt currently with functional limitations due to the deficits listed below (see PT Problem List). Pt's symptoms have resolved but pt with baseline back pain and RLE weakness that affects balance and posture. Pt given SPC on R side which did help posture with ambulation. Pt with minor LOB with self correction. Recommend outpt PT after discharge for balance training.  Pt will benefit from skilled PT to increase their independence and safety with mobility to allow discharge to the venue listed below.       Follow Up Recommendations Outpatient PT;Supervision - Intermittent    Equipment Recommendations  Cane    Recommendations for Other Services       Precautions / Restrictions Precautions Precautions: Fall Restrictions Weight Bearing Restrictions: No      Mobility  Bed Mobility Overal bed mobility: Needs Assistance Bed Mobility: Rolling;Sidelying to Sit Rolling: Supervision Sidelying to sit: Supervision       General bed mobility comments: pt reports that her back is hurting, then trying to sit straight up in bed, vc's for rolling and sitting up which pt was able to do independently  Transfers Overall transfer level: Needs assistance Equipment used: None Transfers: Sit to/from Stand Sit to Stand: Min guard          General transfer comment: min-guard for safety, no buckling of knees, pt with R trunk lean  Ambulation/Gait Ambulation/Gait assistance: Min assist Gait Distance (Feet): 300 Feet Assistive device: None;Straight cane Gait Pattern/deviations: Step-through pattern;Staggering right Gait velocity: decreased Gait velocity interpretation: 1.31 - 2.62 ft/sec, indicative of limited community ambulator General Gait Details: pt ambulates with right truncal lean, occasional stagger to correct for LOB, no overt LOB that she needed assist to correct. After halfway, tried [facility] in R hand which did help her posture. She needs more practice with this though for fluidity.    Stairs Stairs: Yes Stairs assistance: Min assist Stair Management: One rail Right;Step to pattern;Forwards Number of Stairs: 4 General stair comments: pt with difficulty with stairs due to generalized LE weakness and more pronounced RLE weakness.   Wheelchair Mobility    Modified Rankin (Stroke Patients Only) Modified Rankin (Stroke Patients Only) Pre-Morbid Rankin Score: Slight disability Modified Rankin: Moderate disability     Balance Overall balance assessment: Needs assistance Sitting-balance support: No upper extremity supported Sitting balance-Leahy Scale: Good     Standing balance support: No upper extremity supported Standing balance-Leahy Scale: Good Standing balance comment: limitation evident in increased reaction time to challenges                             Pertinent Vitals/Pain Pain Assessment: Faces Faces Pain Scale: Hurts little more Pain Location: back Pain Descriptors / Indicators: Aching Pain Intervention(s): Limited activity within patient's tolerance;Monitored during session    Home Living Family/patient expects to be discharged to:: Private residence Living Arrangements: Alone  Available Help at Discharge: Family;Available PRN/intermittently Type of Home: House Home Access: Stairs  to enter   Entrance Stairs-Number of Steps: 1 Home Layout: One level Home Equipment: Walker - 2 wheels;Bedside commode;Tub bench;Shower seat;Adaptive equipment      Prior Function Level of Independence: Independent         Comments: pt lives with her 3 dogs. She had back surgery in April and has had recurrence of pain since she did some fwd bending activity and is awaiting f/u MRI. She's had RLE weakness since surgery and has had some falls     Hand Dominance   Dominant Hand: Right    Extremity/Trunk Assessment   Upper Extremity Assessment Upper Extremity Assessment: Defer to OT evaluation    Lower Extremity Assessment Lower Extremity Assessment: Generalized weakness;RLE deficits/detail RLE Deficits / Details: knee ext 3+/5, knee flex 3+/5, hip flex 3/5 RLE Sensation: decreased light touch RLE Coordination: decreased fine motor;decreased gross motor    Cervical / Trunk Assessment Cervical / Trunk Assessment: Other exceptions Cervical / Trunk Exceptions: h/o lumbar surgery, s cureve noted as well  Communication   Communication: No difficulties  Cognition Arousal/Alertness: Awake/alert Behavior During Therapy: WFL for tasks assessed/performed Overall Cognitive Status: Within Functional Limits for tasks assessed                                        General Comments      Exercises     Assessment/Plan    PT Assessment Patient needs continued PT services  PT Problem List Decreased strength;Decreased activity tolerance;Decreased balance;Decreased mobility;Decreased knowledge of use of DME;Decreased knowledge of precautions;Pain       PT Treatment Interventions DME instruction;Gait training;Stair training;Functional mobility training;Therapeutic activities;Therapeutic exercise;Balance training;Patient/family education    PT Goals (Current goals can be found in the Care Plan section)  Acute Rehab PT Goals Patient Stated Goal: return home PT Goal  Formulation: With patient Time For Goal Achievement: 10/15/17 Potential to Achieve Goals: Good    Frequency Min 3X/week   Barriers to discharge Decreased caregiver support lives alone    Co-evaluation               AM-PAC PT "6 Clicks" Daily Activity  Outcome Measure Difficulty turning over in bed (including adjusting bedclothes, sheets and blankets)?: A Little Difficulty moving from lying on back to sitting on the side of the bed? : A Little Difficulty sitting down on and standing up from a chair with arms (e.g., wheelchair, bedside commode, etc,.)?: A Little Help needed moving to and from a bed to chair (including a wheelchair)?: A Little Help needed walking in hospital room?: A Little Help needed climbing 3-5 steps with a railing? : A Little 6 Click Score: 18    End of Session Equipment Utilized During Treatment: Gait belt Activity Tolerance: Patient tolerated treatment well Patient left: in chair;with call bell/phone within reach Nurse Communication: Mobility status PT Visit Diagnosis: Unsteadiness on feet (R26.81);Muscle weakness (generalized) (M62.81);Pain;Difficulty in walking, not elsewhere classified (R26.2) Pain - part of body: (back)    Time: 2706-2376 PT Time Calculation (min) (ACUTE ONLY): 37 min   Charges:   PT Evaluation $PT Eval Moderate Complexity: 1 Mod PT Treatments $Gait Training: 8-22 mins        [place] 10/01/2017, 11:42 AM

## 2017-10-01 NOTE — Care Management (Signed)
Referral to Outpatient PT placed and information placed on AVS.

## 2017-10-01 NOTE — Evaluation (Signed)
Occupational Therapy Evaluation and Discharge from Acute OT Patient Details Name: Amber Hickman MRN: [id number] DOB: 03-Feb-1956 Today's Date: 10/01/2017    History of Present Illness Amber Hickman is a 62 y.o. female with medical history significant for chronic low back pain with radiculopathy and chronic right leg weakness, COPD, depression, and mild renal insufficiency, now presenting to the emergency department after an episode involving near syncope, dysarthria, and left arm weakness.  Patient had been working out in her yard in the heat today, had nausea with one episode of vomiting. MRI neg for acute changes.   Clinical Impression   PTA Pt mod I with DME (shower seat, AE). Pt is currently at baseline for ADL. Please see ADL section for full write up. Pt's biggest concern was tub/shower transfer - which she was able to perform in the neuro gym. Educated for compensatory strategies as well as safety/use of DME. Education complete. No further questions or concerns for OT at this time. OT to sign off thank you for the opportunity to serve this patient.     Follow Up Recommendations  No OT follow up    Equipment Recommendations  None recommended by OT(Pt has appropriate DME)    Recommendations for Other Services       Precautions / Restrictions Precautions Precautions: Fall Restrictions Weight Bearing Restrictions: No      Mobility Bed Mobility Overal bed mobility: Needs Assistance Bed Mobility: Rolling;Sidelying to Sit Rolling: Supervision Sidelying to sit: Supervision       General bed mobility comments: Recieved in recliner, and returned to recliner  Transfers Overall transfer level: Needs assistance Equipment used: None Transfers: Sit to/from Stand Sit to Stand: Min guard         General transfer comment: min-guard for safety, no buckling of knees, pt with R trunk lean    Balance Overall balance assessment: Needs assistance Sitting-balance  support: No upper extremity supported Sitting balance-Leahy Scale: Good     Standing balance support: No upper extremity supported Standing balance-Leahy Scale: Good Standing balance comment: limitation evident in increased reaction time to challenges                           ADL either performed or assessed with clinical judgement   ADL Overall ADL's : At baseline                                       General ADL Comments: Pt able to perform LB dressing (socks), sink level grooming (oral care, hand and face washing) toilet transfer, peri care, and tub transfer in the neuro gym. We discussed shower safety at length and she explained that she uses fixed surfaces for stability and has both a shower chair and a tub bench that advanced home care can help her with if needed. She was min guard today but did not need physical assist for the transfer. She says she feels about "normal" getting in and out of the tub.     Vision Patient Visual Report: No change from baseline       Perception     Praxis      Pertinent Vitals/Pain Pain Assessment: Faces Faces Pain Scale: Hurts little more Pain Location: back Pain Descriptors / Indicators: Aching Pain Intervention(s): Limited activity within patient's tolerance;Monitored during session;Repositioned     Hand Dominance Right   Extremity/Trunk  Assessment Upper Extremity Assessment Upper Extremity Assessment: Overall WFL for tasks assessed   Lower Extremity Assessment Lower Extremity Assessment: Defer to PT evaluation RLE Deficits / Details: knee ext 3+/5, knee flex 3+/5, hip flex 3/5 RLE Sensation: decreased light touch RLE Coordination: decreased fine motor;decreased gross motor   Cervical / Trunk Assessment Cervical / Trunk Assessment: Other exceptions Cervical / Trunk Exceptions: h/o lumbar surgery, s cureve noted as well   Communication Communication Communication: No difficulties   Cognition  Arousal/Alertness: Awake/alert Behavior During Therapy: WFL for tasks assessed/performed Overall Cognitive Status: Within Functional Limits for tasks assessed                                     General Comments       Exercises     Shoulder Instructions      Home Living Family/patient expects to be discharged to:: Private residence Living Arrangements: Alone Available Help at Discharge: Family;Available PRN/intermittently Type of Home: House Home Access: Stairs to enter [employer] of Steps: 1   Home Layout: One level     Bathroom Shower/Tub: Teacher, early years/pre: Standard     Home Equipment: Environmental consultant - 2 wheels;Bedside commode;Tub bench;Shower seat;Adaptive equipment Adaptive Equipment: Reacher        Prior Functioning/Environment Level of Independence: Independent        Comments: pt lives with her 3 dogs. She had back surgery in April and has had recurrence of pain since she did some fwd bending activity and is awaiting f/u MRI. She's had RLE weakness since surgery and has had some falls        OT Problem List:        OT Treatment/Interventions:      OT Goals(Current goals can be found in the care plan section) Acute Rehab OT Goals Patient Stated Goal: return home to dogs OT Goal Formulation: With patient Time For Goal Achievement: 10/15/17 Potential to Achieve Goals: Good  OT Frequency:     Barriers to D/C:            Co-evaluation              AM-PAC PT "6 Clicks" Daily Activity     Outcome Measure Help from another person eating meals?: None Help from another person taking care of personal grooming?: None Help from another person toileting, which includes using toliet, bedpan, or urinal?: None Help from another person bathing (including washing, rinsing, drying)?: A Little Help from another person to put on and taking off regular upper body clothing?: None Help from another person to put on and  taking off regular lower body clothing?: None 6 Click Score: 23   End of Session Equipment Utilized During Treatment: Gait belt;Other (comment)(SPC) Nurse Communication: Mobility status  Activity Tolerance: Patient tolerated treatment well Patient left: in chair;with call bell/phone within reach                   Time: 4656-8127 OT Time Calculation (min): 21 min Charges:  OT General Charges $OT Visit: 1 Visit OT Evaluation $OT Eval Moderate Complexity: [place] OTR/L [place] 10/01/2017, 12:12 PM

## 2017-10-01 NOTE — Progress Notes (Signed)
  Echocardiogram 2D Echocardiogram has been performed.  Johny Chess 10/01/2017, 2:53 PM

## 2017-10-01 NOTE — Progress Notes (Addendum)
Patient arrived on floor around 2230 alert and oriented with no neurological deficits VSS heart monitor connected, patient was given a litre of fluid in ED, aklamated to the room will continue q2 VS and neuro check until 0530.

## 2017-10-02 NOTE — Discharge Summary (Signed)
Physician Discharge Summary  Belladonna Lubinski Macomber UMP:[id number] DOB: Jun 08, 1955 DOA: 09/30/2017  PCP: Unk Pinto, MD  Admit date: 09/30/2017 Discharge date: 10/02/2017  Time spent: 35 minutes  Recommendations for Outpatient Follow-up:  1. D/c diagnosis was heat stroke -NOT CVA 2. Needs bmet and cbc 1 weeka nd pcp follow up 1 week   Discharge Diagnoses:  Principal Problem:   TIA (transient ischemic attack) Active Problems:   COPD (chronic obstructive pulmonary disease) (HCC)   Chronic pain syndrome   Discharge Condition: good  Diet recommendation: reg  Filed Weights   09/30/17 1530 09/30/17 2330  Weight: 68 kg 71.8 kg    History of present illness:  62 year old female multiple orthopedic procedures lumbar degenerative disc disease status post surgery 2004 Chronic pain followed by Dr. Neta Mends Topamax gabapentin as well Memory loss followed by Dr. Jannifer Franklin of neurology OSA not on CPAP HTN, Dental caries Prediabetes with A1c 5.8 HLD long-term smoker COPD stage B   Admit 8/16 with presyncope some splurged speech in a setting of volume depletion and feeling hot as was outside-symptoms lasted about 20 minutes left arm was tingly and numb   Neurology was consulted  CT angiogram neck showed no overt abnormality and MRI brain showed chronic ischemic microangiopathy without an intracranial abnormality   No metabolic component on admission other than slightly elevated creatinine of 1.0   She had a work-up per cva pathway and was felt to need maybe asa and lipitor on d/c She was d/c home in a stable state after being cleared by neurology  Discharge Exam: Vitals:   10/01/17 0837 10/01/17 1225  BP:  111/65  Pulse:  70  Resp:  18  Temp: 98.4 F (36.9 C) 98.5 F (36.9 C)  SpO2:  (!) 88%    General: awake pleasant in noad Cardiovascular: eomi ncat no droop smile symm Respiratory: s1 s 2no m/r/g Power 5/5 Ambulatory No overt weak Reflexes 2/3  Discharge  Instructions   Discharge Instructions    Ambulatory referral to Physical Therapy   Complete by:  As directed    Diet - low sodium heart healthy   Complete by:  As directed    Discharge instructions   Complete by:  As directed    You probably had medication and heat induced weakness It is unlikely you had a stroke-follow up with your primary md in 1 week and there have been no changes to your medications   Increase activity slowly   Complete by:  As directed      Allergies as of 10/01/2017      Reactions   Augmentin [amoxicillin-pot Clavulanate] Nausea And Vomiting   Codeine Nausea And Vomiting   Lipitor [atorvastatin]    Leg pain   Wellbutrin [bupropion]    Pt does not recall reaction    Zoloft [sertraline Hcl]    Pt does not recall reaction       Medication List    TAKE these medications   baclofen 10 MG tablet Commonly known as:  LIORESAL Take 10 mg 3 (three) times daily as needed by mouth for muscle spasms.   Cholecalciferol 5000 units Tabs Take 2 tablets daily   FLUoxetine 40 MG capsule Commonly known as:  PROZAC TAKE ONE CAPSULE BY MOUTH TWICE DAILY What changed:    how much to take  how to take this  when to take this  additional instructions   ketotifen 0.025 % ophthalmic solution Commonly known as:  ZADITOR Place 1 drop into both eyes  3 (three) times daily as needed (allergies).   oxyCODONE 15 MG immediate release tablet Commonly known as:  ROXICODONE Take 15 mg by mouth every 6 (six) hours as needed for pain.   simvastatin 40 MG tablet Commonly known as:  ZOCOR take 1 tablet by mouth at bedtime What changed:    how much to take  when to take this      Allergies  Allergen Reactions  . Augmentin [Amoxicillin-Pot Clavulanate] Nausea And Vomiting  . Codeine Nausea And Vomiting  . Lipitor [Atorvastatin]     Leg pain  . Wellbutrin [Bupropion]     Pt does not recall reaction   . Zoloft [Sertraline Hcl]     Pt does not recall reaction     Follow-up Information    [facility].   Specialty:  Rehabilitation Why:  if you have not heard from them in a couple of days by Wed, please contact them to schedule your apt.  Contact information: [address] [place] [phone] [place] [facility]   Specialty:  Rehabilitation Why:  Please call if you haven't heard from someone by Tuesday or Wednesday.  You can choose this location if it is closer for you. Contact information: [place] [phone] [place] [phone]           The results of significant diagnostics from this hospitalization (including imaging, microbiology, ancillary and laboratory) are listed below for reference.    Significant Diagnostic Studies: Ct Angio Head W Or Wo Contrast  Result Date: 09/30/2017 CLINICAL DATA:  Slurred speech, left arm weakness and near syncope. EXAM: CT ANGIOGRAPHY HEAD AND NECK TECHNIQUE: Multidetector CT imaging of the head and neck was performed using the standard protocol during bolus administration of intravenous contrast. Multiplanar CT image reconstructions and MIPs were obtained to evaluate the vascular anatomy. Carotid stenosis measurements (when applicable) are obtained utilizing NASCET criteria, using the distal internal carotid diameter as the denominator. CONTRAST:  72mL ISOVUE-370 IOPAMIDOL (ISOVUE-370) INJECTION 76% COMPARISON:  None. FINDINGS: CT HEAD FINDINGS Brain: There is no mass, hemorrhage or extra-axial collection. The size and configuration of the ventricles and extra-axial CSF spaces are normal. There is no acute or chronic infarction. The brain parenchyma is normal. Skull: The visualized skull base, calvarium and extracranial soft tissues are normal. Sinuses/Orbits: No fluid levels or advanced mucosal thickening of the  visualized paranasal sinuses. No mastoid or middle ear effusion. The orbits are normal. CTA NECK FINDINGS AORTIC ARCH: There is no calcific atherosclerosis of the aortic arch. There is no aneurysm, dissection or hemodynamically significant stenosis of the visualized ascending aorta and aortic arch. Conventional 3 vessel aortic branching pattern. The visualized proximal subclavian arteries are widely patent. RIGHT CAROTID SYSTEM: --Common carotid artery: Widely patent origin without common carotid artery dissection or aneurysm. --Internal carotid artery: No dissection, occlusion or aneurysm. Mild atherosclerotic calcification at the carotid bifurcation without hemodynamically significant stenosis. --External carotid artery: No acute abnormality. LEFT CAROTID SYSTEM: --Common carotid artery: Widely patent origin without common carotid artery dissection or aneurysm. --Internal carotid artery:No dissection, occlusion or aneurysm. Mild atherosclerotic calcification at the carotid bifurcation without hemodynamically significant stenosis. --External carotid artery: No acute abnormality. VERTEBRAL ARTERIES: Left dominant configuration. Both origins are normal. No dissection, occlusion or flow-limiting stenosis to the vertebrobasilar confluence. SKELETON: There is no bony spinal canal stenosis. No lytic or blastic  lesion. OTHER NECK: Normal pharynx, larynx and major salivary glands. No cervical lymphadenopathy. Unremarkable thyroid gland. UPPER CHEST: Biapical emphysema. CTA HEAD FINDINGS ANTERIOR CIRCULATION: --Intracranial internal carotid arteries: Normal. --Anterior cerebral arteries: Normal. Both A1 segments are present. Patent anterior communicating artery. --Middle cerebral arteries: Normal. --Posterior communicating arteries: Absent bilaterally. POSTERIOR CIRCULATION: --Basilar artery: Normal. --Posterior cerebral arteries: Normal. --Superior cerebellar arteries: Normal. --Inferior cerebellar arteries: Normal  anterior and posterior inferior cerebellar arteries. VENOUS SINUSES: As permitted by contrast timing, patent. ANATOMIC VARIANTS: None DELAYED PHASE: No parenchymal contrast enhancement. Review of the MIP images confirms the above findings. IMPRESSION: 1. Normal head CT. 2. No emergent large vessel occlusion or hemodynamically significant stenosis. 3. Mild bilateral carotid bifurcation atherosclerotic calcification without stenosis. 4. 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 Electronically Signed   By: Ulyses Jarred M.D.   On: 09/30/2017 20:36   Dg Chest 2 View  Result Date: 09/30/2017 CLINICAL DATA:  Syncope episodes today while out in the sun. Family reports slurred speech. EXAM: CHEST - 2 VIEW COMPARISON:  None. FINDINGS: Normal mediastinum and cardiac silhouette. Normal pulmonary vasculature. No evidence of effusion, infiltrate, or pneumothorax. No acute bony abnormality. IMPRESSION: No acute cardiopulmonary process. Electronically Signed   By: Suzy Bouchard M.D.   On: 09/30/2017 16:31   Ct Angio Neck W And/or Wo Contrast  Result Date: 09/30/2017 CLINICAL DATA:  Slurred speech, left arm weakness and near syncope. EXAM: CT ANGIOGRAPHY HEAD AND NECK TECHNIQUE: Multidetector CT imaging of the head and neck was performed using the standard protocol during bolus administration of intravenous contrast. Multiplanar CT image reconstructions and MIPs were obtained to evaluate the vascular anatomy. Carotid stenosis measurements (when applicable) are obtained utilizing NASCET criteria, using the distal internal carotid diameter as the denominator. CONTRAST:  55mL ISOVUE-370 IOPAMIDOL (ISOVUE-370) INJECTION 76% COMPARISON:  None. FINDINGS: CT HEAD FINDINGS Brain: There is no mass, hemorrhage or extra-axial collection. The size and configuration of the ventricles and extra-axial CSF spaces are normal. There is no acute or chronic infarction. The brain parenchyma is normal. Skull: The visualized skull base, calvarium and  extracranial soft tissues are normal. Sinuses/Orbits: No fluid levels or advanced mucosal thickening of the visualized paranasal sinuses. No mastoid or middle ear effusion. The orbits are normal. CTA NECK FINDINGS AORTIC ARCH: There is no calcific atherosclerosis of the aortic arch. There is no aneurysm, dissection or hemodynamically significant stenosis of the visualized ascending aorta and aortic arch. Conventional 3 vessel aortic branching pattern. The visualized proximal subclavian arteries are widely patent. RIGHT CAROTID SYSTEM: --Common carotid artery: Widely patent origin without common carotid artery dissection or aneurysm. --Internal carotid artery: No dissection, occlusion or aneurysm. Mild atherosclerotic calcification at the carotid bifurcation without hemodynamically significant stenosis. --External carotid artery: No acute abnormality. LEFT CAROTID SYSTEM: --Common carotid artery: Widely patent origin without common carotid artery dissection or aneurysm. --Internal carotid artery:No dissection, occlusion or aneurysm. Mild atherosclerotic calcification at the carotid bifurcation without hemodynamically significant stenosis. --External carotid artery: No acute abnormality. VERTEBRAL ARTERIES: Left dominant configuration. Both origins are normal. No dissection, occlusion or flow-limiting stenosis to the vertebrobasilar confluence. SKELETON: There is no bony spinal canal stenosis. No lytic or blastic lesion. OTHER NECK: Normal pharynx, larynx and major salivary glands. No cervical lymphadenopathy. Unremarkable thyroid gland. UPPER CHEST: Biapical emphysema. CTA HEAD FINDINGS ANTERIOR CIRCULATION: --Intracranial internal carotid arteries: Normal. --Anterior cerebral arteries: Normal. Both A1 segments are present. Patent anterior communicating artery. --Middle cerebral arteries: Normal. --Posterior communicating arteries: Absent bilaterally. POSTERIOR CIRCULATION: --Basilar artery: Normal. --Posterior  cerebral arteries: Normal. --Superior cerebellar arteries: Normal. --Inferior cerebellar arteries: Normal anterior and posterior inferior cerebellar arteries. VENOUS SINUSES: As permitted by contrast timing, patent. ANATOMIC VARIANTS: None DELAYED PHASE: No parenchymal contrast enhancement. Review of the MIP images confirms the above findings. IMPRESSION: 1. Normal head CT. 2. No emergent large vessel occlusion or hemodynamically significant stenosis. 3. Mild bilateral carotid bifurcation atherosclerotic calcification without stenosis. 4. 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 Electronically Signed   By: Ulyses Jarred M.D.   On: 09/30/2017 20:36   Mr Brain Wo Contrast  Result Date: 09/30/2017 CLINICAL DATA:  Stroke follow-up EXAM: MRI HEAD WITHOUT CONTRAST TECHNIQUE: Multiplanar, multiecho pulse sequences of the brain and surrounding structures were obtained without intravenous contrast. COMPARISON:  Head CT 09/30/2017 Brain MRI 01/02/2017 FINDINGS: BRAIN: There is no acute infarct, acute hemorrhage or mass effect. The midline structures are normal. There are no old infarcts. Multifocal white matter hyperintensity, most commonly due to chronic ischemic microangiopathy. The CSF spaces are normal for age, with no hydrocephalus. Susceptibility-sensitive sequences show no chronic microhemorrhage or superficial siderosis. VASCULAR: Major intracranial arterial and venous sinus flow voids are preserved. SKULL AND UPPER CERVICAL SPINE: The visualized skull base, calvarium, upper cervical spine and extracranial soft tissues are normal. SINUSES/ORBITS: No fluid levels or advanced mucosal thickening. No mastoid or middle ear effusion. The orbits are normal. IMPRESSION: Chronic ischemic microangiopathy without acute intracranial abnormality. Electronically Signed   By: Ulyses Jarred M.D.   On: 09/30/2017 21:26   Ct Chest Lung Ca Screen Low Dose W/o Cm  Result Date: 09/09/2017 CLINICAL DATA:  62 year old asymptomatic female  former smoker with 40 pack-year smoking history, quit smoking 4 months prior. EXAM: CT CHEST WITHOUT CONTRAST LOW-DOSE FOR LUNG CANCER SCREENING TECHNIQUE: Multidetector CT imaging of the chest was performed following the standard protocol without IV contrast. COMPARISON:  07/14/2016 screening chest CT. FINDINGS: Cardiovascular: Normal heart size. No significant pericardial effusion/thickening. Left anterior descending coronary atherosclerosis. Atherosclerotic nonaneurysmal thoracic aorta. Normal caliber pulmonary arteries. Mediastinum/Nodes: No discrete thyroid nodules. Unremarkable esophagus. No pathologically enlarged axillary, mediastinal or hilar lymph nodes, noting limited sensitivity for the detection of hilar adenopathy on this noncontrast study. Lungs/Pleura: No pneumothorax. No pleural effusion. Moderate centrilobular emphysema with mild diffuse bronchial wall thickening. No acute consolidative airspace disease or lung masses. No significant growth of the previously visualized tiny solid pulmonary nodule in the right middle lobe. No new significant pulmonary nodules. Upper abdomen: No acute abnormality. Musculoskeletal: No aggressive appearing focal osseous lesions. Moderate thoracic spondylosis. IMPRESSION: 1. Lung-RADS 2, benign appearance or behavior. Continue annual screening with low-dose chest CT without contrast in 12 months. 2. One vessel coronary atherosclerosis. Aortic Atherosclerosis (ICD10-I70.0) and Emphysema (ICD10-J43.9). Electronically Signed   By: Ilona Sorrel M.D.   On: 09/09/2017 09:07    Microbiology: No results found for this or any previous visit (from the past 240 hour(s)).   Labs: Basic Metabolic Panel: Recent Labs  Lab 09/30/17 1617 09/30/17 1730  NA 142 140  K 4.1 4.1  CL 105 102  CO2 26  --   GLUCOSE 96 95  BUN 12 17  CREATININE 1.02* 0.90  CALCIUM 9.5  --    Liver Function Tests: No results for input(s): AST, ALT, ALKPHOS, BILITOT, PROT, ALBUMIN in the last  168 hours. No results for input(s): LIPASE, AMYLASE in the last 168 hours. No results for input(s): AMMONIA in the last 168 hours. CBC: Recent Labs  Lab 09/30/17 1617 09/30/17 1730  WBC 10.1  --   NEUTROABS 7.8*  --  HGB 12.5 13.3  HCT 39.8 39.0  MCV 91.5  --   PLT 272  --    Cardiac Enzymes: Recent Labs  Lab 09/30/17 1617  TROPONINI <0.03   BNP: BNP (last 3 results) No results for input(s): BNP in the last 8760 hours.  ProBNP (last 3 results) No results for input(s): PROBNP in the last 8760 hours.  CBG: No results for input(s): GLUCAP in the last 168 hours.     Signed:  Nita Sells MD   [facility] 10/02/2017, 6:09 PM

## 2017-10-03 ENCOUNTER — Other Ambulatory Visit: Payer: Self-pay | Admitting: Orthopaedic Surgery

## 2017-10-03 DIAGNOSIS — M4326 Fusion of spine, lumbar region: Secondary | ICD-10-CM

## 2017-10-06 ENCOUNTER — Ambulatory Visit (INDEPENDENT_AMBULATORY_CARE_PROVIDER_SITE_OTHER): Payer: Medicare Other | Admitting: Internal Medicine

## 2017-10-06 ENCOUNTER — Encounter: Payer: Self-pay | Admitting: Internal Medicine

## 2017-10-06 VITALS — BP 118/82 | HR 76 | Temp 97.9°F | Resp 16 | Ht 64.0 in | Wt 151.2 lb

## 2017-10-06 DIAGNOSIS — I10 Essential (primary) hypertension: Secondary | ICD-10-CM | POA: Diagnosis not present

## 2017-10-06 DIAGNOSIS — Z79899 Other long term (current) drug therapy: Secondary | ICD-10-CM

## 2017-10-06 DIAGNOSIS — T6701XD Heatstroke and sunstroke, subsequent encounter: Secondary | ICD-10-CM

## 2017-10-06 DIAGNOSIS — R7303 Prediabetes: Secondary | ICD-10-CM

## 2017-10-06 DIAGNOSIS — E782 Mixed hyperlipidemia: Secondary | ICD-10-CM

## 2017-10-06 DIAGNOSIS — G894 Chronic pain syndrome: Secondary | ICD-10-CM | POA: Diagnosis not present

## 2017-10-06 DIAGNOSIS — J449 Chronic obstructive pulmonary disease, unspecified: Secondary | ICD-10-CM

## 2017-10-06 DIAGNOSIS — T670XXD Heatstroke and sunstroke, subsequent encounter: Secondary | ICD-10-CM | POA: Diagnosis not present

## 2017-10-06 NOTE — Progress Notes (Signed)
Amber Hickman     This very nice 62 y.o. single WF was admitted to the hospital on  09/30/2017 and patient was discharged from the hospital on  10/02/2017. The patient now presents for follow up for transition from recent hospitalization..  The day after discharge  our clinical staff contacted the patient to assure stability and schedule a follow up appointment. The discharge summary, medications and diagnostic test results were reviewed before meeting with the patient. The patient was admitted for:   Heat stroke, subsequent encounter Chronic obstructive pulmonary disease Chronic pain syndrome Essential hypertension Hyperlipidemia, mixed Prediabetes     After working outside, patient presented to the ER w/Presyncope, slurred speech and dehydration. For concern of possible TIA, patient had NeuroHospitalist consultation Head CT, Brain MRI and Ct angiogram. Ultimately , as she improved w/IVF's and brain scans were WNL it was determined that she only had a heat stress syndrome and was d/c'd in improved condition. Marland Kitchen      Hospitalization discharge instructions and medications are reconciled with the patient.      Patient is also followed with Hypertension, Hyperlipidemia, Pre-Diabetes and Vitamin D Deficiency. Shealso has Chronic Lumbago consequent of Lumbar DDD with hx/o surg x 2 in 2004 and with consequental chronic pain. In 2013, she  lost her job in a downsize at [employer] and shortly thereafter was awarded [employer]     Patient is treated for HTN (1998)  & BP has been controlled at home. Today's BP is at goal - 118/82. Patient has had no complaints of any cardiac type chest pain, palpitations, dyspnea/orthopnea/PND, dizziness, claudication, or dependent edema.     Patient also has COPD after 35+ years of smoking.     Hyperlipidemia is controlled with diet & meds. Patient denies myalgias or other med SE's. Last Lipids were  Lab Results  Component Value Date   CHOL 155  10/01/2017   HDL 55 10/01/2017   LDLCALC 79 10/01/2017   TRIG 103 10/01/2017   CHOLHDL 2.8 10/01/2017      Also, the patient has history of PreDiabetes (A1c 5.8% in 2010) and has had no symptoms of reactive hypoglycemia, diabetic polys, paresthesias or visual blurring.  Last A1c was at goal: Lab Results  Component Value Date   HGBA1C 5.8 (H) 10/01/2017      Further, the patient also has history of Vitamin D Deficiency ("20"/2008)  and supplements vitamin D without any suspected side-effects. Last vitamin D was at goal: Lab Results  Component Value Date   VD25OH 65 07/26/2017   Current Outpatient Medications on File Prior to Visit  Medication Sig  . baclofen (LIORESAL) 10 MG tablet Take 10 mg 3 (three) times daily as needed by mouth for muscle spasms.  . cholecalciferol 5000 units TABS Take 2 tablets daily  . FLUoxetine (PROZAC) 40 MG capsule TAKE ONE CAPSULE BY MOUTH TWICE DAILY (Patient taking differently: Take 40 mg by mouth daily. )  . ketotifen (ZADITOR) 0.025 % ophthalmic solution Place 1 drop into both eyes 3 (three) times daily as needed (allergies).  Marland Kitchen oxyCODONE (ROXICODONE) 15 MG immediate release tablet Take 15 mg by mouth every 6 (six) hours as needed for pain.   . simvastatin (ZOCOR) 40 MG tablet take 1 tablet by mouth at bedtime (Patient taking differently: Take 20 mg by mouth every evening. )   No current facility-administered medications on file prior to visit.    Allergies  Allergen Reactions  . Augmentin [Amoxicillin-Pot Clavulanate] Nausea  And Vomiting  . Codeine Nausea And Vomiting  . Lipitor [Atorvastatin]     Leg pain  . Wellbutrin [Bupropion]     Pt does not recall reaction   . Zoloft [Sertraline Hcl]     Pt does not recall reaction    PMHx:   Past Medical History:  Diagnosis Date  . Allergy   . Arthritis   . AVN (avascular necrosis of bone) (HCC)    Left shoulder  . COPD (chronic obstructive pulmonary disease) ([place])   . Depression   . Diabetes  mellitus without complication (HCC)    Pre-diabetes.   Marland Kitchen GERD (gastroesophageal reflux disease)   . Hyperlipidemia   . Hypertension   . Memory disorder 12/23/2016  . Vitamin D deficiency    Immunization History  Administered Date(s) Administered  . Td 01/19/2004, 06/05/2015   Past Surgical History:  Procedure Laterality Date  . ANKLE SURGERY Right   . BACK SURGERY    . BREAST EXCISIONAL BIOPSY Right   . CATARACT EXTRACTION Bilateral   . SPINE SURGERY     lumbar  . TONSILLECTOMY AND ADENOIDECTOMY     FHx:    Reviewed / unchanged  SHx:    Reviewed / unchanged  Systems Review:  Constitutional: Denies fever, chills, wt changes, headaches, insomnia, fatigue, night sweats, change in appetite. Eyes: Denies redness, blurred vision, diplopia, discharge, itchy, watery eyes.  ENT: Denies discharge, congestion, post nasal drip, epistaxis, sore throat, earache, hearing loss, dental pain, tinnitus, vertigo, sinus pain, snoring.  CV: Denies chest pain, palpitations, irregular heartbeat, syncope, dyspnea, diaphoresis, orthopnea, PND, claudication or edema. Respiratory: denies cough, dyspnea, DOE, pleurisy, hoarseness, laryngitis, wheezing.  Gastrointestinal: Denies dysphagia, odynophagia, heartburn, reflux, water brash, abdominal pain or cramps, nausea, vomiting, bloating, diarrhea, constipation, hematemesis, melena, hematochezia  or hemorrhoids. Genitourinary: Denies dysuria, frequency, urgency, nocturia, hesitancy, discharge, hematuria or flank pain. Musculoskeletal: Denies arthralgias, myalgias, stiffness, jt. swelling, pain, limping or strain/sprain.  Skin: Denies pruritus, rash, hives, warts, acne, eczema or change in skin lesion(s). Neuro: No weakness, tremor, incoordination, spasms, paresthesia or pain. Psychiatric: Denies confusion, memory loss or sensory loss. Endo: Denies change in weight, skin or hair change.  Heme/Lymph: No excessive bleeding, bruising or enlarged lymph  nodes.  Physical Exam  BP 118/82   Pulse 76   Temp 97.9 F (36.6 C)   Resp 16   Ht 5\' 4"  (1.626 m)   Wt 151 lb 3.2 oz (68.6 kg)   BMI 25.95 kg/m   Appears Over nourished, well groomed  and in no distress.  Eyes: PERRLA, EOMs, conjunctiva no swelling or erythema. Sinuses: No frontal/maxillary tenderness ENT/Mouth: EAC's clear, TM's nl w/o erythema, bulging. Nares clear w/o erythema, swelling, exudates. Oropharynx clear without erythema or exudates. Oral hygiene is good. Tongue normal, non obstructing. Hearing intact.  Neck: Supple. Thyroid nl. Car 2+/2+ without bruits, nodes or JVD. Chest: Respirations nl with BS clear & equal w/o rales, rhonchi, wheezing or stridor.  Cor: Heart sounds normal w/ regular rate and rhythm without sig. murmurs, gallops, clicks or rubs. Peripheral pulses normal and equal  without edema.  Abdomen: Soft & bowel sounds normal. Non-tender w/o guarding, rebound, hernias, masses or organomegaly.  Lymphatics: Unremarkable.  Musculoskeletal: Full ROM all peripheral extremities, joint stability, 5/5 strength and normal gait.  Skin: Warm, dry without exposed rashes, lesions or ecchymosis apparent.  Neuro: Cranial nerves intact, reflexes equal bilaterally. Sensory-motor testing grossly intact. Tendon reflexes grossly intact.  Pysch: Alert & oriented x 3.  Insight and  judgement nl & appropriate. No ideations.  Assessment and Plan:  1. Heat stroke, subsequent encounter  - CBC with Differential/Platelet - COMPLETE METABOLIC PANEL WITH GFR  2. Chronic obstructive pulmonary disease, unspecified COPD type ([place])  3. Chronic pain syndrome  4. Essential hypertension  - Continue medication, monitor blood pressure at home.  - Continue DASH diet. Reminder to go to the ER if any CP,  SOB, nausea, dizziness, severe HA, changes vision/speech.   - CBC with Differential/Platelet - COMPLETE METABOLIC PANEL WITH GFR  5. Hyperlipidemia, mixed  - Continue diet/meds,  exercise,& lifestyle modifications.  - Continue monitor periodic cholesterol/liver & renal functions    6. Prediabetes  - Continue diet, exercise, lifestyle modifications.  - Monitor appropriate labs. - Continue supplementation.   7. Medication management  - CBC with Differential/Platelet - COMPLETE METABOLIC PANEL WITH GFR      Discussed  regular exercise, BP monitoring, weight control to achieve/maintain BMI less than 25 and discussed meds and SE's. Recommended labs to assess and monitor clinical status with further disposition pending results of labs. Over 45 minutes of exam, counseling, chart review was performed.

## 2017-10-06 NOTE — Patient Instructions (Signed)
Heat Exhaustion Information °WHAT IS HEAT EXHAUSTION? °Heat exhaustion happens when your body gets overheated from hot weather or from exercise. Heat exhaustion can lead to heat stroke, a life-threatening condition that requires emergency care. °Heat exhaustion is more likely to develop when: °· You are exercising or being active. °· You are in hot or humid weather. °· You are in bright sunshine. °· You are not drinking enough water. ° °WHO IS AT RISK FOR THIS CONDITION? °This condition is more likely to develop in: °· People who exercise in hot or humid weather. °· People who exercise beyond their fitness level. °· People who wear clothing that does not allow sweat to evaporate. °· People who are dehydrated. °· People who drink a lot of alcoholic beverages or beverages that have caffeine. This can lead to dehydration. °· People who are age 65 or older. °· Children. °· People who have a medical condition such as heart disease, poor circulation, sickle cell disease, or high blood pressure. °· People who have a fever. °· People who are very overweight (obese). ° °WHAT ARE THE SYMPTOMS OF THIS CONDITION? °Symptoms of heat exhaustion include: °· Heavy sweating along with feeling weak, dizzy, light-headed, and nauseous. °· Rapid heartbeat. °· Headache. °· Urine that is darker than normal. °· Muscle cramps, such as in the leg or side (flank). °· Moist, cool, and clammy skin. °· Fatigue. °· Thirst. °· Confusion. °· Fainting. ° °WHAT SHOULD I DO IF I THINK I HAVE THIS CONDITION? °If you think that you have heat exhaustion, call your health care provider. Follow his or her instructions. You should also: °· Call a friend or a family member and ask him or her to stay with you. °· Move to a cooler location, such as: °? Into the shade. °? In front of a fan. °? An air-conditioned space. °· Lie down and rest. °· Slowly drink nonalcoholic, caffeine-free fluids. °· Take off tight clothing or extra clothing. °· Take a cool bath or  shower, if possible. If you do not have access to a bath or shower, dab or mist cool water on your skin. ° °WHY IS IT IMPORTANT TO TREAT THIS CONDITION? °It is important to take care of yourself and treat heat exhaustion as soon as possible. Untreated heat exhaustion can turn into heat stroke, which is a life-threatening condition that requires urgent medical treatment. °HOW CAN I PREVENT THIS CONDITION? °To prevent this condition: °· Drink enough fluid to keep your urine clear or pale yellow. This helps your body to sweat properly. °· Avoid outdoor activities on very hot or humid days. °· Do not exercise or do other physical activity when you are not feeling well. °· Take breaks often during physical activity. °· Wear light-colored, loose-fitting, and lightweight clothing when it is hot outside. °· Wear a hat and use sunscreen when exercising outdoors. °· Avoid being outside during the hottest times of the day. °· Check with your health care provider before you start any new activity, especially if you take medicine or have a medical condition. °· Start any new activity slowly and work up to your fitness level. ° °HOW CAN I HELP TO PROTECT ELDERLY RELATIVES AND NEIGHBORS FROM THIS CONDITION? °People who are age 65 or older are at greater risk for heat exhaustion. Their bodies have a harder time adjusting to heat. They are also more likely to have a medical condition or be on medicines that increase their risk for heat exhaustion. They may get heat exhaustion   indoors if the heat is high for several days. You can help to protect them during hot weather by: °· Checking on them two or more times each day. °· Making sure that they are drinking plenty of cool, nonalcoholic, and caffeine-free fluids. °· Making sure that they use their air conditioner. °· Taking them to a location where air conditioning is available. °· Talking with their health care provider about their medical needs, medicines, and fluid  requirements. ° °SEEK MEDICAL CARE IF: °· Your symptoms last longer than 30 minutes. ° °SEEK IMMEDIATE MEDICAL CARE IF: °· You have any symptoms of heat stroke. These include: °? Fever. °? Vomiting. °? Red skin. °? Inability to sweat, resulting in hot, dry skin. °? Excessive thirst. °? Rapid breathing. °? Headache. °? Confusion or disorientation. °? Fainting. °? Seizures. °These symptoms may represent a serious problem that is an emergency. Do not wait to see if the symptoms will go away. Get medical help right away. Call your local emergency services (911 in the [place] Do not drive yourself to the hospital. °This information is not intended to replace advice given to you by your health care provider. Make sure you discuss any questions you have with your health care provider. °Document Released: 11/11/2007 Document Revised: 08/22/2015 Document Reviewed: 05/25/2015 °Elsevier Interactive Patient Education © 2018 [employer] ° °

## 2017-10-07 LAB — COMPLETE METABOLIC PANEL WITH GFR
AG Ratio: 1.5 (calc) (ref 1.0–2.5)
ALBUMIN MSPROF: 4.3 g/dL (ref 3.6–5.1)
ALKALINE PHOSPHATASE (APISO): 119 U/L (ref 33–130)
ALT: 28 U/L (ref 6–29)
AST: 28 U/L (ref 10–35)
BILIRUBIN TOTAL: 0.4 mg/dL (ref 0.2–1.2)
BUN: 12 mg/dL (ref 7–25)
CHLORIDE: 107 mmol/L (ref 98–110)
CO2: 29 mmol/L (ref 20–32)
Calcium: 10.4 mg/dL (ref 8.6–10.4)
Creat: 0.97 mg/dL (ref 0.50–0.99)
GFR, Est African American: 73 mL/min/{1.73_m2} (ref >=60)
GFR, Est Non African American: 63 mL/min/{1.73_m2} (ref >=60)
GLUCOSE: 87 mg/dL (ref 65–99)
Globulin: 2.8 g/dL (calc) (ref 1.9–3.7)
Potassium: 6.2 mmol/L (ref 3.5–5.3)
Sodium: 143 mmol/L (ref 135–146)
Total Protein: 7.1 g/dL (ref 6.1–8.1)

## 2017-10-07 LAB — CBC WITH DIFFERENTIAL/PLATELET
BASOS PCT: 0.4 %
Basophils Absolute: 27 cells/uL (ref 0–200)
EOS PCT: 1.5 %
Eosinophils Absolute: 101 cells/uL (ref 15–500)
HCT: 43 % (ref 35.0–45.0)
Hemoglobin: 14.3 g/dL (ref 11.7–15.5)
Lymphs Abs: 1990 cells/uL (ref 850–3900)
MCH: 28.4 pg (ref 27.0–33.0)
MCHC: 33.3 g/dL (ref 32.0–36.0)
MCV: 85.5 fL (ref 80.0–100.0)
MONOS PCT: 8.7 %
MPV: 11 fL (ref 7.5–12.5)
Neutro Abs: 4000 cells/uL (ref 1500–7800)
Neutrophils Relative %: 59.7 %
Platelets: 312 10*3/uL (ref 140–400)
RBC: 5.03 10*6/uL (ref 3.80–5.10)
RDW: 13.5 % (ref 11.0–15.0)
TOTAL LYMPHOCYTE: 29.7 %
WBC mixed population: 583 cells/uL (ref 200–950)
WBC: 6.7 10*3/uL (ref 3.8–10.8)

## 2017-10-09 ENCOUNTER — Ambulatory Visit
Admission: RE | Admit: 2017-10-09 | Discharge: 2017-10-09 | Disposition: A | Discharge status: Home / Self Care | Payer: Medicare Other | Source: Ambulatory Visit | Attending: Orthopaedic Surgery | Admitting: Orthopaedic Surgery

## 2017-10-09 DIAGNOSIS — M5416 Radiculopathy, lumbar region: Secondary | ICD-10-CM | POA: Diagnosis not present

## 2017-10-09 DIAGNOSIS — M4326 Fusion of spine, lumbar region: Secondary | ICD-10-CM

## 2017-10-27 DIAGNOSIS — M542 Cervicalgia: Secondary | ICD-10-CM | POA: Diagnosis not present

## 2017-10-27 DIAGNOSIS — M4326 Fusion of spine, lumbar region: Secondary | ICD-10-CM | POA: Diagnosis not present

## 2017-11-02 DIAGNOSIS — K648 Other hemorrhoids: Secondary | ICD-10-CM | POA: Diagnosis not present

## 2017-11-06 NOTE — Progress Notes (Signed)
FOLLOW UP  Assessment and Plan:   COPD/smoking Quit smoking 04/2017; doing well; asymptomatic Continue to monitor  Atherosclerosis of aorta Control blood pressure, cholesterol, glucose, increase exercise.   Hypertension Well controlled currently off of medications Monitor blood pressure at home; patient to call if consistently greater than 130/80 Continue DASH diet.   Reminder to go to the ER if any CP, SOB, nausea, dizziness, severe HA, changes vision/speech, left arm numbness and tingling and jaw pain.  Cholesterol Currently near goal of LDL <70; simvastatin 20 mg daily at this time Continue low cholesterol diet and exercise.  Check lipid panel.   Prediabetes Continue diet and exercise.  Perform daily foot/skin check, notify office of any concerning changes.  Check A1C  BMI 26 Continue to recommend diet heavy in fruits and veggies and low in animal meats, cheeses, and dairy products, appropriate calorie intake Discuss exercise recommendations routinely She would like to reduce weight to 130 lb, initial goal of 145 lb set today, recommended water aerobics for activity due to extensive joint pain Continue to monitor weight at each visit  Vitamin D Def Near goal at last visit; continue supplementation to maintain goal of 70-100 Defer Vit D level  Depression Continue medications Lifestyle discussed: diet/exerise, sleep hygiene, stress management, hydration   Continue diet and meds as discussed. Further disposition pending results of labs. Discussed med's effects and SE's.   Over 30 minutes of exam, counseling, chart review, and critical decision making was performed.   Future Appointments  Date Time Provider [place]  02/14/2018 10:30 AM Unk Pinto, MD [facility] None  05/01/2018 10:30 AM Vicie Mutters, PA-C [facility] None  08/10/2018  2:00 PM Unk Pinto, MD [facility] None     ----------------------------------------------------------------------------------------------------------------------  HPI 62 y.o. female  presents for 3 month follow up on hypertension, cholesterol, prediabetes, weight and vitamin D deficiency. The patient has has Chronic LBP from lumbar DDD s/p surgery x 2 by Dr. Patrice Paradise in 2004 and is on SS Disability for chronic pain and is followed by Dr Greta Doom for pain management, may be restarting injections. She has depression stable on SSRI (prozac) treatment. She has a 35+ year smoking history and consequent COPD, reports smoking cessation 05/10/2017 and continues to do well. 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 Tamera Punt.  BMI is Body mass index is 26.09 kg/m., she has been working on diet, exercise limited due to chronic pain. She plans to give up sodas and would like to get down to 130 lb.  Wt Readings from Last 3 Encounters:  11/09/17 152 lb (68.9 kg)  10/06/17 151 lb 3.2 oz (68.6 kg)  09/30/17 158 lb 4.6 oz (71.8 kg)   Her blood pressure has been controlled at home, today their BP is BP: 106/66  She does not workout. She denies chest pain, shortness of breath, dizziness.   She is on cholesterol medication Simvastatin 1/2 daily and denies myalgias. Her cholesterol is not at goal. The cholesterol last visit was:   Lab Results  Component Value Date   CHOL 155 10/01/2017   HDL 55 10/01/2017   LDLCALC 79 10/01/2017   TRIG 103 10/01/2017   CHOLHDL 2.8 10/01/2017    She has been working on diet for prediabetes, and denies foot ulcerations, increased appetite, nausea, polydipsia, polyuria and visual disturbances. Last A1C in the office was:  Lab Results  Component Value Date   HGBA1C 5.8 (H) 10/01/2017   Patient is on Vitamin D supplement.   Lab  Results  Component Value Date   VD25OH 65 07/26/2017        Current Medications:  Current Outpatient Medications on File Prior to Visit  Medication Sig  .  baclofen (LIORESAL) 10 MG tablet Take 10 mg 3 (three) times daily as needed by mouth for muscle spasms.  . cholecalciferol 5000 units TABS Take 2 tablets daily  . FLUoxetine (PROZAC) 40 MG capsule TAKE ONE CAPSULE BY MOUTH TWICE DAILY (Patient taking differently: Take 40 mg by mouth daily. )  . ketotifen (ZADITOR) 0.025 % ophthalmic solution Place 1 drop into both eyes 3 (three) times daily as needed (allergies).  Marland Kitchen oxyCODONE (ROXICODONE) 15 MG immediate release tablet Take 15 mg by mouth every 6 (six) hours as needed for pain.   . simvastatin (ZOCOR) 40 MG tablet take 1 tablet by mouth at bedtime (Patient taking differently: Take 20 mg by mouth every evening. )   No current facility-administered medications on file prior to visit.      Allergies:  Allergies  Allergen Reactions  . Augmentin [Amoxicillin-Pot Clavulanate] Nausea And Vomiting  . Codeine Nausea And Vomiting  . Lipitor [Atorvastatin]     Leg pain  . Wellbutrin [Bupropion]     Pt does not recall reaction   . Zoloft [Sertraline Hcl]     Pt does not recall reaction      Medical History:  Past Medical History:  Diagnosis Date  . Allergy   . Arthritis   . AVN (avascular necrosis of bone) ([facility])    Left shoulder  . COPD (chronic obstructive pulmonary disease) ([place])   . Depression   . Diabetes mellitus without complication ([facility])    Pre-diabetes.   Marland Kitchen GERD (gastroesophageal reflux disease)   . Hyperlipidemia   . Hypertension   . Memory disorder 12/23/2016  . Vitamin D deficiency    Family history- Reviewed and unchanged Social history- Reviewed and unchanged   Review of Systems:  Review of Systems  Constitutional: Negative for malaise/fatigue and weight loss.  HENT: Negative for hearing loss and tinnitus.   Eyes: Negative for blurred vision and double vision.  Respiratory: Negative for cough, shortness of breath and wheezing.   Cardiovascular: Negative for chest pain, palpitations, orthopnea, claudication and leg  swelling.  Gastrointestinal: Negative for abdominal pain, blood in stool, constipation, diarrhea, heartburn, melena, nausea and vomiting.  Genitourinary: Negative.   Musculoskeletal: Positive for back pain and joint pain (bilateral hips, knees, left shoulder, known AVN - has seen Dr. Tamera Punt). Negative for myalgias.  Skin: Negative for rash.  Neurological: Negative for dizziness, tingling, sensory change, weakness and headaches.  Endo/Heme/Allergies: Negative for polydipsia.  Psychiatric/Behavioral: Negative.   All other systems reviewed and are negative.    Physical Exam: BP 106/66   Pulse 85   Temp (!) 97.3 F (36.3 C)   Ht 5\' 4"  (1.626 m)   Wt 152 lb (68.9 kg)   SpO2 92%   BMI 26.09 kg/m  Wt Readings from Last 3 Encounters:  11/09/17 152 lb (68.9 kg)  10/06/17 151 lb 3.2 oz (68.6 kg)  09/30/17 158 lb 4.6 oz (71.8 kg)   General Appearance: Well nourished, in no apparent distress. Eyes: PERRLA, EOMs, conjunctiva no swelling or erythema Sinuses: No Frontal/maxillary tenderness ENT/Mouth: Ext aud canals clear, TMs without erythema, bulging. No erythema, swelling, or exudate on post pharynx.  Tonsils not swollen or erythematous. Hearing normal.  Neck: Supple, thyroid normal.  Respiratory: Respiratory effort normal, BS equal bilaterally without rales, rhonchi, wheezing or  stridor.  Cardio: RRR with no MRGs. Brisk peripheral pulses without edema.  Abdomen: Soft, + BS.  Non tender, no guarding, rebound, hernias, masses. Lymphatics: Non tender without lymphadenopathy.  Musculoskeletal: Full ROM, symmetrical strength, Antalgic gait Skin: Warm, dry without rashes, lesions, ecchymosis.  Neuro: Cranial nerves intact. No cerebellar symptoms.  Psych: Awake and oriented X 3, normal affect, Insight and Judgment appropriate.    Izora Ribas, NP 10:51 AM [facility]

## 2017-11-09 ENCOUNTER — Ambulatory Visit (INDEPENDENT_AMBULATORY_CARE_PROVIDER_SITE_OTHER): Payer: Medicare Other | Admitting: Adult Health

## 2017-11-09 ENCOUNTER — Encounter: Payer: Self-pay | Admitting: Adult Health

## 2017-11-09 VITALS — BP 106/66 | HR 85 | Temp 97.3°F | Ht 64.0 in | Wt 152.0 lb

## 2017-11-09 DIAGNOSIS — F17200 Nicotine dependence, unspecified, uncomplicated: Secondary | ICD-10-CM | POA: Diagnosis not present

## 2017-11-09 DIAGNOSIS — I700 Atherosclerosis of aorta: Secondary | ICD-10-CM | POA: Diagnosis not present

## 2017-11-09 DIAGNOSIS — E559 Vitamin D deficiency, unspecified: Secondary | ICD-10-CM | POA: Diagnosis not present

## 2017-11-09 DIAGNOSIS — R7309 Other abnormal glucose: Secondary | ICD-10-CM | POA: Diagnosis not present

## 2017-11-09 DIAGNOSIS — I10 Essential (primary) hypertension: Secondary | ICD-10-CM

## 2017-11-09 DIAGNOSIS — Z79899 Other long term (current) drug therapy: Secondary | ICD-10-CM | POA: Diagnosis not present

## 2017-11-09 DIAGNOSIS — J449 Chronic obstructive pulmonary disease, unspecified: Secondary | ICD-10-CM

## 2017-11-09 DIAGNOSIS — F3341 Major depressive disorder, recurrent, in partial remission: Secondary | ICD-10-CM

## 2017-11-09 DIAGNOSIS — E782 Mixed hyperlipidemia: Secondary | ICD-10-CM

## 2017-11-09 DIAGNOSIS — Z6826 Body mass index (BMI) 26.0-26.9, adult: Secondary | ICD-10-CM

## 2017-11-09 NOTE — Patient Instructions (Signed)
Goals    . Blood Pressure < 130/80    . LDL CALC < 70    . Weight (lb) < 145 lb (65.8 kg)       Avascular Necrosis Avascular necrosis is a disease resulting from the temporary or permanent loss of blood supply to a bone. This disease may also be known as:  Osteonecrosis.  Aseptic necrosis.  Ischemic bone necrosis.  Without proper blood supply, the internal layer of the affected bone dies and the outer layer of the bone may break down. If this process affects a bone near a joint, it may lead to collapse of that joint. Common bones that are affected by this condition include:  The top of your thigh bone (femoral head).  One or more bones in your wrist (scaphoid orlunate).  One or more bones in your foot (metatarsals).  One of the bones in your ankle (navicular).  The joint most commonly affected by this condition is the hip joint. Avascular necrosis rarely occurs in more than one bone at a time. What are the causes?  Damage or injury to a bone or joint.  Using corticosteroid medicine for a long period of time.  Changes in your immune or hormone systems.  Excessive exposure to radiation. What increases the risk?  Alcohol abuse.  Previous traumatic injury to a joint.  Using corticosteroid medicines for a long period of time or often.  Having a medical condition such as: ? HIV or AIDS. ? Diabetes. ? Sickle cell disease. ? An autoimmune disease. What are the signs or symptoms? The main symptoms of avascular necrosis are pain and decreased motion in the affected bone or joint. In the early stages the pain may be minor and occur only with activity. As avascular necrosis progresses, pain may gradually worsen and occur while at rest. The pain may suddenly become severe if an affected joint collapses. How is this diagnosed? Avascular necrosis may be diagnosed with:  A medical history.  A physical exam.  X-rays.  An MRI.  A bone scan.  How is this  treated? Treatments may include:  Medicine to help relieve pain.  Avoiding placing any pressure or weight ontheaffected area. If avascular necrosis occurs in your hip, ankle, or foot, you may be instructed to use crutches or a rolling scooter.  Surgery, such as: ? Core decompression. In this surgery, one or more holes are placed in the bone for new blood vessels to grow into. This provides a renewed blood supply to the bone. Core decompression can often reduce pain and pressure in the affected bone and slow the progression of bone and joint destruction. ? Osteotomy. In this surgery, the bone is reshaped to reduce stress on the affected area of the joint. ? Bone grafting. In this surgery, healthy bone from one part of your body is transplanted to the affected area. ? Arthroplasty. Arthroplasty is also known as total joint replacement. In this surgery, the affected surface on one or both sides of a joint is replaced with artificial parts (prostheses).  Electrical stimulation. This may help encourage new bone growth.  Follow these instructions at home:  Take medicines only as directed by your health care provider.  Follow your health care provider's recommendations on limiting activities or using crutches to rest your affected joint.  Meet with aphysical therapist as directed by your health care provider.  Keep all follow-up visits as directed by your health care provider. This is important. Contact a health care provider if:  Your pain worsens.  You have decreased motion in your affected joint. Get help right away if: Your pain suddenly becomes severe. This information is not intended to replace advice given to you by your health care provider. Make sure you discuss any questions you have with your health care provider. Document Released: 07/24/2001 Document Revised: 07/10/2015 Document Reviewed: 04/11/2013 Elsevier Interactive Patient Education  [employer].

## 2017-11-10 LAB — COMPLETE METABOLIC PANEL WITH GFR
AG Ratio: 1.6 (calc) (ref 1.0–2.5)
ALBUMIN MSPROF: 4.4 g/dL (ref 3.6–5.1)
ALKALINE PHOSPHATASE (APISO): 123 U/L (ref 33–130)
ALT: 23 U/L (ref 6–29)
AST: 26 U/L (ref 10–35)
BILIRUBIN TOTAL: 0.6 mg/dL (ref 0.2–1.2)
BUN / CREAT RATIO: 6 (calc) (ref 6–22)
BUN: 6 mg/dL — ABNORMAL LOW (ref 7–25)
CHLORIDE: 104 mmol/L (ref 98–110)
CO2: 27 mmol/L (ref 20–32)
CREATININE: 0.98 mg/dL (ref 0.50–0.99)
Calcium: 10.5 mg/dL — ABNORMAL HIGH (ref 8.6–10.4)
GFR, Est African American: 72 mL/min/{1.73_m2} (ref >=60)
GFR, Est Non African American: 62 mL/min/{1.73_m2} (ref >=60)
GLOBULIN: 2.7 g/dL (ref 1.9–3.7)
GLUCOSE: 80 mg/dL (ref 65–99)
POTASSIUM: 4.9 mmol/L (ref 3.5–5.3)
SODIUM: 141 mmol/L (ref 135–146)
Total Protein: 7.1 g/dL (ref 6.1–8.1)

## 2017-11-10 LAB — LIPID PANEL
CHOLESTEROL: 175 mg/dL (ref <200)
HDL: 64 mg/dL (ref >50)
LDL Cholesterol (Calc): 84 mg/dL (calc)
Non-HDL Cholesterol (Calc): 111 mg/dL (calc) (ref <130)
Total CHOL/HDL Ratio: 2.7 (calc) (ref <5.0)
Triglycerides: 168 mg/dL — ABNORMAL HIGH (ref <150)

## 2017-11-10 LAB — CBC WITH DIFFERENTIAL/PLATELET
BASOS ABS: 31 {cells}/uL (ref 0–200)
Basophils Relative: 0.5 %
EOS ABS: 92 {cells}/uL (ref 15–500)
Eosinophils Relative: 1.5 %
HEMATOCRIT: 42.5 % (ref 35.0–45.0)
Hemoglobin: 14.1 g/dL (ref 11.7–15.5)
LYMPHS ABS: 1720 {cells}/uL (ref 850–3900)
MCH: 29.4 pg (ref 27.0–33.0)
MCHC: 33.2 g/dL (ref 32.0–36.0)
MCV: 88.7 fL (ref 80.0–100.0)
MPV: 10.8 fL (ref 7.5–12.5)
Monocytes Relative: 9 %
NEUTROS PCT: 60.8 %
Neutro Abs: 3709 cells/uL (ref 1500–7800)
PLATELETS: 318 10*3/uL (ref 140–400)
RBC: 4.79 10*6/uL (ref 3.80–5.10)
RDW: 13.2 % (ref 11.0–15.0)
TOTAL LYMPHOCYTE: 28.2 %
WBC: 6.1 10*3/uL (ref 3.8–10.8)
WBCMIX: 549 {cells}/uL (ref 200–950)

## 2017-11-10 LAB — HEMOGLOBIN A1C
EAG (MMOL/L): 6.3 (calc)
Hgb A1c MFr Bld: 5.6 % of total Hgb (ref <5.7)
MEAN PLASMA GLUCOSE: 114 (calc)

## 2017-11-10 LAB — TSH: TSH: 1.08 mIU/L (ref 0.40–4.50)

## 2017-11-16 DIAGNOSIS — K648 Other hemorrhoids: Secondary | ICD-10-CM | POA: Diagnosis not present

## 2017-11-17 DIAGNOSIS — G894 Chronic pain syndrome: Secondary | ICD-10-CM | POA: Diagnosis not present

## 2017-11-17 DIAGNOSIS — Z79891 Long term (current) use of opiate analgesic: Secondary | ICD-10-CM | POA: Diagnosis not present

## 2017-11-17 DIAGNOSIS — M62830 Muscle spasm of back: Secondary | ICD-10-CM | POA: Diagnosis not present

## 2017-11-17 DIAGNOSIS — M47816 Spondylosis without myelopathy or radiculopathy, lumbar region: Secondary | ICD-10-CM | POA: Diagnosis not present

## 2017-11-17 DIAGNOSIS — M47812 Spondylosis without myelopathy or radiculopathy, cervical region: Secondary | ICD-10-CM | POA: Diagnosis not present

## 2017-11-24 DIAGNOSIS — M47812 Spondylosis without myelopathy or radiculopathy, cervical region: Secondary | ICD-10-CM | POA: Diagnosis not present

## 2017-11-24 DIAGNOSIS — M542 Cervicalgia: Secondary | ICD-10-CM | POA: Diagnosis not present

## 2017-11-24 DIAGNOSIS — M4326 Fusion of spine, lumbar region: Secondary | ICD-10-CM | POA: Diagnosis not present

## 2017-12-02 ENCOUNTER — Other Ambulatory Visit: Payer: Self-pay | Admitting: Orthopaedic Surgery

## 2017-12-02 DIAGNOSIS — M542 Cervicalgia: Secondary | ICD-10-CM

## 2017-12-07 DIAGNOSIS — K648 Other hemorrhoids: Secondary | ICD-10-CM | POA: Diagnosis not present

## 2017-12-10 ENCOUNTER — Ambulatory Visit
Admission: RE | Admit: 2017-12-10 | Discharge: 2017-12-10 | Disposition: A | Discharge status: Home / Self Care | Payer: Medicare Other | Source: Ambulatory Visit | Attending: Orthopaedic Surgery | Admitting: Orthopaedic Surgery

## 2017-12-10 DIAGNOSIS — M4802 Spinal stenosis, cervical region: Secondary | ICD-10-CM | POA: Diagnosis not present

## 2017-12-10 DIAGNOSIS — M542 Cervicalgia: Secondary | ICD-10-CM

## 2017-12-14 ENCOUNTER — Other Ambulatory Visit (INDEPENDENT_AMBULATORY_CARE_PROVIDER_SITE_OTHER): Payer: Self-pay | Admitting: Otolaryngology

## 2017-12-15 DIAGNOSIS — Z79899 Other long term (current) drug therapy: Secondary | ICD-10-CM | POA: Diagnosis not present

## 2017-12-15 DIAGNOSIS — G894 Chronic pain syndrome: Secondary | ICD-10-CM | POA: Diagnosis not present

## 2017-12-15 DIAGNOSIS — Z79891 Long term (current) use of opiate analgesic: Secondary | ICD-10-CM | POA: Diagnosis not present

## 2017-12-15 DIAGNOSIS — M47812 Spondylosis without myelopathy or radiculopathy, cervical region: Secondary | ICD-10-CM | POA: Diagnosis not present

## 2017-12-15 DIAGNOSIS — M4326 Fusion of spine, lumbar region: Secondary | ICD-10-CM | POA: Diagnosis not present

## 2017-12-16 ENCOUNTER — Other Ambulatory Visit: Payer: Self-pay | Admitting: Internal Medicine

## 2018-01-17 DIAGNOSIS — G894 Chronic pain syndrome: Secondary | ICD-10-CM | POA: Diagnosis not present

## 2018-01-17 DIAGNOSIS — M542 Cervicalgia: Secondary | ICD-10-CM | POA: Diagnosis not present

## 2018-01-17 DIAGNOSIS — M4326 Fusion of spine, lumbar region: Secondary | ICD-10-CM | POA: Diagnosis not present

## 2018-01-17 DIAGNOSIS — M545 Low back pain: Secondary | ICD-10-CM | POA: Diagnosis not present

## 2018-01-20 DIAGNOSIS — H43812 Vitreous degeneration, left eye: Secondary | ICD-10-CM | POA: Diagnosis not present

## 2018-02-13 ENCOUNTER — Encounter: Payer: Self-pay | Admitting: Physician Assistant

## 2018-02-13 NOTE — Progress Notes (Signed)
This very nice 62 y.o. single WF who presents for 6 month follow up with HTN, HLD, Pre-Diabetes and Vitamin D Deficiency. Patient is on SS Disability for Chronic pain consequent of Lumbar DJD/DDD s/p lumbar surg x 2 and also for her Depression. She feels that her Depression is in control on her current meds. She is followed at the [facility].      Patient is treated for HTN (1998) & BP has been controlled at home. Today's BP is at goal -  136/84. Patient has had no complaints of any cardiac type chest pain, palpitations, dyspnea / orthopnea / PND, dizziness, claudication, or dependent edema.     Hyperlipidemia is controlled with diet & meds. Patient denies myalgias or other med SE's. Last Lipids were at goal albeit sl elevated Trig's: Lab Results  Component Value Date   CHOL 175 11/09/2017   HDL 64 11/09/2017   LDLCALC 84 11/09/2017   TRIG 168 (H) 11/09/2017   CHOLHDL 2.7 11/09/2017      Also, the patient has history of  PreDiabetes (A1c 5.8% / 2010) and has had no symptoms of reactive hypoglycemia, diabetic polys, paresthesias or visual blurring.  Last A1c was Normal & at goal: Lab Results  Component Value Date   HGBA1C 5.6 11/09/2017      Further, the patient also has history of Vitamin D Deficiency ("20" / 2008)  and supplements vitamin D without any suspected side-effects. Last vitamin D was at goal: Lab Results  Component Value Date   VD25OH 65 07/26/2017   Current Outpatient Medications on File Prior to Visit  Medication Sig  . baclofen (LIORESAL) 20 MG tablet Take 20 mg by mouth 3 (three) times daily as needed for muscle spasms.  . cholecalciferol 5000 units TABS Take 2 tablets daily  . FLUoxetine (PROZAC) 40 MG capsule TAKE ONE CAPSULE BY MOUTH TWICE DAILY (Patient taking differently: Take 40 mg by mouth daily. )  . ketotifen (ZADITOR) 0.025 % ophthalmic solution Place 1 drop into both eyes 3 (three) times daily as needed (allergies).  Marland Kitchen oxyCODONE  (ROXICODONE) 15 MG immediate release tablet Take 15 mg by mouth every 6 (six) hours as needed for pain.   . simvastatin (ZOCOR) 40 MG tablet Take 1 tablet at Bedtime   No current facility-administered medications on file prior to visit.    Allergies  Allergen Reactions  . Augmentin [Amoxicillin-Pot Clavulanate] Nausea And Vomiting  . Codeine Nausea And Vomiting  . Lipitor [Atorvastatin]     Leg pain  . Wellbutrin [Bupropion]     Pt does not recall reaction   . Zoloft [Sertraline Hcl]     Pt does not recall reaction    PMHx:   Past Medical History:  Diagnosis Date  . Allergy   . Arthritis   . AVN (avascular necrosis of bone) (HCC)    Left shoulder  . COPD (chronic obstructive pulmonary disease) ([place])   . Depression   . Diabetes mellitus without complication (HCC)    Pre-diabetes.   Marland Kitchen GERD (gastroesophageal reflux disease)   . Hyperlipidemia   . Hypertension   . Memory disorder 12/23/2016  . Vitamin D deficiency    Immunization History  Administered Date(s) Administered  . Td 01/19/2004, 06/05/2015   Past Surgical History:  Procedure Laterality Date  . ANKLE SURGERY Right   . BACK SURGERY    . BREAST EXCISIONAL BIOPSY Right   . CATARACT EXTRACTION Bilateral   . SPINE SURGERY  lumbar  . TONSILLECTOMY AND ADENOIDECTOMY     FHx:    Reviewed / unchanged  SHx:    Reviewed / unchanged   Systems Review:  Constitutional: Denies fever, chills, wt changes, headaches, insomnia, fatigue, night sweats, change in appetite. Eyes: Denies redness, blurred vision, diplopia, discharge, itchy, watery eyes.  ENT: Denies discharge, congestion, post nasal drip, epistaxis, sore throat, earache, hearing loss, dental pain, tinnitus, vertigo, sinus pain, snoring.  CV: Denies chest pain, palpitations, irregular heartbeat, syncope, dyspnea, diaphoresis, orthopnea, PND, claudication or edema. Respiratory: denies cough, dyspnea, DOE, pleurisy, hoarseness, laryngitis, wheezing.    Gastrointestinal: Denies dysphagia, odynophagia, heartburn, reflux, water brash, abdominal pain or cramps, nausea, vomiting, bloating, diarrhea, constipation, hematemesis, melena, hematochezia  or hemorrhoids. Genitourinary: Denies dysuria, frequency, urgency, nocturia, hesitancy, discharge, hematuria or flank pain. Musculoskeletal: Denies arthralgias, myalgias, stiffness, jt. swelling, pain, limping or strain/sprain.  Skin: Denies pruritus, rash, hives, warts, acne, eczema or change in skin lesion(s). Neuro: No weakness, tremor, incoordination, spasms, paresthesia or pain. Psychiatric: Denies confusion, memory loss or sensory loss. Endo: Denies change in weight, skin or hair change.  Heme/Lymph: No excessive bleeding, bruising or enlarged lymph nodes.  Physical Exam  BP 136/84   Pulse 76   Temp 97.6 F (36.4 C)   Resp 16   Ht 5\' 4"  (1.626 m)   Wt 159 lb (72.1 kg)   BMI 27.29 kg/m   Appears  well nourished, well groomed  and in no distress.  Eyes: PERRLA, EOMs, conjunctiva no swelling or erythema. Sinuses: No frontal/maxillary tenderness ENT/Mouth: EAC's clear, TM's nl w/o erythema, bulging. Nares clear w/o erythema, swelling, exudates. Oropharynx clear without erythema or exudates. Oral hygiene is good. Tongue normal, non obstructing. Hearing intact.  Neck: Supple. Thyroid not palpable. Car 2+/2+ without bruits, nodes or JVD. Chest: Respirations nl with BS clear & equal w/o rales, rhonchi, wheezing or stridor.  Cor: Heart sounds normal w/ regular rate and rhythm without sig. murmurs, gallops, clicks or rubs. Peripheral pulses normal and equal  without edema.  Abdomen: Soft & bowel sounds normal. Non-tender w/o guarding, rebound, hernias, masses or organomegaly.  Lymphatics: Unremarkable.  Musculoskeletal: Full ROM all peripheral extremities, joint stability, 5/5 strength and normal gait.  Skin: Warm, dry without exposed rashes, lesions or ecchymosis apparent.  Neuro: Cranial  nerves intact, reflexes equal bilaterally. Sensory-motor testing grossly intact. Tendon reflexes grossly intact.  Pysch: Alert & oriented x 3.  Insight and judgement nl & appropriate. No ideations.  Assessment and Plan:  1. Essential hypertension  - Continue medication, monitor blood pressure at home.  - Continue DASH diet.  Reminder to go to the ER if any CP,  SOB, nausea, dizziness, severe HA, changes vision/speech.  - CBC with Differential/Platelet - COMPLETE METABOLIC PANEL WITH GFR - Magnesium - TSH  2. Hyperlipidemia, mixed  - Continue diet/meds, exercise,& lifestyle modifications.  - Continue monitor periodic cholesterol/liver & renal functions   - Lipid panel - TSH  3. Abnormal glucose  - Continue diet, exercise,  - lifestyle modifications.  - Monitor appropriate labs.  - Hemoglobin A1c - Insulin, random  4. Vitamin D deficiency  - Continue supplementation.  - VITAMIN D 25 Hydroxyl  5. Prediabetes  - Hemoglobin A1c - Insulin, random  6. OSA and COPD overlap syndrome ([place])   7. Medication management  - CBC with Differential/Platelet - COMPLETE METABOLIC PANEL WITH GFR - Magnesium - Lipid panel - TSH - Hemoglobin A1c - Insulin, random - VITAMIN D 25 Hydroxyl  Discussed  regular exercise, BP monitoring, weight control to achieve/maintain BMI less than 25 and discussed med and SE's. Recommended labs to assess and monitor clinical status with further disposition pending results of labs. Over 30 minutes of exam, counseling, chart review was performed.

## 2018-02-13 NOTE — Patient Instructions (Signed)

## 2018-02-14 ENCOUNTER — Ambulatory Visit (INDEPENDENT_AMBULATORY_CARE_PROVIDER_SITE_OTHER): Payer: Medicare Other | Admitting: Internal Medicine

## 2018-02-14 VITALS — BP 136/84 | HR 76 | Temp 97.6°F | Resp 16 | Ht 64.0 in | Wt 159.0 lb

## 2018-02-14 DIAGNOSIS — R7309 Other abnormal glucose: Secondary | ICD-10-CM

## 2018-02-14 DIAGNOSIS — I10 Essential (primary) hypertension: Secondary | ICD-10-CM | POA: Diagnosis not present

## 2018-02-14 DIAGNOSIS — G4733 Obstructive sleep apnea (adult) (pediatric): Secondary | ICD-10-CM

## 2018-02-14 DIAGNOSIS — E559 Vitamin D deficiency, unspecified: Secondary | ICD-10-CM

## 2018-02-14 DIAGNOSIS — E782 Mixed hyperlipidemia: Secondary | ICD-10-CM | POA: Diagnosis not present

## 2018-02-14 DIAGNOSIS — R7303 Prediabetes: Secondary | ICD-10-CM | POA: Diagnosis not present

## 2018-02-14 DIAGNOSIS — Z79899 Other long term (current) drug therapy: Secondary | ICD-10-CM

## 2018-02-14 DIAGNOSIS — J449 Chronic obstructive pulmonary disease, unspecified: Secondary | ICD-10-CM

## 2018-02-15 ENCOUNTER — Encounter: Payer: Self-pay | Admitting: Internal Medicine

## 2018-02-15 DIAGNOSIS — R7309 Other abnormal glucose: Secondary | ICD-10-CM | POA: Insufficient documentation

## 2018-02-15 DIAGNOSIS — Z87891 Personal history of nicotine dependence: Secondary | ICD-10-CM | POA: Insufficient documentation

## 2018-02-15 DIAGNOSIS — R7303 Prediabetes: Secondary | ICD-10-CM | POA: Insufficient documentation

## 2018-02-16 DIAGNOSIS — M545 Low back pain: Secondary | ICD-10-CM | POA: Diagnosis not present

## 2018-02-16 DIAGNOSIS — G894 Chronic pain syndrome: Secondary | ICD-10-CM | POA: Diagnosis not present

## 2018-02-16 DIAGNOSIS — M4326 Fusion of spine, lumbar region: Secondary | ICD-10-CM | POA: Diagnosis not present

## 2018-02-16 DIAGNOSIS — M542 Cervicalgia: Secondary | ICD-10-CM | POA: Diagnosis not present

## 2018-02-16 LAB — VITAMIN D 25 HYDROXY (VIT D DEFICIENCY, FRACTURES): VIT D 25 HYDROXY: 61 ng/mL (ref 30–100)

## 2018-02-16 LAB — LIPID PANEL
Cholesterol: 158 mg/dL (ref <200)
HDL: 70 mg/dL (ref >50)
LDL CHOLESTEROL (CALC): 71 mg/dL
NON-HDL CHOLESTEROL (CALC): 88 mg/dL (ref <130)
TRIGLYCERIDES: 86 mg/dL (ref <150)
Total CHOL/HDL Ratio: 2.3 (calc) (ref <5.0)

## 2018-02-16 LAB — CBC WITH DIFFERENTIAL/PLATELET
Absolute Monocytes: 452 cells/uL (ref 200–950)
Basophils Absolute: 41 cells/uL (ref 0–200)
Basophils Relative: 0.7 %
EOS ABS: 162 {cells}/uL (ref 15–500)
EOS PCT: 2.8 %
HCT: 38.5 % (ref 35.0–45.0)
HEMOGLOBIN: 12.7 g/dL (ref 11.7–15.5)
Lymphs Abs: 1659 cells/uL (ref 850–3900)
MCH: 29.9 pg (ref 27.0–33.0)
MCHC: 33 g/dL (ref 32.0–36.0)
MCV: 90.6 fL (ref 80.0–100.0)
MONOS PCT: 7.8 %
MPV: 10.8 fL (ref 7.5–12.5)
NEUTROS ABS: 3486 {cells}/uL (ref 1500–7800)
Neutrophils Relative %: 60.1 %
PLATELETS: 288 10*3/uL (ref 140–400)
RBC: 4.25 10*6/uL (ref 3.80–5.10)
RDW: 12.5 % (ref 11.0–15.0)
TOTAL LYMPHOCYTE: 28.6 %
WBC: 5.8 10*3/uL (ref 3.8–10.8)

## 2018-02-16 LAB — COMPLETE METABOLIC PANEL WITH GFR
AG Ratio: 2 (calc) (ref 1.0–2.5)
ALBUMIN MSPROF: 4.3 g/dL (ref 3.6–5.1)
ALT: 13 U/L (ref 6–29)
AST: 12 U/L (ref 10–35)
Alkaline phosphatase (APISO): 101 U/L (ref 33–130)
BILIRUBIN TOTAL: 0.5 mg/dL (ref 0.2–1.2)
BUN: 15 mg/dL (ref 7–25)
CALCIUM: 9.6 mg/dL (ref 8.6–10.4)
CHLORIDE: 106 mmol/L (ref 98–110)
CO2: 29 mmol/L (ref 20–32)
CREATININE: 0.78 mg/dL (ref 0.50–0.99)
GFR, EST AFRICAN AMERICAN: 94 mL/min/{1.73_m2} (ref >=60)
GFR, EST NON AFRICAN AMERICAN: 81 mL/min/{1.73_m2} (ref >=60)
Globulin: 2.2 g/dL (calc) (ref 1.9–3.7)
Glucose, Bld: 107 mg/dL — ABNORMAL HIGH (ref 65–99)
Potassium: 3.6 mmol/L (ref 3.5–5.3)
Sodium: 141 mmol/L (ref 135–146)
Total Protein: 6.5 g/dL (ref 6.1–8.1)

## 2018-02-16 LAB — HEMOGLOBIN A1C
Hgb A1c MFr Bld: 5.8 % of total Hgb — ABNORMAL HIGH (ref <5.7)
Mean Plasma Glucose: 120 (calc)
eAG (mmol/L): 6.6 (calc)

## 2018-02-16 LAB — MAGNESIUM: Magnesium: 1.9 mg/dL (ref 1.5–2.5)

## 2018-02-16 LAB — INSULIN, RANDOM: INSULIN: 23.9 u[IU]/mL — AB (ref 2.0–19.6)

## 2018-02-16 LAB — TSH: TSH: 1.07 mIU/L (ref 0.40–4.50)

## 2018-03-03 DIAGNOSIS — H43812 Vitreous degeneration, left eye: Secondary | ICD-10-CM | POA: Diagnosis not present

## 2018-03-16 DIAGNOSIS — M4326 Fusion of spine, lumbar region: Secondary | ICD-10-CM | POA: Diagnosis not present

## 2018-03-16 DIAGNOSIS — M791 Myalgia, unspecified site: Secondary | ICD-10-CM | POA: Diagnosis not present

## 2018-03-16 DIAGNOSIS — M545 Low back pain: Secondary | ICD-10-CM | POA: Diagnosis not present

## 2018-04-01 ENCOUNTER — Other Ambulatory Visit: Payer: Self-pay | Admitting: Internal Medicine

## 2018-04-17 DIAGNOSIS — M545 Low back pain: Secondary | ICD-10-CM | POA: Diagnosis not present

## 2018-04-17 DIAGNOSIS — M791 Myalgia, unspecified site: Secondary | ICD-10-CM | POA: Diagnosis not present

## 2018-04-17 DIAGNOSIS — M4326 Fusion of spine, lumbar region: Secondary | ICD-10-CM | POA: Diagnosis not present

## 2018-05-01 ENCOUNTER — Ambulatory Visit: Payer: Self-pay | Admitting: Physician Assistant

## 2018-05-17 DIAGNOSIS — M545 Low back pain: Secondary | ICD-10-CM | POA: Diagnosis not present

## 2018-05-17 DIAGNOSIS — M791 Myalgia, unspecified site: Secondary | ICD-10-CM | POA: Diagnosis not present

## 2018-05-17 DIAGNOSIS — M4326 Fusion of spine, lumbar region: Secondary | ICD-10-CM | POA: Diagnosis not present

## 2018-05-19 NOTE — Progress Notes (Addendum)
MEDICARE ANNUAL WELLNESS VISIT AND FOLLOW UP Assessment:    DECLINES CCM   Encounter for Medicare annual wellness exam 1 year GET MGM  Essential hypertension - continue medications, DASH diet, exercise and monitor at home. Call if greater than 130/80.  -     CBC with Differential/Platelet -     BASIC METABOLIC PANEL WITH GFR -     Hepatic function panel -     TSH  Tortuous aorta (HCC) Control blood pressure, cholesterol, glucose, increase exercise.  Advised to stop smoking  Chronic obstructive pulmonary disease, unspecified COPD type ([place]) Advised to stop smoking, CXR, continue meds.   OSA and COPD overlap syndrome ([place]) Get back CPAP  Atherosclerosis of aorta (HCC) Control blood pressure, cholesterol, glucose, increase exercise.   CKD (chronic kidney disease) stage 2, GFR 60-89 ml/min -     BASIC METABOLIC PANEL WITH GFR  Depression, major, recurrent, in partial remission ([place]) - continue medications, stress management techniques discussed, increase water, good sleep hygiene discussed, increase exercise, and increase veggies.   Chronic pain syndrome Follow up ortho  Former Tobacco use disorder Still not smoking  Other abnormal glucose Discussed disease progression and risks Discussed diet/exercise, weight management and risk modification  Mixed hyperlipidemia -continue medications, check lipids, decrease fatty foods, increase activity.  -     Lipid panel  Vitamin D deficiency -     VITAMIN D 25 Hydroxy (Vit-D Deficiency, Fractures)  Medication management -     Magnesium  DDD, lumbar Continue ortho follow up  TIA Continue follow up with Dr. Jannifer Franklin  Over 30 minutes of exam, counseling, chart review, and critical decision making was performed  Future Appointments  Date Time Provider [place]  08/22/2018 11:00 AM Unk Pinto, MD [facility] None     Plan:   During the course of the visit the patient was educated and counseled about  appropriate screening and preventive services including:    Pneumococcal vaccine   Influenza vaccine  Prevnar 13  Td vaccine  Screening electrocardiogram  Colorectal cancer screening  Diabetes screening  Glaucoma screening  Nutrition counseling    Subjective:  Amber Hickman is a 63 y.o. female who presents for Medicare Annual Wellness Visit and 3 month follow up for HTN, hyperlipidemia, prediabetes, and vitamin D Def.   Her blood pressure has been controlled at home, today their BP is BP: 120/70 She does not workout. She denies chest pain, shortness of breath, dizziness. She has OSA and is not on CPAP, she is a smoker. She had a normal echo 04/2015, normal stress test 04/2015. Ct lung 07/14/2016.     BMI is Body mass index is 28.25 kg/m., she is working on diet and exercise. Wt Readings from Last 3 Encounters:  05/22/18 164 lb 9.6 oz (74.7 kg)  02/14/18 159 lb (72.1 kg)  11/09/17 152 lb (68.9 kg)  ' She sees pain management for her back pain.  They are prescribing her pain medications. Had back surgery with Dr. Patrice Paradise last April, still has some right leg issues and weakness.    She is still smoking, not ready to quit at this time. Has had frequent falls and some memory issues. She is following with Dr. Jannifer Franklin. She is tapered off topamax and gabapentin. She has been off ibuprofen due to [place], I have advised her she can continue it if needed. The data is not great and she can continue it.   She is on cholesterol medication and denies myalgias. Her cholesterol  is at goal. The cholesterol last visit was:   Lab Results  Component Value Date   CHOL 158 02/14/2018   HDL 70 02/14/2018   LDLCALC 71 02/14/2018   TRIG 86 02/14/2018   CHOLHDL 2.3 02/14/2018   :  Lab Results  Component Value Date   HGBA1C 5.8 (H) 02/14/2018   Last GFR Lab Results  Component Value Date   GFRNONAA 81 02/14/2018    Patient is on Vitamin D supplement.   Lab Results  Component  Value Date   VD25OH 61 02/14/2018      Medication Review: Current Outpatient Medications on File Prior to Visit  Medication Sig Dispense Refill  . baclofen (LIORESAL) 20 MG tablet Take 20 mg by mouth 3 (three) times daily as needed for muscle spasms.    . cholecalciferol 5000 units TABS Take 2 tablets daily    . FLUoxetine (PROZAC) 40 MG capsule Take 1 capsule Daily for Mood 90 capsule 3  . ketotifen (ZADITOR) 0.025 % ophthalmic solution Place 1 drop into both eyes 3 (three) times daily as needed (allergies).    Marland Kitchen oxyCODONE (ROXICODONE) 15 MG immediate release tablet Take 15 mg by mouth every 6 (six) hours as needed for pain.   0  . simvastatin (ZOCOR) 40 MG tablet Take 1 tablet at Bedtime 90 tablet 0   No current facility-administered medications on file prior to visit.     Allergies: Allergies  Allergen Reactions  . Augmentin [Amoxicillin-Pot Clavulanate] Nausea And Vomiting  . Codeine Nausea And Vomiting  . Lipitor [Atorvastatin]     Leg pain  . Wellbutrin [Bupropion]     Pt does not recall reaction   . Zoloft [Sertraline Hcl]     Pt does not recall reaction     Current Problems (verified) has Hyperlipidemia, mixed; Vitamin D deficiency; COPD (chronic obstructive pulmonary disease) ([place]); Medication management; CKD (chronic kidney disease) stage 2, GFR 60-89 ml/min; DDD, lumbar; Chronic pain syndrome; HTN (hypertension); Depression, major, recurrent, in partial remission ([place]); OSA and COPD overlap syndrome ([place]); Aortic atherosclerosis ([place]); TIA (transient ischemic attack); Abnormal glucose; and Former smoker on their problem list.  Screening Tests Immunization History  Administered Date(s) Administered  . Td 01/19/2004, 06/05/2015   Preventative care: Last colonoscopy: 09/2017 Mammogram 05/2017, will be delayed due to [place] Echo 2017 Stress test 2017 Ct lung 08/2017 PAP 2011 declines CTA head and neck 09/2017- normal  Prior vaccinations: TD or Tdap  2017  Influenza: Declined  Pneumococcal: Due, but declined Prevnar13: Declined Shingles/Zostavax: Declined  Names of Other Physician/Practitioners you currently use: 1. [facility] here for primary care 2. Dr. Katy Fitch, eye doctor, last visit 2020 3. Dr.Sarfan , dentist, last visit Jan 2020 Patient Care Team: Unk Pinto, MD as PCP - General (Internal Medicine) Richmond Campbell, MD as Consulting Physician (Gastroenterology) Vicie Mutters, PA-C as Referring Physician (Physician Assistant)  Surgical: She  has a past surgical history that includes Cataract extraction (Bilateral); Spine surgery; Ankle surgery (Right); Tonsillectomy and adenoidectomy; Breast excisional biopsy (Right); and Back surgery. Family Her family history includes COPD in her mother; Cancer in her mother; Heart disease in her father; Hypertension in her mother; Stroke in her mother. Social history  She reports that she quit smoking about a year ago. She has a 30.00 pack-year smoking history. She has never used smokeless tobacco. She reports that she does not drink alcohol or use drugs.  MEDICARE WELLNESS OBJECTIVES: Physical activity: Current Exercise Habits: The patient does not  participate in regular exercise at present, Exercise limited by: orthopedic condition(s) Cardiac risk factors: Cardiac Risk Factors include: advanced age (>64men, >21 women);dyslipidemia;hypertension;sedentary lifestyle Depression/mood screen:   Depression screen [facility] 2/9 05/22/2018  Decreased Interest 1  Down, Depressed, Hopeless 1  PHQ - 2 Score 2  Altered sleeping 1  Tired, decreased energy 1  Change in appetite 0  Feeling bad or failure about yourself  0  Trouble concentrating 1  Moving slowly or fidgety/restless 0  Suicidal thoughts 0  PHQ-9 Score 5  Difficult doing work/chores Somewhat difficult    ADLs:  In your present state of health, do you have any difficulty performing the following  activities: 05/22/2018 02/15/2018  Hearing? N N  Vision? N N  Difficulty concentrating or making decisions? N N  Comment improved off of medications -  Walking or climbing stairs? N N  Dressing or bathing? N N  Doing errands, shopping? N N  Some recent data might be hidden     Cognitive Testing  Alert? Yes  Normal Appearance?Yes  Oriented to person? Yes  Place? Yes   Time? Yes  Recall of three objects?  Yes  Can perform simple calculations? Yes  Displays appropriate judgment?Yes  Can read the correct time from a watch face?Yes  EOL planning: Does Patient Have a Medical Advance Directive?: Yes Type of Advance Directive: Healthcare Power of Attorney, Living will Copy of [place] in Chart?: No - copy requested   Objective:   Today's Vitals   05/22/18 1106  BP: 120/70  Pulse: 89  Temp: 97.6 F (36.4 C)  SpO2: 99%  Weight: 164 lb 9.6 oz (74.7 kg)  Height: 5\' 4"  (1.626 m)   Body mass index is 28.25 kg/m.  General appearance: alert, no distress, WD/WN, female HEENT: normocephalic, sclerae anicteric, TMs pearly, nares patent, no discharge or erythema, pharynx normal Oral cavity: MMM, no lesions Neck: supple, no lymphadenopathy, no thyromegaly, no masses Heart: RRR, normal S1, S2, no murmurs Lungs: CTA bilaterally, no wheezes, rhonchi, or rales Abdomen: +bs, soft, non tender, non distended, no masses, no hepatomegaly, no splenomegaly Musculoskeletal: nontender, no swelling, no obvious deformity Extremities: no edema, no cyanosis, no clubbing, right leg with some atrophy and decrease muscle compared to left.  Pulses: 2+ symmetric, upper and lower extremities, normal cap refill Neurological: alert, oriented x 3, CN2-12 intact, strength normal upper extremities and lower extremities, sensation normal throughout, DTRs 2+ throughout, no cerebellar signs, gait normal Psychiatric: normal affect, behavior normal, pleasant   Medicare Attestation I have personally  reviewed: The patient's medical and social history Their use of alcohol, tobacco or illicit drugs Their current medications and supplements The patient's functional ability including ADLs,fall risks, home safety risks, cognitive, and hearing and visual impairment Diet and physical activities Evidence for depression or mood disorders  The patient's weight, height, BMI, and visual acuity have been recorded in the chart.  I have made referrals, counseling, and provided education to the patient based on review of the above and I have provided the patient with a written personalized care plan for preventive services.     Vicie Mutters, PA-C   05/22/2018

## 2018-05-22 ENCOUNTER — Other Ambulatory Visit: Payer: Self-pay

## 2018-05-22 ENCOUNTER — Encounter: Payer: Self-pay | Admitting: Physician Assistant

## 2018-05-22 ENCOUNTER — Ambulatory Visit (INDEPENDENT_AMBULATORY_CARE_PROVIDER_SITE_OTHER): Payer: Medicare Other | Admitting: Physician Assistant

## 2018-05-22 VITALS — BP 120/70 | HR 89 | Temp 97.6°F | Ht 64.0 in | Wt 164.6 lb

## 2018-05-22 DIAGNOSIS — G459 Transient cerebral ischemic attack, unspecified: Secondary | ICD-10-CM

## 2018-05-22 DIAGNOSIS — E782 Mixed hyperlipidemia: Secondary | ICD-10-CM

## 2018-05-22 DIAGNOSIS — Z0001 Encounter for general adult medical examination with abnormal findings: Secondary | ICD-10-CM | POA: Diagnosis not present

## 2018-05-22 DIAGNOSIS — G894 Chronic pain syndrome: Secondary | ICD-10-CM

## 2018-05-22 DIAGNOSIS — J449 Chronic obstructive pulmonary disease, unspecified: Secondary | ICD-10-CM | POA: Diagnosis not present

## 2018-05-22 DIAGNOSIS — F3341 Major depressive disorder, recurrent, in partial remission: Secondary | ICD-10-CM

## 2018-05-22 DIAGNOSIS — I700 Atherosclerosis of aorta: Secondary | ICD-10-CM

## 2018-05-22 DIAGNOSIS — I10 Essential (primary) hypertension: Secondary | ICD-10-CM

## 2018-05-22 DIAGNOSIS — R7309 Other abnormal glucose: Secondary | ICD-10-CM | POA: Diagnosis not present

## 2018-05-22 DIAGNOSIS — G4733 Obstructive sleep apnea (adult) (pediatric): Secondary | ICD-10-CM

## 2018-05-22 DIAGNOSIS — I771 Stricture of artery: Secondary | ICD-10-CM

## 2018-05-22 DIAGNOSIS — M5136 Other intervertebral disc degeneration, lumbar region: Secondary | ICD-10-CM

## 2018-05-22 DIAGNOSIS — R6889 Other general symptoms and signs: Secondary | ICD-10-CM | POA: Diagnosis not present

## 2018-05-22 DIAGNOSIS — Z87891 Personal history of nicotine dependence: Secondary | ICD-10-CM

## 2018-05-22 DIAGNOSIS — E559 Vitamin D deficiency, unspecified: Secondary | ICD-10-CM | POA: Diagnosis not present

## 2018-05-22 DIAGNOSIS — N182 Chronic kidney disease, stage 2 (mild): Secondary | ICD-10-CM

## 2018-05-22 DIAGNOSIS — Z0000 Encounter for general adult medical examination without abnormal findings: Secondary | ICD-10-CM

## 2018-05-22 DIAGNOSIS — Z79899 Other long term (current) drug therapy: Secondary | ICD-10-CM

## 2018-05-22 NOTE — Patient Instructions (Addendum)
MAXIMUM AMOUNT OF TYLENOL IN A DAY  You can take tylenol (500mg ) or tylenol arthritis (650mg ) with the meloxicam/antiinflammatories. The max you can take of tylenol a day is 3000mg  daily, this is a max of 6 pills a day of the regular tyelnol (500mg ) or a max of 4 a day of the tylenol arthritis (650mg ) as long as no other medications you are taking contain tylenol.   Can do a steroid nasal spary 1-2 sparys at night each nostril.  Remember to spray each nostril twice towards the outer part of your eye.   Do not sniff but instead pinch your nose and tilt your head back to help the medicine get into your sinuses.   The best time to do this is at bedtime.  Stop if you get blurred vision or nose bleeds.   THIS WILL TAKE 7 DAYS TO WORK AND IS BETTER IF YOU START BEFORE SYMPTOMS SO IF YOU HAVE A SEASON OR TIME OF THE YEAR YOU ALWAYS GET A COLD, START BEFORE THAT!   ALLERGY MEDICATIONS OVER THE COUNTER  Please pick one of the over the counter allergy medications below and take it once daily for allergies.  Claritin or loratadine cheapest but likely the weakest  Zyrtec or certizine at night because it can make you sleepy The strongest is allegra or fexafinadine  Cheapest at [employer], [employer], [employer]   Please go to the ER if you have any severe AB pain, unable to hold down food/water, blood in stool or vomit, chest pain, shortness of breath, or any worsening symptoms.   Consider keeping a food diary- common causes of diarrhea are dairy, certain carbs...  FODMAP stands for fermentable oligo-, di-, mono-saccharides and polyols (1). These are the scientific terms used to classify groups of carbs that are notorious for triggering digestive symptoms like bloating, gas and stomach pain.   FODMAPs are found in a wide range of foods in varying amounts. Some foods contain just one type, while others contain several.  The main dietary sources of the four groups of FODMAPs include:  Oligosaccharides: Wheat,  rye, legumes and various fruits and vegetables, such as garlic and onions.  Disaccharides: Milk, yogurt and soft cheese. Lactose is the main carb.  Monosaccharides: Various fruit including figs and mangoes, and sweeteners such as honey and agave nectar. Fructose is the main carb.  Polyols: Certain fruits and vegetables including blackberries and lychee, as well as some low-calorie sweeteners like those in sugar-free gum.   Keep a food diary. This will help you identify foods that cause symptoms. Write down: ? What you eat and when. ? What symptoms you have. ? When symptoms occur in relation to your meals.  Avoid foods that cause symptoms. Talk with your dietitian about other ways to get the same nutrients that are in these foods.  Eat your meals slowly, in a relaxed setting.  Aim to eat 5-6 small meals per day. Do not skip meals.  Drink enough fluids to keep your urine clear or pale yellow.  Ask your health care provider if you should take an over-the-counter probiotic during flare-ups to help restore healthy gut bacteria.  If you have cramping or diarrhea, try making your meals low in fat and high in carbohydrates. Examples of carbohydrates are pasta, rice, whole grain breads and cereals, fruits, and vegetables.  If dairy products cause your symptoms to flare up, try eating less of them. You might be able to handle yogurt better than other dairy products because  it contains bacteria that help with digestion.     AN UPDATE FROM [facility] Remember information is changing on an hourly basis, we are doing our best to stay up to date on the information.  If you have a question or concern please contact our office.   Daily Quarantine Questions to lift the spirit and help the body... - Who am I checking on or connecting with today? - What expectations of "normal" am I letting go of today? - How am I getting outside today? - How am I  expressing my creativity today? - How am I moving my body today? - What type of self-care am I practicing today? - What am I grateful for today?  What are the symptoms? There are many different symptoms reported such as diarrhea, loss of smell, muscle aches HOWEVER the most common associated with the virus at this time is FEVER, DRY COUGH, and SHORTNESS OF BREATH.   What is shortness of breath that is concerning? - If you are unable to talk in full sentences - if you are unable to take a full breath (not to deep) and hold for 20-25 seconds.  - If you are panting while walking.  Some shortness of breath is common with this virus. Please do not panic. The best thing to do with any symptoms is to call the office. We can see you on video or talk with you on the phone to best determine if you need to go to the ER or get tested.   What to do if you have symptoms or you feel that you have come in contact with someone that may be infected with coronavirus? Please stay at home.  Call our office or send a MyChart message.  Please remember the majority of cases are self-limited and do not require in hospital intervention.  There is no specific treatment for a mild case of the virus other than supportive care.  Please only go to the ER if your symptoms escalate such as worsening shortness of breath, chest pain, and confusion.   As we mentioned before we are continually getting new information and there is data showing that even if you do not have symptoms you may be able to spread the virus. Therefore, the best thing to do is to stay home! Please limit your contact with people, be mindful of social distancing.   At this time we are STILL NOT accepting walk in appointments or sick visits.  We are still having asymptomatic patients come into the office however we are asking that you call the office from your car to notify that you are here and we can check you into your appointment via the telephone.  We  will have a nurse call you when she is ready for you to come into the office, this way we will not have patients waiting in the waiting room and can be more efficient.   Virtual visits and Evisits are in full swing and so far the patients are really enjoying them For your convenience and to prevent transmission, we are now performing Evisits or telephone encounters for virtual visits for sick visits and regular scheduled follow up visits.  You can contact one of your providers through [place], your patient portal at any time or call the office during business hours.   Medicare and insurances are covering this during this crisis.  This helps you because we are able to talk about your conditions, discuss things  to monitor and do; in addition, it also helps [place] as a practice. We are a small private practice and need our patients help to remain open by doing these visits.  So please consider doing a virtual visit rather than rescheduling completely!  PLEASE DO NOT CLICK THE EVISIT BUTTON ON MY CHART, INSTEAD CHOOSE TO MESSAGE YOUR PROVIDER AND SEND [place] A MESSAGE WITH YOUR ISSUES.   As a community we are no longer testing patients with mild symptoms.  If the symptoms are mild, we are NOT testing patients to conserve supplies and capacity so our health care workers can care for people who need medical attention even during the peak of the outbreak.  Patients with mild symptoms are being instructed to stay at home and recover for 2 weeks.  You can stay in contact with [place] during that time by mychart, telephone calls or we are starting virtual office visits with video capability.   Mild symptoms include cough,fever WITHOUT any of the following symptoms: Difficulty breathing, chest discomfort, altered thinking and confusion.  Please notify us immediately if you have these symptoms.   We have also decided NOT to do testing in our office for coronavirus at this time though we are continually accessing the  situation as needed and will notify you if this changes.    If after a mychart communication or telephone visit, we feel you need testing then we will put in an order for you to get tested at a specimen collection site through [facility].   We will not test you if you do not have symptoms or have not had potential contact at this time.  We will not test you if you just show up to the office, you need an appointment specifically for testing, please call or message first.   Please remember that this virus is happening at the same time as other respiratory viruses, such as influenza, common colds and now allergy season.   To reduce your risk of infection we recommend the same precautions as those used to avoid the common cold and flu virus  . Please wash your hands with soap and water for 20 seconds and frequently. Wendee Copp your hands before you eat or touch your face.  . Avoid touching your face, eyes, nose, and mouth as much as possible.  . if needing to cough or sneeze cover your mouth and nose by coughing or sneezing into your elbow area, your sleeve or a tissue . Routinely clean frequently touched items such as doorknobs, keyboards, and phones.  . Avoid crowds of people.  Marland Kitchen avoid shaking hands with others; . We recommend anyone within the high-risk demographics such as older adults and those with heart/lung disease, auto-immune or immune-suppressing health conditions to consider reduced travel and public outings.  Please refer to the sources below for current updates, guidelines and recommendations.  You can call this hotline set up by [facility] hospital 1 [phone] 40COVID [phone])  You can visit these websites: CDC.gov WHO.int

## 2018-05-23 ENCOUNTER — Other Ambulatory Visit: Payer: Self-pay | Admitting: Physician Assistant

## 2018-05-23 LAB — CBC WITH DIFFERENTIAL/PLATELET
Absolute Monocytes: 389 cells/uL (ref 200–950)
Basophils Absolute: 30 cells/uL (ref 0–200)
Basophils Relative: 0.5 %
Eosinophils Absolute: 71 cells/uL (ref 15–500)
Eosinophils Relative: 1.2 %
HCT: 43.3 % (ref 35.0–45.0)
Hemoglobin: 14.4 g/dL (ref 11.7–15.5)
Lymphs Abs: 2207 cells/uL (ref 850–3900)
MCH: 30.3 pg (ref 27.0–33.0)
MCHC: 33.3 g/dL (ref 32.0–36.0)
MCV: 91 fL (ref 80.0–100.0)
MPV: 10.7 fL (ref 7.5–12.5)
Monocytes Relative: 6.6 %
Neutro Abs: 3204 cells/uL (ref 1500–7800)
Neutrophils Relative %: 54.3 %
Platelets: 304 10*3/uL (ref 140–400)
RBC: 4.76 10*6/uL (ref 3.80–5.10)
RDW: 12.5 % (ref 11.0–15.0)
Total Lymphocyte: 37.4 %
WBC: 5.9 10*3/uL (ref 3.8–10.8)

## 2018-05-23 LAB — COMPLETE METABOLIC PANEL WITH GFR
AG Ratio: 1.8 (calc) (ref 1.0–2.5)
ALT: 20 U/L (ref 6–29)
AST: 20 U/L (ref 10–35)
Albumin: 4.6 g/dL (ref 3.6–5.1)
Alkaline phosphatase (APISO): 99 U/L (ref 37–153)
BUN/Creatinine Ratio: 15 (calc) (ref 6–22)
BUN: 16 mg/dL (ref 7–25)
CO2: 25 mmol/L (ref 20–32)
Calcium: 10 mg/dL (ref 8.6–10.4)
Chloride: 105 mmol/L (ref 98–110)
Creat: 1.1 mg/dL — ABNORMAL HIGH (ref 0.50–0.99)
GFR, Est African American: 62 mL/min/{1.73_m2} (ref >=60)
GFR, Est Non African American: 53 mL/min/{1.73_m2} — ABNORMAL LOW (ref >=60)
Globulin: 2.6 g/dL (calc) (ref 1.9–3.7)
Glucose, Bld: 88 mg/dL (ref 65–99)
Potassium: 4.2 mmol/L (ref 3.5–5.3)
Sodium: 140 mmol/L (ref 135–146)
Total Bilirubin: 0.5 mg/dL (ref 0.2–1.2)
Total Protein: 7.2 g/dL (ref 6.1–8.1)

## 2018-05-23 LAB — LIPID PANEL
Cholesterol: 205 mg/dL — ABNORMAL HIGH (ref <200)
HDL: 68 mg/dL (ref >=50)
LDL Cholesterol (Calc): 112 mg/dL (calc) — ABNORMAL HIGH
Non-HDL Cholesterol (Calc): 137 mg/dL (calc) — ABNORMAL HIGH (ref <130)
Total CHOL/HDL Ratio: 3 (calc) (ref <5.0)
Triglycerides: 130 mg/dL (ref <150)

## 2018-05-23 LAB — HEMOGLOBIN A1C
Hgb A1c MFr Bld: 5.6 % of total Hgb (ref <5.7)
Mean Plasma Glucose: 114 (calc)
eAG (mmol/L): 6.3 (calc)

## 2018-05-23 LAB — VITAMIN D 25 HYDROXY (VIT D DEFICIENCY, FRACTURES): Vit D, 25-Hydroxy: 54 ng/mL (ref 30–100)

## 2018-05-23 LAB — MAGNESIUM: Magnesium: 2.1 mg/dL (ref 1.5–2.5)

## 2018-05-23 LAB — TSH: TSH: 4 mIU/L (ref 0.40–4.50)

## 2018-05-23 MED ORDER — ACETAMINOPHEN 500 MG PO TABS: 1000.0000 mg | ORAL_TABLET | Freq: Three times a day (TID) | ORAL | 2 refills | Status: DC | PRN

## 2018-05-23 NOTE — Addendum Note (Signed)
Addended by: Vicie Mutters R on: 05/23/2018 11:15 AM   Modules accepted: Orders

## 2018-06-14 DIAGNOSIS — M4326 Fusion of spine, lumbar region: Secondary | ICD-10-CM | POA: Diagnosis not present

## 2018-06-14 DIAGNOSIS — M545 Low back pain: Secondary | ICD-10-CM | POA: Diagnosis not present

## 2018-06-14 DIAGNOSIS — G894 Chronic pain syndrome: Secondary | ICD-10-CM | POA: Diagnosis not present

## 2018-06-14 DIAGNOSIS — M791 Myalgia, unspecified site: Secondary | ICD-10-CM | POA: Diagnosis not present

## 2018-06-14 DIAGNOSIS — Z79891 Long term (current) use of opiate analgesic: Secondary | ICD-10-CM | POA: Diagnosis not present

## 2018-06-14 DIAGNOSIS — Z79899 Other long term (current) drug therapy: Secondary | ICD-10-CM | POA: Diagnosis not present

## 2018-06-21 ENCOUNTER — Other Ambulatory Visit: Payer: Medicare Other

## 2018-06-22 ENCOUNTER — Other Ambulatory Visit: Payer: Self-pay

## 2018-06-22 ENCOUNTER — Other Ambulatory Visit: Payer: Medicare Other

## 2018-06-22 DIAGNOSIS — I10 Essential (primary) hypertension: Secondary | ICD-10-CM | POA: Diagnosis not present

## 2018-06-23 LAB — BASIC METABOLIC PANEL WITH GFR
BUN: 11 mg/dL (ref 7–25)
CO2: 27 mmol/L (ref 20–32)
Calcium: 9.6 mg/dL (ref 8.6–10.4)
Chloride: 105 mmol/L (ref 98–110)
Creat: 0.87 mg/dL (ref 0.50–0.99)
GFR, Est African American: 82 mL/min/{1.73_m2} (ref >=60)
GFR, Est Non African American: 71 mL/min/{1.73_m2} (ref >=60)
Glucose, Bld: 85 mg/dL (ref 65–99)
Potassium: 4.4 mmol/L (ref 3.5–5.3)
Sodium: 139 mmol/L (ref 135–146)

## 2018-06-29 ENCOUNTER — Other Ambulatory Visit: Payer: Self-pay | Admitting: Internal Medicine

## 2018-07-13 DIAGNOSIS — M25559 Pain in unspecified hip: Secondary | ICD-10-CM | POA: Diagnosis not present

## 2018-07-13 DIAGNOSIS — M4326 Fusion of spine, lumbar region: Secondary | ICD-10-CM | POA: Diagnosis not present

## 2018-07-13 DIAGNOSIS — M545 Low back pain: Secondary | ICD-10-CM | POA: Diagnosis not present

## 2018-07-13 DIAGNOSIS — M791 Myalgia, unspecified site: Secondary | ICD-10-CM | POA: Diagnosis not present

## 2018-07-24 ENCOUNTER — Other Ambulatory Visit: Payer: Self-pay | Admitting: Internal Medicine

## 2018-07-24 DIAGNOSIS — Z1231 Encounter for screening mammogram for malignant neoplasm of breast: Secondary | ICD-10-CM

## 2018-08-10 ENCOUNTER — Encounter: Payer: Self-pay | Admitting: Internal Medicine

## 2018-08-11 DIAGNOSIS — M791 Myalgia, unspecified site: Secondary | ICD-10-CM | POA: Diagnosis not present

## 2018-08-11 DIAGNOSIS — M545 Low back pain: Secondary | ICD-10-CM | POA: Diagnosis not present

## 2018-08-11 DIAGNOSIS — M4326 Fusion of spine, lumbar region: Secondary | ICD-10-CM | POA: Diagnosis not present

## 2018-08-21 ENCOUNTER — Encounter: Payer: Self-pay | Admitting: Internal Medicine

## 2018-08-21 NOTE — Progress Notes (Signed)
[facility] Unk Pinto, M.D.     Uvaldo Bristle. Silverio Lay, P.A.-C Liane Comber, DNP _______________________________________________ [facility] [address] [place], [place] [address] Telephone [phone] Telefax [phone] Annual Screening/Preventative Visit & Comprehensive Evaluation &  Examination     This very nice 63 y.o. single WF presents for a Screening /Preventative Visit & comprehensive evaluation and management of multiple medical co-morbidities.  Patient has been followed for HTN, HLD, Prediabetes  and Vitamin D Deficiency. Patient also has COPR and is on CPAP for OSA with reported improved restorative sleep.      The patient is on SS Disabilityforchronic pain consequent ofLumbar DDDs/p surg x 2 in 2004and and is followed by Dr Ace Gins for pain management.                                         Patient has COPD from over 76 pk years smoking history. Last year she underwent LD Screening  & we discussed lung cancer screening again.She  is agreeable to undergo a screening low dose CT scan of the chest. We discussed smoking cessation techniques/options. I will refer her for a LDCT lung scan & we discussedlung cancer screening,  counseling and shared decision making. She alleges stopping smoking on 05/10/2017.       HTN predates since 1998. Patient's BP has been controlled at home and patient denies any cardiac symptoms as chest pain, palpitations, shortness of breath, dizziness or ankle swelling. Today's BP is sl elevated diastolic -  657/84.      Patient's hyperlipidemia is not controlled with diet and Simvastatin. Patient denies myalgias or other medication SE's. Last lipids were not at goal: Lab Results  Component Value Date   CHOL 205 (H) 05/22/2018   HDL 68 05/22/2018   LDLCALC 112 (H) 05/22/2018   TRIG 130 05/22/2018   CHOLHDL 3.0 05/22/2018      Patient has hx/o prediabetes predating (A1c  5.8% / 2010)  and patient denies reactive hypoglycemic symptoms, visual blurring, diabetic polys or paresthesias. Last A1c was Normal & at goal: Lab Results  Component Value Date   HGBA1C 5.6 05/22/2018      Finally, patient has history of Vitamin D Deficiency ("20" / 2008) last Vitamin D was near goal (70-100):  Lab Results  Component Value Date   VD25OH 54 05/22/2018   Current Outpatient Medications on File Prior to Visit  Medication Sig  . acetaminophen (TYLENOL) 500 MG tablet Take 2 tablets (1,000 mg total) by mouth 3 (three) times daily as needed for moderate pain.  . baclofen (LIORESAL) 20 MG tablet Take 20 mg by mouth 3 (three) times daily as needed for muscle spasms.  . cholecalciferol 5000 units TABS Take 2 tablets daily  . FLUoxetine (PROZAC) 40 MG capsule Take 1 capsule Daily for Mood  . ketotifen (ZADITOR) 0.025 % ophthalmic solution Place 1 drop into both eyes 3 (three) times daily as needed (allergies).  Marland Kitchen oxyCODONE (ROXICODONE) 15 MG immediate release tablet Take 15 mg by mouth every 6 (six) hours as needed for pain.   . simvastatin (ZOCOR) 40 MG tablet TAKE 1 TABLET BY MOUTH AT BEDTIME   No current facility-administered medications on file prior to visit.    Allergies  Allergen Reactions  . Augmentin [Amoxicillin-Pot Clavulanate] Nausea And Vomiting  . Codeine Nausea And Vomiting  . Lipitor [Atorvastatin]  Leg pain  . Wellbutrin [Bupropion]     Pt does not recall reaction   . Zoloft [Sertraline Hcl]     Pt does not recall reaction    Past Medical History:  Diagnosis Date  . Allergy   . Arthritis   . AVN (avascular necrosis of bone) (HCC)    Left shoulder  . COPD (chronic obstructive pulmonary disease) ([place])   . Depression   . Diabetes mellitus without complication (HCC)    Pre-diabetes.   Marland Kitchen GERD (gastroesophageal reflux disease)   . Hyperlipidemia   . Hypertension   . Memory disorder 12/23/2016  . Vitamin D deficiency    Health Maintenance  Topic  Date Due  . Hepatitis C Screening  [birth year]  . PAP SMEAR-Modifier  01/18/2013  . URINE MICROALBUMIN  07/27/2018  . MAMMOGRAM  05/17/2019  . TETANUS/TDAP  06/04/2025  . COLONOSCOPY  09/23/2027  . HIV Screening  Completed  . INFLUENZA VACCINE  Discontinued   Immunization History  Administered Date(s) Administered  . Td 01/19/2004, 06/05/2015   Last Colon - 09/22/2017 - Dr Earlean Shawl and recc 10 yr f/u   Last MGM - 05/17/2017 - Overdue  Past Surgical History:  Procedure Laterality Date  . ANKLE SURGERY Right   . BACK SURGERY    . BREAST EXCISIONAL BIOPSY Right   . CATARACT EXTRACTION Bilateral   . SPINE SURGERY     lumbar  . TONSILLECTOMY AND ADENOIDECTOMY     Family History  Problem Relation Age of Onset  . Hypertension Mother   . COPD Mother   . Stroke Mother   . Cancer Mother        breast  . Heart disease Father        Age 14, MI  . Breast cancer Neg Hx    Social History   Tobacco Use  . Smoking status: Former Smoker    Packs/day: 1.00    Years: 30.00    Pack years: 30.00    Quit date: 05/10/2017    Years since quitting: 1.2  . Smokeless tobacco: Never Used  Substance Use Topics  . Alcohol use: No  . Drug use: No    ROS Constitutional: Denies fever, chills, weight loss/gain, headaches, insomnia,  night sweats, and change in appetite. Does c/o fatigue. Eyes: Denies redness, blurred vision, diplopia, discharge, itchy, watery eyes.  ENT: Denies discharge, congestion, post nasal drip, epistaxis, sore throat, earache, hearing loss, dental pain, Tinnitus, Vertigo, Sinus pain, snoring.  Cardio: Denies chest pain, palpitations, irregular heartbeat, syncope, dyspnea, diaphoresis, orthopnea, PND, claudication, edema Respiratory: denies cough, dyspnea, DOE, pleurisy, hoarseness, laryngitis, wheezing.  Gastrointestinal: Denies dysphagia, heartburn, reflux, water brash, pain, cramps, nausea, vomiting, bloating, diarrhea, constipation, hematemesis, melena, hematochezia,  jaundice, hemorrhoids Genitourinary: Denies dysuria, frequency, urgency, nocturia, hesitancy, discharge, hematuria, flank pain Breast: Breast lumps, nipple discharge, bleeding.  Musculoskeletal: Denies arthralgia, myalgia, stiffness, Jt. Swelling, pain, limp, and strain/sprain. Denies falls. Skin: Denies puritis, rash, hives, warts, acne, eczema, changing in skin lesion Neuro: No weakness, tremor, incoordination, spasms, paresthesia, pain Psychiatric: Denies confusion, memory loss, sensory loss. Denies Depression. Endocrine: Denies change in weight, skin, hair change, nocturia, and paresthesia, diabetic polys, visual blurring, hyper / hypo glycemic episodes.  Heme/Lymph: No excessive bleeding, bruising, enlarged lymph nodes.  Physical Exam  BP 126/88   Pulse 80   Temp (!) 97 F (36.1 C)   Resp 16   Ht 5\' 4"  (1.626 m)   Wt 157 lb 12.8 oz (71.6 kg)   BMI  27.09 kg/m   General Appearance: Over nourished, well groomed and in no apparent distress.  Eyes: PERRLA, EOMs, conjunctiva no swelling or erythema, normal fundi and vessels. Sinuses: No frontal/maxillary tenderness ENT/Mouth: EACs patent / TMs  nl. Nares clear without erythema, swelling, mucoid exudates. Oral hygiene is good. No erythema, swelling, or exudate. Tongue normal, non-obstructing. Tonsils not swollen or erythematous. Hearing normal.  Neck: Supple, thyroid not palpable. No bruits, nodes or JVD. Respiratory: Respiratory effort normal.  BS equal and clear bilateral without rales, rhonci, wheezing or stridor. Cardio: Heart sounds are normal with regular rate and rhythm and no murmurs, rubs or gallops. Peripheral pulses are normal and equal bilaterally without edema. No aortic or femoral bruits. Chest: symmetric with normal excursions and percussion. Breasts: Symmetric, without lumps, nipple discharge, retractions, or fibrocystic changes.  Abdomen: Flat, soft with bowel sounds active. Nontender, no guarding, rebound, hernias,  masses, or organomegaly.  Lymphatics: Non tender without lymphadenopathy.  Musculoskeletal: Full ROM all peripheral extremities, joint stability, 5/5 strength, and normal gait. Skin: Warm and dry without rashes, lesions, cyanosis, clubbing or  ecchymosis.  Neuro: Cranial nerves intact, reflexes equal bilaterally. Normal muscle tone, no cerebellar symptoms. Sensation intact.  Pysch: Alert and oriented X 3, normal affect, Insight and Judgment appropriate.   Assessment and Plan  1. Annual Preventative Screening Examination  2. Essential hypertension  - EKG 12-Lead - Urinalysis, Routine w reflex microscopic - Microalbumin / creatinine urine ratio - CBC with Differential/Platelet - COMPLETE METABOLIC PANEL WITH GFR - Magnesium - TSH  3. Hyperlipidemia, mixed  - EKG 12-Lead - Lipid panel - TSH  4. Abnormal glucose  - EKG 12-Lead - Hemoglobin A1c - Insulin, random  5. Vitamin D deficiency  - CBC with Differential/Platelet - COMPLETE METABOLIC PANEL WITH GFR - Magnesium - Lipid panel - TSH - Hemoglobin A1c - Insulin, random - VITAMIN D 25 Hydroxy  6. DDD, lumbar  7. Chronic obstructive pulmonary disease  ([place])  8. OSA and COPD overlap syndrome ([place])  9. Encounter for screening for lung cancer  - CT CHEST LUNG CA SCREEN LOW DOSE W/O CM; Future  10. Tobacco use disorder  - CT CHEST LUNG CA SCREEN LOW DOSE W/O CM; Future  11. Screening for ischemic heart disease  - EKG 12-Lead  12. FHx: heart disease  - EKG 12-Lead  13. Former smoker  71. Medication management  - Urinalysis, Routine w reflex microscopic - Microalbumin / creatinine urine ratio  15. Encounter for screening for malignant neoplasm of respiratory organs  - CT CHEST LUNG CA SCREEN LOW DOSE W/O CM; Future - CBC with Differential/Platelet - COMPLETE METABOLIC PANEL WITH GFR - Magnesium - Lipid panel - TSH - Hemoglobin A1c - Insulin, random - VITAMIN D 25 Hydroxyl       Patient was  counseled in prudent diet to achieve/maintain BMI less than 25 for weight control, BP monitoring, regular exercise and medications. Discussed med's effects and SE's. Screening labs and tests as requested with regular follow-up as recommended. Over 40 minutes of exam, counseling, chart review and high complex critical decision making was performed.   Kirtland Bouchard, MD

## 2018-08-21 NOTE — Patient Instructions (Signed)
- Vit D  And Vit C 1,000 mg  are recommended to help protect  against the Covid_19 and other Corona viruses.   - Also it's recommended to take Zinc 50 mg to help  protect against the Covid_19  And best place to get  is also on [employer] and don't pay more than 6-8 cents /pill !   ================================ Coronavirus (COVID-19) Are you at risk?  Are you at risk for the Coronavirus (COVID-19)?  To be considered HIGH RISK for Coronavirus (COVID-19), you have to meet the following criteria:  . Traveled to [place], [place], [place], [place] or [place]; or in the [place] to [place], [place], [place]  . or [place]; and have fever, cough, and shortness of breath within the last 2 weeks of travel OR . Been in close contact with a person diagnosed with COVID-19 within the last 2 weeks and have  . fever, cough,and shortness of breath .  . IF YOU DO NOT MEET THESE CRITERIA, YOU ARE CONSIDERED LOW RISK FOR COVID-19.  What to do if you are HIGH RISK for COVID-19?  Marland Kitchen If you are having a medical emergency, call 911. . Seek medical care right away. Before you go to a doctor's office, urgent care or emergency department, .  call ahead and tell them about your recent travel, contact with someone diagnosed with COVID-19  .  and your symptoms.  . You should receive instructions from your physician's office regarding next steps of care.  . When you arrive at healthcare provider, tell the healthcare staff immediately you have returned from  . visiting [place], [place], [place], [place] or [place]; or traveled in the [place] to [place], [place],  . [place] or [place] in the last two weeks or you have been in close contact with a person diagnosed with  . COVID-19 in the last 2 weeks.   . Tell the health care staff about your symptoms: fever, cough and shortness of breath. . After you have been seen by a medical provider, you will be either: o Tested for (COVID-19) and  discharged home on quarantine except to seek medical care if  o symptoms worsen, and asked to  - Stay home and avoid contact with others until you get your results (4-5 days)  - Avoid travel on public transportation if possible (such as bus, train, or airplane) or o Sent to the Emergency Department by EMS for evaluation, COVID-19 testing  and  o possible admission depending on your condition and test results.  What to do if you are LOW RISK for COVID-19?  Reduce your risk of any infection by using the same precautions used for avoiding the common cold or flu:  Marland Kitchen Wash your hands often with soap and warm water for at least 20 seconds.  If soap and water are not readily available,  . use an alcohol-based hand sanitizer with at least 60% alcohol.  . If coughing or sneezing, cover your mouth and nose by coughing or sneezing into the elbow areas of your shirt or coat, .  into a tissue or into your sleeve (not your hands). . Avoid shaking hands with others and consider head nods or verbal greetings only. . Avoid touching your eyes, nose, or mouth with unwashed hands.  . Avoid close contact with people who are sick. . Avoid places or events with large numbers of people in one location, like concerts or sporting events. . Carefully consider travel plans  you have or are making. . If you are planning any travel outside or inside the [place], visit the CDC's Travelers' Health webpage for the latest health notices. . If you have some symptoms but not all symptoms, continue to monitor at home and seek medical attention  . if your symptoms worsen. . If you are having a medical emergency, call 911.   . >>>>>>>>>>>>>>>>>>>>>>>>>>>>>>>>> . We Do NOT Approve of  [facility] [place] Soliciting Our Patients  To Do Home Visits & We Do NOT Approve of [employer] > > > > > > > > > > > > > > > > > > > > > > > > > > > > > > > > > > > > > > >  Preventive Care for Adults  A healthy lifestyle  and preventive care can promote health and wellness. Preventive health guidelines for women include the following key practices.  A routine yearly physical is a good way to check with your health care provider about your health and preventive screening. It is a chance to share any concerns and updates on your health and to receive a thorough exam.  Visit your dentist for a routine exam and preventive care every 6 months. Brush your teeth twice a day and floss once a day. Good oral hygiene prevents tooth decay and gum disease.  The frequency of eye exams is based on your age, health, family medical history, use of contact lenses, and other factors. Follow your health care provider's recommendations for frequency of eye exams.  Eat a healthy diet. Foods like vegetables, fruits, whole grains, low-fat dairy products, and lean protein foods contain the nutrients you need without too many calories. Decrease your intake of foods high in solid fats, added sugars, and salt. Eat the right amount of calories for you. Get information about a proper diet from your health care provider, if necessary.  Regular physical exercise is one of the most important things you can do for your health. Most adults should get at least 150 minutes of moderate-intensity exercise (any activity that increases your heart rate and causes you to sweat) each week. In addition, most adults need muscle-strengthening exercises on 2 or more days a week.  Maintain a healthy weight. The body mass index (BMI) is a screening tool to identify possible weight problems. It provides an estimate of body fat based on height and weight. Your health care provider can find your BMI and can help you achieve or maintain a healthy weight. For adults 20 years and older:  A BMI below 18.5 is considered underweight.  A BMI of 18.5 to 24.9 is normal.  A BMI of 25 to 29.9 is considered overweight.  A BMI of 30 and above is considered obese.  Maintain  normal blood lipids and cholesterol levels by exercising and minimizing your intake of saturated fat. Eat a balanced diet with plenty of fruit and vegetables. If your lipid or cholesterol levels are high, you are over 50, or you are at high risk for heart disease, you may need your cholesterol levels checked more frequently. Ongoing high lipid and cholesterol levels should be treated with medicines if diet and exercise are not working.  If you smoke, find out from your health care provider how to quit. If you do not use tobacco, do not start.  Lung cancer screening is recommended for adults aged 55-80 years who are at high risk for developing lung cancer because of a history   of smoking. A yearly low-dose CT scan of the lungs is recommended for people who have at least a 30-pack-year history of smoking and are a current smoker or have quit within the past 15 years. A pack year of smoking is smoking an average of 1 pack of cigarettes a day for 1 year (for example: 1 pack a day for 30 years or 2 packs a day for 15 years). Yearly screening should continue until the smoker has stopped smoking for at least 15 years. Yearly screening should be stopped for people who develop a health problem that would prevent them from having lung cancer treatment.  Avoid use of street drugs. Do not share needles with anyone. Ask for help if you need support or instructions about stopping the use of drugs.  High blood pressure causes heart disease and increases the risk of stroke.  Ongoing high blood pressure should be treated with medicines if weight loss and exercise do not work.  If you are 56-52 years old, ask your health care provider if you should take aspirin to prevent strokes.  Diabetes screening involves taking a blood sample to check your fasting blood sugar level. This should be done once every 3 years, after age 4, if you are within normal weight and without risk factors for diabetes. Testing should be considered  at a younger age or be carried out more frequently if you are overweight and have at least 1 risk factor for diabetes.  Breast cancer screening is essential preventive care for women. You should practice "breast self-awareness." This means understanding the normal appearance and feel of your breasts and may include breast self-examination. Any changes detected, no matter how small, should be reported to a health care provider. Women in their 86s and 30s should have a clinical breast exam (CBE) by a health care provider as part of a regular health exam every 1 to 3 years. After age 13, women should have a CBE every year. Starting at age 28, women should consider having a mammogram (breast X-ray test) every year. Women who have a family history of breast cancer should talk to their health care provider about genetic screening. Women at a high risk of breast cancer should talk to their health care providers about having an MRI and a mammogram every year.  Breast cancer gene (BRCA)-related cancer risk assessment is recommended for women who have family members with BRCA-related cancers. BRCA-related cancers include breast, ovarian, tubal, and peritoneal cancers. Having family members with these cancers may be associated with an increased risk for harmful changes (mutations) in the breast cancer genes BRCA1 and BRCA2. Results of the assessment will determine the need for genetic counseling and BRCA1 and BRCA2 testing.  Routine pelvic exams to screen for cancer are no longer recommended for nonpregnant women who are considered low risk for cancer of the pelvic organs (ovaries, uterus, and vagina) and who do not have symptoms. Ask your health care provider if a screening pelvic exam is right for you.  If you have had past treatment for cervical cancer or a condition that could lead to cancer, you need Pap tests and screening for cancer for at least 20 years after your treatment. If Pap tests have been discontinued,  your risk factors (such as having a new sexual partner) need to be reassessed to determine if screening should be resumed. Some women have medical problems that increase the chance of getting cervical cancer. In these cases, your health care provider may recommend more  frequent screening and Pap tests.    Colorectal cancer can be detected and often prevented. Most routine colorectal cancer screening begins at the age of 71 years and continues through age 60 years. However, your health care provider may recommend screening at an earlier age if you have risk factors for colon cancer. On a yearly basis, your health care provider may provide home test kits to check for hidden blood in the stool. Use of a small camera at the end of a tube, to directly examine the colon (sigmoidoscopy or colonoscopy), can detect the earliest forms of colorectal cancer. Talk to your health care provider about this at age 45, when routine screening begins.  Direct exam of the colon should be repeated every 5-10 years through age 24 years, unless early forms of pre-cancerous polyps or small growths are found.  Osteoporosis is a disease in which the bones lose minerals and strength with aging. This can result in serious bone fractures or breaks. The risk of osteoporosis can be identified using a bone density scan. Women ages 70 years and over and women at risk for fractures or osteoporosis should discuss screening with their health care providers. Ask your health care provider whether you should take a calcium supplement or vitamin D to reduce the rate of osteoporosis.  Menopause can be associated with physical symptoms and risks. Hormone replacement therapy is available to decrease symptoms and risks. You should talk to your health care provider about whether hormone replacement therapy is right for you.  Use sunscreen. Apply sunscreen liberally and repeatedly throughout the day. You should seek shade when your shadow is shorter  than you. Protect yourself by wearing long sleeves, pants, a wide-brimmed hat, and sunglasses year round, whenever you are outdoors.  Once a month, do a whole body skin exam, using a mirror to look at the skin on your back. Tell your health care provider of new moles, moles that have irregular borders, moles that are larger than a pencil eraser, or moles that have changed in shape or color.  Stay current with required vaccines (immunizations).  Influenza vaccine. All adults should be immunized every year.  Tetanus, diphtheria, and acellular pertussis (Td, Tdap) vaccine. Pregnant women should receive 1 dose of Tdap vaccine during each pregnancy. The dose should be obtained regardless of the length of time since the last dose. Immunization is preferred during the 27th-36th week of gestation. An adult who has not previously received Tdap or who does not know her vaccine status should receive 1 dose of Tdap. This initial dose should be followed by tetanus and diphtheria toxoids (Td) booster doses every 10 years. Adults with an unknown or incomplete history of completing a 3-dose immunization series with Td-containing vaccines should begin or complete a primary immunization series including a Tdap dose. Adults should receive a Td booster every 10 years.    Zoster vaccine. One dose is recommended for adults aged 46 years or older unless certain conditions are present.    Pneumococcal 13-valent conjugate (PCV13) vaccine. When indicated, a person who is uncertain of her immunization history and has no record of immunization should receive the PCV13 vaccine. An adult aged 22 years or older who has certain medical conditions and has not been previously immunized should receive 1 dose of PCV13 vaccine. This PCV13 should be followed with a dose of pneumococcal polysaccharide (PPSV23) vaccine. The PPSV23 vaccine dose should be obtained at least 1 or more year(s) after the dose of PCV13 vaccine. An adult  aged 83  years or older who has certain medical conditions and previously received 1 or more doses of PPSV23 vaccine should receive 1 dose of PCV13. The PCV13 vaccine dose should be obtained 1 or more years after the last PPSV23 vaccine dose.    Pneumococcal polysaccharide (PPSV23) vaccine. When PCV13 is also indicated, PCV13 should be obtained first. All adults aged 73 years and older should be immunized. An adult younger than age 59 years who has certain medical conditions should be immunized. Any person who resides in a nursing home or long-term care facility should be immunized. An adult smoker should be immunized. People with an immunocompromised condition and certain other conditions should receive both PCV13 and PPSV23 vaccines. People with human immunodeficiency virus (HIV) infection should be immunized as soon as possible after diagnosis. Immunization during chemotherapy or radiation therapy should be avoided. Routine use of PPSV23 vaccine is not recommended for American Indians, [place] Natives, or people younger than 65 years unless there are medical conditions that require PPSV23 vaccine. When indicated, people who have unknown immunization and have no record of immunization should receive PPSV23 vaccine. One-time revaccination 5 years after the first dose of PPSV23 is recommended for people aged 19-64 years who have chronic kidney failure, nephrotic syndrome, asplenia, or immunocompromised conditions. People who received 1-2 doses of PPSV23 before age 68 years should receive another dose of PPSV23 vaccine at age 72 years or later if at least 5 years have passed since the previous dose. Doses of PPSV23 are not needed for people immunized with PPSV23 at or after age 42 years.   Preventive Services / Frequency  Ages 59 years and over  Blood pressure check.  Lipid and cholesterol check.  Lung cancer screening. / Every year if you are aged 23-80 years and have a 30-pack-year history of smoking and  currently smoke or have quit within the past 15 years. Yearly screening is stopped once you have quit smoking for at least 15 years or develop a health problem that would prevent you from having lung cancer treatment.  Clinical breast exam.** / Every year after age 23 years.   BRCA-related cancer risk assessment.** / For women who have family members with a BRCA-related cancer (breast, ovarian, tubal, or peritoneal cancers).  Mammogram.** / Every year beginning at age 71 years and continuing for as long as you are in good health. Consult with your health care provider.  Pap test.** / Every 3 years starting at age 44 years through age 78 or 24 years with 3 consecutive normal Pap tests. Testing can be stopped between 65 and 70 years with 3 consecutive normal Pap tests and no abnormal Pap or HPV tests in the past 10 years.  Fecal occult blood test (FOBT) of stool. / Every year beginning at age 27 years and continuing until age 38 years. You may not need to do this test if you get a colonoscopy every 10 years.  Flexible sigmoidoscopy or colonoscopy.** / Every 5 years for a flexible sigmoidoscopy or every 10 years for a colonoscopy beginning at age 37 years and continuing until age 68 years.  Hepatitis C blood test.** / For all people born from [birth year] through 1966 and any individual with known risks for hepatitis C.  Osteoporosis screening.** / A one-time screening for women ages 61 years and over and women at risk for fractures or osteoporosis.  Skin self-exam. / Monthly.  Influenza vaccine. / Every year.  Tetanus, diphtheria, and acellular pertussis (Tdap/Td) vaccine.** /  1 dose of Td every 10 years.  Zoster vaccine.** / 1 dose for adults aged 60 years or older.  Pneumococcal 13-valent conjugate (PCV13) vaccine.** / Consult your health care provider.  Pneumococcal polysaccharide (PPSV23) vaccine.** / 1 dose for all adults aged 65 years and older. Screening for abdominal aortic aneurysm (AAA)   by ultrasound is recommended for people who have history of high blood pressure or who are current or former smokers. ++++++++++++++++++++ Recommend Adult Low Dose Aspirin or  coated  Aspirin 81 mg daily  To reduce risk of Colon Cancer 40 %,  Skin Cancer 26 % ,  Melanoma 46%  and  Pancreatic cancer 60% ++++++++++++++++++++ Vitamin D goal  is between 70-100.  Please make sure that you are taking your Vitamin D as directed.  It is very important as a natural anti-inflammatory  helping hair, skin, and nails, as well as reducing stroke and heart attack risk.  It helps your bones and helps with mood. It also decreases numerous cancer risks so please take it as directed.  Low Vit D is associated with a 200-300% higher risk for CANCER  and 200-300% higher risk for HEART   ATTACK  &  STROKE.   ...................................... It is also associated with higher death rate at younger ages,  autoimmune diseases like Rheumatoid arthritis, Lupus, Multiple Sclerosis.    Also many other serious conditions, like depression, Alzheimer's Dementia, infertility, muscle aches, fatigue, fibromyalgia - just to name a few. ++++++++++++++++++ Recommend the book "The END of DIETING" by Dr Joel Fuhrman  & the book "The END of DIABETES " by Dr Joel Fuhrman At [employer] - get book & Audio CD's    Being diabetic has a  300% increased risk for heart attack, stroke, cancer, and alzheimer- type vascular dementia. It is very important that you work harder with diet by avoiding all foods that are white. Avoid white rice (brown & wild rice is OK), white potatoes (sweetpotatoes in moderation is OK), White bread or wheat bread or anything made out of white flour like bagels, donuts, rolls, buns, biscuits, cakes, pastries, cookies, pizza crust, and pasta (made from white flour & egg whites) - vegetarian pasta or spinach or wheat pasta is OK. Multigrain breads like Arnold's or [employer], or multigrain sandwich thins  or flatbreads.  Diet, exercise and weight loss can reverse and cure diabetes in the early stages.  Diet, exercise and weight loss is very important in the control and prevention of complications of diabetes which affects every system in your body, ie. Brain - dementia/stroke, eyes - glaucoma/blindness, heart - heart attack/heart failure, kidneys - dialysis, stomach - gastric paralysis, intestines - malabsorption, nerves - severe painful neuritis, circulation - gangrene & loss of a leg(s), and finally cancer and Alzheimers.    I recommend avoid fried & greasy foods,  sweets/candy, white rice (brown or wild rice or Quinoa is OK), white potatoes (sweet potatoes are OK) - anything made from white flour - bagels, doughnuts, rolls, buns, biscuits,white and wheat breads, pizza crust and traditional pasta made of white flour & egg white(vegetarian pasta or spinach or wheat pasta is OK).  Multi-grain bread is OK - like multi-grain flat bread or sandwich thins. Avoid alcohol in excess. Exercise is also important.    Eat all the vegetables you want - avoid meat, especially red meat and dairy - especially cheese.  Cheese is the most concentrated form of trans-fats which is the worst thing to clog up our arteries. Veggie   cheese is OK which can be found in the fresh produce section at [employer] or [employer] or [employer]  +++++++++++++++++++ DASH Eating Plan  DASH stands for "Dietary Approaches to Stop Hypertension."   The DASH eating plan is a healthy eating plan that has been shown to reduce high blood pressure (hypertension). Additional health benefits may include reducing the risk of type 2 diabetes mellitus, heart disease, and stroke. The DASH eating plan may also help with weight loss. WHAT DO I NEED TO KNOW ABOUT THE DASH EATING PLAN? For the DASH eating plan, you will follow these general guidelines:  Choose foods with a percent daily value for sodium of less than 5% (as listed on the food  label).  Use salt-free seasonings or herbs instead of table salt or sea salt.  Check with your health care provider or pharmacist before using salt substitutes.  Eat lower-sodium products, often labeled as "lower sodium" or "no salt added."  Eat fresh foods.  Eat more vegetables, fruits, and low-fat dairy products.  Choose whole grains. Look for the word "whole" as the first word in the ingredient list.  Choose fish   Limit sweets, desserts, sugars, and sugary drinks.  Choose heart-healthy fats.  Eat veggie cheese   Eat more home-cooked food and less restaurant, buffet, and fast food.  Limit fried foods.  Cook foods using methods other than frying.  Limit canned vegetables. If you do use them, rinse them well to decrease the sodium.  When eating at a restaurant, ask that your food be prepared with less salt, or no salt if possible.                      WHAT FOODS CAN I EAT? Read Dr Fara Olden Fuhrman's books on The End of Dieting & The End of Diabetes  Grains Whole grain or whole wheat bread. Brown rice. Whole grain or whole wheat pasta. Quinoa, bulgur, and whole grain cereals. Low-sodium cereals. Corn or whole wheat flour tortillas. Whole grain cornbread. Whole grain crackers. Low-sodium crackers.  Vegetables Fresh or frozen vegetables (raw, steamed, roasted, or grilled). Low-sodium or reduced-sodium tomato and vegetable juices. Low-sodium or reduced-sodium tomato sauce and paste. Low-sodium or reduced-sodium canned vegetables.   Fruits All fresh, canned (in natural juice), or frozen fruits.  Protein Products  All fish and seafood.  Dried beans, peas, or lentils. Unsalted nuts and seeds. Unsalted canned beans.  Dairy Low-fat dairy products, such as skim or 1% milk, 2% or reduced-fat cheeses, low-fat ricotta or cottage cheese, or plain low-fat yogurt. Low-sodium or reduced-sodium cheeses.  Fats and Oils Tub margarines without trans fats. Light or reduced-fat mayonnaise  and salad dressings (reduced sodium). Avocado. Safflower, olive, or canola oils. Natural peanut or almond butter.  Other Unsalted popcorn and pretzels. The items listed above may not be a complete list of recommended foods or beverages. Contact your dietitian for more options.  +++++++++++++++  WHAT FOODS ARE NOT RECOMMENDED? Grains/ White flour or wheat flour White bread. White pasta. White rice. Refined cornbread. Bagels and croissants. Crackers that contain trans fat.  Vegetables  Creamed or fried vegetables. Vegetables in a . Regular canned vegetables. Regular canned tomato sauce and paste. Regular tomato and vegetable juices.  Fruits Dried fruits. Canned fruit in light or heavy syrup. Fruit juice.  Meat and Other Protein Products Meat in general - RED meat & White meat.  Fatty cuts of meat. Ribs, chicken wings, all processed meats as bacon, sausage, bologna, salami,  fatback, hot dogs, bratwurst and packaged luncheon meats.  Dairy Whole or 2% milk, cream, half-and-half, and cream cheese. Whole-fat or sweetened yogurt. Full-fat cheeses or blue cheese. Non-dairy creamers and whipped toppings. Processed cheese, cheese spreads, or cheese curds.  Condiments Onion and garlic salt, seasoned salt, table salt, and sea salt. Canned and packaged gravies. Worcestershire sauce. Tartar sauce. Barbecue sauce. Teriyaki sauce. Soy sauce, including reduced sodium. Steak sauce. Fish sauce. Oyster sauce. Cocktail sauce. Horseradish. Ketchup and mustard. Meat flavorings and tenderizers. Bouillon cubes. Hot sauce. Tabasco sauce. Marinades. Taco seasonings. Relishes.  Fats and Oils Butter, stick margarine, lard, shortening and bacon fat. Coconut, palm kernel, or palm oils. Regular salad dressings.  Pickles and olives. Salted popcorn and pretzels.  The items listed above may not be a complete list of foods and beverages to avoid.    

## 2018-08-22 ENCOUNTER — Other Ambulatory Visit: Payer: Self-pay

## 2018-08-22 ENCOUNTER — Ambulatory Visit (INDEPENDENT_AMBULATORY_CARE_PROVIDER_SITE_OTHER): Payer: Medicare Other | Admitting: Internal Medicine

## 2018-08-22 VITALS — BP 126/88 | HR 80 | Temp 97.0°F | Resp 16 | Ht 64.0 in | Wt 157.8 lb

## 2018-08-22 DIAGNOSIS — Z0000 Encounter for general adult medical examination without abnormal findings: Secondary | ICD-10-CM | POA: Diagnosis not present

## 2018-08-22 DIAGNOSIS — I700 Atherosclerosis of aorta: Secondary | ICD-10-CM

## 2018-08-22 DIAGNOSIS — M51369 Other intervertebral disc degeneration, lumbar region without mention of lumbar back pain or lower extremity pain: Secondary | ICD-10-CM

## 2018-08-22 DIAGNOSIS — I10 Essential (primary) hypertension: Secondary | ICD-10-CM

## 2018-08-22 DIAGNOSIS — Z136 Encounter for screening for cardiovascular disorders: Secondary | ICD-10-CM

## 2018-08-22 DIAGNOSIS — R7309 Other abnormal glucose: Secondary | ICD-10-CM | POA: Diagnosis not present

## 2018-08-22 DIAGNOSIS — F17200 Nicotine dependence, unspecified, uncomplicated: Secondary | ICD-10-CM

## 2018-08-22 DIAGNOSIS — E559 Vitamin D deficiency, unspecified: Secondary | ICD-10-CM | POA: Diagnosis not present

## 2018-08-22 DIAGNOSIS — Z79899 Other long term (current) drug therapy: Secondary | ICD-10-CM

## 2018-08-22 DIAGNOSIS — Z8249 Family history of ischemic heart disease and other diseases of the circulatory system: Secondary | ICD-10-CM

## 2018-08-22 DIAGNOSIS — J449 Chronic obstructive pulmonary disease, unspecified: Secondary | ICD-10-CM

## 2018-08-22 DIAGNOSIS — E782 Mixed hyperlipidemia: Secondary | ICD-10-CM | POA: Diagnosis not present

## 2018-08-22 DIAGNOSIS — M5136 Other intervertebral disc degeneration, lumbar region: Secondary | ICD-10-CM

## 2018-08-22 DIAGNOSIS — G4733 Obstructive sleep apnea (adult) (pediatric): Secondary | ICD-10-CM

## 2018-08-22 DIAGNOSIS — Z87891 Personal history of nicotine dependence: Secondary | ICD-10-CM

## 2018-08-22 DIAGNOSIS — Z0001 Encounter for general adult medical examination with abnormal findings: Secondary | ICD-10-CM

## 2018-08-22 DIAGNOSIS — Z122 Encounter for screening for malignant neoplasm of respiratory organs: Secondary | ICD-10-CM

## 2018-08-23 LAB — HEMOGLOBIN A1C
Hgb A1c MFr Bld: 5.9 % of total Hgb — ABNORMAL HIGH (ref <5.7)
Mean Plasma Glucose: 123 (calc)
eAG (mmol/L): 6.8 (calc)

## 2018-08-23 LAB — COMPLETE METABOLIC PANEL WITH GFR
AG Ratio: 1.8 (calc) (ref 1.0–2.5)
ALT: 31 U/L — ABNORMAL HIGH (ref 6–29)
AST: 27 U/L (ref 10–35)
Albumin: 4.4 g/dL (ref 3.6–5.1)
Alkaline phosphatase (APISO): 100 U/L (ref 37–153)
BUN: 14 mg/dL (ref 7–25)
CO2: 27 mmol/L (ref 20–32)
Calcium: 9.7 mg/dL (ref 8.6–10.4)
Chloride: 104 mmol/L (ref 98–110)
Creat: 0.84 mg/dL (ref 0.50–0.99)
GFR, Est African American: 86 mL/min/{1.73_m2} (ref >=60)
GFR, Est Non African American: 74 mL/min/{1.73_m2} (ref >=60)
Globulin: 2.5 g/dL (calc) (ref 1.9–3.7)
Glucose, Bld: 96 mg/dL (ref 65–99)
Potassium: 4.5 mmol/L (ref 3.5–5.3)
Sodium: 139 mmol/L (ref 135–146)
Total Bilirubin: 0.7 mg/dL (ref 0.2–1.2)
Total Protein: 6.9 g/dL (ref 6.1–8.1)

## 2018-08-23 LAB — CBC WITH DIFFERENTIAL/PLATELET
Absolute Monocytes: 498 cells/uL (ref 200–950)
Basophils Absolute: 40 cells/uL (ref 0–200)
Basophils Relative: 0.5 %
Eosinophils Absolute: 71 cells/uL (ref 15–500)
Eosinophils Relative: 0.9 %
HCT: 41.9 % (ref 35.0–45.0)
Hemoglobin: 13.7 g/dL (ref 11.7–15.5)
Lymphs Abs: 1801 cells/uL (ref 850–3900)
MCH: 29.8 pg (ref 27.0–33.0)
MCHC: 32.7 g/dL (ref 32.0–36.0)
MCV: 91.1 fL (ref 80.0–100.0)
MPV: 10.4 fL (ref 7.5–12.5)
Monocytes Relative: 6.3 %
Neutro Abs: 5491 cells/uL (ref 1500–7800)
Neutrophils Relative %: 69.5 %
Platelets: 339 10*3/uL (ref 140–400)
RBC: 4.6 10*6/uL (ref 3.80–5.10)
RDW: 12.7 % (ref 11.0–15.0)
Total Lymphocyte: 22.8 %
WBC: 7.9 10*3/uL (ref 3.8–10.8)

## 2018-08-23 LAB — TSH: TSH: 1.23 mIU/L (ref 0.40–4.50)

## 2018-08-23 LAB — URINALYSIS, ROUTINE W REFLEX MICROSCOPIC
Bilirubin Urine: NEGATIVE
Glucose, UA: NEGATIVE
Hgb urine dipstick: NEGATIVE
Ketones, ur: NEGATIVE
Leukocytes,Ua: NEGATIVE
Nitrite: NEGATIVE
Protein, ur: NEGATIVE
Specific Gravity, Urine: 1.01 (ref 1.001–1.03)
pH: 6 (ref 5.0–8.0)

## 2018-08-23 LAB — LIPID PANEL
Cholesterol: 154 mg/dL (ref <200)
HDL: 68 mg/dL (ref >=50)
LDL Cholesterol (Calc): 70 mg/dL (calc)
Non-HDL Cholesterol (Calc): 86 mg/dL (calc) (ref <130)
Total CHOL/HDL Ratio: 2.3 (calc) (ref <5.0)
Triglycerides: 75 mg/dL (ref <150)

## 2018-08-23 LAB — MAGNESIUM: Magnesium: 2.2 mg/dL (ref 1.5–2.5)

## 2018-08-23 LAB — MICROALBUMIN / CREATININE URINE RATIO
Creatinine, Urine: 73 mg/dL (ref 20–275)
Microalb Creat Ratio: 15 mcg/mg creat (ref <30)
Microalb, Ur: 1.1 mg/dL

## 2018-08-23 LAB — INSULIN, RANDOM: Insulin: 11.6 u[IU]/mL

## 2018-08-23 LAB — VITAMIN D 25 HYDROXY (VIT D DEFICIENCY, FRACTURES): Vit D, 25-Hydroxy: 82 ng/mL (ref 30–100)

## 2018-08-27 ENCOUNTER — Encounter: Payer: Self-pay | Admitting: Internal Medicine

## 2018-09-08 DIAGNOSIS — M545 Low back pain: Secondary | ICD-10-CM | POA: Diagnosis not present

## 2018-09-08 DIAGNOSIS — M4326 Fusion of spine, lumbar region: Secondary | ICD-10-CM | POA: Diagnosis not present

## 2018-09-08 DIAGNOSIS — M791 Myalgia, unspecified site: Secondary | ICD-10-CM | POA: Diagnosis not present

## 2018-09-21 ENCOUNTER — Other Ambulatory Visit: Payer: Self-pay

## 2018-09-21 ENCOUNTER — Ambulatory Visit
Admission: RE | Admit: 2018-09-21 | Discharge: 2018-09-21 | Disposition: A | Discharge status: Home / Self Care | Payer: Medicare Other | Source: Ambulatory Visit | Attending: Internal Medicine | Admitting: Internal Medicine

## 2018-09-21 DIAGNOSIS — Z87891 Personal history of nicotine dependence: Secondary | ICD-10-CM | POA: Diagnosis not present

## 2018-09-21 DIAGNOSIS — Z122 Encounter for screening for malignant neoplasm of respiratory organs: Secondary | ICD-10-CM

## 2018-09-21 DIAGNOSIS — F17200 Nicotine dependence, unspecified, uncomplicated: Secondary | ICD-10-CM

## 2018-10-03 ENCOUNTER — Other Ambulatory Visit: Payer: Self-pay | Admitting: Adult Health

## 2018-10-09 ENCOUNTER — Other Ambulatory Visit: Payer: Self-pay | Admitting: Physician Assistant

## 2018-10-12 ENCOUNTER — Telehealth: Payer: Self-pay | Admitting: *Deleted

## 2018-10-12 ENCOUNTER — Other Ambulatory Visit: Payer: Self-pay | Admitting: Internal Medicine

## 2018-10-12 MED ORDER — GABAPENTIN 600 MG PO TABS: ORAL_TABLET | ORAL | 2 refills | Status: DC

## 2018-10-12 MED ORDER — FLUOXETINE HCL 40 MG PO CAPS: ORAL_CAPSULE | ORAL | 3 refills | Status: DC

## 2018-10-12 NOTE — Telephone Encounter (Signed)
Patient has run out of Prozac, due to taking 2 daily, at times.  She did not realize the directions had changed to daily. Her insurance will not cover until 10/22/2018. The patient was advised she can use [facility], but declined and states she will wait until 10/22/2018.

## 2018-10-13 DIAGNOSIS — M545 Low back pain: Secondary | ICD-10-CM | POA: Diagnosis not present

## 2018-10-13 DIAGNOSIS — M791 Myalgia, unspecified site: Secondary | ICD-10-CM | POA: Diagnosis not present

## 2018-10-13 DIAGNOSIS — M4326 Fusion of spine, lumbar region: Secondary | ICD-10-CM | POA: Diagnosis not present

## 2018-11-13 DIAGNOSIS — M545 Low back pain: Secondary | ICD-10-CM | POA: Diagnosis not present

## 2018-11-13 DIAGNOSIS — M791 Myalgia, unspecified site: Secondary | ICD-10-CM | POA: Diagnosis not present

## 2018-11-13 DIAGNOSIS — M4326 Fusion of spine, lumbar region: Secondary | ICD-10-CM | POA: Diagnosis not present

## 2018-11-13 DIAGNOSIS — M7552 Bursitis of left shoulder: Secondary | ICD-10-CM | POA: Diagnosis not present

## 2018-11-13 DIAGNOSIS — M25512 Pain in left shoulder: Secondary | ICD-10-CM | POA: Diagnosis not present

## 2018-11-22 ENCOUNTER — Encounter: Payer: Self-pay | Admitting: Adult Health

## 2018-11-22 NOTE — Progress Notes (Signed)
FOLLOW UP  Assessment and Plan:   COPD/smoking Quit smoking 04/2017; doing well; asymptomatic Continue to monitor COPD symptoms are mild and declines inhaler  Atherosclerosis of aorta Control blood pressure, cholesterol, glucose, increase exercise.   Hypertension Well controlled currently off of medications Monitor blood pressure at home; patient to call if consistently greater than 130/80 Continue DASH diet.   Reminder to go to the ER if any CP, SOB, nausea, dizziness, severe HA, changes vision/speech, left arm numbness and tingling and jaw pain.  Cholesterol Currently at goal of LDL <70; simvastatin 40 mg daily at this time Continue low cholesterol diet and exercise.  Check lipid panel.   Prediabetes Continue diet and exercise.  Perform daily foot/skin check, notify office of any concerning changes.  Defer A1C as just had checked; monitor weight, check serum glucose  BMI 26  Continue to recommend diet heavy in fruits and veggies and low in animal meats, cheeses, and dairy products, appropriate calorie intake Discuss exercise recommendations routinely She would like to reduce weight to 130 lb, initial goal of 145 lb set today, recommended water aerobics for activity due to extensive joint pain Continue to monitor weight at each visit  Vitamin D Def At goal at last visit; continue supplementation to maintain goal of 60-100 Defer Vit D level  Depression Continue medications; trying addition of trazodone due to stable mood but insomnia with reduction of prozac by Dr. Melford Aase; sent in 50 mg tab, advised 1-2 tabs at night for sleep, minimum necessary dose Cautioned potential for SS, symptoms reviewed and information provided on AVS Lifestyle discussed: diet/exerise, sleep hygiene, stress management, hydration   Continue diet and meds as discussed. Further disposition pending results of labs. Discussed med's effects and SE's.   Over 30 minutes of exam, counseling, chart  review, and critical decision making was performed.   Future Appointments  Date Time Provider [place]  03/01/2019 11:00 AM Unk Pinto, MD [facility] None  05/23/2019 11:15 AM Vicie Mutters, PA-C [facility] None  09/03/2019  2:00 PM Unk Pinto, MD [facility] None    ----------------------------------------------------------------------------------------------------------------------  HPI 63 y.o. female  presents for 3 month follow up on hypertension, cholesterol, prediabetes, weight and vitamin D deficiency.   The patient has has Chronic LBP from lumbar DDD s/p surgery x 2 by Dr. Patrice Paradise in 2004 and is on SS Disability for chronic pain and is followed by Dr Greta Doom for pain management monthly. 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 Tamera Punt.   She has depression stable on SSRI (prozac) treatment. She is having difficulty sleeping, has tried gabapentin 600 mg but didn't feel this was helpful and continued to toss and turn for hours.   She has a 35+ year smoking history and consequent COPD, reports smoking cessation 05/10/2017 and continues to do well. She had benign appearing Lung cancer screen CT on 09/21/2018.   BMI is Body mass index is 26.26 kg/m., she has been working on diet, exercise limited due to chronic pain. She plans to give up sodas and would like to get down to 130 lb.  Wt Readings from Last 3 Encounters:  11/23/18 153 lb (69.4 kg)  09/21/18 167 lb (75.8 kg)  08/22/18 157 lb 12.8 oz (71.6 kg)   Her blood pressure has been controlled at home, today their BP is BP: 106/66  She does not workout. She denies chest pain, shortness of breath, dizziness.    She is on cholesterol medication Simvastatin 40 mg daily and denies  myalgias. Her cholesterol is not at goal. The cholesterol last visit was:   Lab Results  Component Value Date   CHOL 154 08/22/2018   HDL 68 08/22/2018   LDLCALC 70 08/22/2018   TRIG 75 08/22/2018   CHOLHDL 2.3  08/22/2018    She has been working on diet for prediabetes, and denies foot ulcerations, increased appetite, nausea, polydipsia, polyuria and visual disturbances. Last A1C in the office was:  Lab Results  Component Value Date   HGBA1C 5.9 (H) 08/22/2018   Lab Results  Component Value Date   GFRNONAA 74 08/22/2018   Patient is on Vitamin D supplement.   Lab Results  Component Value Date   VD25OH 82 08/22/2018        Current Medications:  Current Outpatient Medications on File Prior to Visit  Medication Sig  . baclofen (LIORESAL) 20 MG tablet Take 20 mg by mouth 3 (three) times daily as needed for muscle spasms.  . cholecalciferol 5000 units TABS Take 2 tablets daily  . FLUoxetine (PROZAC) 40 MG capsule Take 1 capsule Daily for Mood  . ketotifen (ZADITOR) 0.025 % ophthalmic solution Place 1 drop into both eyes 3 (three) times daily as needed (allergies).  Marland Kitchen oxyCODONE (ROXICODONE) 15 MG immediate release tablet Take 15 mg by mouth every 6 (six) hours as needed for pain.   . simvastatin (ZOCOR) 40 MG tablet Take 1 tablet at Bedtime for Cholesterol  . PAIN RELIEVER EXTRA STRENGTH 500 MG tablet TAKE 2 TABLETS(1000 MG TOTAL) BY MOUTH THREE TIMES DAILY AS NEEDED FOR MODERATE PAIN (Patient not taking: Reported on 11/23/2018)   No current facility-administered medications on file prior to visit.      Allergies:  Allergies  Allergen Reactions  . Augmentin [Amoxicillin-Pot Clavulanate] Nausea And Vomiting  . Codeine Nausea And Vomiting  . Lipitor [Atorvastatin]     Leg pain  . Wellbutrin [Bupropion]     Pt does not recall reaction   . Zoloft [Sertraline Hcl]     Pt does not recall reaction      Medical History:  Past Medical History:  Diagnosis Date  . Allergy   . Arthritis   . AVN (avascular necrosis of bone) (HCC)    Left shoulder  . COPD (chronic obstructive pulmonary disease) ([place])   . Depression   . Diabetes mellitus without complication (HCC)    Pre-diabetes.   Marland Kitchen  GERD (gastroesophageal reflux disease)   . Hyperlipidemia   . Hypertension   . Memory disorder 12/23/2016  . TIA (transient ischemic attack) 09/30/2017  . Vitamin D deficiency    Family history- Reviewed and unchanged Social history- Reviewed and unchanged   Review of Systems:  Review of Systems  Constitutional: Negative for malaise/fatigue and weight loss.  HENT: Negative for hearing loss and tinnitus.   Eyes: Negative for blurred vision and double vision.  Respiratory: Negative for cough, shortness of breath and wheezing.   Cardiovascular: Negative for chest pain, palpitations, orthopnea, claudication and leg swelling.  Gastrointestinal: Negative for abdominal pain, blood in stool, constipation, diarrhea, heartburn, melena, nausea and vomiting.  Genitourinary: Negative.   Musculoskeletal: Positive for back pain and joint pain (bilateral hips, knees, left shoulder, known AVN - has seen Dr. Tamera Punt). Negative for myalgias.  Skin: Negative for rash.  Neurological: Negative for dizziness, tingling, sensory change, weakness and headaches.  Endo/Heme/Allergies: Negative for polydipsia.  Psychiatric/Behavioral: Negative for depression, memory loss and suicidal ideas. The patient has insomnia. The patient is not nervous/anxious.   All  other systems reviewed and are negative.    Physical Exam: BP 106/66   Pulse 74   Temp (!) 97 F (36.1 C)   Ht 5\' 4"  (1.626 m)   Wt 153 lb (69.4 kg)   SpO2 96%   BMI 26.26 kg/m  Wt Readings from Last 3 Encounters:  11/23/18 153 lb (69.4 kg)  09/21/18 167 lb (75.8 kg)  08/22/18 157 lb 12.8 oz (71.6 kg)   General Appearance: Well nourished, in no apparent distress. Eyes: PERRLA, EOMs, conjunctiva no swelling or erythema Sinuses: No Frontal/maxillary tenderness ENT/Mouth: Ext aud canals clear, TMs without erythema, bulging. No erythema, swelling, or exudate on post pharynx.  Tonsils not swollen or erythematous. Hearing normal.  Neck: Supple,  thyroid normal.  Respiratory: Respiratory effort normal, BS equal bilaterally without rales, rhonchi, wheezing or stridor.  Cardio: RRR with no MRGs. Brisk peripheral pulses without edema.  Abdomen: Soft, + BS.  Non tender, no guarding, rebound, hernias, masses. Lymphatics: Non tender without lymphadenopathy.  Musculoskeletal: Full ROM, symmetrical strength, Antalgic gait Skin: Warm, dry without rashes, lesions, ecchymosis.  Neuro: Cranial nerves intact. No cerebellar symptoms.  Psych: Awake and oriented X 3, normal affect, Insight and Judgment appropriate.    Izora Ribas, NP 11:22 AM [facility]

## 2018-11-23 ENCOUNTER — Encounter: Payer: Self-pay | Admitting: Adult Health

## 2018-11-23 ENCOUNTER — Other Ambulatory Visit: Payer: Self-pay

## 2018-11-23 ENCOUNTER — Ambulatory Visit (INDEPENDENT_AMBULATORY_CARE_PROVIDER_SITE_OTHER): Payer: Medicare Other | Admitting: Adult Health

## 2018-11-23 VITALS — BP 106/66 | HR 74 | Temp 97.0°F | Ht 64.0 in | Wt 153.0 lb

## 2018-11-23 DIAGNOSIS — G4733 Obstructive sleep apnea (adult) (pediatric): Secondary | ICD-10-CM

## 2018-11-23 DIAGNOSIS — N182 Chronic kidney disease, stage 2 (mild): Secondary | ICD-10-CM | POA: Diagnosis not present

## 2018-11-23 DIAGNOSIS — I700 Atherosclerosis of aorta: Secondary | ICD-10-CM | POA: Diagnosis not present

## 2018-11-23 DIAGNOSIS — E782 Mixed hyperlipidemia: Secondary | ICD-10-CM

## 2018-11-23 DIAGNOSIS — E559 Vitamin D deficiency, unspecified: Secondary | ICD-10-CM

## 2018-11-23 DIAGNOSIS — Z79899 Other long term (current) drug therapy: Secondary | ICD-10-CM

## 2018-11-23 DIAGNOSIS — Z6826 Body mass index (BMI) 26.0-26.9, adult: Secondary | ICD-10-CM

## 2018-11-23 DIAGNOSIS — G894 Chronic pain syndrome: Secondary | ICD-10-CM

## 2018-11-23 DIAGNOSIS — J449 Chronic obstructive pulmonary disease, unspecified: Secondary | ICD-10-CM

## 2018-11-23 DIAGNOSIS — I10 Essential (primary) hypertension: Secondary | ICD-10-CM

## 2018-11-23 DIAGNOSIS — R7309 Other abnormal glucose: Secondary | ICD-10-CM

## 2018-11-23 DIAGNOSIS — F3341 Major depressive disorder, recurrent, in partial remission: Secondary | ICD-10-CM

## 2018-11-23 MED ORDER — TRAZODONE HCL 50 MG PO TABS: ORAL_TABLET | ORAL | 1 refills | Status: DC

## 2018-11-23 NOTE — Patient Instructions (Addendum)
Goals    . Blood Pressure < 130/80    . LDL CALC < 70    . Weight (lb) < 145 lb (65.8 kg)       Try adding an allergy medication for 2 weeks, then can try over the counter nexium or other acid reflux medication nightly for 1-2 weeks and see if this improves your cough symptoms  Try trazodone 50-100 mg at night for sleep; start with lower dose 1-2 hours prior to bedtime. This medication will also boost your prozac.   Please be aware that some of the medications that you are on (prozac and trazodone) can sometimes cause a rare and potentially dangerous adverse reaction, called SEROTONIN SYNDROME: Symptoms of this condition include (but are not limited to):  Agitation or restlessness, confusion, rapid heart rate and high blood pressure, dilated pupils, loss of muscle coordination or twitching muscles, muscle rigidity/stiffness, sweating and/or flushing, diarrhea, headache, shivering, goose bumps. If you have any of these symptoms you may have to stop the medication. Call your health care provider immediately.  Severe serotonin syndrome can be life-threatening emergency. Signs and symptoms of a severe reaction may include: high fever, seizures, irregular heartbeat, unconsciousness or altered level of awareness or personality changes.  If you have any of these new symptoms, call 911 or have someone take you to the emergency room.    Also here is some information about good sleep hygiene.   Insomnia Insomnia is frequent trouble falling and/or staying asleep. Insomnia can be a long term problem or a short term problem. Both are common. Insomnia can be a short term problem when the wakefulness is related to a certain stress or worry. Long term insomnia is often related to ongoing stress during waking hours and/or poor sleeping habits. Overtime, sleep deprivation itself can make the problem worse. Every little thing feels more severe because you are overtired and your ability to cope is  decreased. CAUSES   Stress, anxiety, and depression.  Poor sleeping habits.  Distractions such as TV in the bedroom.  Naps close to bedtime.  Engaging in emotionally charged conversations before bed.  Technical reading before sleep.  Alcohol and other sedatives. They may make the problem worse. They can hurt normal sleep patterns and normal dream activity.  Stimulants such as caffeine for several hours prior to bedtime.  Pain syndromes and shortness of breath can cause insomnia.  Exercise late at night.  Changing time zones may cause sleeping problems (jet lag). It is sometimes helpful to have someone observe your sleeping patterns. They should look for periods of not breathing during the night (sleep apnea). They should also look to see how long those periods last. If you live alone or observers are uncertain, you can also be observed at a sleep clinic where your sleep patterns will be professionally monitored. Sleep apnea requires a checkup and treatment. Give your caregivers your medical history. Give your caregivers observations your family has made about your sleep.  SYMPTOMS   Not feeling rested in the morning.  Anxiety and restlessness at bedtime.  Difficulty falling and staying asleep. TREATMENT   Your caregiver may prescribe treatment for an underlying medical disorders. Your caregiver can give advice or help if you are using alcohol or other drugs for self-medication. Treatment of underlying problems will usually eliminate insomnia problems.  Medications can be prescribed for short time use. They are generally not recommended for lengthy use.  Over-the-counter sleep medicines are not recommended for lengthy use. They can  be habit forming.  You can promote easier sleeping by making lifestyle changes such as:  Using relaxation techniques that help with breathing and reduce muscle tension.  Exercising earlier in the day.  Changing your diet and the time of your  last meal. No night time snacks.  Establish a regular time to go to bed.  Counseling can help with stressful problems and worry.  Soothing music and white noise may be helpful if there are background noises you cannot remove.  Stop tedious detailed work at least one hour before bedtime. HOME CARE INSTRUCTIONS   Keep a diary. Inform your caregiver about your progress. This includes any medication side effects. See your caregiver regularly. Take note of:  Times when you are asleep.  Times when you are awake during the night.  The quality of your sleep.  How you feel the next day. This information will help your caregiver care for you.  Get out of bed if you are still awake after 15 minutes. Read or do some quiet activity. Keep the lights down. Wait until you feel sleepy and go back to bed.  Keep regular sleeping and waking hours. Avoid naps.  Exercise regularly.  Avoid distractions at bedtime. Distractions include watching television or engaging in any intense or detailed activity like attempting to balance the household checkbook.  Develop a bedtime ritual. Keep a familiar routine of bathing, brushing your teeth, climbing into bed at the same time each night, listening to soothing music. Routines increase the success of falling to sleep faster.  Use relaxation techniques. This can be using breathing and muscle tension release routines. It can also include visualizing peaceful scenes. You can also help control troubling or intruding thoughts by keeping your mind occupied with boring or repetitive thoughts like the old concept of counting sheep. You can make it more creative like imagining planting one beautiful flower after another in your backyard garden.  During your day, work to eliminate stress. When this is not possible use some of the previous suggestions to help reduce the anxiety that accompanies stressful situations. MAKE SURE YOU:   Understand these instructions.  Will  watch your condition.  Will get help right away if you are not doing well or get worse. Document Released: 01/30/2000 Document Revised: 04/26/2011 Document Reviewed: 03/01/2007 [facility] Patient Information 2015 [place], [place]. This information is not intended to replace advice given to you by your health care provider. Make sure you discuss any questions you have with your health care provider.     Trazodone tablets What is this medicine? TRAZODONE (TRAZ oh done) is used to treat depression and insomnia. This medicine may be used for other purposes; ask your health care provider or pharmacist if you have questions. COMMON BRAND NAME(S): Desyrel What should I tell my health care provider before I take this medicine? They need to know if you have any of these conditions:  attempted suicide or thinking about it  bipolar disorder  bleeding problems  glaucoma  heart disease, or previous heart attack  irregular heart beat  kidney or liver disease  low levels of sodium in the blood  an unusual or allergic reaction to trazodone, other medicines, foods, dyes or preservatives  pregnant or trying to get pregnant  breast-feeding How should I use this medicine? Take this medicine by mouth with a glass of water. Follow the directions on the prescription label. Take this medicine shortly after a meal or a light snack. Take your medicine at regular intervals.  Do not take your medicine more often than directed. Do not stop taking this medicine suddenly except upon the advice of your doctor. Stopping this medicine too quickly may cause serious side effects or your condition may worsen. A special MedGuide will be given to you by the pharmacist with each prescription and refill. Be sure to read this information carefully each time. Talk to your pediatrician regarding the use of this medicine in children. Special care may be needed. Overdosage: If you think you have taken too much of this  medicine contact a poison control center or emergency room at once. NOTE: This medicine is only for you. Do not share this medicine with others. What if I miss a dose? If you miss a dose, take it as soon as you can. If it is almost time for your next dose, take only that dose. Do not take double or extra doses. What may interact with this medicine? Do not take this medicine with any of the following medications:  certain medicines for fungal infections like fluconazole, itraconazole, ketoconazole, posaconazole, voriconazole  cisapride  dronedarone  linezolid  MAOIs like Carbex, Eldepryl, Marplan, Nardil, and Parnate  mesoridazine  methylene blue (injected into a vein)  pimozide  saquinavir  thioridazine This medicine may also interact with the following medications:  alcohol  antiviral medicines for HIV or AIDS  aspirin and aspirin-like medicines  barbiturates like phenobarbital  certain medicines for blood pressure, heart disease, irregular heart beat  certain medicines for depression, anxiety, or psychotic disturbances  certain medicines for migraine headache like almotriptan, eletriptan, frovatriptan, naratriptan, rizatriptan, sumatriptan, zolmitriptan  certain medicines for seizures like carbamazepine and phenytoin  certain medicines for sleep  certain medicines that treat or prevent blood clots like dalteparin, enoxaparin, warfarin  digoxin  fentanyl  lithium  NSAIDS, medicines for pain and inflammation, like ibuprofen or naproxen  other medicines that prolong the QT interval (cause an abnormal heart rhythm) like dofetilide  rasagiline  supplements like St. John's wort, kava kava, valerian  tramadol  tryptophan This list may not describe all possible interactions. Give your health care provider a list of all the medicines, herbs, non-prescription drugs, or dietary supplements you use. Also tell them if you smoke, drink alcohol, or use illegal  drugs. Some items may interact with your medicine. What should I watch for while using this medicine? Tell your doctor if your symptoms do not get better or if they get worse. Visit your doctor or health care professional for regular checks on your progress. Because it may take several weeks to see the full effects of this medicine, it is important to continue your treatment as prescribed by your doctor. Patients and their families should watch out for new or worsening thoughts of suicide or depression. Also watch out for sudden changes in feelings such as feeling anxious, agitated, panicky, irritable, hostile, aggressive, impulsive, severely restless, overly excited and hyperactive, or not being able to sleep. If this happens, especially at the beginning of treatment or after a change in dose, call your health care professional. Dennis Bast may get drowsy or dizzy. Do not drive, use machinery, or do anything that needs mental alertness until you know how this medicine affects you. Do not stand or sit up quickly, especially if you are an older patient. This reduces the risk of dizzy or fainting spells. Alcohol may interfere with the effect of this medicine. Avoid alcoholic drinks. This medicine may cause dry eyes and blurred vision. If you wear contact lenses  you may feel some discomfort. Lubricating drops may help. See your eye doctor if the problem does not go away or is severe. Your mouth may get dry. Chewing sugarless gum, sucking hard candy and drinking plenty of water may help. Contact your doctor if the problem does not go away or is severe. What side effects may I notice from receiving this medicine? Side effects that you should report to your doctor or health care professional as soon as possible:  allergic reactions like skin rash, itching or hives, swelling of the face, lips, or tongue  elevated mood, decreased need for sleep, racing thoughts, impulsive behavior  confusion  fast, irregular  heartbeat  feeling faint or lightheaded, falls  feeling agitated, angry, or irritable  loss of balance or coordination  painful or prolonged erections  restlessness, pacing, inability to keep still  suicidal thoughts or other mood changes  tremors  trouble sleeping  seizures  unusual bleeding or bruising Side effects that usually do not require medical attention (report to your doctor or health care professional if they continue or are bothersome):  change in sex drive or performance  change in appetite or weight  constipation  headache  muscle aches or pains  nausea This list may not describe all possible side effects. Call your doctor for medical advice about side effects. You may report side effects to FDA at 1-800-FDA-1088. Where should I keep my medicine? Keep out of the reach of children. Store at room temperature between 15 and 30 degrees C (59 to 86 degrees F). Protect from light. Keep container tightly closed. Throw away any unused medicine after the expiration date. NOTE: This sheet is a summary. It may not cover all possible information. If you have questions about this medicine, talk to your doctor, pharmacist, or health care provider.  2020 [employer]/Gold Standard (2018-01-24 11:46:46)

## 2018-11-24 LAB — CBC WITH DIFFERENTIAL/PLATELET
Absolute Monocytes: 757 cells/uL (ref 200–950)
Basophils Absolute: 17 cells/uL (ref 0–200)
Basophils Relative: 0.2 %
Eosinophils Absolute: 43 cells/uL (ref 15–500)
Eosinophils Relative: 0.5 %
HCT: 43.4 % (ref 35.0–45.0)
Hemoglobin: 14.5 g/dL (ref 11.7–15.5)
Lymphs Abs: 1797 cells/uL (ref 850–3900)
MCH: 31.3 pg (ref 27.0–33.0)
MCHC: 33.4 g/dL (ref 32.0–36.0)
MCV: 93.5 fL (ref 80.0–100.0)
MPV: 10.6 fL (ref 7.5–12.5)
Monocytes Relative: 8.8 %
Neutro Abs: 5986 cells/uL (ref 1500–7800)
Neutrophils Relative %: 69.6 %
Platelets: 298 10*3/uL (ref 140–400)
RBC: 4.64 10*6/uL (ref 3.80–5.10)
RDW: 12.4 % (ref 11.0–15.0)
Total Lymphocyte: 20.9 %
WBC: 8.6 10*3/uL (ref 3.8–10.8)

## 2018-11-24 LAB — TSH: TSH: 0.97 mIU/L (ref 0.40–4.50)

## 2018-11-24 LAB — COMPLETE METABOLIC PANEL WITH GFR
AG Ratio: 1.8 (calc) (ref 1.0–2.5)
ALT: 27 U/L (ref 6–29)
AST: 22 U/L (ref 10–35)
Albumin: 4.4 g/dL (ref 3.6–5.1)
Alkaline phosphatase (APISO): 85 U/L (ref 37–153)
BUN/Creatinine Ratio: 15 (calc) (ref 6–22)
BUN: 15 mg/dL (ref 7–25)
CO2: 25 mmol/L (ref 20–32)
Calcium: 9.8 mg/dL (ref 8.6–10.4)
Chloride: 104 mmol/L (ref 98–110)
Creat: 1.01 mg/dL — ABNORMAL HIGH (ref 0.50–0.99)
GFR, Est African American: 69 mL/min/{1.73_m2} (ref >=60)
GFR, Est Non African American: 59 mL/min/{1.73_m2} — ABNORMAL LOW (ref >=60)
Globulin: 2.4 g/dL (calc) (ref 1.9–3.7)
Glucose, Bld: 91 mg/dL (ref 65–99)
Potassium: 4.2 mmol/L (ref 3.5–5.3)
Sodium: 141 mmol/L (ref 135–146)
Total Bilirubin: 0.7 mg/dL (ref 0.2–1.2)
Total Protein: 6.8 g/dL (ref 6.1–8.1)

## 2018-11-24 LAB — SPECIMEN COMPROMISED

## 2018-11-24 LAB — LIPID PANEL
Cholesterol: 132 mg/dL (ref <200)
HDL: 68 mg/dL (ref >=50)
LDL Cholesterol (Calc): 49 mg/dL (calc)
Non-HDL Cholesterol (Calc): 64 mg/dL (calc) (ref <130)
Total CHOL/HDL Ratio: 1.9 (calc) (ref <5.0)
Triglycerides: 69 mg/dL (ref <150)

## 2018-11-24 LAB — MAGNESIUM: Magnesium: 2.3 mg/dL (ref 1.5–2.5)

## 2018-12-12 DIAGNOSIS — M545 Low back pain: Secondary | ICD-10-CM | POA: Diagnosis not present

## 2018-12-12 DIAGNOSIS — M4326 Fusion of spine, lumbar region: Secondary | ICD-10-CM | POA: Diagnosis not present

## 2018-12-12 DIAGNOSIS — M25512 Pain in left shoulder: Secondary | ICD-10-CM | POA: Diagnosis not present

## 2018-12-12 DIAGNOSIS — M791 Myalgia, unspecified site: Secondary | ICD-10-CM | POA: Diagnosis not present

## 2018-12-14 ENCOUNTER — Telehealth: Payer: Self-pay | Admitting: Nurse Practitioner

## 2018-12-14 ENCOUNTER — Other Ambulatory Visit: Payer: Self-pay | Admitting: Orthopaedic Surgery

## 2018-12-14 DIAGNOSIS — M4326 Fusion of spine, lumbar region: Secondary | ICD-10-CM

## 2018-12-14 NOTE — Telephone Encounter (Signed)
Phone call to patient to verify medication list and allergies for myelogram procedure. Pt instructed to hold Prozac for 48hrs prior to myelogram appointment time. Pt verbalized understanding. Pre and post procedure instructions reviewed with pt. 

## 2018-12-21 ENCOUNTER — Ambulatory Visit
Admission: RE | Admit: 2018-12-21 | Discharge: 2018-12-21 | Disposition: A | Discharge status: Home / Self Care | Payer: Medicare Other | Source: Ambulatory Visit | Attending: Orthopaedic Surgery | Admitting: Orthopaedic Surgery

## 2018-12-21 ENCOUNTER — Other Ambulatory Visit: Payer: Self-pay

## 2018-12-21 VITALS — BP 134/80 | HR 64

## 2018-12-21 DIAGNOSIS — M5136 Other intervertebral disc degeneration, lumbar region: Secondary | ICD-10-CM

## 2018-12-21 DIAGNOSIS — M5126 Other intervertebral disc displacement, lumbar region: Secondary | ICD-10-CM | POA: Diagnosis not present

## 2018-12-21 DIAGNOSIS — M4326 Fusion of spine, lumbar region: Secondary | ICD-10-CM

## 2018-12-21 DIAGNOSIS — M4316 Spondylolisthesis, lumbar region: Secondary | ICD-10-CM | POA: Diagnosis not present

## 2018-12-21 MED ORDER — DIAZEPAM 5 MG PO TABS: 5.0000 mg | ORAL_TABLET | Freq: Once | ORAL | Status: DC

## 2018-12-21 MED ORDER — IOPAMIDOL (ISOVUE-M 200) INJECTION 41%
18.0000 mL | Freq: Once | INTRAMUSCULAR | Status: AC
  Administered 2018-12-21: 12:00:00 18 mL via INTRATHECAL

## 2018-12-21 NOTE — Progress Notes (Signed)
Patient states she has been off Prozac for at least the past two days.

## 2018-12-21 NOTE — Discharge Instructions (Signed)
Myelogram Discharge Instructions  1. Go home and rest quietly for the next 24 hours.  It is important to lie flat for the next 24 hours.  Get up only to go to the restroom.  You may lie in the bed or on a couch on your back, your stomach, your left side or your right side.  You may have one pillow under your head.  You may have pillows between your knees while you are on your side or under your knees while you are on your back.  2. DO NOT drive today.  Recline the seat as far back as it will go, while still wearing your seat belt, on the way home.  3. You may get up to go to the bathroom as needed.  You may sit up for 10 minutes to eat.  You may resume your normal diet and medications unless otherwise indicated.  Drink lots of extra fluids today and tomorrow.  4. The incidence of headache, nausea, or vomiting is about 5% (one in 20 patients).  If you develop a headache, lie flat and drink plenty of fluids until the headache goes away.  Caffeinated beverages may be helpful.  If you develop severe nausea and vomiting or a headache that does not go away with flat bed rest, call [phone].  5. You may resume normal activities after your 24 hours of bed rest is over; however, do not exert yourself strongly or do any heavy lifting tomorrow. If when you get up you have a headache when standing, go back to bed and force fluids for another 24 hours.  6. Call your physician for a follow-up appointment.  The results of your myelogram will be sent directly to your physician by the following day.  7. If you have any questions or if complications develop after you arrive home, please call [phone].  Discharge instructions have been explained to the patient.  The patient, or the person responsible for the patient, fully understands these instructions.  YOU MAY RESTART YOUR PROZAC TOMORROW 12/22/2018 AT 11:00AM.

## 2019-01-18 DIAGNOSIS — G894 Chronic pain syndrome: Secondary | ICD-10-CM | POA: Diagnosis not present

## 2019-01-18 DIAGNOSIS — M4326 Fusion of spine, lumbar region: Secondary | ICD-10-CM | POA: Diagnosis not present

## 2019-01-18 DIAGNOSIS — Z79891 Long term (current) use of opiate analgesic: Secondary | ICD-10-CM | POA: Diagnosis not present

## 2019-02-20 DIAGNOSIS — M791 Myalgia, unspecified site: Secondary | ICD-10-CM | POA: Diagnosis not present

## 2019-02-20 DIAGNOSIS — M545 Low back pain: Secondary | ICD-10-CM | POA: Diagnosis not present

## 2019-02-20 DIAGNOSIS — M5106 Intervertebral disc disorders with myelopathy, lumbar region: Secondary | ICD-10-CM | POA: Diagnosis not present

## 2019-02-20 DIAGNOSIS — M4326 Fusion of spine, lumbar region: Secondary | ICD-10-CM | POA: Diagnosis not present

## 2019-02-28 ENCOUNTER — Encounter: Payer: Self-pay | Admitting: Internal Medicine

## 2019-02-28 NOTE — Progress Notes (Signed)
History of Present Illness:      This very nice 64 y.o. single WF presents for 6 month follow up with HTN, HLD, Pre-Diabetes and Vitamin D Deficiency. Patient  has COPD/ OSA overlap and is on CPAP for with reported improved sleep hygiene.         The patient is on SS Disabilityforchronic pain fromLumbar DDD & surg x 2 in 2004and and is followed in pain management.      Patient is treated for HTN (1998) & BP has been controlled at home. Today's BP is at goal - 116/80. Patient has had no complaints of any cardiac type chest pain, palpitations, dyspnea / orthopnea / PND, dizziness, claudication, or dependent edema.      Hyperlipidemia is controlled with diet & meds. Patient denies myalgias or other med SE's. Last Lipids were at goal:  Lab Results  Component Value Date   CHOL 132 11/23/2018   HDL 68 11/23/2018   LDLCALC 49 11/23/2018   TRIG 69 11/23/2018   CHOLHDL 1.9 11/23/2018        Also, the patient has history of  PreDiabetes (A1c 5.8% / 2010)  and has had no symptoms of reactive hypoglycemia, diabetic polys, paresthesias or visual blurring.  Last A1c was not at goal;  Lab Results  Component Value Date   HGBA1C 5.9 (H) 08/22/2018        Further, the patient also has history of Vitamin D Deficiency ("20" / 2008)  and supplements vitamin D without any suspected side-effects. Last vitamin D was at goal:  Lab Results  Component Value Date   VD25OH 82 08/22/2018    Current Outpatient Medications on File Prior to Visit  Medication Sig  . acetaminophen (TYLENOL 8 HOUR) 650 MG CR tablet Take by mouth. Takes 2 tablets daily.  . baclofen (LIORESAL) 20 MG tablet Take 20 mg by mouth 3 (three) times daily as needed for muscle spasms.  . cholecalciferol 5000 units TABS Take 2 tablets daily  . FLUoxetine (PROZAC) 40 MG capsule Take 1 capsule Daily for Mood  . ketotifen (ZADITOR) 0.025 % ophthalmic solution Place 1 drop into both eyes 3 (three) times daily as needed  (allergies).  Marland Kitchen oxyCODONE (ROXICODONE) 15 MG immediate release tablet Take 15 mg by mouth every 6 (six) hours as needed for pain.   . simvastatin (ZOCOR) 40 MG tablet Take 1 tablet at Bedtime for Cholesterol   No current facility-administered medications on file prior to visit.    Allergies  Allergen Reactions  . Augmentin [Amoxicillin-Pot Clavulanate] Nausea And Vomiting  . Codeine Nausea And Vomiting  . Wellbutrin [Bupropion]     Pt does not recall reaction   . Zoloft [Sertraline Hcl]     Pt does not recall reaction   . Lipitor [Atorvastatin] Other (See Comments)    Leg pain    PMHx:   Past Medical History:  Diagnosis Date  . Allergy   . Arthritis   . AVN (avascular necrosis of bone) (HCC)    Left shoulder  . COPD (chronic obstructive pulmonary disease) ([place])   . Depression   . Diabetes mellitus without complication (HCC)    Pre-diabetes.   Marland Kitchen GERD (gastroesophageal reflux disease)   . Hyperlipidemia   . Hypertension   . Memory disorder 12/23/2016  . TIA (transient ischemic attack) 09/30/2017  . Vitamin D deficiency    Immunization History  Administered Date(s) Administered  . Td 01/19/2004, 06/05/2015   Past Surgical  History:  Procedure Laterality Date  . ANKLE SURGERY Right   . BACK SURGERY    . BREAST EXCISIONAL BIOPSY Right   . CATARACT EXTRACTION Bilateral   . SPINE SURGERY     lumbar  . TONSILLECTOMY AND ADENOIDECTOMY      FHx:    Reviewed / unchanged  SHx:    Reviewed / unchanged   Systems Review:  Constitutional: Denies fever, chills, wt changes, headaches, insomnia, fatigue, night sweats, change in appetite. Eyes: Denies redness, blurred vision, diplopia, discharge, itchy, watery eyes.  ENT: Denies discharge, congestion, post nasal drip, epistaxis, sore throat, earache, hearing loss, dental pain, tinnitus, vertigo, sinus pain, snoring.  CV: Denies chest pain, palpitations, irregular heartbeat, syncope, dyspnea, diaphoresis, orthopnea, PND,  claudication or edema. Respiratory: denies cough, dyspnea, DOE, pleurisy, hoarseness, laryngitis, wheezing.  Gastrointestinal: Denies dysphagia, odynophagia, heartburn, reflux, water brash, abdominal pain or cramps, nausea, vomiting, bloating, diarrhea, constipation, hematemesis, melena, hematochezia  or hemorrhoids. Genitourinary: Denies dysuria, frequency, urgency, nocturia, hesitancy, discharge, hematuria or flank pain. Musculoskeletal: Denies arthralgias, myalgias, stiffness, jt. swelling, pain, limping or strain/sprain.  Skin: Denies pruritus, rash, hives, warts, acne, eczema or change in skin lesion(s). Neuro: No weakness, tremor, incoordination, spasms, paresthesia or pain. Psychiatric: Denies confusion, memory loss or sensory loss. Endo: Denies change in weight, skin or hair change.  Heme/Lymph: No excessive bleeding, bruising or enlarged lymph nodes.  Physical Exam  BP 116/80   Pulse 76   Temp (!) 97.1 F (36.2 C)   Resp 16   Ht 5\' 4"  (1.626 m)   Wt 155 lb 6.4 oz (70.5 kg)   BMI 26.67 kg/m   Appears  well nourished, well groomed  and in no distress.  Eyes: PERRLA, EOMs, conjunctiva no swelling or erythema. Sinuses: No frontal/maxillary tenderness ENT/Mouth: EAC's clear, TM's nl w/o erythema, bulging. Nares clear w/o erythema, swelling, exudates. Oropharynx clear without erythema or exudates. Oral hygiene is good. Tongue normal, non obstructing. Hearing intact.  Neck: Supple. Thyroid not palpable. Car 2+/2+ without bruits, nodes or JVD. Chest: Respirations nl with BS clear & equal w/o rales, rhonchi, wheezing or stridor.  Cor: Heart sounds normal w/ regular rate and rhythm without sig. murmurs, gallops, clicks or rubs. Peripheral pulses normal and equal  without edema.  Abdomen: Soft & bowel sounds normal. Non-tender w/o guarding, rebound, hernias, masses or organomegaly.  Lymphatics: Unremarkable.  Musculoskeletal: Full ROM all peripheral extremities, joint stability, 5/5  strength and normal gait.  Skin: Warm, dry without exposed rashes, lesions or ecchymosis apparent.  Neuro: Cranial nerves intact, reflexes equal bilaterally. Sensory-motor testing grossly intact. Tendon reflexes grossly intact.  Pysch: Alert & oriented x 3.  Insight and judgement nl & appropriate. No ideations.  Assessment and Plan:  1. Essential hypertension  - Continue medication, monitor blood pressure at home.  - Continue DASH diet.  Reminder to go to the ER if any CP,  SOB, nausea, dizziness, severe HA, changes vision/speech.  - CBC with Diff - COMPLETE METABOLIC PANEL WITH GFR - Magnesium - TSH  2. Hyperlipidemia, mixed  - Continue diet/meds, exercise,& lifestyle modifications.  - Continue monitor periodic cholesterol/liver & renal functions   - Lipid Profile - TSH  3. Abnormal glucose  - Continue diet, exercise  - Lifestyle modifications.  - Monitor appropriate labs.  - Hemoglobin A1c ([facility]) - Insulin, random  4. Vitamin D deficiency  - Continue supplementation.  - Vitamin D (25 hydroxy)  5. OSA and COPD overlap syndrome ([place])   6. Irritable bowel  syndrome, unspecified type  - dicyclomine 20 MG tablet; Take 1/2 to 1 tablet 3 x /day before  Meals & Bedtime for Abdominal Discomfort  Dispense: 270 tablet; Refill: 1  7. DDD (degenerative disc disease), lumbar   8. Depression, major, recurrent, in partial remission (HCC)  - buPROPion XL 300 MG 24 hr tablet; Take 1 tablet Daily for Mood,  Focus & Concentration.  Dispense: 90 tablet; Refill: 3  9. Medication management  - CBC with Diff - COMPLETE METABOLIC PANEL WITH GFR - Magnesium - Lipid Profile - TSH - Hemoglobin A1c ([facility]) - Insulin, random - Vitamin D (25 hydroxy)        Discussed  regular exercise, BP monitoring, weight control to achieve/maintain BMI less than 25 and discussed med and SE's. Recommended labs to assess and monitor clinical status with further disposition pending results  of labs.  I discussed the assessment and treatment plan with the patient. The patient was provided an opportunity to ask questions and all were answered. The patient agreed with the plan and demonstrated an understanding of the instructions.  I provided over 30 minutes of exam, counseling, chart review and  complex critical decision making.  Kirtland Bouchard, MD\

## 2019-02-28 NOTE — Patient Instructions (Signed)

## 2019-03-01 ENCOUNTER — Ambulatory Visit (INDEPENDENT_AMBULATORY_CARE_PROVIDER_SITE_OTHER): Payer: Medicare Other | Admitting: Internal Medicine

## 2019-03-01 ENCOUNTER — Ambulatory Visit: Payer: Medicare Other | Admitting: Internal Medicine

## 2019-03-01 ENCOUNTER — Other Ambulatory Visit: Payer: Self-pay

## 2019-03-01 VITALS — BP 116/80 | HR 76 | Temp 97.1°F | Resp 16 | Ht 64.0 in | Wt 155.4 lb

## 2019-03-01 DIAGNOSIS — Z79899 Other long term (current) drug therapy: Secondary | ICD-10-CM | POA: Diagnosis not present

## 2019-03-01 DIAGNOSIS — K589 Irritable bowel syndrome without diarrhea: Secondary | ICD-10-CM

## 2019-03-01 DIAGNOSIS — F3341 Major depressive disorder, recurrent, in partial remission: Secondary | ICD-10-CM

## 2019-03-01 DIAGNOSIS — I10 Essential (primary) hypertension: Secondary | ICD-10-CM | POA: Diagnosis not present

## 2019-03-01 DIAGNOSIS — R7309 Other abnormal glucose: Secondary | ICD-10-CM

## 2019-03-01 DIAGNOSIS — G4733 Obstructive sleep apnea (adult) (pediatric): Secondary | ICD-10-CM

## 2019-03-01 DIAGNOSIS — J449 Chronic obstructive pulmonary disease, unspecified: Secondary | ICD-10-CM

## 2019-03-01 DIAGNOSIS — E559 Vitamin D deficiency, unspecified: Secondary | ICD-10-CM

## 2019-03-01 DIAGNOSIS — M5136 Other intervertebral disc degeneration, lumbar region: Secondary | ICD-10-CM

## 2019-03-01 DIAGNOSIS — E782 Mixed hyperlipidemia: Secondary | ICD-10-CM

## 2019-03-01 MED ORDER — BUPROPION HCL ER (XL) 300 MG PO TB24: ORAL_TABLET | ORAL | 3 refills | Status: DC

## 2019-03-01 MED ORDER — DICYCLOMINE HCL 20 MG PO TABS: ORAL_TABLET | ORAL | 1 refills | Status: DC

## 2019-03-02 LAB — COMPLETE METABOLIC PANEL WITH GFR
AG Ratio: 1.9 (calc) (ref 1.0–2.5)
ALT: 23 U/L (ref 6–29)
AST: 22 U/L (ref 10–35)
Albumin: 4.3 g/dL (ref 3.6–5.1)
Alkaline phosphatase (APISO): 74 U/L (ref 37–153)
BUN: 9 mg/dL (ref 7–25)
CO2: 28 mmol/L (ref 20–32)
Calcium: 10.1 mg/dL (ref 8.6–10.4)
Chloride: 107 mmol/L (ref 98–110)
Creat: 0.77 mg/dL (ref 0.50–0.99)
GFR, Est African American: 95 mL/min/{1.73_m2} (ref >=60)
GFR, Est Non African American: 82 mL/min/{1.73_m2} (ref >=60)
Globulin: 2.3 g/dL (calc) (ref 1.9–3.7)
Glucose, Bld: 103 mg/dL — ABNORMAL HIGH (ref 65–99)
Potassium: 4.3 mmol/L (ref 3.5–5.3)
Sodium: 144 mmol/L (ref 135–146)
Total Bilirubin: 0.6 mg/dL (ref 0.2–1.2)
Total Protein: 6.6 g/dL (ref 6.1–8.1)

## 2019-03-02 LAB — LIPID PANEL
Cholesterol: 146 mg/dL (ref <200)
HDL: 65 mg/dL (ref >=50)
LDL Cholesterol (Calc): 61 mg/dL (calc)
Non-HDL Cholesterol (Calc): 81 mg/dL (calc) (ref <130)
Total CHOL/HDL Ratio: 2.2 (calc) (ref <5.0)
Triglycerides: 116 mg/dL (ref <150)

## 2019-03-02 LAB — HEMOGLOBIN A1C
Hgb A1c MFr Bld: 5.7 % of total Hgb — ABNORMAL HIGH (ref <5.7)
Mean Plasma Glucose: 117 (calc)
eAG (mmol/L): 6.5 (calc)

## 2019-03-02 LAB — CBC WITH DIFFERENTIAL/PLATELET
Absolute Monocytes: 442 cells/uL (ref 200–950)
Basophils Absolute: 27 cells/uL (ref 0–200)
Basophils Relative: 0.4 %
Eosinophils Absolute: 107 cells/uL (ref 15–500)
Eosinophils Relative: 1.6 %
HCT: 39.9 % (ref 35.0–45.0)
Hemoglobin: 13.2 g/dL (ref 11.7–15.5)
Lymphs Abs: 1796 cells/uL (ref 850–3900)
MCH: 30.7 pg (ref 27.0–33.0)
MCHC: 33.1 g/dL (ref 32.0–36.0)
MCV: 92.8 fL (ref 80.0–100.0)
MPV: 10.9 fL (ref 7.5–12.5)
Monocytes Relative: 6.6 %
Neutro Abs: 4328 cells/uL (ref 1500–7800)
Neutrophils Relative %: 64.6 %
Platelets: 274 10*3/uL (ref 140–400)
RBC: 4.3 10*6/uL (ref 3.80–5.10)
RDW: 12.1 % (ref 11.0–15.0)
Total Lymphocyte: 26.8 %
WBC: 6.7 10*3/uL (ref 3.8–10.8)

## 2019-03-02 LAB — TSH: TSH: 0.89 mIU/L (ref 0.40–4.50)

## 2019-03-02 LAB — MAGNESIUM: Magnesium: 2.1 mg/dL (ref 1.5–2.5)

## 2019-03-02 LAB — VITAMIN D 25 HYDROXY (VIT D DEFICIENCY, FRACTURES): Vit D, 25-Hydroxy: 80 ng/mL (ref 30–100)

## 2019-03-02 LAB — INSULIN, RANDOM: Insulin: 25.6 u[IU]/mL — ABNORMAL HIGH

## 2019-03-08 DIAGNOSIS — H43812 Vitreous degeneration, left eye: Secondary | ICD-10-CM | POA: Diagnosis not present

## 2019-03-08 DIAGNOSIS — Z961 Presence of intraocular lens: Secondary | ICD-10-CM | POA: Diagnosis not present

## 2019-03-08 DIAGNOSIS — H524 Presbyopia: Secondary | ICD-10-CM | POA: Diagnosis not present

## 2019-03-08 DIAGNOSIS — H52203 Unspecified astigmatism, bilateral: Secondary | ICD-10-CM | POA: Diagnosis not present

## 2019-03-21 DIAGNOSIS — M4326 Fusion of spine, lumbar region: Secondary | ICD-10-CM | POA: Diagnosis not present

## 2019-03-21 DIAGNOSIS — M5106 Intervertebral disc disorders with myelopathy, lumbar region: Secondary | ICD-10-CM | POA: Diagnosis not present

## 2019-03-21 DIAGNOSIS — M791 Myalgia, unspecified site: Secondary | ICD-10-CM | POA: Diagnosis not present

## 2019-03-21 DIAGNOSIS — M545 Low back pain: Secondary | ICD-10-CM | POA: Diagnosis not present

## 2019-04-19 DIAGNOSIS — M545 Low back pain: Secondary | ICD-10-CM | POA: Diagnosis not present

## 2019-04-19 DIAGNOSIS — M4326 Fusion of spine, lumbar region: Secondary | ICD-10-CM | POA: Diagnosis not present

## 2019-04-19 DIAGNOSIS — M791 Myalgia, unspecified site: Secondary | ICD-10-CM | POA: Diagnosis not present

## 2019-04-19 DIAGNOSIS — M5106 Intervertebral disc disorders with myelopathy, lumbar region: Secondary | ICD-10-CM | POA: Diagnosis not present

## 2019-05-23 ENCOUNTER — Ambulatory Visit: Payer: Medicare Other | Admitting: Physician Assistant

## 2019-05-24 DIAGNOSIS — M4716 Other spondylosis with myelopathy, lumbar region: Secondary | ICD-10-CM | POA: Diagnosis not present

## 2019-05-24 DIAGNOSIS — M4326 Fusion of spine, lumbar region: Secondary | ICD-10-CM | POA: Diagnosis not present

## 2019-05-24 DIAGNOSIS — M961 Postlaminectomy syndrome, not elsewhere classified: Secondary | ICD-10-CM | POA: Diagnosis not present

## 2019-05-25 ENCOUNTER — Other Ambulatory Visit: Payer: Self-pay

## 2019-05-25 ENCOUNTER — Emergency Department (HOSPITAL_COMMUNITY): Payer: Medicare Other

## 2019-05-25 ENCOUNTER — Encounter (HOSPITAL_COMMUNITY): Payer: Self-pay

## 2019-05-25 ENCOUNTER — Emergency Department (HOSPITAL_COMMUNITY)
Admission: EM | Admit: 2019-05-25 | Discharge: 2019-05-25 | Disposition: A | Discharge status: Home / Self Care | Payer: Medicare Other | Attending: Emergency Medicine | Admitting: Emergency Medicine

## 2019-05-25 DIAGNOSIS — J449 Chronic obstructive pulmonary disease, unspecified: Secondary | ICD-10-CM | POA: Insufficient documentation

## 2019-05-25 DIAGNOSIS — Z79899 Other long term (current) drug therapy: Secondary | ICD-10-CM | POA: Diagnosis not present

## 2019-05-25 DIAGNOSIS — N182 Chronic kidney disease, stage 2 (mild): Secondary | ICD-10-CM | POA: Diagnosis not present

## 2019-05-25 DIAGNOSIS — W01198A Fall on same level from slipping, tripping and stumbling with subsequent striking against other object, initial encounter: Secondary | ICD-10-CM | POA: Insufficient documentation

## 2019-05-25 DIAGNOSIS — Y929 Unspecified place or not applicable: Secondary | ICD-10-CM | POA: Insufficient documentation

## 2019-05-25 DIAGNOSIS — Y999 Unspecified external cause status: Secondary | ICD-10-CM | POA: Diagnosis not present

## 2019-05-25 DIAGNOSIS — M545 Low back pain: Secondary | ICD-10-CM | POA: Diagnosis not present

## 2019-05-25 DIAGNOSIS — S3992XA Unspecified injury of lower back, initial encounter: Secondary | ICD-10-CM | POA: Diagnosis present

## 2019-05-25 DIAGNOSIS — I129 Hypertensive chronic kidney disease with stage 1 through stage 4 chronic kidney disease, or unspecified chronic kidney disease: Secondary | ICD-10-CM | POA: Diagnosis not present

## 2019-05-25 DIAGNOSIS — S39012A Strain of muscle, fascia and tendon of lower back, initial encounter: Secondary | ICD-10-CM

## 2019-05-25 DIAGNOSIS — Z87891 Personal history of nicotine dependence: Secondary | ICD-10-CM | POA: Insufficient documentation

## 2019-05-25 DIAGNOSIS — E1122 Type 2 diabetes mellitus with diabetic chronic kidney disease: Secondary | ICD-10-CM | POA: Insufficient documentation

## 2019-05-25 DIAGNOSIS — Z8673 Personal history of transient ischemic attack (TIA), and cerebral infarction without residual deficits: Secondary | ICD-10-CM | POA: Diagnosis not present

## 2019-05-25 DIAGNOSIS — Y939 Activity, unspecified: Secondary | ICD-10-CM | POA: Diagnosis not present

## 2019-05-25 DIAGNOSIS — M546 Pain in thoracic spine: Secondary | ICD-10-CM | POA: Diagnosis not present

## 2019-05-25 DIAGNOSIS — T148XXA Other injury of unspecified body region, initial encounter: Secondary | ICD-10-CM | POA: Diagnosis not present

## 2019-05-25 LAB — URINALYSIS, ROUTINE W REFLEX MICROSCOPIC
Bilirubin Urine: NEGATIVE
Glucose, UA: NEGATIVE mg/dL
Hgb urine dipstick: NEGATIVE
Ketones, ur: 80 mg/dL — AB
Leukocytes,Ua: NEGATIVE
Nitrite: NEGATIVE
Protein, ur: NEGATIVE mg/dL
Specific Gravity, Urine: 1.015 (ref 1.005–1.030)
pH: 5 (ref 5.0–8.0)

## 2019-05-25 MED ORDER — KETOROLAC TROMETHAMINE 30 MG/ML IJ SOLN
30.0000 mg | Freq: Once | INTRAMUSCULAR | Status: AC
  Administered 2019-05-25: 30 mg via INTRAMUSCULAR
  Filled 2019-05-25: qty 1

## 2019-05-25 MED ORDER — CELECOXIB 200 MG PO CAPS: 200.0000 mg | ORAL_CAPSULE | Freq: Two times a day (BID) | ORAL | 0 refills | Status: DC

## 2019-05-25 MED ORDER — KETOROLAC TROMETHAMINE 30 MG/ML IJ SOLN: 30.0000 mg | Freq: Once | INTRAMUSCULAR | Status: DC

## 2019-05-25 NOTE — Discharge Instructions (Addendum)
Get help right away if: Your back pain is severe. You are unable to stand or walk. You develop pain in your legs. You develop weakness in your buttocks or legs. You have difficulty controlling when you urinate or when you have a bowel movement. You have frequent, painful, or bloody urination. You have a temperature over 101.32F (38.3C)

## 2019-05-25 NOTE — ED Notes (Signed)
Patient verbalizes understanding of discharge instructions. Opportunity for questioning and answers were provided. Armband removed by staff, pt discharged from ED via wheelchair.  

## 2019-05-25 NOTE — ED Notes (Signed)
This RN walked pt at bedside, pt reports decrease in pain. Able to walk multiple steps with minimal assistance. States, "wow I don't even need my walker." Notified PA.

## 2019-05-25 NOTE — ED Triage Notes (Signed)
Pt had mechanical fall last night, c.o lower back pain, hx of back surgery. Pt ambulatory.

## 2019-05-25 NOTE — ED Provider Notes (Signed)
[facility] EMERGENCY DEPARTMENT Provider Note   CSN: [id number] Arrival date & time: 05/25/19  1335     History Chief Complaint  Patient presents with  . Fall  . Back Pain    Amber Hickman is a 64 y.o. female who presents with fall. She fell at 2:00 AM this morning. She fell in the dark and hit her back against a paper shredder. She has a hx of chronic back pain and is taking 50 mg oxycodone 4 times daily per review of PDMP and is also on chronic baclofen.  Patient states that she was able to move around 7 AM but went back to sleep when she got up she was unable to move from her bed and had to call family for help.  She attributes this to back spasm.  She states that her normal medications are not helping her.  She denies red flag symptoms of lower extremity weakness, saddle anesthesia, loss of bowel or bladder continence.  She denies fevers, chills, recent procedures to her back, urinary symptoms. HPI     Past Medical History:  Diagnosis Date  . Allergy   . Arthritis   . AVN (avascular necrosis of bone) (HCC)    Left shoulder  . COPD (chronic obstructive pulmonary disease) ([place])   . Depression   . Diabetes mellitus without complication (HCC)    Pre-diabetes.   Marland Kitchen GERD (gastroesophageal reflux disease)   . Hyperlipidemia   . Hypertension   . Memory disorder 12/23/2016  . TIA (transient ischemic attack) 09/30/2017  . Vitamin D deficiency     Patient Active Problem List   Diagnosis Date Noted  . Abnormal glucose 02/15/2018  . Former smoker 02/15/2018  . Type 2 diabetes mellitus ([place]) 09/21/2017  . Aortic atherosclerosis ([place]) 09/09/2017  . OSA and COPD overlap syndrome ([place]) 10/13/2016  . Depression, major, recurrent, in partial remission ([place]) 10/12/2016  . HTN (hypertension) 06/05/2015  . DDD, lumbar 05/21/2014  . Chronic pain syndrome 05/21/2014  . CKD (chronic kidney disease) stage 2, GFR 60-89 ml/min 02/11/2014  . Medication management  11/01/2013  . COPD (chronic obstructive pulmonary disease) ([place]) 04/27/2013  . Hyperlipidemia, mixed   . Vitamin D deficiency     Past Surgical History:  Procedure Laterality Date  . ANKLE SURGERY Right   . BACK SURGERY    . BREAST EXCISIONAL BIOPSY Right   . CATARACT EXTRACTION Bilateral   . SPINE SURGERY     lumbar  . TONSILLECTOMY AND ADENOIDECTOMY       OB History   No obstetric history on file.     Family History  Problem Relation Age of Onset  . Hypertension Mother   . COPD Mother   . Stroke Mother   . Cancer Mother        breast  . Heart disease Father        Age 92, MI  . Breast cancer Neg Hx     Social History   Tobacco Use  . Smoking status: Former Smoker    Packs/day: 1.00    Years: 30.00    Pack years: 30.00    Quit date: 05/10/2017    Years since quitting: 2.0  . Smokeless tobacco: Never Used  Substance Use Topics  . Alcohol use: No  . Drug use: No    Home Medications Prior to Admission medications   Medication Sig Start Date End Date Taking? Authorizing Provider  acetaminophen (TYLENOL 8 HOUR) 650 MG CR tablet  Take by mouth. Takes 2 tablets daily.    [provider]  baclofen (LIORESAL) 20 MG tablet Take 20 mg by mouth 3 (three) times daily as needed for muscle spasms.    [provider]  buPROPion (WELLBUTRIN XL) 300 MG 24 hr tablet Take 1 tablet Daily for Mood, Focus & Concentration. 03/01/19   Unk Pinto, MD  cholecalciferol 5000 units TABS Take 2 tablets daily 01/19/17   Unk Pinto, MD  dicyclomine (BENTYL) 20 MG tablet Take 1/2 to 1 tablet 3 x /day before Meals & Bedtime for Abdominal Discomfort 03/01/19   Unk Pinto, MD  FLUoxetine (PROZAC) 40 MG capsule Take 1 capsule Daily for Mood 10/12/18   Unk Pinto, MD  ketotifen (ZADITOR) 0.025 % ophthalmic solution Place 1 drop into both eyes 3 (three) times daily as needed (allergies).    [provider]  oxyCODONE (ROXICODONE) 15 MG immediate  release tablet Take 15 mg by mouth every 6 (six) hours as needed for pain.  07/04/17   [provider]  simvastatin (ZOCOR) 40 MG tablet Take 1 tablet at Bedtime for Cholesterol 10/03/18   Unk Pinto, MD    Allergies    Augmentin [amoxicillin-pot clavulanate], Codeine, Zoloft [sertraline hcl], and Lipitor [atorvastatin]  Review of Systems   Review of Systems Ten systems reviewed and are negative for acute change, except as noted in the HPI.   Physical Exam Updated Vital Signs BP (!) 143/81 (BP Location: Right Arm)   Pulse 76   Temp 99.1 F (37.3 C) (Oral)   Resp 16   SpO2 100%   Physical Exam Vitals and nursing note reviewed.  Constitutional:      General: She is not in acute distress.    Appearance: She is well-developed. She is not diaphoretic.  HENT:     Head: Normocephalic and atraumatic.  Eyes:     General: No scleral icterus.    Conjunctiva/sclera: Conjunctivae normal.  Cardiovascular:     Rate and Rhythm: Normal rate and regular rhythm.     Heart sounds: Normal heart sounds. No murmur. No friction rub. No gallop.   Pulmonary:     Effort: Pulmonary effort is normal. No respiratory distress.     Breath sounds: Normal breath sounds.  Abdominal:     General: Bowel sounds are normal. There is no distension.     Palpations: Abdomen is soft. There is no mass.     Tenderness: There is no abdominal tenderness. There is no guarding.  Musculoskeletal:     Cervical back: Normal range of motion.     Comments: No midline spinal tenderness.  Bruise over the right flank.  Tender to palpation. Normal strength the bilateral lower extremities, no pain with passive range of motion of the hips knees or ankles.  N VI.  Pelvis is stable to pressure, no abdominal tenderness.  Skin:    General: Skin is warm and dry.  Neurological:     Mental Status: She is alert and oriented to person, place, and time.  Psychiatric:        Behavior: Behavior normal.     ED Results /  Procedures / Treatments   Labs (all labs ordered are listed, but only abnormal results are displayed) Labs Reviewed - No data to display  EKG None  Radiology DG Lumbar Spine Complete  Result Date: 05/25/2019 CLINICAL DATA:  Low back pain after a fall yesterday. History of prior lumbar surgery. Initial encounter. EXAM: LUMBAR SPINE - COMPLETE 4+ VIEW COMPARISON:  Postmyelogram CT scan lumbar spine 12/21/2018. FINDINGS: There is no fracture. Trace retrolisthesis L2 on L3 is unchanged. The patient is status post L3-S1 fusion. Loss of disc space height and endplate spurring at [id number] and L2-3 are stable in appearance. Paraspinous structures demonstrate atherosclerosis. IMPRESSION: No acute abnormality. Degenerative disease L1-2 and L2-3. Status post L3-S1 fusion. Atherosclerosis. Electronically Signed   By: Inge Rise M.D.   On: 05/25/2019 14:34    Procedures Procedures (including critical care time)  Medications Ordered in ED Medications - No data to display  ED Course  I have reviewed the triage vital signs and the nursing notes.  Pertinent labs & imaging results that were available during my care of the patient were reviewed by me and considered in my medical decision making (see chart for details).    MDM Rules/Calculators/A&P                      64 year old female with a history of chronic pain syndrome on 15 mg of oxycodone 4 times daily and baclofen who presents after mechanical fall.  She has bruising to the right flank.  I personally ordered and reviewed the patient's urinalysis which shows no evidence of hematuria.  I personally reviewed the patient's lumbar plain film images which show no acute abnormalities and change of the patient's hardware.  The patient has right flank bruising but states she is afraid that she may have ruptured her spleen in the fall.  I assured the patient that her spleen is on the opposite side of her body and I have low suspicion that this occurred.   I also have very low suspicion that she has injury to the retroperitoneal structures.  I supported this diagnosis by looking at the patient's urine which shows no evidence of hematuria.  Patient is very anxious about her pain however after giving her a shot of Toradol feels incredibly better and is able to ambulate in the emergency department without assistive devices.  Patient is not taking any anti-inflammatories.  She is on SSRIs.  I have ordered the patient Celebrex which should not interact with her SSRIs.  Hopefully this will help with her pain complaint today.  She has no red flag symptoms.  I have encouraged the patient to follow closely with her PCP and her back surgeon if she is not improved by Monday she may need further imaging however she does not need emergent MRI in the emergency department.  The patient appears appropriate for discharge at this time.  I discussed return precautions.  Final Clinical Impression(s) / ED Diagnoses Final diagnoses:  None    Rx / DC Orders ED Discharge Orders    None       Margarita Mail, PA-C 05/25/19 2127    Truddie Hidden, MD 05/26/19 1011

## 2019-05-30 NOTE — Progress Notes (Signed)
MEDICARE ANNUAL WELLNESS VISIT AND 3 FOLLOW UP Assessment:    Encounter for Medicare annual wellness exam Yearly  Essential hypertension - continue medications, DASH diet, exercise and monitor at home. Call if greater than 130/80.  -     CBC with Differential/Platelet -     BASIC METABOLIC PANEL WITH GFR -     Hepatic function panel -     TSH  Tortuous aorta (HCC) Atherosclerosis of aorta (HCC) Control blood pressure, lipids and glucose Disscused lifestyle modifications, diet & exercise Continue to monitor  Chronic obstructive pulmonary disease, unspecified COPD type (HCC) No medications at this time No longer smoking Continue to monitor    OSA and COPD overlap syndrome (HCC) Has CPAP Discussed using regularly  CKD (chronic kidney disease) stage 2, GFR 60-89 ml/min Increase fluids  Avoid NSAIDS Blood pressure control Monitor sugars  Will continue to monitor -     BASIC METABOLIC PANEL WITH GFR  Depression, major, recurrent, in partial remission ([place]) Previously taking wellbutrin 300mg  daily, now taking prozac 40mg  daily. Discussed stress management techniques  Discussed, increase water,intake & good sleep hygiene  Discussed increasing exercise & vegetables in diet  Chronic pain syndrome DDD, lumbar Continue ortho follow up, Cohen  Strain of lumbar region Rx Tizanidine 4mg  TID PRN Discussed topical preparations ie diclofenac gel  Former Tobacco use disorder Still not smoking Encouraged healthy behaviors  Other abnormal glucose Discussed dietary and exercise modifications  Mixed hyperlipidemia Continue medications: Simvastatin 40mg  nightly Discussed dietary and exercise modifications Low fat diet  Vitamin D deficiency Continue supplementation Taking Vitamin D 5,000 IU daily -     VITAMIN D 25 Hydroxy (Vit-D Deficiency, Fractures)  Medication management Continued  History of TIA Continue follow up with Dr. Jannifer Franklin  Need for Hepatitis C  Screening -Hep C antibody  B12 deficiency -Vitamin B12  BMI 26.0-26.9 Discussed dietary and exercise modifications   Over 40 minutes of face to face interview, exam, counseling, chart review, and critical decision making was performed  Future Appointments  Date Time Provider [place]  09/03/2019  2:00 PM Unk Pinto, MD [facility] None     Plan:   During the course of the visit the patient was educated and counseled about appropriate screening and preventive services including:    Pneumococcal vaccine   Influenza vaccine  Prevnar 13  Td vaccine  Screening electrocardiogram  Colorectal cancer screening  Diabetes screening  Glaucoma screening  Nutrition counseling    Subjective:  Amber Hickman is a 64 y.o. female who presents for Medicare Annual Wellness Visit and 3 month follow up for HTN, HLD prediabetes, and vitamin D Def.   Her blood pressure has been controlled at home, today their BP is BP: 122/80 She does not workout. She denies chest pain, shortness of breath, dizziness. She has OSA and is not on CPAP, she is a smoker. She no longer uses this and felt it was not helping. She had a normal echo 04/2015, normal stress test 04/2015. Ct lung 07/14/2016.       She had just had a follow up with Dr Patrice Paradise on Thursday 05/24/19.  She reports she woke in the middle of the night to use the bathroom and did not turn on the light.  She hit the corn of the bed with her knees and reach down to steady herself as she walked around the corner and fell.  She really wasn't sure how it happened.  She fell backward on the shredder she has  in the room.  She reports she made it thrpogh the night byut had to call her daughter to come open the door to her house.  She reports she could not get out of bed.  She was transported to ER.  She had an injection of Toradol which helped.  She started Celebrex and taking this daily.  She is taking baclofen, which she does not  feel like this has been helping.  Fused L3 down. 06/23/19 is follow up with Dr Patrice Paradise.    BMI is Body mass index is 26.95 kg/m., she is working on diet and exercise. Wt Readings from Last 3 Encounters:  05/31/19 157 lb (71.2 kg)  03/01/19 155 lb 6.4 oz (70.5 kg)  11/23/18 153 lb (69.4 kg)  ' She sees pain management for her back pain.  They are prescribing her pain medications. Had back surgery with Dr. Patrice Paradise last April, still has some right leg issues and weakness.    She is on cholesterol medication and denies myalgias. Her cholesterol is at goal. The cholesterol last visit was:   Lab Results  Component Value Date   CHOL 146 03/01/2019   HDL 65 03/01/2019   LDLCALC 61 03/01/2019   TRIG 116 03/01/2019   CHOLHDL 2.2 03/01/2019   :  Lab Results  Component Value Date   HGBA1C 5.7 (H) 03/01/2019   Last GFR Lab Results  Component Value Date   GFRNONAA 82 03/01/2019    Patient is on Vitamin D supplement.   Lab Results  Component Value Date   VD25OH 80 03/01/2019      Medication Review: Current Outpatient Medications on File Prior to Visit  Medication Sig Dispense Refill  . acetaminophen (TYLENOL 8 HOUR) 650 MG CR tablet Take by mouth. Takes 2 tablets daily.    . baclofen (LIORESAL) 20 MG tablet Take 20 mg by mouth 3 (three) times daily as needed for muscle spasms.    . celecoxib (CELEBREX) 200 MG capsule Take 1 capsule (200 mg total) by mouth 2 (two) times daily. 20 capsule 0  . cholecalciferol 5000 units TABS Take 2 tablets daily    . FLUoxetine (PROZAC) 40 MG capsule Take 1 capsule Daily for Mood 90 capsule 3  . ketotifen (ZADITOR) 0.025 % ophthalmic solution Place 1 drop into both eyes 3 (three) times daily as needed (allergies).    Marland Kitchen oxyCODONE (ROXICODONE) 15 MG immediate release tablet Take 15 mg by mouth every 6 (six) hours as needed for pain.   0  . simvastatin (ZOCOR) 40 MG tablet Take 1 tablet at Bedtime for Cholesterol 90 tablet 3   No current  facility-administered medications on file prior to visit.    Allergies: Allergies  Allergen Reactions  . Augmentin [Amoxicillin-Pot Clavulanate] Nausea And Vomiting  . Codeine Nausea And Vomiting  . Zoloft [Sertraline Hcl]     Pt does not recall reaction   . Lipitor [Atorvastatin] Other (See Comments)    Leg pain    Current Problems (verified) has Hyperlipidemia, mixed; Vitamin D deficiency; COPD (chronic obstructive pulmonary disease) ([place]); Medication management; CKD (chronic kidney disease) stage 2, GFR 60-89 ml/min; DDD, lumbar; Chronic pain syndrome; HTN (hypertension); Depression, major, recurrent, in partial remission ([place]); OSA and COPD overlap syndrome ([place]); Aortic atherosclerosis ([place]); Abnormal glucose; Former smoker; and Type 2 diabetes mellitus ([place]) on their problem list.  Screening Tests Immunization History  Administered Date(s) Administered  . Td 01/19/2004, 06/05/2015   Preventative care: Last colonoscopy: 09/2017 Mammogram 05/2017, Due Echo 2017  Stress test 2017 Ct lung 08/2017 PAP 2011 declines CTA head and neck 09/2017- normal  Prior vaccinations: TD or Tdap 2017  Influenza: Declined  Pneumococcal: Due, but declined Prevnar13: Declined Shingles/Zostavax: Declined COVID-19 - SARS2   Names of Other Physician/Practitioners you currently use: 1. [facility] here for primary care 2. Dr. Katy Fitch, eye doctor, last visit 2020 3. Dr.Sarfan , dentist, last visit Jan 2020 Patient Care Team: Unk Pinto, MD as PCP - General (Internal Medicine) Richmond Campbell, MD as Consulting Physician (Gastroenterology) Vicie Mutters, PA-C as Referring Physician (Physician Assistant)  Surgical: She  has a past surgical history that includes Cataract extraction (Bilateral); Spine surgery; Ankle surgery (Right); Tonsillectomy and adenoidectomy; Breast excisional biopsy (Right); and Back surgery. Family Her family history includes  COPD in her mother; Cancer in her mother; Heart disease in her father; Hypertension in her mother; Stroke in her mother. Social history  She reports that she quit smoking about 2 years ago. She has a 30.00 pack-year smoking history. She has never used smokeless tobacco. She reports that she does not drink alcohol or use drugs.  MEDICARE WELLNESS OBJECTIVES: Physical activity: Current Exercise Habits: The patient does not participate in regular exercise at present, Exercise limited by: orthopedic condition(s) Cardiac risk factors: Cardiac Risk Factors include: advanced age (>73men, >57 women);dyslipidemia;hypertension;obesity (BMI >30kg/m2);sedentary lifestyle Depression/mood screen:   Depression screen [facility] 2/9 05/31/2019  Decreased Interest 0  Down, Depressed, Hopeless 0  PHQ - 2 Score 0  Altered sleeping -  Tired, decreased energy -  Change in appetite -  Feeling bad or failure about yourself  -  Trouble concentrating -  Moving slowly or fidgety/restless -  Suicidal thoughts -  PHQ-9 Score -  Difficult doing work/chores -    ADLs:  In your present state of health, do you have any difficulty performing the following activities: 05/31/2019 02/28/2019  Hearing? N N  Vision? N N  Difficulty concentrating or making decisions? N N  Walking or climbing stairs? Y N  Comment Back pain -  Dressing or bathing? N N  Doing errands, shopping? N N  Preparing Food and eating ? N -  Using the Toilet? N -  In the past six months, have you accidently leaked urine? N -  Do you have problems with loss of bowel control? N -  Managing your Medications? N -  Managing your Finances? N -  Housekeeping or managing your Housekeeping? N -  Some recent data might be hidden     Cognitive Testing  Alert? Yes  Normal Appearance?Yes  Oriented to person? Yes  Place? Yes   Time? Yes  Recall of three objects?  Yes  Can perform simple calculations? Yes  Displays appropriate judgment?Yes  Can read the correct  time from a watch face?Yes  EOL planning: Does Patient Have a Medical Advance Directive?: Yes Type of Advance Directive: Healthcare Power of Attorney, Living will [place] in Chart?: No - copy requested Would patient like information on creating a medical advance directive?: No - Patient declined   Objective:   Today's Vitals   05/31/19 1113  BP: 122/80  Pulse: 92  Temp: 97.6 F (36.4 C)  SpO2: 98%  Weight: 157 lb (71.2 kg)  Height: 5\' 4"  (1.626 m)   Body mass index is 26.95 kg/m.  General appearance: alert, no distress, WD/WN, female HEENT: normocephalic, sclerae anicteric, TMs pearly, nares patent, no discharge or erythema, pharynx normal Oral cavity: MMM,  no lesions Neck: supple, no lymphadenopathy, no thyromegaly, no masses Heart: RRR, normal S1, S2, no murmurs Lungs: CTA bilaterally, no wheezes, rhonchi, or rales Abdomen: +bs, soft, non tender, non distended, no masses, no hepatomegaly, no splenomegaly Musculoskeletal: nontender, no swelling, no obvious deformity.  Tenderness to bilateral lumbar, no CVA tenderness noted. Extremities: no edema, no cyanosis, no clubbing, right leg with some atrophy and decrease muscle compared to left.  Pulses: 2+ symmetric, upper and lower extremities, normal cap refill Neurological: alert, oriented x 3, CN2-12 intact, strength normal upper extremities and lower extremities, sensation normal throughout, DTRs 2+ throughout, no cerebellar signs, gait normal Psychiatric: normal affect, behavior normal, pleasant   Medicare Attestation I have personally reviewed: The patient's medical and social history Their use of alcohol, tobacco or illicit drugs Their current medications and supplements The patient's functional ability including ADLs,fall risks, home safety risks, cognitive, and hearing and visual impairment Diet and physical activities Evidence for depression or mood disorders  The patient's weight, height,  BMI, and visual acuity have been recorded in the chart.  I have made referrals, counseling, and provided education to the patient based on review of the above and I have provided the patient with a written personalized care plan for preventive services.     Garnet Sierras, NP   05/31/2019

## 2019-05-31 ENCOUNTER — Ambulatory Visit (INDEPENDENT_AMBULATORY_CARE_PROVIDER_SITE_OTHER): Payer: Medicare Other | Admitting: Adult Health Nurse Practitioner

## 2019-05-31 ENCOUNTER — Other Ambulatory Visit: Payer: Self-pay

## 2019-05-31 ENCOUNTER — Encounter: Payer: Self-pay | Admitting: Adult Health Nurse Practitioner

## 2019-05-31 VITALS — BP 122/80 | HR 92 | Temp 97.6°F | Ht 64.0 in | Wt 157.0 lb

## 2019-05-31 DIAGNOSIS — I10 Essential (primary) hypertension: Secondary | ICD-10-CM | POA: Diagnosis not present

## 2019-05-31 DIAGNOSIS — Z8673 Personal history of transient ischemic attack (TIA), and cerebral infarction without residual deficits: Secondary | ICD-10-CM

## 2019-05-31 DIAGNOSIS — K589 Irritable bowel syndrome without diarrhea: Secondary | ICD-10-CM

## 2019-05-31 DIAGNOSIS — Z0001 Encounter for general adult medical examination with abnormal findings: Secondary | ICD-10-CM | POA: Diagnosis not present

## 2019-05-31 DIAGNOSIS — R6889 Other general symptoms and signs: Secondary | ICD-10-CM

## 2019-05-31 DIAGNOSIS — E538 Deficiency of other specified B group vitamins: Secondary | ICD-10-CM | POA: Diagnosis not present

## 2019-05-31 DIAGNOSIS — Z6826 Body mass index (BMI) 26.0-26.9, adult: Secondary | ICD-10-CM

## 2019-05-31 DIAGNOSIS — E559 Vitamin D deficiency, unspecified: Secondary | ICD-10-CM | POA: Diagnosis not present

## 2019-05-31 DIAGNOSIS — N182 Chronic kidney disease, stage 2 (mild): Secondary | ICD-10-CM | POA: Diagnosis not present

## 2019-05-31 DIAGNOSIS — M51369 Other intervertebral disc degeneration, lumbar region without mention of lumbar back pain or lower extremity pain: Secondary | ICD-10-CM

## 2019-05-31 DIAGNOSIS — I771 Stricture of artery: Secondary | ICD-10-CM

## 2019-05-31 DIAGNOSIS — E782 Mixed hyperlipidemia: Secondary | ICD-10-CM | POA: Diagnosis not present

## 2019-05-31 DIAGNOSIS — S39012D Strain of muscle, fascia and tendon of lower back, subsequent encounter: Secondary | ICD-10-CM

## 2019-05-31 DIAGNOSIS — G894 Chronic pain syndrome: Secondary | ICD-10-CM

## 2019-05-31 DIAGNOSIS — J449 Chronic obstructive pulmonary disease, unspecified: Secondary | ICD-10-CM

## 2019-05-31 DIAGNOSIS — G4733 Obstructive sleep apnea (adult) (pediatric): Secondary | ICD-10-CM | POA: Diagnosis not present

## 2019-05-31 DIAGNOSIS — M5136 Other intervertebral disc degeneration, lumbar region: Secondary | ICD-10-CM

## 2019-05-31 DIAGNOSIS — Z1159 Encounter for screening for other viral diseases: Secondary | ICD-10-CM

## 2019-05-31 DIAGNOSIS — E1122 Type 2 diabetes mellitus with diabetic chronic kidney disease: Secondary | ICD-10-CM | POA: Diagnosis not present

## 2019-05-31 DIAGNOSIS — F3341 Major depressive disorder, recurrent, in partial remission: Secondary | ICD-10-CM

## 2019-05-31 DIAGNOSIS — Z0000 Encounter for general adult medical examination without abnormal findings: Secondary | ICD-10-CM

## 2019-05-31 DIAGNOSIS — Z87891 Personal history of nicotine dependence: Secondary | ICD-10-CM

## 2019-05-31 DIAGNOSIS — I700 Atherosclerosis of aorta: Secondary | ICD-10-CM

## 2019-05-31 DIAGNOSIS — Z79899 Other long term (current) drug therapy: Secondary | ICD-10-CM

## 2019-05-31 MED ORDER — TIZANIDINE HCL 4 MG PO TABS: 4.0000 mg | ORAL_TABLET | Freq: Three times a day (TID) | ORAL | 1 refills | Status: DC

## 2019-06-01 LAB — HEPATITIS C ANTIBODY
Hepatitis C Ab: NONREACTIVE
SIGNAL TO CUT-OFF: 0.01 (ref <1.00)

## 2019-06-01 LAB — COMPLETE METABOLIC PANEL WITH GFR
AG Ratio: 1.9 (calc) (ref 1.0–2.5)
ALT: 22 U/L (ref 6–29)
AST: 23 U/L (ref 10–35)
Albumin: 4.4 g/dL (ref 3.6–5.1)
Alkaline phosphatase (APISO): 80 U/L (ref 37–153)
BUN: 18 mg/dL (ref 7–25)
CO2: 27 mmol/L (ref 20–32)
Calcium: 10.3 mg/dL (ref 8.6–10.4)
Chloride: 104 mmol/L (ref 98–110)
Creat: 0.86 mg/dL (ref 0.50–0.99)
GFR, Est African American: 83 mL/min/{1.73_m2} (ref >=60)
GFR, Est Non African American: 71 mL/min/{1.73_m2} (ref >=60)
Globulin: 2.3 g/dL (calc) (ref 1.9–3.7)
Glucose, Bld: 99 mg/dL (ref 65–99)
Potassium: 4.2 mmol/L (ref 3.5–5.3)
Sodium: 141 mmol/L (ref 135–146)
Total Bilirubin: 0.5 mg/dL (ref 0.2–1.2)
Total Protein: 6.7 g/dL (ref 6.1–8.1)

## 2019-06-01 LAB — CBC WITH DIFFERENTIAL/PLATELET
Absolute Monocytes: 497 cells/uL (ref 200–950)
Basophils Absolute: 28 cells/uL (ref 0–200)
Basophils Relative: 0.4 %
Eosinophils Absolute: 131 cells/uL (ref 15–500)
Eosinophils Relative: 1.9 %
HCT: 40.1 % (ref 35.0–45.0)
Hemoglobin: 13.5 g/dL (ref 11.7–15.5)
Lymphs Abs: 1773 cells/uL (ref 850–3900)
MCH: 30.9 pg (ref 27.0–33.0)
MCHC: 33.7 g/dL (ref 32.0–36.0)
MCV: 91.8 fL (ref 80.0–100.0)
MPV: 11.1 fL (ref 7.5–12.5)
Monocytes Relative: 7.2 %
Neutro Abs: 4471 cells/uL (ref 1500–7800)
Neutrophils Relative %: 64.8 %
Platelets: 288 10*3/uL (ref 140–400)
RBC: 4.37 10*6/uL (ref 3.80–5.10)
RDW: 11.7 % (ref 11.0–15.0)
Total Lymphocyte: 25.7 %
WBC: 6.9 10*3/uL (ref 3.8–10.8)

## 2019-06-01 LAB — VITAMIN B12: Vitamin B-12: 375 pg/mL (ref 200–1100)

## 2019-06-01 LAB — LIPID PANEL
Cholesterol: 135 mg/dL (ref <200)
HDL: 66 mg/dL (ref >=50)
LDL Cholesterol (Calc): 55 mg/dL (calc)
Non-HDL Cholesterol (Calc): 69 mg/dL (calc) (ref <130)
Total CHOL/HDL Ratio: 2 (calc) (ref <5.0)
Triglycerides: 62 mg/dL (ref <150)

## 2019-06-01 LAB — HEMOGLOBIN A1C
Hgb A1c MFr Bld: 5.7 % of total Hgb — ABNORMAL HIGH (ref <5.7)
Mean Plasma Glucose: 117 (calc)
eAG (mmol/L): 6.5 (calc)

## 2019-06-01 LAB — VITAMIN D 25 HYDROXY (VIT D DEFICIENCY, FRACTURES): Vit D, 25-Hydroxy: 99 ng/mL (ref 30–100)

## 2019-06-25 DIAGNOSIS — M4716 Other spondylosis with myelopathy, lumbar region: Secondary | ICD-10-CM | POA: Diagnosis not present

## 2019-06-25 DIAGNOSIS — M961 Postlaminectomy syndrome, not elsewhere classified: Secondary | ICD-10-CM | POA: Diagnosis not present

## 2019-06-25 DIAGNOSIS — M4326 Fusion of spine, lumbar region: Secondary | ICD-10-CM | POA: Diagnosis not present

## 2019-07-05 ENCOUNTER — Other Ambulatory Visit: Payer: Self-pay

## 2019-07-05 ENCOUNTER — Ambulatory Visit
Admission: RE | Admit: 2019-07-05 | Discharge: 2019-07-05 | Disposition: A | Discharge status: Home / Self Care | Payer: Medicare Other | Source: Ambulatory Visit | Attending: Internal Medicine | Admitting: Internal Medicine

## 2019-07-05 DIAGNOSIS — Z1231 Encounter for screening mammogram for malignant neoplasm of breast: Secondary | ICD-10-CM

## 2019-07-23 DIAGNOSIS — G894 Chronic pain syndrome: Secondary | ICD-10-CM | POA: Diagnosis not present

## 2019-07-23 DIAGNOSIS — Z79891 Long term (current) use of opiate analgesic: Secondary | ICD-10-CM | POA: Diagnosis not present

## 2019-07-23 DIAGNOSIS — Z79899 Other long term (current) drug therapy: Secondary | ICD-10-CM | POA: Diagnosis not present

## 2019-07-23 DIAGNOSIS — M5417 Radiculopathy, lumbosacral region: Secondary | ICD-10-CM | POA: Diagnosis not present

## 2019-07-23 DIAGNOSIS — M5106 Intervertebral disc disorders with myelopathy, lumbar region: Secondary | ICD-10-CM | POA: Diagnosis not present

## 2019-07-23 DIAGNOSIS — M4326 Fusion of spine, lumbar region: Secondary | ICD-10-CM | POA: Diagnosis not present

## 2019-08-22 DIAGNOSIS — Z6824 Body mass index (BMI) 24.0-24.9, adult: Secondary | ICD-10-CM | POA: Diagnosis not present

## 2019-08-22 DIAGNOSIS — M961 Postlaminectomy syndrome, not elsewhere classified: Secondary | ICD-10-CM | POA: Diagnosis not present

## 2019-08-22 DIAGNOSIS — M4326 Fusion of spine, lumbar region: Secondary | ICD-10-CM | POA: Diagnosis not present

## 2019-08-22 DIAGNOSIS — M4716 Other spondylosis with myelopathy, lumbar region: Secondary | ICD-10-CM | POA: Diagnosis not present

## 2019-08-31 ENCOUNTER — Encounter: Payer: Self-pay | Admitting: Internal Medicine

## 2019-08-31 DIAGNOSIS — E663 Overweight: Secondary | ICD-10-CM | POA: Insufficient documentation

## 2019-08-31 DIAGNOSIS — E669 Obesity, unspecified: Secondary | ICD-10-CM | POA: Insufficient documentation

## 2019-08-31 NOTE — Progress Notes (Deleted)
Complete Physical  Assessment and Plan:   Encounter for routine physical exam with abnormal findings ***  Essential hypertension - continue medications, DASH diet, exercise and monitor at home. Call if greater than 130/80.   Tortuous aorta (HCC) Atherosclerosis of aorta (HCC) Per imaging Control blood pressure, lipids and glucose Disscused lifestyle modifications, diet & exercise Continue to monitor  Chronic obstructive pulmonary disease, unspecified COPD type (HCC) No medications at this time No longer smoking Continue to monitor   OSA and COPD overlap syndrome (HCC) CPAP intolerance ***  CKD (chronic kidney disease) stage 2, GFR 60-89 ml/min Increase fluids, avoid NSAIDS, monitor sugars, will monitor  Depression, major, recurrent, in partial remission ([place]) Previously taking wellbutrin 300mg  daily, now taking prozac 40mg  daily.*** Discussed stress management techniques  Discussed, increase water,intake & good sleep hygiene  Discussed increasing exercise & vegetables in diet  Chronic pain syndrome DDD, lumbar Continue ortho follow up, Cohen  Former Tobacco use disorder Still not smoking Encouraged healthy behaviors  Other abnormal glucose Discussed dietary and exercise modifications  Mixed hyperlipidemia Continue medications: Simvastatin 40mg  nightly Discussed dietary and exercise modifications Low fat diet  Vitamin D deficiency Continue supplementation for goal range 60-100 Check vitamin D level  Medication management Continue q57m labs   History of TIA Continue follow up with Dr. Jannifer Franklin  BMI 26.0-26.9 Discussed dietary and exercise modifications  No orders of the defined types were placed in this encounter.     Discussed med's effects and SE's. Screening labs and tests as requested with regular follow-up as recommended. Over 40 minutes of exam, counseling, chart review, and complex, high level critical decision making was performed this visit.    Future Appointments  Date Time Provider [place]  09/03/2019  2:00 PM Unk Pinto, MD [facility] None  09/02/2020  2:00 PM Unk Pinto, MD [facility] None     HPI  64 y.o. female  presents for a complete physical and follow up for has Hyperlipidemia, mixed; Vitamin D deficiency; COPD (chronic obstructive pulmonary disease) ([place]); Medication management; CKD (chronic kidney disease) stage 2, GFR 60-89 ml/min; DDD, lumbar; Chronic pain syndrome; HTN (hypertension); Depression, major, recurrent, in partial remission ([place]); OSA and COPD overlap syndrome ([place]); Aortic atherosclerosis ([place]); Abnormal glucose; Former smoker (30 pack/year, quit 2019); and BMI 26.0-26.9,adult on their problem list..  The patient has has Chronic LBP from lumbar DDD s/p surgery x 2 by Dr. Patrice Paradise in 2004 and is on SS Disability for chronic pain and is followed by Dr Greta Doom for pain management monthly. 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 Tamera Punt.   She has depression stable on SSRI (prozac) treatment.  She is having difficulty sleeping, has tried gabapentin 600 mg but didn't feel this was helpful and continued to toss and turn for hours.   She has a 35+ year smoking history and consequent COPD, reports smoking cessation 05/10/2017 and continues to do well.  She had benign appearing Lung cancer screen CT on 09/21/2018. ***  She has OSA but poorly tolerated CPAP, endorses good sleep quality when weight is controlled ***  BMI is There is no height or weight on file to calculate BMI., she has been working on diet, exercise limited due to chronic pain. She plans to give up sodas and would like to get down to 130 lb Wt Readings from Last 3 Encounters:  05/31/19 157 lb (71.2 kg)  03/01/19 155 lb 6.4 oz (70.5 kg)  11/23/18 153 lb (69.4 kg)   Aortic  atherosclerosis per chest imaging.  Her blood pressure {HAS HAS NOT:18834} been controlled at home, today their BP is   She  {DOES_DOES SWN:46270} workout. She denies chest pain, shortness of breath, dizziness.   She {ACTION; IS/IS JJK:[id number]} on cholesterol medication and denies myalgias. Her cholesterol {ACTION; IS/IS NOT:21021397} at goal. The cholesterol last visit was:   Lab Results  Component Value Date   CHOL 135 05/31/2019   HDL 66 05/31/2019   LDLCALC 55 05/31/2019   TRIG 62 05/31/2019   CHOLHDL 2.0 05/31/2019    She {Has/has not:18111} been working on diet and exercise for prediabetes, and denies {Symptoms; diabetes w/o none:19199}. Last A1C in the office was:  Lab Results  Component Value Date   HGBA1C 5.7 (H) 05/31/2019   Last GFR: Lab Results  Component Value Date   GFRNONAA 71 05/31/2019   Patient is on Vitamin D supplement.   Lab Results  Component Value Date   VD25OH 99 05/31/2019     Lab Results  Component Value Date   WBC 6.9 05/31/2019   HGB 13.5 05/31/2019   HCT 40.1 05/31/2019   MCV 91.8 05/31/2019   PLT 288 05/31/2019   No results found for: IRON, TIBC, FERRITIN  Lab Results  Component Value Date   VITAMINB12 375 05/31/2019      Current Medications:  Current Outpatient Medications on File Prior to Visit  Medication Sig Dispense Refill  . acetaminophen (TYLENOL 8 HOUR) 650 MG CR tablet Take by mouth. Takes 2 tablets daily.    . baclofen (LIORESAL) 20 MG tablet Take 20 mg by mouth 3 (three) times daily as needed for muscle spasms.    . celecoxib (CELEBREX) 200 MG capsule Take 1 capsule (200 mg total) by mouth 2 (two) times daily. 20 capsule 0  . cholecalciferol 5000 units TABS Take 2 tablets daily    . FLUoxetine (PROZAC) 40 MG capsule Take 1 capsule Daily for Mood 90 capsule 3  . ketotifen (ZADITOR) 0.025 % ophthalmic solution Place 1 drop into both eyes 3 (three) times daily as needed (allergies).    Marland Kitchen oxyCODONE (ROXICODONE) 15 MG immediate release tablet Take 15 mg by mouth every 6 (six) hours as needed for pain.   0  . simvastatin (ZOCOR) 40 MG tablet Take 1  tablet at Bedtime for Cholesterol 90 tablet 3  . tiZANidine (ZANAFLEX) 4 MG tablet Take 1 tablet (4 mg total) by mouth 3 (three) times daily. 90 tablet 1   No current facility-administered medications on file prior to visit.   Allergies:  Allergies  Allergen Reactions  . Augmentin [Amoxicillin-Pot Clavulanate] Nausea And Vomiting  . Codeine Nausea And Vomiting  . Zoloft [Sertraline Hcl]     Pt does not recall reaction   . Lipitor [Atorvastatin] Other (See Comments)    Leg pain   Medical History:  She has Hyperlipidemia, mixed; Vitamin D deficiency; COPD (chronic obstructive pulmonary disease) ([place]); Medication management; CKD (chronic kidney disease) stage 2, GFR 60-89 ml/min; DDD, lumbar; Chronic pain syndrome; HTN (hypertension); Depression, major, recurrent, in partial remission ([place]); OSA and COPD overlap syndrome ([place]); Aortic atherosclerosis ([place]); Abnormal glucose; Former smoker (30 pack/year, quit 2019); and BMI 26.0-26.9,adult on their problem list. Health Maintenance:   Immunization History  Administered Date(s) Administered  . Td 01/19/2004, 06/05/2015    Tetanus: Pneumovax: Prevnar 13:  Flu vaccine: Zostavax: LMP: Pap: MGM:  DEXA: Colonoscopy: EGD:  Last Dental Exam: Last Eye Exam:  Patient Care Team: Unk Pinto, MD as PCP -  General (Internal Medicine) Richmond Campbell, MD as Consulting Physician (Gastroenterology) Vicie Mutters, PA-C as Referring Physician (Physician Assistant)  Surgical History:  She has a past surgical history that includes Cataract extraction (Bilateral); Spine surgery; Ankle surgery (Right); Tonsillectomy and adenoidectomy; Breast excisional biopsy (Right); and Back surgery. Family History:  Herfamily history includes COPD in her mother; Cancer in her mother; Heart disease in her father; Hypertension in her mother; Stroke in her mother. Social History:  She reports that she quit smoking about 2 years ago. She has a 30.00  pack-year smoking history. She has never used smokeless tobacco. She reports that she does not drink alcohol and does not use drugs.  Review of Systems: Review of Systems  Constitutional: Negative for malaise/fatigue and weight loss.  HENT: Negative for hearing loss and tinnitus.   Eyes: Negative for blurred vision and double vision.  Respiratory: Negative for cough, shortness of breath and wheezing.   Cardiovascular: Negative for chest pain, palpitations, orthopnea, claudication and leg swelling.  Gastrointestinal: Negative for abdominal pain, blood in stool, constipation, diarrhea, heartburn, melena, nausea and vomiting.  Genitourinary: Negative.   Musculoskeletal: Positive for back pain. Negative for joint pain and myalgias.  Skin: Negative for rash.  Neurological: Negative for dizziness, tingling, sensory change, weakness and headaches.  Endo/Heme/Allergies: Negative for polydipsia.  Psychiatric/Behavioral: Negative.   All other systems reviewed and are negative.   Physical Exam: Estimated body mass index is 26.95 kg/m as calculated from the following:   Height as of 05/31/19: 5\' 4"  (1.626 m).   Weight as of 05/31/19: 157 lb (71.2 kg). There were no vitals taken for this visit. General Appearance: Well nourished, in no apparent distress.  Eyes: PERRLA, EOMs, conjunctiva no swelling or erythema Sinuses: No Frontal/maxillary tenderness  ENT/Mouth: Ext aud canals clear, normal light reflex with TMs without erythema, bulging. Good dentition. No erythema, swelling, or exudate on post pharynx. Tonsils not swollen or erythematous. Hearing normal.  Neck: Supple, thyroid normal. No bruits  Respiratory: Respiratory effort normal, BS equal bilaterally without rales, rhonchi, wheezing or stridor.  Cardio: RRR without murmurs, rubs or gallops. Brisk peripheral pulses without edema.  Chest: symmetric, with normal excursions and percussion.  Breasts: Symmetric, without lumps, nipple discharge,  retractions.  Abdomen: Soft, nontender, no guarding, rebound, hernias, masses, or organomegaly.  Lymphatics: Non tender without lymphadenopathy.  Genitourinary: *** Musculoskeletal: Full ROM all peripheral extremities,5/5 strength, and normal gait.  Skin: Warm, dry without rashes, lesions, ecchymosis. Neuro: Cranial nerves intact, reflexes equal bilaterally. Normal muscle tone, no cerebellar symptoms. Sensation intact.  Psych: Awake and oriented X 3, normal affect, Insight and Judgment appropriate.   EKG: WNL no ST changes.  Gorden Harms Laketta Soderberg 3:03 PM [facility]

## 2019-09-02 ENCOUNTER — Encounter: Payer: Self-pay | Admitting: Internal Medicine

## 2019-09-02 NOTE — Progress Notes (Signed)
   C  A  N  C  E  L  L  E  D   Morning of    Appt                                                                                                                                 This very nice 64 y.o. single WF presents for a Screening /Preventative Visit & comprehensive evaluation and management of multiple medical co-morbidities.  Patient has been followed for HTN, HLD, OSA off CPAP,  COPD,  Prediabetes  and Vitamin D Deficiency. Patient also is on Prozac for hx/o Depression & is controlled at this point.                                                   The patient is followed in pain management and is on SS Disabilityforchronic painfromLumbar DDD & surg x 2 in 2004.      Patient has COPD from 35+ pk years smoking history. Last year (Aug 6th)  she underwent LD Screening  & wediscussed lung cancer screening again.Sheisagreeable to undergo another screening low dose CT scan of the chest. I will refer her for a LDCT lung scan. She alleges stopping smoking in March 2019.      Patient is followed expectantly for labile HTN  since 1998. Patient's BP has been controlled at home and patient denies any cardiac symptoms as chest pain, palpitations, shortness of breath, dizziness or ankle swelling. Today's        Patient's hyperlipidemia is controlled with diet and Simvastatin. Patient denies myalgias or other medication SE's. Last lipids were at goal:  Lab Results  Component Value Date   CHOL 135 05/31/2019   HDL 66 05/31/2019   LDLCALC 55 05/31/2019   TRIG 62 05/31/2019   CHOLHDL 2.0 05/31/2019       Patient has hx/o prediabetes (A1c 5.8% /2010)  and patient denies reactive hypoglycemic symptoms, visual blurring, diabetic polys or paresthesias. Last A1c was near goal:  Lab Results  Component Value Date   HGBA1C 5.7 (H) 05/31/2019       Finally, patient  has history of Vitamin D Deficiency ("20" / 2008) and last Vitamin D was at goal:  Lab Results  Component Value Date   VD25OH 99 05/31/2019

## 2019-09-03 ENCOUNTER — Ambulatory Visit: Payer: Medicare Other | Admitting: Internal Medicine

## 2019-09-04 ENCOUNTER — Ambulatory Visit: Payer: Medicare Other | Admitting: Internal Medicine

## 2019-09-04 NOTE — Progress Notes (Deleted)
MEDICARE ANNUAL WELLNESS VISIT AND FOLLOW UP Assessment:    Encounter for Medicare annual wellness exam 1 year GET MGM  Essential hypertension - continue medications, DASH diet, exercise and monitor at home. Call if greater than 130/80.  -     CBC with Differential/Platelet -     BASIC METABOLIC PANEL WITH GFR -     Hepatic function panel -     TSH  Tortuous aorta (HCC) Control blood pressure, cholesterol, glucose, increase exercise.  Advised to stop smoking  Chronic obstructive pulmonary disease, unspecified COPD type ([place]) Advised to stop smoking, CXR, continue meds.   OSA and COPD overlap syndrome ([place]) Get back CPAP  Atherosclerosis of aorta (HCC) Control blood pressure, cholesterol, glucose, increase exercise.   CKD (chronic kidney disease) stage 2, GFR 60-89 ml/min -     BASIC METABOLIC PANEL WITH GFR  Depression, major, recurrent, in partial remission ([place]) - continue medications, stress management techniques discussed, increase water, good sleep hygiene discussed, increase exercise, and increase veggies.   Chronic pain syndrome Follow up ortho  Former Tobacco use disorder Still not smoking  Other abnormal glucose Discussed disease progression and risks Discussed diet/exercise, weight management and risk modification  Mixed hyperlipidemia -continue medications, check lipids, decrease fatty foods, increase activity.  -     Lipid panel  Vitamin D deficiency -     VITAMIN D 25 Hydroxy (Vit-D Deficiency, Fractures)  Medication management -     Magnesium  DDD, lumbar Continue ortho follow up  TIA Continue follow up with Dr. Jannifer Franklin  Over 30 minutes of exam, counseling, chart review, and critical decision making was performed  Future Appointments  Date Time Provider [place]  09/06/2019 10:00 AM Vicie Mutters, PA-C [facility] None     Plan:   During the course of the visit the patient was educated and counseled about appropriate  screening and preventive services including:    Pneumococcal vaccine   Influenza vaccine  Prevnar 13  Td vaccine  Screening electrocardiogram  Colorectal cancer screening  Diabetes screening  Glaucoma screening  Nutrition counseling    Subjective:  Amber Hickman is a 64 y.o. female who presents for Medicare Annual Wellness Visit and 3 month follow up for HTN, hyperlipidemia, prediabetes, and vitamin D Def.   Her blood pressure has been controlled at home, today their BP is   She does not workout. She denies chest pain, shortness of breath, dizziness. She has OSA and is not on CPAP, She has a 35+ year smoking history and consequent COPD, reports smoking cessation 05/10/2017 and continues to do well.  She had benign appearing Lung cancer screen CT on 09/21/2018.    She had a normal echo 04/2015, normal stress test 04/2015.     The patient has has Chronic LBP from lumbar DDD s/p surgery x 2 by Dr. Patrice Paradise in 2004 and is on SS Disability for chronic pain and is followed by Dr Greta Doom for pain management monthly.  She has depression stable on SSRI (prozac) treatment. She is having difficulty sleeping, has tried gabapentin 600 mg but didn't feel this was helpful and continued to toss and turn for hours. She has known OSA and is not on CPAP  BMI is There is no height or weight on file to calculate BMI., she is working on diet and exercise. Wt Readings from Last 3 Encounters:  05/31/19 157 lb (71.2 kg)  03/01/19 155 lb 6.4 oz (70.5 kg)  11/23/18 153 lb (69.4 kg)  '  She sees pain management for her back pain.  They are prescribing her pain medications. Had back surgery with Dr. Patrice Paradise last April, still has some right leg issues and weakness.    She is still smoking, not ready to quit at this time. Has had frequent falls and some memory issues. She is following with Dr. Jannifer Franklin. She is tapered off topamax and gabapentin.  She is on cholesterol medication and denies myalgias. Her  cholesterol is at goal. The cholesterol last visit was:   Lab Results  Component Value Date   CHOL 135 05/31/2019   HDL 66 05/31/2019   LDLCALC 55 05/31/2019   TRIG 62 05/31/2019   CHOLHDL 2.0 05/31/2019   :  Lab Results  Component Value Date   HGBA1C 5.7 (H) 05/31/2019   Last GFR Lab Results  Component Value Date   GFRNONAA 71 05/31/2019    Patient is on Vitamin D supplement.   Lab Results  Component Value Date   VD25OH 99 05/31/2019      Medication Review: Current Outpatient Medications on File Prior to Visit  Medication Sig Dispense Refill   acetaminophen (TYLENOL 8 HOUR) 650 MG CR tablet Take by mouth. Takes 2 tablets daily.     baclofen (LIORESAL) 20 MG tablet Take 20 mg by mouth 3 (three) times daily as needed for muscle spasms.     celecoxib (CELEBREX) 200 MG capsule Take 1 capsule (200 mg total) by mouth 2 (two) times daily. 20 capsule 0   cholecalciferol 5000 units TABS Take 2 tablets daily     FLUoxetine (PROZAC) 40 MG capsule Take 1 capsule Daily for Mood 90 capsule 3   ketotifen (ZADITOR) 0.025 % ophthalmic solution Place 1 drop into both eyes 3 (three) times daily as needed (allergies).     oxyCODONE (ROXICODONE) 15 MG immediate release tablet Take 15 mg by mouth every 6 (six) hours as needed for pain.   0   simvastatin (ZOCOR) 40 MG tablet Take 1 tablet at Bedtime for Cholesterol 90 tablet 3   tiZANidine (ZANAFLEX) 4 MG tablet Take 1 tablet (4 mg total) by mouth 3 (three) times daily. 90 tablet 1   No current facility-administered medications on file prior to visit.    Allergies: Allergies  Allergen Reactions   Augmentin [Amoxicillin-Pot Clavulanate] Nausea And Vomiting   Codeine Nausea And Vomiting   Zoloft [Sertraline Hcl]     Pt does not recall reaction    Lipitor [Atorvastatin] Other (See Comments)    Leg pain    Current Problems (verified) has Hyperlipidemia, mixed; Vitamin D deficiency; COPD (chronic obstructive pulmonary disease)  ([place]); Medication management; CKD (chronic kidney disease) stage 2, GFR 60-89 ml/min; DDD, lumbar; Chronic pain syndrome; HTN (hypertension); Depression, major, recurrent, in partial remission ([place]); OSA and COPD overlap syndrome ([place]); Aortic atherosclerosis ([place]); Abnormal glucose; Former smoker (30 pack/year, quit 2019); and BMI 26.0-26.9,adult on their problem list.  Screening Tests Immunization History  Administered Date(s) Administered   Td 01/19/2004, 06/05/2015   Health Maintenance  Topic Date Due   FOOT EXAM  Never done   OPHTHALMOLOGY EXAM  Never done   COVID-19 Vaccine (1) Never done   PAP SMEAR-Modifier  01/18/2013   MAMMOGRAM  05/17/2019   URINE MICROALBUMIN  08/22/2019   HEMOGLOBIN A1C  11/30/2019   TETANUS/TDAP  06/04/2025   COLONOSCOPY  09/23/2027   Hepatitis C Screening  Completed   HIV Screening  Completed   INFLUENZA VACCINE  Discontinued    Last colonoscopy: 09/2017  Mammogram 05/2017, Due Echo 2017 Stress test 2017 Ct lung 08/2017 PAP 2011 declines CTA head and neck 09/2017- normal  Prior vaccinations: TD or Tdap 2017  Influenza: Declined  Pneumococcal: Due, but declined Prevnar13: Declined Shingles/Zostavax: Declined COVID-19 - SARS2  Names of Other Physician/Practitioners you currently use: 1. [facility] here for primary care 2. Dr. Katy Fitch, eye doctor, last visit 2020 3. Dr.Sarfan , dentist, last visit Jan 2020 Patient Care Team: Unk Pinto, MD as PCP - General (Internal Medicine) Richmond Campbell, MD as Consulting Physician (Gastroenterology) Vicie Mutters, PA-C as Referring Physician (Physician Assistant)  Surgical: She  has a past surgical history that includes Cataract extraction (Bilateral); Spine surgery; Ankle surgery (Right); Tonsillectomy and adenoidectomy; Breast excisional biopsy (Right); and Back surgery. Family Her family history includes COPD in her mother; Cancer in her  mother; Heart disease in her father; Hypertension in her mother; Stroke in her mother. Social history  She reports that she quit smoking about 2 years ago. She has a 30.00 pack-year smoking history. She has never used smokeless tobacco. She reports that she does not drink alcohol and does not use drugs.   Objective:   There were no vitals filed for this visit. There is no height or weight on file to calculate BMI.  General appearance: alert, no distress, WD/WN, female HEENT: normocephalic, sclerae anicteric, TMs pearly, nares patent, no discharge or erythema, pharynx normal Oral cavity: MMM, no lesions Neck: supple, no lymphadenopathy, no thyromegaly, no masses Heart: RRR, normal S1, S2, no murmurs Lungs: CTA bilaterally, no wheezes, rhonchi, or rales Abdomen: +bs, soft, non tender, non distended, no masses, no hepatomegaly, no splenomegaly Musculoskeletal: nontender, no swelling, no obvious deformity Extremities: no edema, no cyanosis, no clubbing, right leg with some atrophy and decrease muscle compared to left.  Pulses: 2+ symmetric, upper and lower extremities, normal cap refill Neurological: alert, oriented x 3, CN2-12 intact, strength normal upper extremities and lower extremities, sensation normal throughout, DTRs 2+ throughout, no cerebellar signs, gait normal Psychiatric: normal affect, behavior normal, pleasant    Vicie Mutters, PA-C   09/04/2019

## 2019-09-06 ENCOUNTER — Encounter: Payer: Medicare Other | Admitting: Physician Assistant

## 2019-09-06 ENCOUNTER — Telehealth: Payer: Self-pay | Admitting: *Deleted

## 2019-09-06 NOTE — Telephone Encounter (Signed)
Patient called and requested RX for an antibiotic for tooth pain. Per Dr Melford Aase, the patient was advised to be seen by her dentist.
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
## 2019-09-19 ENCOUNTER — Other Ambulatory Visit: Payer: Self-pay

## 2019-09-19 ENCOUNTER — Ambulatory Visit (INDEPENDENT_AMBULATORY_CARE_PROVIDER_SITE_OTHER): Payer: Medicare Other | Admitting: Physician Assistant

## 2019-09-19 ENCOUNTER — Encounter: Payer: Self-pay | Admitting: Physician Assistant

## 2019-09-19 VITALS — BP 120/64 | HR 60 | Temp 97.7°F | Ht 64.0 in | Wt 154.0 lb

## 2019-09-19 DIAGNOSIS — Z79899 Other long term (current) drug therapy: Secondary | ICD-10-CM

## 2019-09-19 DIAGNOSIS — I10 Essential (primary) hypertension: Secondary | ICD-10-CM | POA: Diagnosis not present

## 2019-09-19 DIAGNOSIS — M5136 Other intervertebral disc degeneration, lumbar region: Secondary | ICD-10-CM

## 2019-09-19 DIAGNOSIS — Z0000 Encounter for general adult medical examination without abnormal findings: Secondary | ICD-10-CM | POA: Diagnosis not present

## 2019-09-19 DIAGNOSIS — Z136 Encounter for screening for cardiovascular disorders: Secondary | ICD-10-CM

## 2019-09-19 DIAGNOSIS — M4716 Other spondylosis with myelopathy, lumbar region: Secondary | ICD-10-CM | POA: Diagnosis not present

## 2019-09-19 DIAGNOSIS — G894 Chronic pain syndrome: Secondary | ICD-10-CM

## 2019-09-19 DIAGNOSIS — R590 Localized enlarged lymph nodes: Secondary | ICD-10-CM

## 2019-09-19 DIAGNOSIS — N182 Chronic kidney disease, stage 2 (mild): Secondary | ICD-10-CM | POA: Diagnosis not present

## 2019-09-19 DIAGNOSIS — R7309 Other abnormal glucose: Secondary | ICD-10-CM

## 2019-09-19 DIAGNOSIS — R682 Dry mouth, unspecified: Secondary | ICD-10-CM

## 2019-09-19 DIAGNOSIS — K1370 Unspecified lesions of oral mucosa: Secondary | ICD-10-CM

## 2019-09-19 DIAGNOSIS — I700 Atherosclerosis of aorta: Secondary | ICD-10-CM | POA: Diagnosis not present

## 2019-09-19 DIAGNOSIS — E782 Mixed hyperlipidemia: Secondary | ICD-10-CM | POA: Diagnosis not present

## 2019-09-19 DIAGNOSIS — Z6824 Body mass index (BMI) 24.0-24.9, adult: Secondary | ICD-10-CM | POA: Diagnosis not present

## 2019-09-19 DIAGNOSIS — F3341 Major depressive disorder, recurrent, in partial remission: Secondary | ICD-10-CM

## 2019-09-19 DIAGNOSIS — J449 Chronic obstructive pulmonary disease, unspecified: Secondary | ICD-10-CM

## 2019-09-19 DIAGNOSIS — M961 Postlaminectomy syndrome, not elsewhere classified: Secondary | ICD-10-CM | POA: Diagnosis not present

## 2019-09-19 DIAGNOSIS — Z87891 Personal history of nicotine dependence: Secondary | ICD-10-CM

## 2019-09-19 DIAGNOSIS — M4326 Fusion of spine, lumbar region: Secondary | ICD-10-CM | POA: Diagnosis not present

## 2019-09-19 DIAGNOSIS — G4733 Obstructive sleep apnea (adult) (pediatric): Secondary | ICD-10-CM

## 2019-09-19 DIAGNOSIS — I771 Stricture of artery: Secondary | ICD-10-CM

## 2019-09-19 DIAGNOSIS — E559 Vitamin D deficiency, unspecified: Secondary | ICD-10-CM

## 2019-09-19 MED ORDER — AMOXICILLIN 500 MG PO CAPS: 500.0000 mg | ORAL_CAPSULE | Freq: Three times a day (TID) | ORAL | 0 refills | Status: AC

## 2019-09-19 MED ORDER — DULOXETINE HCL 30 MG PO CPEP: 30.0000 mg | ORAL_CAPSULE | Freq: Every day | ORAL | 2 refills | Status: DC

## 2019-09-19 NOTE — Patient Instructions (Addendum)
HOW TO SCHEDULE A MAMMOGRAM  [facility]  7 a.m.-6:30 p.m., Monday 7 a.m.-5 p.m., Tuesday-Friday Schedule an appointment by calling [phone].  YOU CAN CALL TO MAKE AN ULTRASOUND..  I have put in an order for an ultrasound for you to have You can set them up at your convenience by calling this number [phone] You will likely have the ultrasound at [place] [address]  If you have any issues call our office and we will set this up for you.   PROZAC  Start to take every other day and start on cymbalta 30 mg daily for 7 days Then stop the prozac and start on the cymbalta 60 mg Or 2 of the Cymbalta a day- when you rune out will send in the 60mg  if you are doing well- can go up to 90 mg This is for pain and for depression Let me know how you do  Dental Assistance  If unable to pay or uninsured, contact: [facility]. to become qualified for the adult dental clinic.  If unable to pay, or uninsured, contact [facility] [phone] in [place], [place] in [place] [facility]) to become qualified for the adult dental clinic  [facility]  [address]  [place], [place] [address]  [phone]  [email or web address]  Other [place]:  [facility] [place], [place], [place], [address], [place], [address], 2nd and 4th Thursday of the month at 6:30am. 10 clients each day by appointment, can sometimes see walk-in patients if someone does not show for an appointment.  [facility]- [address] [place], [place], [address], [place], [place], [place], [address], [place] [phone]  [place]  [facility] [phone]      Living With Depression Everyone experiences occasional disappointment, sadness, and loss in their lives.  When you are feeling down, blue, or sad for at least 2 weeks in a row, it may mean that you have depression. Depression can affect your thoughts and feelings, relationships, daily activities, and physical health. It is caused by changes in the way your brain functions. If you receive a diagnosis of depression, your health care provider will tell you which type of depression you have and what treatment options are available to you. If you are living with depression, there are ways to help you recover from it and also ways to prevent it from coming back. How to cope with lifestyle changes Coping with stress     Stress is your body's reaction to life changes and events, both good and bad. Stressful situations may include:  Getting married.  The death of a spouse.  Losing a job.  Retiring.  Having a baby. Stress can last just a few hours or it can be ongoing. Stress can play a major role in depression, so it is important to learn both how to cope with stress and how to think about it differently. Talk with your health care provider or a counselor if you would like to learn more about stress reduction. He or she may suggest some stress reduction techniques, such as:  Music therapy. This can include creating music or listening to music. Choose music that you enjoy and that inspires you.  Mindfulness-based meditation. This kind of meditation can be done while sitting or walking. It involves being aware  of your normal breaths, rather than trying to control your breathing.  Centering prayer. This is a kind of meditation that involves focusing on a spiritual word or phrase. Choose a word, phrase, or sacred image that is meaningful to you and that brings you peace.  Deep breathing. To do this, expand your stomach and inhale slowly through your nose. Hold your breath for 3-5 seconds, then exhale slowly, allowing your stomach muscles to relax.  Muscle relaxation. This involves intentionally tensing  muscles then relaxing them. Choose a stress reduction technique that fits your lifestyle and personality. Stress reduction techniques take time and practice to develop. Set aside 5-15 minutes a day to do them. Therapists can offer training in these techniques. The training may be covered by some insurance plans. Other things you can do to manage stress include:  Keeping a stress diary. This can help you learn what triggers your stress and ways to control your response.  Understanding what your limits are and saying no to requests or events that lead to a schedule that is too full.  Thinking about how you respond to certain situations. You may not be able to control everything, but you can control how you react.  Adding humor to your life by watching funny films or TV shows.  Making time for activities that help you relax and not feeling guilty about spending your time this way.  Medicines Your health care provider may suggest certain medicines if he or she feels that they will help improve your condition. Avoid using alcohol and other substances that may prevent your medicines from working properly (may interact). It is also important to:  Talk with your pharmacist or health care provider about all the medicines that you take, their possible side effects, and what medicines are safe to take together.  Make it your goal to take part in all treatment decisions (shared decision-making). This includes giving input on the side effects of medicines. It is best if shared decision-making with your health care provider is part of your total treatment plan. If your health care provider prescribes a medicine, you may not notice the full benefits of it for 4-8 weeks. Most people who are treated for depression need to be on medicine for at least 6-12 months after they feel better. If you are taking medicines as part of your treatment, do not stop taking medicines without first talking to your health care  provider. You may need to have the medicine slowly decreased (tapered) over time to decrease the risk of harmful side effects. Relationships Your health care provider may suggest family therapy along with individual therapy and drug therapy. While there may not be family problems that are causing you to feel depressed, it is still important to make sure your family learns as much as they can about your mental health. Having your family's support can help make your treatment successful. How to recognize changes in your condition Everyone has a different response to treatment for depression. Recovery from major depression happens when you have not had signs of major depression for two months. This may mean that you will start to:  Have more interest in doing activities.  Feel less hopeless than you did 2 months ago.  Have more energy.  Overeat less often, or have better or improving appetite.  Have better concentration. Your health care provider will work with you to decide the next steps in your recovery. It is also important to recognize when your condition is getting  worse. Watch for these signs:  Having fatigue or low energy.  Eating too much or too little.  Sleeping too much or too little.  Feeling restless, agitated, or hopeless.  Having trouble concentrating or making decisions.  Having unexplained physical complaints.  Feeling irritable, angry, or aggressive. Get help as soon as you or your family members notice these symptoms coming back. How to get support and help from others How to talk with friends and family members about your condition  Talking to friends and family members about your condition can provide you with one way to get support and guidance. Reach out to trusted friends or family members, explain your symptoms to them, and let them know that you are working with a health care provider to treat your depression. Financial resources Not all insurance plans  cover mental health care, so it is important to check with your insurance carrier. If paying for co-pays or counseling services is a problem, search for a local or county mental health care center. They may be able to offer public mental health care services at low or no cost when you are not able to see a private health care provider. If you are taking medicine for depression, you may be able to get the generic form, which may be less expensive. Some makers of prescription medicines also offer help to patients who cannot afford the medicines they need. Follow these instructions at home:   Get the right amount and quality of sleep.  Cut down on using caffeine, tobacco, alcohol, and other potentially harmful substances.  Try to exercise, such as walking or lifting small weights.  Take over-the-counter and prescription medicines only as told by your health care provider.  Eat a healthy diet that includes plenty of vegetables, fruits, whole grains, low-fat dairy products, and lean protein. Do not eat a lot of foods that are high in solid fats, added sugars, or salt.  Keep all follow-up visits as told by your health care provider. This is important. Contact a health care provider if:  You stop taking your antidepressant medicines, and you have any of these symptoms: ? Nausea. ? Headache. ? Feeling lightheaded. ? Chills and body aches. ? Not being able to sleep (insomnia).  You or your friends and family think your depression is getting worse. Get help right away if:  You have thoughts of hurting yourself or others. If you ever feel like you may hurt yourself or others, or have thoughts about taking your own life, get help right away. You can go to your nearest emergency department or call:  Your local emergency services (911 in the [place]  A suicide crisis helpline, such as the [facility] at 915-567-1480. This is open 24-hours a day. Summary  If you are  living with depression, there are ways to help you recover from it and also ways to prevent it from coming back.  Work with your health care team to create a management plan that includes counseling, stress management techniques, and healthy lifestyle habits. This information is not intended to replace advice given to you by your health care provider. Make sure you discuss any questions you have with your health care provider. Document Revised: 05/26/2018 Document Reviewed: 01/05/2016 Elsevier Patient Education  [place].

## 2019-09-20 ENCOUNTER — Encounter: Payer: Self-pay | Admitting: Physician Assistant

## 2019-09-20 LAB — CBC WITH DIFFERENTIAL/PLATELET
Absolute Monocytes: 549 cells/uL (ref 200–950)
Basophils Absolute: 43 cells/uL (ref 0–200)
Basophils Relative: 0.7 %
Eosinophils Absolute: 201 cells/uL (ref 15–500)
Eosinophils Relative: 3.3 %
HCT: 39.9 % (ref 35.0–45.0)
Hemoglobin: 13.1 g/dL (ref 11.7–15.5)
Lymphs Abs: 2397 cells/uL (ref 850–3900)
MCH: 31.2 pg (ref 27.0–33.0)
MCHC: 32.8 g/dL (ref 32.0–36.0)
MCV: 95 fL (ref 80.0–100.0)
MPV: 10.8 fL (ref 7.5–12.5)
Monocytes Relative: 9 %
Neutro Abs: 2910 cells/uL (ref 1500–7800)
Neutrophils Relative %: 47.7 %
Platelets: 258 10*3/uL (ref 140–400)
RBC: 4.2 10*6/uL (ref 3.80–5.10)
RDW: 12.4 % (ref 11.0–15.0)
Total Lymphocyte: 39.3 %
WBC: 6.1 10*3/uL (ref 3.8–10.8)

## 2019-09-20 LAB — COMPLETE METABOLIC PANEL WITH GFR
AG Ratio: 1.9 (calc) (ref 1.0–2.5)
ALT: 23 U/L (ref 6–29)
AST: 23 U/L (ref 10–35)
Albumin: 4.3 g/dL (ref 3.6–5.1)
Alkaline phosphatase (APISO): 93 U/L (ref 37–153)
BUN: 22 mg/dL (ref 7–25)
CO2: 32 mmol/L (ref 20–32)
Calcium: 9.9 mg/dL (ref 8.6–10.4)
Chloride: 105 mmol/L (ref 98–110)
Creat: 0.85 mg/dL (ref 0.50–0.99)
GFR, Est African American: 84 mL/min/{1.73_m2} (ref >=60)
GFR, Est Non African American: 72 mL/min/{1.73_m2} (ref >=60)
Globulin: 2.3 g/dL (calc) (ref 1.9–3.7)
Glucose, Bld: 92 mg/dL (ref 65–99)
Potassium: 4.8 mmol/L (ref 3.5–5.3)
Sodium: 141 mmol/L (ref 135–146)
Total Bilirubin: 0.4 mg/dL (ref 0.2–1.2)
Total Protein: 6.6 g/dL (ref 6.1–8.1)

## 2019-09-20 LAB — URINALYSIS, ROUTINE W REFLEX MICROSCOPIC
Bilirubin Urine: NEGATIVE
Glucose, UA: NEGATIVE
Hgb urine dipstick: NEGATIVE
Ketones, ur: NEGATIVE
Leukocytes,Ua: NEGATIVE
Nitrite: NEGATIVE
Protein, ur: NEGATIVE
Specific Gravity, Urine: 1.015 (ref 1.001–1.03)
pH: <=5 (ref 5.0–8.0)

## 2019-09-20 LAB — HEMOGLOBIN A1C
Hgb A1c MFr Bld: 5.6 % of total Hgb (ref <5.7)
Mean Plasma Glucose: 114 (calc)
eAG (mmol/L): 6.3 (calc)

## 2019-09-20 LAB — MICROALBUMIN / CREATININE URINE RATIO
Creatinine, Urine: 76 mg/dL (ref 20–275)
Microalb Creat Ratio: 4 mcg/mg creat (ref <30)
Microalb, Ur: 0.3 mg/dL

## 2019-09-20 LAB — LIPID PANEL
Cholesterol: 134 mg/dL (ref <200)
HDL: 56 mg/dL (ref >=50)
LDL Cholesterol (Calc): 56 mg/dL (calc)
Non-HDL Cholesterol (Calc): 78 mg/dL (calc) (ref <130)
Total CHOL/HDL Ratio: 2.4 (calc) (ref <5.0)
Triglycerides: 139 mg/dL (ref <150)

## 2019-09-20 LAB — SJOGRENS SYNDROME-A EXTRACTABLE NUCLEAR ANTIBODY: SSA (Ro) (ENA) Antibody, IgG: <1 AI

## 2019-09-20 LAB — TSH: TSH: 2.23 mIU/L (ref 0.40–4.50)

## 2019-09-20 LAB — SJOGRENS SYNDROME-B EXTRACTABLE NUCLEAR ANTIBODY: SSB (La) (ENA) Antibody, IgG: <1 AI

## 2019-09-25 ENCOUNTER — Other Ambulatory Visit: Payer: Self-pay | Admitting: Physician Assistant

## 2019-09-25 ENCOUNTER — Other Ambulatory Visit: Payer: Self-pay | Admitting: Internal Medicine

## 2019-09-25 DIAGNOSIS — Z1231 Encounter for screening mammogram for malignant neoplasm of breast: Secondary | ICD-10-CM

## 2019-09-26 ENCOUNTER — Ambulatory Visit
Admission: RE | Admit: 2019-09-26 | Discharge: 2019-09-26 | Disposition: A | Discharge status: Home / Self Care | Payer: Medicare Other | Source: Ambulatory Visit | Attending: Physician Assistant | Admitting: Physician Assistant

## 2019-09-26 DIAGNOSIS — R590 Localized enlarged lymph nodes: Secondary | ICD-10-CM

## 2019-10-02 ENCOUNTER — Ambulatory Visit
Admission: RE | Admit: 2019-10-02 | Discharge: 2019-10-02 | Disposition: A | Discharge status: Home / Self Care | Payer: Medicare Other | Source: Ambulatory Visit | Attending: Physician Assistant | Admitting: Physician Assistant

## 2019-10-02 ENCOUNTER — Other Ambulatory Visit: Payer: Self-pay

## 2019-10-02 DIAGNOSIS — Z87891 Personal history of nicotine dependence: Secondary | ICD-10-CM

## 2019-10-04 ENCOUNTER — Other Ambulatory Visit: Payer: Self-pay | Admitting: Internal Medicine

## 2019-10-04 MED ORDER — SIMVASTATIN 40 MG PO TABS: ORAL_TABLET | ORAL | 0 refills | Status: DC

## 2019-10-11 ENCOUNTER — Ambulatory Visit: Payer: Medicare Other

## 2019-10-17 DIAGNOSIS — M4326 Fusion of spine, lumbar region: Secondary | ICD-10-CM | POA: Diagnosis not present

## 2019-10-17 DIAGNOSIS — M961 Postlaminectomy syndrome, not elsewhere classified: Secondary | ICD-10-CM | POA: Diagnosis not present

## 2019-10-17 DIAGNOSIS — M4716 Other spondylosis with myelopathy, lumbar region: Secondary | ICD-10-CM | POA: Diagnosis not present

## 2019-10-17 NOTE — Progress Notes (Signed)
Assessment and Plan:  Medication management -     COMPLETE METABOLIC PANEL WITH GFR- in 6 weeks  Depression, major, recurrent, in partial remission (HCC) -     DULoxetine (CYMBALTA) 60 MG capsule; Take 1 capsule (60 mg total) by mouth daily. Will stop prozac and increase to 60 mg daily  Toenail fungus Onychomycosis- has tried OTC meds, willing to try Lamisil, side effects discussed, will follow up in 6 weeks for LFTs, cheapest at [employer] -     terbinafine (LAMISIL) 250 MG tablet; Take one daily for 3 months for your toenail, need liver function lab at 6 weeks.     Future Appointments  Date Time Provider [facility]  12/26/2019 11:30 AM Garnet Sierras, NP [facility] None  09/22/2020  3:00 PM Vicie Mutters, PA-C [facility] None     HPI 64 y.o.female presents for follow up for change of medications and abnormal mouth lesion.   Her prozac was switched to Cymbalta for depression/pain relief. She felt better for the first week or two but then that has not been helping as much.   She fell in a parking lot on August 7th, hit the back of her head, no LOC. Had some abnormal dreams, HA but she is doing better.   She also had poor dentition and was given amoxicillin for possible infection last visit. She had a granulomatous nodule on her right upper gum last visit, had negative neck US for lymphadenopathy.    Patient Active Problem List   Diagnosis Date Noted  . BMI 26.0-26.9,adult 08/31/2019  . Abnormal glucose 02/15/2018  . Former smoker (30 pack/year, quit 2019) 02/15/2018  . Aortic atherosclerosis ([place]) 09/09/2017  . OSA and COPD overlap syndrome ([place]) 10/13/2016  . Depression, major, recurrent, in partial remission ([place]) 10/12/2016  . HTN (hypertension) 06/05/2015  . DDD, lumbar 05/21/2014  . Chronic pain syndrome 05/21/2014  . CKD (chronic kidney disease) stage 2, GFR 60-89 ml/min 02/11/2014  . Medication management 11/01/2013  . COPD (chronic  obstructive pulmonary disease) ([place]) 04/27/2013  . Hyperlipidemia, mixed   . Vitamin D deficiency       Current Outpatient Medications (Cardiovascular):  .  simvastatin (ZOCOR) 40 MG tablet, Take 1 tablet at Bedtime for Cholesterol   Current Outpatient Medications (Analgesics):  .  acetaminophen (TYLENOL 8 HOUR) 650 MG CR tablet, Take by mouth. Takes 2 tablets daily. Marland Kitchen  oxyCODONE (ROXICODONE) 15 MG immediate release tablet, Take 15 mg by mouth every 6 (six) hours as needed for pain.  .  celecoxib (CELEBREX) 200 MG capsule, Take 1 capsule (200 mg total) by mouth 2 (two) times daily.   Current Outpatient Medications (Other):  .  baclofen (LIORESAL) 20 MG tablet, Take 20 mg by mouth 3 (three) times daily as needed for muscle spasms. .  cholecalciferol 5000 units TABS, Take 2 tablets daily .  DULoxetine (CYMBALTA) 60 MG capsule, Take 1 capsule (60 mg total) by mouth daily. Marland Kitchen  ketotifen (ZADITOR) 0.025 % ophthalmic solution, Place 1 drop into both eyes 3 (three) times daily as needed (allergies). Marland Kitchen  tiZANidine (ZANAFLEX) 4 MG tablet, Take 1 tablet (4 mg total) by mouth 3 (three) times daily. Marland Kitchen  terbinafine (LAMISIL) 250 MG tablet, Take one daily for 3 months for your toenail, need liver function lab at 6 weeks.  Allergies  Allergen Reactions  . Augmentin [Amoxicillin-Pot Clavulanate] Nausea And Vomiting  . Codeine Nausea And Vomiting  . Zoloft [Sertraline Hcl]     Pt does not recall reaction   .  Lipitor [Atorvastatin] Other (See Comments)    Leg pain    ROS: all negative except above.   Physical Exam: Filed Weights   10/19/19 1102  Weight: 152 lb 9.6 oz (69.2 kg)   BP 110/70   Pulse 70   Temp (!) 97.5 F (36.4 C)   Ht 5\' 4"  (1.626 m)   Wt 152 lb 9.6 oz (69.2 kg)   SpO2 97%   BMI 26.19 kg/m  General appearance: alert, no distress, WD/WN, female HEENT: normocephalic, sclerae anicteric, TMs pearly, nares patent, no discharge or erythema, pharynx normal Neck: supple, + non  tender right sided lymphadenopathy, no thyromegaly, no masses Heart: RRR, normal S1, S2, no murmurs Lungs: CTA bilaterally, no wheezes, rhonchi, or rales Abdomen: +bs, soft, non tender, non distended, no masses, no hepatomegaly, no splenomegaly Musculoskeletal: nontender, no swelling, no obvious deformity, pain with sitting from standing.  Extremities: no edema, no cyanosis, no clubbing, right leg with some atrophy and decrease muscle compared to left. Bilateral big toe nails with thickening, brittle and yellow, no erythema.  Pulses: 2+ symmetric, upper and lower extremities, normal cap refill Neurological: alert, oriented x 3, CN2-12 intact, strength normal upper extremities and lower extremities, sensation normal throughout, DTRs 2+ throughout, no cerebellar signs, gait antalgic Psychiatric: normal affect, behavior normal, pleasant   Vicie Mutters, PA-C 11:34 AM [facility]

## 2019-10-19 ENCOUNTER — Other Ambulatory Visit: Payer: Self-pay

## 2019-10-19 ENCOUNTER — Encounter: Payer: Self-pay | Admitting: Physician Assistant

## 2019-10-19 ENCOUNTER — Ambulatory Visit (INDEPENDENT_AMBULATORY_CARE_PROVIDER_SITE_OTHER): Payer: Medicare Other | Admitting: Physician Assistant

## 2019-10-19 VITALS — BP 110/70 | HR 70 | Temp 97.5°F | Ht 64.0 in | Wt 152.6 lb

## 2019-10-19 DIAGNOSIS — F3341 Major depressive disorder, recurrent, in partial remission: Secondary | ICD-10-CM | POA: Diagnosis not present

## 2019-10-19 DIAGNOSIS — B351 Tinea unguium: Secondary | ICD-10-CM | POA: Diagnosis not present

## 2019-10-19 DIAGNOSIS — Z79899 Other long term (current) drug therapy: Secondary | ICD-10-CM | POA: Diagnosis not present

## 2019-10-19 MED ORDER — TERBINAFINE HCL 250 MG PO TABS: ORAL_TABLET | ORAL | 0 refills | Status: DC

## 2019-10-19 MED ORDER — DULOXETINE HCL 60 MG PO CPEP: 60.0000 mg | ORAL_CAPSULE | Freq: Every day | ORAL | 2 refills | Status: DC

## 2019-10-19 NOTE — Patient Instructions (Addendum)
Stop the prozac  Start on the cymbalta 60 mg daily for pain- let us know how you do  Okay I will send in lamisil for you to take. You can only get it from [facility] or [employer] will not cover it.The lamisil is taken once a day for  months and then if can be taken for several months after for a month on it and month off it. It gets processed through you liver so we need to check your liver function at 6 weeks after taking the drug. It can take up to 6 months to a year for your toenails to get better.    Counseling services  I suggest calling your insurance and finding out who is in your network and THEN calling those people or looking them up on google.   I'm a big fan of Cognitive Behavioral Therapy, look this up on You tube or check with the therapist you see if they are certified.  This form of therapy helps to teach you skills to better handle with current situation that are causing anxiety or depression.   There are some great apps too Check out [place], give thanks app.  Meditations apps are great like headspace.    WOMEN AND HEART ATTACKS  Being a woman you may not have the typical symptoms of a heart attack.  You may not have any pain OR you may have atypical pain such as jaw pain, upper back pain, arm pain, "my bra feels to tight" and you will often have symptoms with it like below.  Symptoms for a heart attack will likely occur when you exert your self or exercise and include: Shortness of breath Sweating Nausea Dizziness Fast or irregular heart beats Fatigue   It makes me feel better if my patients get their heart rate up with exercise once or twice a week and pay close attention to your body. If there is ANY change in your exercise capacity or if you have symptoms above, please STOP and call 911 or call to come to the office.   Here is some information to help you keep your heart healthy: Move it! - Aim for 30 mins of activity every day. Take it  slowly at first. Talk to [place] before starting any new exercise program.   Lose it.  -Body Mass Index (BMI) can indicate if you need to lose weight. A healthy range is 18.5-24.9. For a BMI calculator, go to [employer].com  Waist Management -Excess abdominal fat is a risk factor for heart disease, diabetes, asthma, stroke and more. Ideal waist circumference is less than 35" for women and less than 40" for men.   Eat Right -focus on fruits, vegetables, whole grains, and meals you make yourself. Avoid foods with trans fat and high sugar/sodium content.   Snooze or Snore? - Loud snoring can be a sign of sleep apnea, a significant risk factor for high blood pressure, heart attach, stroke, and heart arrhythmias.  Kick the habit -Quit Smoking! Avoid second hand smoke. A single cigarette raises your blood pressure for 20 mins and increases the risk of heart attack and stroke for the next 24 hours.   Are Aspirin and Supplements right for you? -Add ENTERIC COATED low dose 81 mg Aspirin daily OR can do every other day if you have easy bruising to protect your heart and head. As well as to reduce risk of Colon Cancer by 20 %, Skin Cancer by 26 % , Melanoma by 46%  and Pancreatic cancer by 60%  Say "No to Stress -There may be little you can do about problems that cause stress. However, techniques such as long walks, meditation, and exercise can help you manage it.   Start Now! - Make changes one at a time and set reasonable goals to increase your likelihood of success.

## 2019-11-07 ENCOUNTER — Telehealth: Payer: Self-pay | Admitting: Physician Assistant

## 2019-11-07 NOTE — Telephone Encounter (Signed)
-----   Message from Elenor Quinones, [place] sent at 11/07/2019  2:46 PM EDT ----- Regarding: [place]: [phone] Patient reports that her DULOXETINE has increased to 2 tablets daily but the OLD rx is for 1 tablet daily. Patient would like you to change it to state that.

## 2019-11-08 ENCOUNTER — Telehealth: Payer: Self-pay

## 2019-11-08 NOTE — Telephone Encounter (Signed)
Spoke with patient at 9:40 am & she states that she was not aware that the 60 mgs tablet was sent into her pharmacy. And she also states that she is aware to only take one 60 mgs tablet daily. I informed that the patient at that time that on 10-19-2019 a 60 mgs tablet with 3 refills were sent into the pharmacy. Patient voiced understanding & agreed to the information.

## 2019-11-08 NOTE — Telephone Encounter (Signed)
-----   Message from Vicie Mutters, [place] sent at 11/07/2019  2:56 PM EDT ----- Regarding: RE: [place]: [phone] I sent in 60mg  cymablta for her to take once a day. She was on 30mg  and taking 2 a day until she ran out, then she was to pick up the 60 mg to take once a day. The RX is correct. Do not take 60 mg two pills a day.  [facility] on [facility] [place] ----- Message ----- From: Elenor Quinones, CMA Sent: 11/07/2019   2:46 PM EDT To: Vicie Mutters, PA-C Subject: INCREASE OF MED DOSE                           Patient reports that her DULOXETINE has increased to 2 tablets daily but the OLD rx is for 1 tablet daily. Patient would like you to change it to state that.

## 2019-11-14 DIAGNOSIS — M961 Postlaminectomy syndrome, not elsewhere classified: Secondary | ICD-10-CM | POA: Diagnosis not present

## 2019-11-14 DIAGNOSIS — M4326 Fusion of spine, lumbar region: Secondary | ICD-10-CM | POA: Diagnosis not present

## 2019-11-14 DIAGNOSIS — M5106 Intervertebral disc disorders with myelopathy, lumbar region: Secondary | ICD-10-CM | POA: Diagnosis not present

## 2019-11-14 DIAGNOSIS — M4716 Other spondylosis with myelopathy, lumbar region: Secondary | ICD-10-CM | POA: Diagnosis not present

## 2019-11-14 DIAGNOSIS — M542 Cervicalgia: Secondary | ICD-10-CM | POA: Diagnosis not present

## 2019-11-30 ENCOUNTER — Other Ambulatory Visit: Payer: Medicare Other

## 2019-11-30 ENCOUNTER — Other Ambulatory Visit: Payer: Self-pay

## 2019-11-30 DIAGNOSIS — Z79899 Other long term (current) drug therapy: Secondary | ICD-10-CM | POA: Diagnosis not present

## 2019-12-01 LAB — COMPLETE METABOLIC PANEL WITH GFR
AG Ratio: 1.8 (calc) (ref 1.0–2.5)
ALT: 62 U/L — ABNORMAL HIGH (ref 6–29)
AST: 42 U/L — ABNORMAL HIGH (ref 10–35)
Albumin: 4.3 g/dL (ref 3.6–5.1)
Alkaline phosphatase (APISO): 104 U/L (ref 37–153)
BUN: 20 mg/dL (ref 7–25)
CO2: 29 mmol/L (ref 20–32)
Calcium: 9.9 mg/dL (ref 8.6–10.4)
Chloride: 103 mmol/L (ref 98–110)
Creat: 0.83 mg/dL (ref 0.50–0.99)
GFR, Est African American: 86 mL/min/{1.73_m2} (ref >=60)
GFR, Est Non African American: 75 mL/min/{1.73_m2} (ref >=60)
Globulin: 2.4 g/dL (calc) (ref 1.9–3.7)
Glucose, Bld: 97 mg/dL (ref 65–99)
Potassium: 4.7 mmol/L (ref 3.5–5.3)
Sodium: 138 mmol/L (ref 135–146)
Total Bilirubin: 0.4 mg/dL (ref 0.2–1.2)
Total Protein: 6.7 g/dL (ref 6.1–8.1)

## 2019-12-18 DIAGNOSIS — M4326 Fusion of spine, lumbar region: Secondary | ICD-10-CM | POA: Diagnosis not present

## 2019-12-18 DIAGNOSIS — M5106 Intervertebral disc disorders with myelopathy, lumbar region: Secondary | ICD-10-CM | POA: Diagnosis not present

## 2019-12-18 DIAGNOSIS — G894 Chronic pain syndrome: Secondary | ICD-10-CM | POA: Diagnosis not present

## 2019-12-26 ENCOUNTER — Encounter: Payer: Self-pay | Admitting: Adult Health Nurse Practitioner

## 2019-12-26 ENCOUNTER — Ambulatory Visit (INDEPENDENT_AMBULATORY_CARE_PROVIDER_SITE_OTHER): Payer: Medicare Other | Admitting: Adult Health Nurse Practitioner

## 2019-12-26 ENCOUNTER — Other Ambulatory Visit: Payer: Self-pay

## 2019-12-26 VITALS — BP 130/70 | Temp 97.5°F | Ht 64.0 in | Wt 159.0 lb

## 2019-12-26 DIAGNOSIS — M79671 Pain in right foot: Secondary | ICD-10-CM

## 2019-12-26 DIAGNOSIS — G894 Chronic pain syndrome: Secondary | ICD-10-CM

## 2019-12-26 DIAGNOSIS — M51369 Other intervertebral disc degeneration, lumbar region without mention of lumbar back pain or lower extremity pain: Secondary | ICD-10-CM

## 2019-12-26 DIAGNOSIS — Z8673 Personal history of transient ischemic attack (TIA), and cerebral infarction without residual deficits: Secondary | ICD-10-CM

## 2019-12-26 DIAGNOSIS — J449 Chronic obstructive pulmonary disease, unspecified: Secondary | ICD-10-CM | POA: Diagnosis not present

## 2019-12-26 DIAGNOSIS — G4733 Obstructive sleep apnea (adult) (pediatric): Secondary | ICD-10-CM | POA: Diagnosis not present

## 2019-12-26 DIAGNOSIS — K047 Periapical abscess without sinus: Secondary | ICD-10-CM

## 2019-12-26 DIAGNOSIS — E782 Mixed hyperlipidemia: Secondary | ICD-10-CM

## 2019-12-26 DIAGNOSIS — I771 Stricture of artery: Secondary | ICD-10-CM | POA: Diagnosis not present

## 2019-12-26 DIAGNOSIS — E559 Vitamin D deficiency, unspecified: Secondary | ICD-10-CM

## 2019-12-26 DIAGNOSIS — I700 Atherosclerosis of aorta: Secondary | ICD-10-CM | POA: Diagnosis not present

## 2019-12-26 DIAGNOSIS — F3341 Major depressive disorder, recurrent, in partial remission: Secondary | ICD-10-CM

## 2019-12-26 DIAGNOSIS — I10 Essential (primary) hypertension: Secondary | ICD-10-CM | POA: Diagnosis not present

## 2019-12-26 DIAGNOSIS — M5136 Other intervertebral disc degeneration, lumbar region: Secondary | ICD-10-CM

## 2019-12-26 DIAGNOSIS — N182 Chronic kidney disease, stage 2 (mild): Secondary | ICD-10-CM

## 2019-12-26 DIAGNOSIS — R7309 Other abnormal glucose: Secondary | ICD-10-CM

## 2019-12-26 MED ORDER — AMOXICILLIN 500 MG PO TABS: 500.0000 mg | ORAL_TABLET | Freq: Two times a day (BID) | ORAL | 1 refills | Status: DC

## 2019-12-26 NOTE — Patient Instructions (Signed)
   INFORMATION ABOUT YOUR XRAY [facility] Can walk into [address] for an Insurance account manager. They will have the order and take you back. You do not any paper work, I should get the result back today or tomorrow.   Can call [phone] to schedule an appointment if you wish.    Sent in refill for antibiotic for you to use for dental abscess.   B12 deficency can cause striations on fingernail beds.  You can take B12 supplement from any pharmacy.  Get the kind that melts in your mouth.
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
## 2019-12-27 IMAGING — CT CT CHEST LUNG CANCER SCREENING LOW DOSE
1 series · 15 of 31 positions shown, 19 images · non-contrast
Comparison: 09/08/2017.

CLINICAL DATA: Lung cancer screening 40 pack-year history. Former
asymptomatic smoker.

EXAM:
CT CHEST WITHOUT CONTRAST LOW-DOSE FOR LUNG CANCER SCREENING
TECHNIQUE: Multidetector CT imaging of the chest was performed following the
standard protocol without IV contrast.

[Series 2: ldct screening <30 bmi · axial · 0.68mm/px · z∈[-308,-18]mm · 15 of 64 slices shown, 19 images]
[im 3/64  mediastinal]
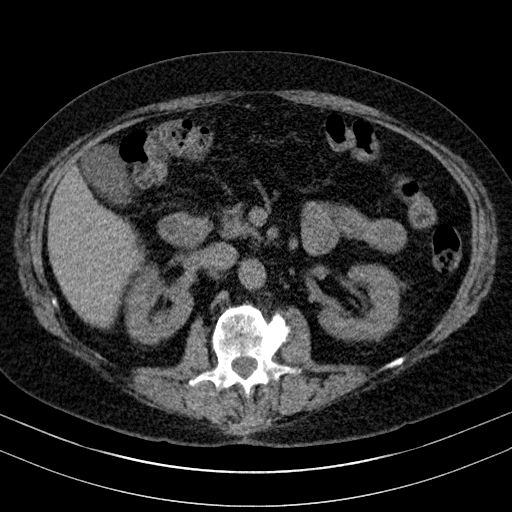
[im 3/64  lung]
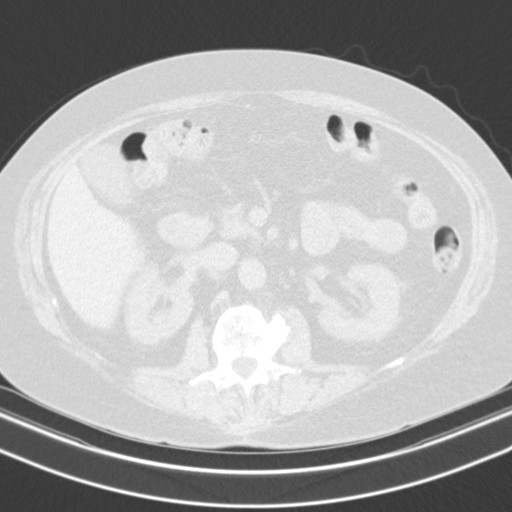
[im 8/64  lung]
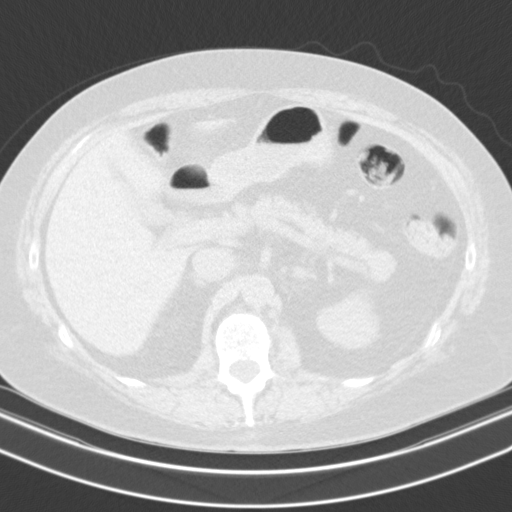
[im 12/64  lung]
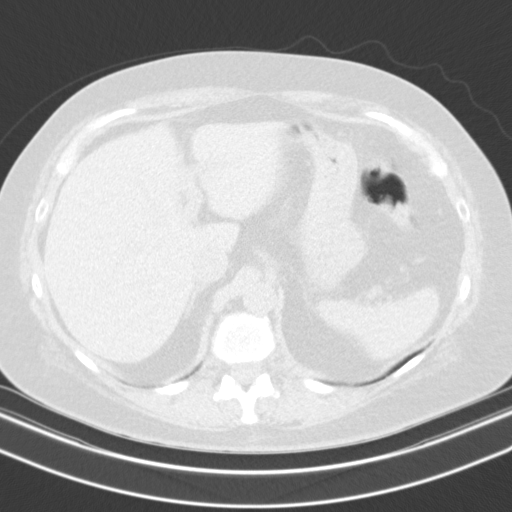
[im 15/64  lung]
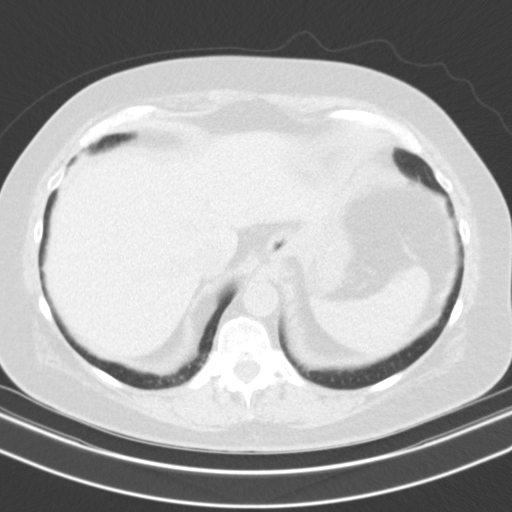
[im 19/64  mediastinal]
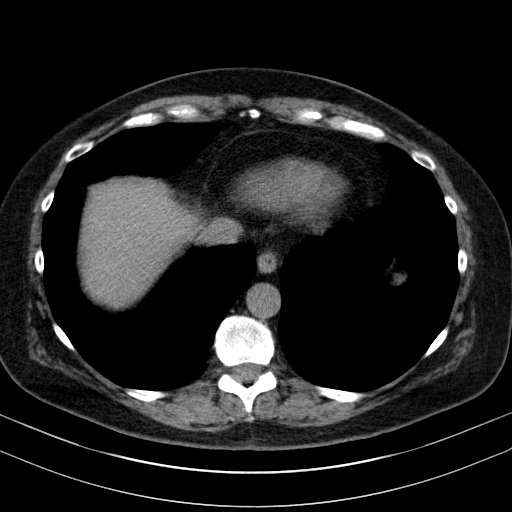
[im 19/64  lung]
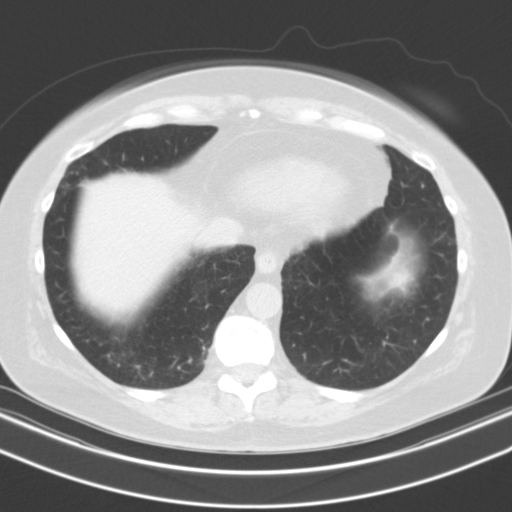
[im 24/64  lung]
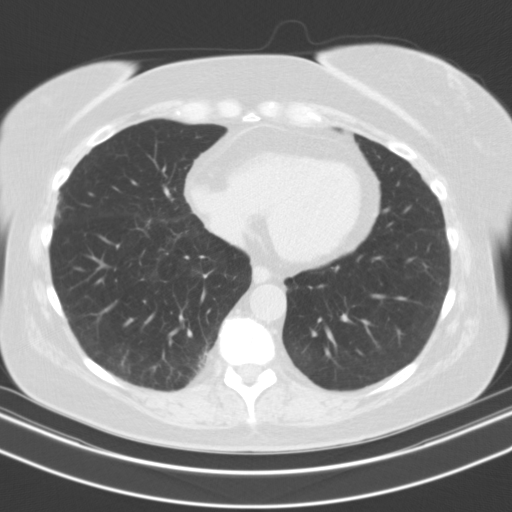
[im 29/64  lung]
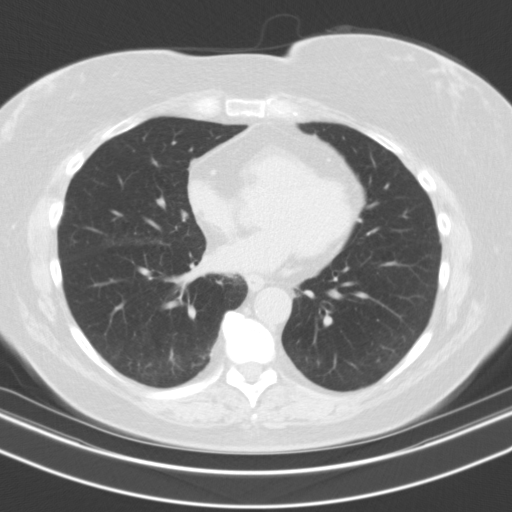
[im 33/64  lung]
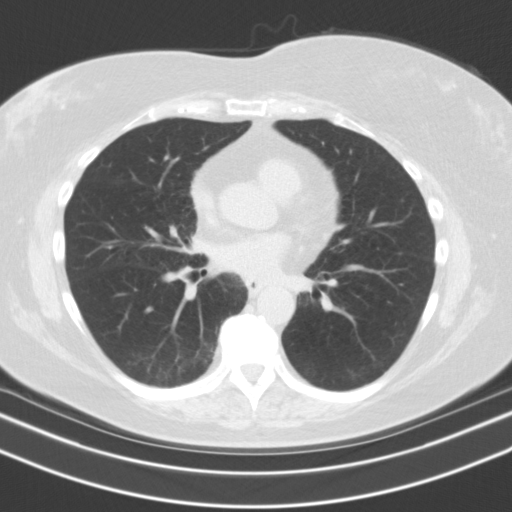
[im 36/64  mediastinal]
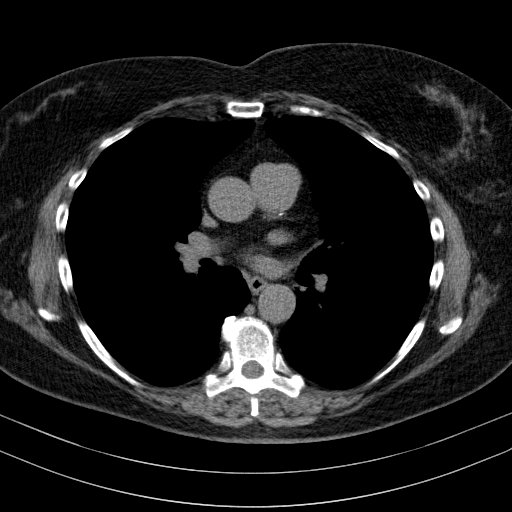
[im 36/64  lung]
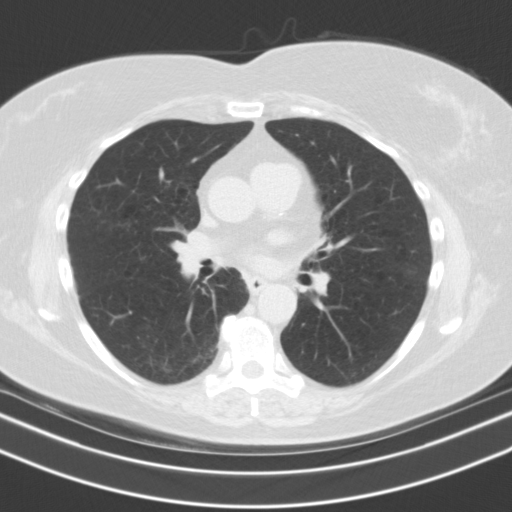
[im 40/64  lung]
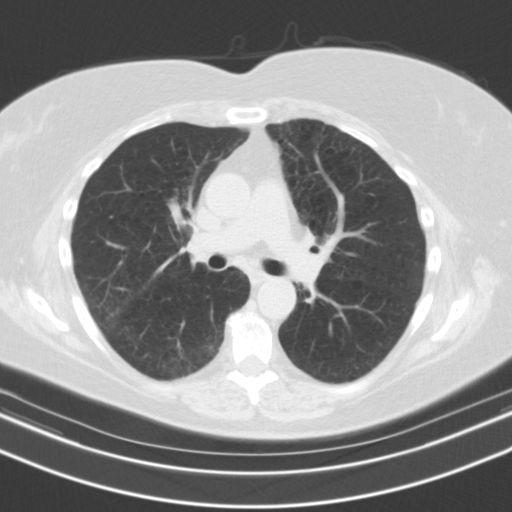
[im 45/64  lung]
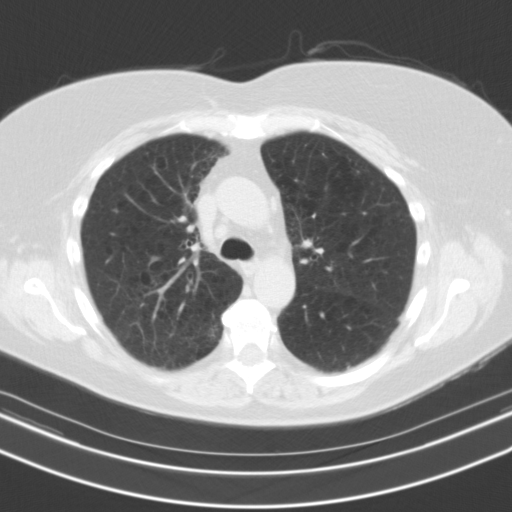
[im 50/64  lung]
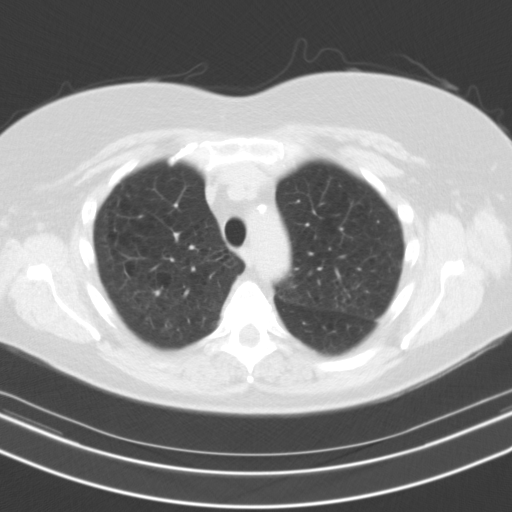
[im 52/64  mediastinal]
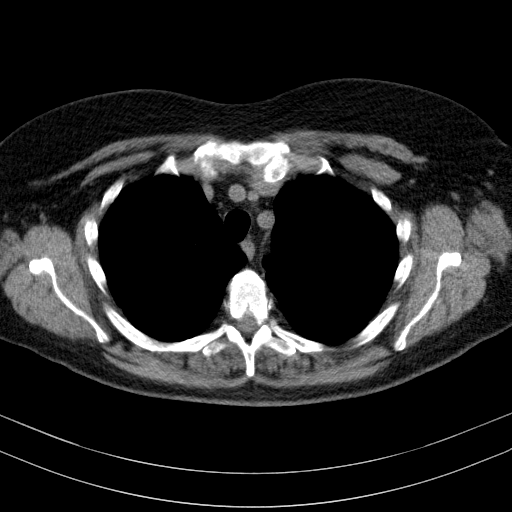
[im 52/64  lung]
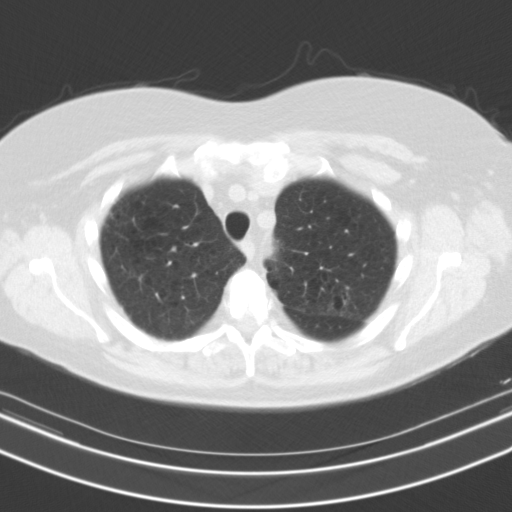
[im 57/64  lung]
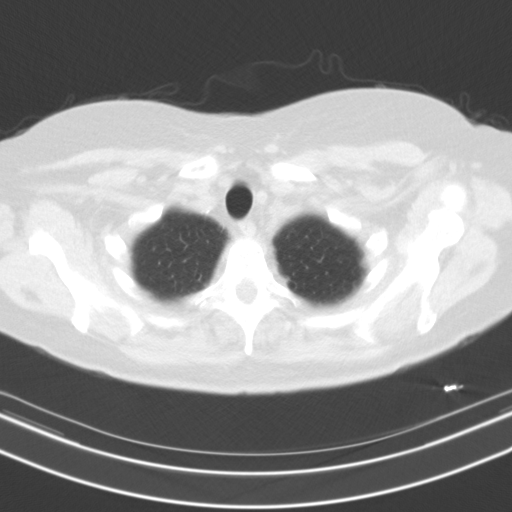
[im 61/64  lung]
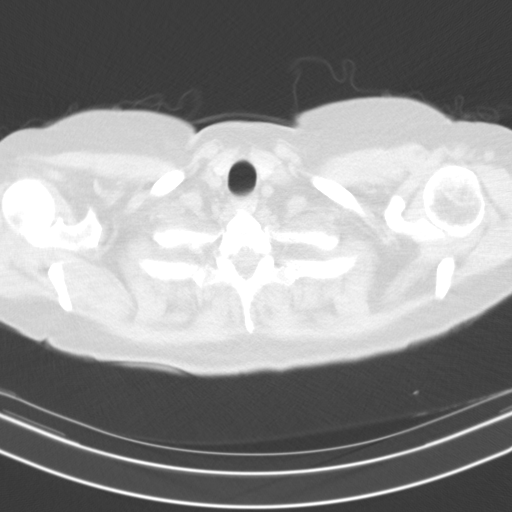

[15 of 31 positions shown; findings below may reference images not displayed]

FINDINGS: Cardiovascular: Normal heart size. Calcification in the LAD coronary
artery noted. Aortic atherosclerosis.

Mediastinum/Nodes: No enlarged mediastinal, hilar, or axillary lymph
nodes. Thyroid gland, trachea, and esophagus demonstrate no
significant findings.

Lungs/Pleura: Moderate emphysema. No pleural effusion no airspace
consolidation, atelectasis or pneumothorax. Scattered areas of
peripheral and lower lobe predominant interstitial reticulation
identified. Likely postinflammatory. Stable perifissural lung nodule
within the right middle lobe measuring 2.4 mm. No new pulmonary
nodules.

Upper Abdomen: No acute abnormality.

Musculoskeletal: No chest wall mass or suspicious bone lesions
identified. Bilateral humeral head a vascular necrosis.
IMPRESSION: 1. Lung-RADS 2, benign appearance or behavior. Continue annual
screening with low-dose chest CT without contrast in 12 months.
2. Aortic Atherosclerosis (0KBSS-A3Z.Z) and Emphysema (0KBSS-CFW.3).
3. Coronary artery atherosclerotic calcifications.
4. Bilateral humeral head a vascular necrosis.

## 2019-12-28 ENCOUNTER — Other Ambulatory Visit: Payer: Self-pay | Admitting: Internal Medicine

## 2019-12-28 DIAGNOSIS — Z1231 Encounter for screening mammogram for malignant neoplasm of breast: Secondary | ICD-10-CM

## 2019-12-31 ENCOUNTER — Ambulatory Visit
Admission: RE | Admit: 2019-12-31 | Discharge: 2019-12-31 | Disposition: A | Discharge status: Home / Self Care | Payer: Medicare Other | Source: Ambulatory Visit | Attending: Adult Health Nurse Practitioner | Admitting: Adult Health Nurse Practitioner

## 2019-12-31 DIAGNOSIS — M79671 Pain in right foot: Secondary | ICD-10-CM | POA: Diagnosis not present

## 2020-01-01 ENCOUNTER — Other Ambulatory Visit: Payer: Self-pay | Admitting: Adult Health Nurse Practitioner

## 2020-01-01 DIAGNOSIS — M8580 Other specified disorders of bone density and structure, unspecified site: Secondary | ICD-10-CM

## 2020-01-02 ENCOUNTER — Other Ambulatory Visit: Payer: Self-pay | Admitting: Internal Medicine

## 2020-01-07 ENCOUNTER — Other Ambulatory Visit: Payer: Self-pay

## 2020-01-07 DIAGNOSIS — F3341 Major depressive disorder, recurrent, in partial remission: Secondary | ICD-10-CM

## 2020-01-07 MED ORDER — DULOXETINE HCL 60 MG PO CPEP: 60.0000 mg | ORAL_CAPSULE | Freq: Every day | ORAL | 2 refills | Status: DC

## 2020-01-07 NOTE — Progress Notes (Signed)
PATIENT IS AWARE OF FOOT XRAY RESULTS AND IS GOING TO CALL AND SCHEDULE HER BMD FOR December 28th at the same time as her mammogram. -e welch

## 2020-01-18 DIAGNOSIS — Z79891 Long term (current) use of opiate analgesic: Secondary | ICD-10-CM | POA: Diagnosis not present

## 2020-01-18 DIAGNOSIS — G894 Chronic pain syndrome: Secondary | ICD-10-CM | POA: Diagnosis not present

## 2020-01-18 DIAGNOSIS — M4326 Fusion of spine, lumbar region: Secondary | ICD-10-CM | POA: Diagnosis not present

## 2020-01-18 DIAGNOSIS — M5106 Intervertebral disc disorders with myelopathy, lumbar region: Secondary | ICD-10-CM | POA: Diagnosis not present

## 2020-01-18 DIAGNOSIS — Z79899 Other long term (current) drug therapy: Secondary | ICD-10-CM | POA: Diagnosis not present

## 2020-02-12 ENCOUNTER — Other Ambulatory Visit: Payer: Self-pay

## 2020-02-12 ENCOUNTER — Ambulatory Visit
Admission: RE | Admit: 2020-02-12 | Discharge: 2020-02-12 | Disposition: A | Discharge status: Home / Self Care | Payer: Medicare Other | Source: Ambulatory Visit | Attending: Internal Medicine | Admitting: Internal Medicine

## 2020-02-12 DIAGNOSIS — Z1231 Encounter for screening mammogram for malignant neoplasm of breast: Secondary | ICD-10-CM | POA: Diagnosis not present

## 2020-02-22 DIAGNOSIS — G894 Chronic pain syndrome: Secondary | ICD-10-CM | POA: Diagnosis not present

## 2020-02-22 DIAGNOSIS — Z6824 Body mass index (BMI) 24.0-24.9, adult: Secondary | ICD-10-CM | POA: Diagnosis not present

## 2020-02-22 DIAGNOSIS — M4326 Fusion of spine, lumbar region: Secondary | ICD-10-CM | POA: Diagnosis not present

## 2020-03-06 DIAGNOSIS — H524 Presbyopia: Secondary | ICD-10-CM | POA: Diagnosis not present

## 2020-03-06 DIAGNOSIS — H43813 Vitreous degeneration, bilateral: Secondary | ICD-10-CM | POA: Diagnosis not present

## 2020-03-06 DIAGNOSIS — H52203 Unspecified astigmatism, bilateral: Secondary | ICD-10-CM | POA: Diagnosis not present

## 2020-03-25 DIAGNOSIS — M5106 Intervertebral disc disorders with myelopathy, lumbar region: Secondary | ICD-10-CM | POA: Diagnosis not present

## 2020-03-25 DIAGNOSIS — G894 Chronic pain syndrome: Secondary | ICD-10-CM | POA: Diagnosis not present

## 2020-03-25 DIAGNOSIS — M4326 Fusion of spine, lumbar region: Secondary | ICD-10-CM | POA: Diagnosis not present

## 2020-03-25 DIAGNOSIS — Z6824 Body mass index (BMI) 24.0-24.9, adult: Secondary | ICD-10-CM | POA: Diagnosis not present

## 2020-03-28 ENCOUNTER — Other Ambulatory Visit: Payer: Medicare Other

## 2020-03-31 ENCOUNTER — Ambulatory Visit (INDEPENDENT_AMBULATORY_CARE_PROVIDER_SITE_OTHER): Payer: Medicare Other

## 2020-03-31 ENCOUNTER — Other Ambulatory Visit: Payer: Self-pay

## 2020-03-31 DIAGNOSIS — I10 Essential (primary) hypertension: Secondary | ICD-10-CM

## 2020-03-31 NOTE — Progress Notes (Signed)
Patient presents to the office for a blood pressure check. Not currently taking any BP meds. Really concerned about elevated readings as high as 180/100. Today's reading in the office was 142/90. Had a headache once on 02/11 with a reading of 171/108. States that her sodium intake has been high due to olive consumption. Scheduled to be seen tomorrow in the office. ED precautions advised.

## 2020-04-01 ENCOUNTER — Ambulatory Visit (INDEPENDENT_AMBULATORY_CARE_PROVIDER_SITE_OTHER): Payer: Medicare Other | Admitting: Adult Health Nurse Practitioner

## 2020-04-01 ENCOUNTER — Encounter: Payer: Self-pay | Admitting: Adult Health Nurse Practitioner

## 2020-04-01 VITALS — HR 86 | Temp 98.4°F | Ht 64.0 in | Wt 168.0 lb

## 2020-04-01 DIAGNOSIS — R7309 Other abnormal glucose: Secondary | ICD-10-CM

## 2020-04-01 DIAGNOSIS — I10 Essential (primary) hypertension: Secondary | ICD-10-CM

## 2020-04-01 MED ORDER — OLMESARTAN MEDOXOMIL 20 MG PO TABS: 40.0000 mg | ORAL_TABLET | Freq: Every day | ORAL | 11 refills | Status: DC

## 2020-04-01 MED ORDER — OLMESARTAN MEDOXOMIL 20 MG PO TABS: ORAL_TABLET | ORAL | 11 refills | Status: DC

## 2020-04-01 NOTE — Progress Notes (Signed)
Assessment and Plan:  Amber Hickman was seen today for hypertension.  Diagnoses and all orders for this visit:   Essential hypertension Continue current medications: Olmesartan 20mg  daily Discussed medication and side effeects Monitor blood pressure at home; call if consistently over 130/80 Continue DASH diet.   Reminder to go to the ER if any CP, SOB, nausea, dizziness, severe HA, changes vision/speech, left arm numbness and tingling and jaw pain. -     CBC with Differential/Platelet -     COMPLETE METABOLIC PANEL WITH GFR -     TSH -     Discontinue: olmesartan (BENICAR) 20 MG tablet; Take 2 tablets (40 mg total) by mouth daily. -     olmesartan (BENICAR) 20 MG tablet; Take one tablet by mouth daily.  Abnormal glucose Discussed dietary and exercise modifications   Close follow up in two weeks to review B/P log.  Discussed goals with patient and prnted education sheet provided.     Further disposition pending results if labs check today. Discussed med's effects and SE's.   Over 30 minutes of face to face interview, exam, counseling, chart review, and critical decision making was performed.    Future Appointments  Date Time Provider [place]  04/14/2020 11:30 AM Unk Pinto, MD [facility] None  04/17/2020 12:00 PM [facility] DX DEXA 1 [facility]  09/22/2020  3:00 PM Luise Yamamoto, NP [facility] None    ------------------------------------------------------------------------------------------------------------------    HPI 65 y.o.female presents for follow up on blood pressure.  She has been checking her blood pressure and home and has a log with her today.  Her blood pressure ranges from 150's-170's over 90-100's.  She reports she has gained weight and has not been eating well.  She reports that she has not bee drinking as much water as normal either.   She is asymptomatic.  She has been consuming high volumes of olives from a jar and approximately three cups  of coffee a day.  Reports she was on blood pressure medication over 20years ago, unable to recall which medication.  Discussed starting medication to lower this and continuing to monitor blood pressure.  Plan to return in two weeks with log.  Discussed olmesartan and side effects of medication.  Contact office with any new or worsening symptoms.    Past Medical History:  Diagnosis Date  . Allergy   . Arthritis   . AVN (avascular necrosis of bone) (HCC)    Left shoulder  . COPD (chronic obstructive pulmonary disease) ([place])   . Depression   . GERD (gastroesophageal reflux disease)   . Hyperlipidemia   . Hypertension   . Memory disorder 12/23/2016  . TIA (transient ischemic attack) 09/30/2017  . Vitamin D deficiency      Allergies  Allergen Reactions  . Augmentin [Amoxicillin-Pot Clavulanate] Nausea And Vomiting  . Codeine Nausea And Vomiting  . Zoloft [Sertraline Hcl]     Pt does not recall reaction   . Lipitor [Atorvastatin] Other (See Comments)    Leg pain    Current Outpatient Medications on File Prior to Visit  Medication Sig  . acetaminophen (TYLENOL) 650 MG CR tablet Take by mouth. Takes 2 tablets daily.  Marland Kitchen amoxicillin (AMOXIL) 500 MG tablet Take 1 tablet (500 mg total) by mouth 2 (two) times daily. 10 days  . baclofen (LIORESAL) 20 MG tablet Take 20 mg by mouth 3 (three) times daily as needed for muscle spasms.  . celecoxib (CELEBREX) 200 MG capsule Take 1 capsule (200 mg  total) by mouth 2 (two) times daily.  . cholecalciferol 5000 units TABS Take 2 tablets daily  . ketotifen (ZADITOR) 0.025 % ophthalmic solution Place 1 drop into both eyes 3 (three) times daily as needed (allergies).  Marland Kitchen oxyCODONE (ROXICODONE) 15 MG immediate release tablet Take 15 mg by mouth every 6 (six) hours as needed for pain.    No current facility-administered medications on file prior to visit.    ROS: all negative except above.   Physical Exam:  Pulse 86   Temp 98.4 F (36.9 C)   Ht  5\' 4"  (1.626 m)   Wt 168 lb (76.2 kg)   SpO2 96%   BMI 28.84 kg/m   General Appearance: Well nourished, in no apparent distress. Eyes: PERRLA, EOMs, conjunctiva no swelling or erythema Sinuses: No Frontal/maxillary tenderness ENT/Mouth: Ext aud canals clear, TMs without erythema, bulging. No erythema, swelling, or exudate on post pharynx.  Tonsils not swollen or erythematous. Hearing normal.  Neck: Supple, thyroid normal.  Respiratory: Respiratory effort normal, BS equal bilaterally without rales, rhonchi, wheezing or stridor.  Cardio: RRR with no MRGs. Brisk peripheral pulses without edema.  Abdomen: Soft, + BS.  Non tender, no guarding, rebound, hernias, masses. Lymphatics: Non tender without lymphadenopathy.  Musculoskeletal: Full ROM, 5/5 strength, normal gait.  Skin: Warm, dry without rashes, lesions, ecchymosis.  Neuro: Cranial nerves intact. Normal muscle tone, no cerebellar symptoms. Sensation intact.  Psych: Awake and oriented X 3, normal affect, Insight and Judgment appropriate.      Garnet Sierras, Laqueta Jean, DNP [facility] 04/01/2020             3:55PM

## 2020-04-01 NOTE — Patient Instructions (Addendum)
   Monitor the amount of salt, sodium, you intake daily.  Decrease canned and pre-packaged foods.  Limit caffeine intake to 1-2 8oz cups a day.  Caffeine also increases blood pressure.  Start taking Olmesartan (Benicar) 20mg  daily.  Continue to monitor blood pressure twice a day and write this down.  Follow up in the office in two weeks with the blood pressure log.  Contact the office with any new or worsening symptoms.    GENERAL HEALTH GOALS  Know what a healthy weight is for you (roughly BMI <25) and aim to maintain this  Aim for 7+ servings of fruits and vegetables daily  70-80+ fluid ounces of water or unsweet tea for healthy kidneys  Limit to max 1 drink of alcohol per day; avoid smoking/tobacco  Limit animal fats in diet for cholesterol and heart health - choose grass fed whenever available  Avoid highly processed foods, and foods high in saturated/trans fats  Aim for low stress - take time to unwind and care for your mental health  Aim for 150 min of moderate intensity exercise weekly for heart health, and weights twice weekly for bone health  Aim for 7-9 hours of sleep daily

## 2020-04-02 LAB — COMPLETE METABOLIC PANEL WITH GFR
AG Ratio: 1.8 (calc) (ref 1.0–2.5)
ALT: 24 U/L (ref 6–29)
AST: 22 U/L (ref 10–35)
Albumin: 4.5 g/dL (ref 3.6–5.1)
Alkaline phosphatase (APISO): 97 U/L (ref 37–153)
BUN: 23 mg/dL (ref 7–25)
CO2: 26 mmol/L (ref 20–32)
Calcium: 9.9 mg/dL (ref 8.6–10.4)
Chloride: 104 mmol/L (ref 98–110)
Creat: 0.79 mg/dL (ref 0.50–0.99)
GFR, Est African American: 91 mL/min/{1.73_m2} (ref >=60)
GFR, Est Non African American: 79 mL/min/{1.73_m2} (ref >=60)
Globulin: 2.5 g/dL (calc) (ref 1.9–3.7)
Glucose, Bld: 91 mg/dL (ref 65–99)
Potassium: 4.8 mmol/L (ref 3.5–5.3)
Sodium: 142 mmol/L (ref 135–146)
Total Bilirubin: 0.3 mg/dL (ref 0.2–1.2)
Total Protein: 7 g/dL (ref 6.1–8.1)

## 2020-04-02 LAB — CBC WITH DIFFERENTIAL/PLATELET
Absolute Monocytes: 672 cells/uL (ref 200–950)
Basophils Absolute: 37 cells/uL (ref 0–200)
Basophils Relative: 0.5 %
Eosinophils Absolute: 117 cells/uL (ref 15–500)
Eosinophils Relative: 1.6 %
HCT: 41.3 % (ref 35.0–45.0)
Hemoglobin: 14 g/dL (ref 11.7–15.5)
Lymphs Abs: 2175 cells/uL (ref 850–3900)
MCH: 31.3 pg (ref 27.0–33.0)
MCHC: 33.9 g/dL (ref 32.0–36.0)
MCV: 92.4 fL (ref 80.0–100.0)
MPV: 10.3 fL (ref 7.5–12.5)
Monocytes Relative: 9.2 %
Neutro Abs: 4300 cells/uL (ref 1500–7800)
Neutrophils Relative %: 58.9 %
Platelets: 293 10*3/uL (ref 140–400)
RBC: 4.47 10*6/uL (ref 3.80–5.10)
RDW: 12.3 % (ref 11.0–15.0)
Total Lymphocyte: 29.8 %
WBC: 7.3 10*3/uL (ref 3.8–10.8)

## 2020-04-02 LAB — TSH: TSH: 0.57 mIU/L (ref 0.40–4.50)

## 2020-04-06 ENCOUNTER — Other Ambulatory Visit: Payer: Self-pay | Admitting: Adult Health

## 2020-04-06 ENCOUNTER — Other Ambulatory Visit: Payer: Self-pay | Admitting: Internal Medicine

## 2020-04-06 DIAGNOSIS — F3341 Major depressive disorder, recurrent, in partial remission: Secondary | ICD-10-CM

## 2020-04-13 NOTE — Patient Instructions (Signed)

## 2020-04-13 NOTE — Progress Notes (Signed)
History of Present Illness:       This very nice 65 y.o.female presents for 3 month follow up with HTN, HLD, Pre-Diabetes, COPD  and Vitamin D Deficiency. Patient  has COPD/ OSA overlap and is on CPAP for with reported improved sleep hygiene.  Patient also is on Prozac for hx/o Depression & is controlled at this point. Patient had Aortic Atherosclerosis by CT scan in Aug 2021.           Patient is treated for HTN (1998) & BP recently had been elevated & patient was started on Olmesartan about 2 weeks ago.    Patient has a list of bid  BP's ranging 120-13/93-94 and sevgeral readings up to 167/96.  Today's BP is at goal - 108/80. Patient has had no complaints of any cardiac type chest pain, palpitations, dyspnea / orthopnea / PND, dizziness, claudication, or dependent edema. Patient is followed in pain management for chronic painfromLumbar DDD & surg x 2 in 2004and and is followed in pain management.       Hyperlipidemia is controlled with diet & Simvastatin. Patient denies myalgias or other med SE's. Last Lipids were at goal:  Lab Results  Component Value Date   CHOL 134 09/19/2019   HDL 56 09/19/2019   LDLCALC 56 09/19/2019   TRIG 139 09/19/2019   CHOLHDL 2.4 09/19/2019    Also, the patient has history of PreDiabetes (A1c 5.8% /2010)and has had no symptoms of reactive hypoglycemia, diabetic polys, paresthesias or visual blurring.  Last A1c was normal & at goal:  Lab Results  Component Value Date   HGBA1C 5.6 09/19/2019       Further, the patient also has history of Vitamin D Deficiency ("20" /2008) and supplements vitamin D without any suspected side-effects. Last vitamin D was at goal:  Lab Results  Component Value Date   VD25OH 99 05/31/2019    Current Outpatient Medications on File Prior to Visit  Medication Sig  . acetaminophen 650 MG CR tablet Take by mouth. Takes 2 tablets daily.  . baclofen  20 MG tablet Take 3  times daily as needed for muscle spasms.  .  celecoxib  200 MG capsule Take 1 capsule  2  times daily.  . cholecalciferol 5000 units TABS Take 2 tablets daily  . DULoxetine  60 MG capsule Take  1 capsule  Daily  for Chronic Pain  . ketotifen (ZADITOR) ophth soln Place 1 drop into eyes 3 times daily as needed   . olmesartan 20 MG tablet Take one tablet daily.  Marland Kitchen oxyCODONE 15 MG  IR  tablet Take  every 6  hours as needed for pain.   . simvastatin  40 MG tablet Take  1 tablet  at Bedtime  for Cholesterol    Allergies  Allergen Reactions  . Augmentin [Amoxicillin-Pot Clavulanate] Nausea And Vomiting  . Codeine Nausea And Vomiting  . Zoloft [Sertraline Hcl]     Pt does not recall reaction   . Lipitor [Atorvastatin] Other (See Comments)    Leg pain    PMHx:   Past Medical History:  Diagnosis Date  . Allergy   . Arthritis   . AVN (avascular necrosis of bone) (HCC)    Left shoulder  . COPD (chronic obstructive pulmonary disease) ([place])   . Depression   . GERD (gastroesophageal reflux disease)   . Hyperlipidemia   . Hypertension   . Memory disorder 12/23/2016  . TIA (transient ischemic attack) 09/30/2017  .  Vitamin D deficiency     Immunization History  Administered Date(s) Administered  . PFIZER(Purple Top)SARS-COV-2 Vaccination 07/02/2019, 07/24/2019  . Td 01/19/2004, 06/05/2015    Past Surgical History:  Procedure Laterality Date  . ANKLE SURGERY Right   . BACK SURGERY    . BREAST EXCISIONAL BIOPSY Right   . CATARACT EXTRACTION Bilateral   . SPINE SURGERY     lumbar  . TONSILLECTOMY AND ADENOIDECTOMY      FHx:    Reviewed / unchanged  SHx:    Reviewed / unchanged   Systems Review:  Constitutional: Denies fever, chills, wt changes, headaches, insomnia, fatigue, night sweats, change in appetite. Eyes: Denies redness, blurred vision, diplopia, discharge, itchy, watery eyes.  ENT: Denies discharge, congestion, post nasal drip, epistaxis, sore throat, earache, hearing loss, dental pain, tinnitus, vertigo, sinus  pain, snoring.  CV: Denies chest pain, palpitations, irregular heartbeat, syncope, dyspnea, diaphoresis, orthopnea, PND, claudication or edema. Respiratory: denies cough, dyspnea, DOE, pleurisy, hoarseness, laryngitis, wheezing.  Gastrointestinal: Denies dysphagia, odynophagia, heartburn, reflux, water brash, abdominal pain or cramps, nausea, vomiting, bloating, diarrhea, constipation, hematemesis, melena, hematochezia  or hemorrhoids. Genitourinary: Denies dysuria, frequency, urgency, nocturia, hesitancy, discharge, hematuria or flank pain. Musculoskeletal: Denies arthralgias, myalgias, stiffness, jt. swelling, pain, limping or strain/sprain.  Skin: Denies pruritus, rash, hives, warts, acne, eczema or change in skin lesion(s). Neuro: No weakness, tremor, incoordination, spasms, paresthesia or pain. Psychiatric: Denies confusion, memory loss or sensory loss. Endo: Denies change in weight, skin or hair change.  Heme/Lymph: No excessive bleeding, bruising or enlarged lymph nodes.  Physical Exam  BP 108/80   Pulse 96   Temp (!) 97.2 F (36.2 C)   Resp 16   Ht 5\' 4"  (1.626 m)   Wt 166 lb (75.3 kg)   SpO2 96%   BMI 28.49 kg/m   Appears  well nourished, well groomed  and in no distress.  Eyes: PERRLA, EOMs, conjunctiva no swelling or erythema. Sinuses: No frontal/maxillary tenderness ENT/Mouth: EAC's clear, TM's nl w/o erythema, bulging. Nares clear w/o erythema, swelling, exudates. Oropharynx clear without erythema or exudates. Oral hygiene is good. Tongue normal, non obstructing. Hearing intact.  Neck: Supple. Thyroid not palpable. Car 2+/2+ without bruits, nodes or JVD. Chest: Respirations nl with BS clear & equal w/o rales, rhonchi, wheezing or stridor.  Cor: Heart sounds normal w/ regular rate and rhythm without sig. murmurs, gallops, clicks or rubs. Peripheral pulses normal and equal  without edema.  Abdomen: Soft & bowel sounds normal. Non-tender w/o guarding, rebound, hernias,  masses or organomegaly.  Lymphatics: Unremarkable.  Musculoskeletal: Full ROM all peripheral extremities, joint stability, 5/5 strength and normal gait.  Skin: Warm, dry without exposed rashes, lesions or ecchymosis apparent.  Neuro: Cranial nerves intact, reflexes equal bilaterally. Sensory-motor testing grossly intact. Tendon reflexes grossly intact.  Pysch: Alert & oriented x 3.  Insight and judgement nl & appropriate. No ideations.  Assessment and Plan:  1. Essential hypertension  - Given sx's Inderal 120 mg XL  (4 mo supply) to take 1 tab every morning and change her Olmesartan 20 to qhs and cut in 1/2 or d/c if BP's drop.  - Continue medication, monitor blood pressures bid at home.  - Continue  diet.  Reminder to go to the ER if any CP,  SOB, nausea, dizziness, severe HA, changes vision/speech.  - Magnesium  2. Hyperlipidemia, mixed  - Continue diet/meds, exercise,& lifestyle modifications.  - Continue monitor periodic cholesterol/liver & renal functions   - Lipid panel  3. Abnormal glucose  - Continue diet, exercise  - Lifestyle modifications.  - Monitor appropriate labs.  - Hemoglobin A1c - Insulin, random  4. Vitamin D deficiency   - Continue supplementation.  - VITAMIN D 25 Hydroxy  5. Depression ([place])   6. Aortic atherosclerosis ([place]) by CT SScan 10/02/2019   7. Medication management  - Magnesium - Lipid panel - Hemoglobin A1c - Insulin, random - VITAMIN D 25 Hydroxy        Discussed  regular exercise, BP monitoring, weight control to achieve/maintain BMI less than 25 and discussed med and SE's. Recommended labs to assess and monitor clinical status with further disposition pending results of labs.  I discussed the assessment and treatment plan with the patient. The patient was provided an opportunity to ask questions and all were answered. The patient agreed with the plan and demonstrated an understanding of the instructions.  I provided over 30  minutes of exam, counseling, chart review and  complex critical decision making.        The patient was advised to call back or seek an in-person evaluation if the symptoms worsen or if the condition fails to improve as anticipated.   Kirtland Bouchard, MD

## 2020-04-14 ENCOUNTER — Ambulatory Visit (INDEPENDENT_AMBULATORY_CARE_PROVIDER_SITE_OTHER): Payer: Medicare Other | Admitting: Internal Medicine

## 2020-04-14 ENCOUNTER — Other Ambulatory Visit: Payer: Self-pay

## 2020-04-14 ENCOUNTER — Encounter: Payer: Self-pay | Admitting: Internal Medicine

## 2020-04-14 VITALS — BP 108/80 | HR 96 | Temp 97.2°F | Resp 16 | Ht 64.0 in | Wt 166.0 lb

## 2020-04-14 DIAGNOSIS — I700 Atherosclerosis of aorta: Secondary | ICD-10-CM

## 2020-04-14 DIAGNOSIS — R7309 Other abnormal glucose: Secondary | ICD-10-CM

## 2020-04-14 DIAGNOSIS — E559 Vitamin D deficiency, unspecified: Secondary | ICD-10-CM | POA: Diagnosis not present

## 2020-04-14 DIAGNOSIS — F3341 Major depressive disorder, recurrent, in partial remission: Secondary | ICD-10-CM

## 2020-04-14 DIAGNOSIS — E782 Mixed hyperlipidemia: Secondary | ICD-10-CM | POA: Diagnosis not present

## 2020-04-14 DIAGNOSIS — Z79899 Other long term (current) drug therapy: Secondary | ICD-10-CM | POA: Diagnosis not present

## 2020-04-14 DIAGNOSIS — I10 Essential (primary) hypertension: Secondary | ICD-10-CM

## 2020-04-15 LAB — LIPID PANEL
Cholesterol: 167 mg/dL (ref <200)
HDL: 67 mg/dL (ref >=50)
LDL Cholesterol (Calc): 77 mg/dL (calc)
Non-HDL Cholesterol (Calc): 100 mg/dL (calc) (ref <130)
Total CHOL/HDL Ratio: 2.5 (calc) (ref <5.0)
Triglycerides: 133 mg/dL (ref <150)

## 2020-04-15 LAB — HEMOGLOBIN A1C
Hgb A1c MFr Bld: 5.8 % of total Hgb — ABNORMAL HIGH (ref <5.7)
Mean Plasma Glucose: 120 mg/dL
eAG (mmol/L): 6.6 mmol/L

## 2020-04-15 LAB — INSULIN, RANDOM: Insulin: 41.8 u[IU]/mL — ABNORMAL HIGH

## 2020-04-15 LAB — MAGNESIUM: Magnesium: 2.7 mg/dL — ABNORMAL HIGH (ref 1.5–2.5)

## 2020-04-15 LAB — VITAMIN D 25 HYDROXY (VIT D DEFICIENCY, FRACTURES): Vit D, 25-Hydroxy: 81 ng/mL (ref 30–100)

## 2020-04-15 NOTE — Progress Notes (Signed)
============================================================  ============================================================   -   Total Chol = 167  and  LDL Chol = 77 - Both  Excellent   - Very low risk for Heart Attack / Stroke  ============================================================  ============================================================   - A1c = 5.8% - is up again in the borderline  or early Diabetes range ,   - So work harder on diet & weight loss   Being diabetic has a 300% increased risk for heart attack,  stroke, cancer, and alzheimer- type vascular dementia.   It is very important that you work harder with diet by  avoiding all foods that are white except chicken, fish & calliflower.   - Avoid white rice  (brown & wild rice is OK),   - Avoid white potatoes  (sweet potatoes in moderation is OK),   White bread or wheat bread or anything made out of   white flour like bagels, donuts, rolls, buns, biscuits, cakes,   - pastries, cookies, pizza crust, and pasta (made from  white flour & egg whites)   - vegetarian pasta or spinach or wheat pasta is OK.   - Multigrain breads like Arnold's, Pepperidge Farm or   multigrain sandwich thins or high fiber breads like   [place] bread or "Dave's Killer" breads that are  4 to 5 grams fiber per slice ! are best.    Diet, exercise and weight loss can reverse and cure  diabetes in the early stages.   ============================================================  ============================================================   - Vitamin D = 81 - Excellent  ============================================================  ============================================================   -   All Else - CBC - Kidneys - Electrolytes - Liver - Magnesium & Thyroid    - all  Normal / OK ============================================================ ============================================================

## 2020-04-17 ENCOUNTER — Other Ambulatory Visit: Payer: Medicare Other

## 2020-04-17 ENCOUNTER — Telehealth: Payer: Self-pay

## 2020-04-17 NOTE — Telephone Encounter (Signed)
Patient questioning if there is any other place besides [facility] that can do her Bone Density test?

## 2020-04-18 NOTE — Telephone Encounter (Signed)
patient requests DEXA order be sent to [facility]. Faxed

## 2020-04-22 DIAGNOSIS — G894 Chronic pain syndrome: Secondary | ICD-10-CM | POA: Diagnosis not present

## 2020-04-22 DIAGNOSIS — M4326 Fusion of spine, lumbar region: Secondary | ICD-10-CM | POA: Diagnosis not present

## 2020-04-22 DIAGNOSIS — M5106 Intervertebral disc disorders with myelopathy, lumbar region: Secondary | ICD-10-CM | POA: Diagnosis not present

## 2020-04-30 ENCOUNTER — Telehealth: Payer: Self-pay

## 2020-04-30 NOTE — Telephone Encounter (Signed)
Informed patient that it's ok to follow thru with her dentist appt. PER PROVIDER.  Blood pressure for the last 4 days March 13th: 119/69 March 14th: no blood pressure checked March 15th: 125/75 March 16th: 125/71

## 2020-04-30 NOTE — Telephone Encounter (Signed)
-----   Message from Garnet Sierras, NP sent at 04/30/2020 12:11 PM EDT ----- Regarding: RE: PHONE/FRONT OFFICE MESSAGE Contact: [phone] What are her current blood pressure readings?  Sincerely,           Garnet Sierras, NP     ----- Message ----- From: Elenor Quinones, CMA Sent: 04/30/2020  11:49 AM EDT To: Garnet Sierras, NP Subject: PHONE/FRONT OFFICE MESSAGE                     HAVING  a tooth extraction tomorrow.  Wants to know if it's ok due to her past Blood Pressure concerns?

## 2020-05-20 DIAGNOSIS — M5106 Intervertebral disc disorders with myelopathy, lumbar region: Secondary | ICD-10-CM | POA: Diagnosis not present

## 2020-05-20 DIAGNOSIS — G894 Chronic pain syndrome: Secondary | ICD-10-CM | POA: Diagnosis not present

## 2020-05-20 DIAGNOSIS — M4326 Fusion of spine, lumbar region: Secondary | ICD-10-CM | POA: Diagnosis not present

## 2020-05-22 ENCOUNTER — Other Ambulatory Visit: Payer: Medicare Other

## 2020-05-30 ENCOUNTER — Other Ambulatory Visit: Payer: Self-pay | Admitting: Orthopaedic Surgery

## 2020-05-30 DIAGNOSIS — M4326 Fusion of spine, lumbar region: Secondary | ICD-10-CM

## 2020-06-04 ENCOUNTER — Other Ambulatory Visit: Payer: Self-pay

## 2020-06-04 ENCOUNTER — Ambulatory Visit
Admission: RE | Admit: 2020-06-04 | Discharge: 2020-06-04 | Disposition: A | Discharge status: Home / Self Care | Payer: Medicare Other | Source: Ambulatory Visit | Attending: Orthopaedic Surgery | Admitting: Orthopaedic Surgery

## 2020-06-04 DIAGNOSIS — M47816 Spondylosis without myelopathy or radiculopathy, lumbar region: Secondary | ICD-10-CM | POA: Diagnosis not present

## 2020-06-04 DIAGNOSIS — M4326 Fusion of spine, lumbar region: Secondary | ICD-10-CM

## 2020-06-04 MED ORDER — GADOBENATE DIMEGLUMINE 529 MG/ML IV SOLN
15.0000 mL | Freq: Once | INTRAVENOUS | Status: AC | PRN
  Administered 2020-06-04: 15 mL via INTRAVENOUS

## 2020-06-12 ENCOUNTER — Other Ambulatory Visit: Payer: Medicare Other

## 2020-06-26 DIAGNOSIS — G894 Chronic pain syndrome: Secondary | ICD-10-CM | POA: Diagnosis not present

## 2020-06-26 DIAGNOSIS — M791 Myalgia, unspecified site: Secondary | ICD-10-CM | POA: Diagnosis not present

## 2020-06-26 DIAGNOSIS — M4326 Fusion of spine, lumbar region: Secondary | ICD-10-CM | POA: Diagnosis not present

## 2020-06-26 DIAGNOSIS — M47816 Spondylosis without myelopathy or radiculopathy, lumbar region: Secondary | ICD-10-CM | POA: Diagnosis not present

## 2020-07-07 ENCOUNTER — Other Ambulatory Visit: Payer: Self-pay | Admitting: Internal Medicine

## 2020-07-07 DIAGNOSIS — F3341 Major depressive disorder, recurrent, in partial remission: Secondary | ICD-10-CM

## 2020-07-07 MED ORDER — DULOXETINE HCL 60 MG PO CPEP: ORAL_CAPSULE | ORAL | 3 refills | Status: DC

## 2020-07-07 MED ORDER — SIMVASTATIN 40 MG PO TABS: ORAL_TABLET | ORAL | 3 refills | Status: DC

## 2020-07-15 DIAGNOSIS — Z86010 Personal history of colonic polyps: Secondary | ICD-10-CM | POA: Insufficient documentation

## 2020-07-15 NOTE — Progress Notes (Signed)
MEDICARE ANNUAL WELLNESS VISIT AND 3 FOLLOW UP Assessment:    Encounter for Medicare annual wellness exam Yearly  Essential hypertension - continue medications, DASH diet, exercise and monitor at home. Call if greater than 130/80.  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 Patrice Paradise  Former Tobacco use disorder Still not smoking Encouraged healthy behaviors - low dose CT lung cancer screening due in August 2022 - order placed  Other abnormal glucose (prediabetes) Discussed disease and risks Discussed diet/exercise, weight management  Check A1C q56m; CMP for glucose otherwise  Mixed hyperlipidemia Continue medications: Simvastatin 40mg  nightly Discussed dietary and exercise modifications Low fat diet  Vitamin D deficiency Continue supplementation Taking Vitamin D 5,000 IU daily At goal last check - defer today   Medication management Continued  History of TIA Continue follow up with Dr. Jannifer Franklin  Overweight - BMI 28 Discussed dietary and exercise modifications  R leg cool/diminished  pulses ABI/arterial duplex ordered to schedule ASAP Denies claudication, no black toe   Over 40 minutes of face to face interview, exam, counseling, chart review, and critical decision making was performed  Future Appointments  Date Time Provider [place]  09/23/2020  2:00 PM Unk Pinto, MD [facility] None  07/16/2021 11:00 AM Liane Comber, NP [facility] None     Plan:   During the course of the visit the patient was educated and counseled about appropriate screening and preventive services including:    Pneumococcal vaccine   Influenza vaccine  Prevnar 13  Td vaccine  Screening electrocardiogram  Colorectal cancer screening  Diabetes screening  Glaucoma screening  Nutrition counseling    Subjective:  Amber Hickman is a 65 y.o. female who presents for Medicare Annual Wellness Visit and 3 month follow up for HTN, HLD prediabetes, and vitamin D Def.   Amber Hickman has OSA and is not on CPAP due to intolerance, sleeps well when weight is down, declined referral back to sleep medicine.   Amber Hickman is a former smoker, quit in 2019 with 30 pack year hx.  Low dose CT lung screening 10/02/2019 was benign but showed emphysematous changes and aortic atherosclerosis.   Fused L3 down. 06/23/19 is follow up with Dr Patrice Paradise. Amber Hickman sees pain management for her back pain, Dr. Marnee Guarneri, gets injections.  They are prescribing her pain medications (oxycodone 15 mg q6h PRN), takes 1-4/day, recently pain is worse. Has residual RLE weakness/foot drop and calf wasting. Reports has discussed therapy but hasn't been set up yet.   BMI is Body mass index is 28.49 kg/m., Amber Hickman is working on diet, exercise limited by back pain.  Wt Readings from Last 3 Encounters:  07/16/20 166  lb (75.3 kg)  04/14/20 166 lb (75.3 kg)  04/01/20 168 lb (76.2 kg)   Amber Hickman had a normal echo 04/2015, normal stress test 04/2015. Aortic atherosclerosis per CT 09/2019.   Her blood pressure has been labile, ranging  110s/60s to 150/90s, today their BP is BP: 118/74  Amber Hickman does not workout. Amber Hickman denies chest pain, shortness of breath, dizziness.   Amber Hickman is on cholesterol medication (simvastatin 40 mg daily) and denies myalgias. Her cholesterol is at goal. The cholesterol last visit was:   Lab Results  Component Value Date   CHOL 167 04/14/2020   HDL 67 04/14/2020   LDLCALC 77 04/14/2020   TRIG 133 04/14/2020   CHOLHDL 2.5 04/14/2020    Amber Hickman has been working on diet and exercise for prediabetes, and denies increased appetite, nausea, paresthesia of the feet, polydipsia, polyuria and visual disturbances. Last A1C in the office was:  Lab Results  Component Value Date   HGBA1C 5.8 (H) 04/14/2020   Patient is on Vitamin D supplement.   Lab Results  Component Value Date   VD25OH 81 04/14/2020       Medication Review: Current Outpatient Medications on File Prior to Visit  Medication Sig Dispense Refill  . acetaminophen (TYLENOL) 650 MG CR tablet Take by mouth. Takes 2 tablets daily.    . baclofen (LIORESAL) 20 MG tablet Take 20 mg by mouth 3 (three) times daily as needed for muscle spasms.    . cholecalciferol 5000 units TABS Take 2 tablets daily    . DULoxetine (CYMBALTA) 60 MG capsule Take  1 capsule  Daily  for Chronic Pain 90 capsule 3  . ketotifen (ZADITOR) 0.025 % ophthalmic solution Place 1 drop into both eyes 3 (three) times daily as needed (allergies).    . olmesartan (BENICAR) 20 MG tablet Take one tablet by mouth daily. (Patient taking differently: as needed. Take one tablet by mouth daily.) 30 tablet 11  . oxyCODONE (ROXICODONE) 15 MG immediate release tablet Take 15 mg by mouth every 6 (six) hours as needed for pain.   0  . simvastatin (ZOCOR) 40 MG tablet Take  1 tablet  at Bedtime  for Cholesterol 90 tablet 3   No current facility-administered medications on file prior to visit.    Allergies: Allergies  Allergen Reactions  . Augmentin [Amoxicillin-Pot Clavulanate] Nausea And Vomiting   . Codeine Nausea And Vomiting  . Zoloft [Sertraline Hcl]     Pt does not recall reaction   . 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  Screening Tests Immunization History  Administered Date(s) Administered  . PFIZER(Purple Top)SARS-COV-2 Vaccination 07/02/2019, 07/24/2019  . Td 01/19/2004, 06/05/2015    Preventative care: Last colonoscopy: 09/2017, tubular adenoma, Dr. Earlean Shawl, ? 5 year recall, Amber Hickman will call to verify PAP 2011, was advised none further Mammogram 01/2020 DEXA - order in system, given phone number to schedule  Prior vaccinations: TD or Tdap 2017  Influenza: Declined  Pneumococcal: Due, but declined Prevnar13: Declined Shingles/Zostavax: Declined COVID-19: 2/2, 2021, [place]    Names  of Other Physician/Practitioners you currently use: 1. [facility] here for primary care 2. Dr. Katy Fitch, eye doctor, last visit 2022 3. Dr.Sarfan , dentist, last visit 2022  Patient Care Team: Unk Pinto, MD as PCP - General (Internal Medicine) Richmond Campbell, MD as Consulting Physician (Gastroenterology) Delfina Redwood as Referring Physician (Physician Assistant) Mammography, [facility] (Diagnostic Radiology)  Surgical: Amber Hickman  has a past surgical history that includes Cataract extraction (Bilateral); Spine surgery; Ankle surgery (Right); Tonsillectomy and adenoidectomy;  Back surgery; and Breast excisional biopsy (Right). Family Her family history includes COPD in her mother; Cancer in her mother; Heart disease in her father; Hypertension in her mother; Stroke in her mother. Social history  Amber Hickman reports that Amber Hickman quit smoking about 3 years ago. Amber Hickman has a 30.00 pack-year smoking history. Amber Hickman has never used smokeless tobacco. Amber Hickman reports that Amber Hickman does not drink alcohol and does not use drugs.  MEDICARE WELLNESS OBJECTIVES: Physical activity: Current Exercise Habits: The patient does not participate in regular exercise at present, Exercise limited by: orthopedic condition(s);neurologic condition(s) Cardiac risk factors: Cardiac Risk Factors include: advanced age (>70men, >71 women);dyslipidemia;hypertension;smoking/ tobacco exposure;sedentary lifestyle Depression/mood screen:   Depression screen [facility] 2/9 07/16/2020  Decreased Interest 0  Down, Depressed, Hopeless 1  PHQ - 2 Score 1  Altered sleeping -  Tired, decreased energy -  Change in appetite -  Feeling bad or failure about yourself  -  Trouble concentrating -  Moving slowly or fidgety/restless -  Suicidal thoughts -  PHQ-9 Score -  Difficult doing work/chores -    ADLs:  In your present state of health, do you have any difficulty performing the following activities: 07/16/2020  Hearing? N  Vision? N  Difficulty concentrating or making decisions? N  Walking or climbing stairs? N  Comment manages slowly, uses handrails  Dressing or bathing? N  Doing errands, shopping? N  Some recent data might be hidden     Cognitive Testing  Alert? Yes  Normal Appearance?Yes  Oriented to person? Yes  Place? Yes   Time? Yes  Recall of three objects?  Yes  Can perform simple calculations? Yes  Displays appropriate judgment?Yes  Can read the correct time from a watch face?Yes  EOL planning: Does Patient Have a Medical Advance Directive?: Yes Type of Advance Directive: [place]  will Does patient want to make changes to medical advance directive?: No - Patient declined Copy of [place] in Chart?: No - copy requested   Objective:   Today's Vitals   07/16/20 1102  BP: 118/74  Pulse: 95  Temp: (!) 96.4 F (35.8 C)  SpO2: 94%  Weight: 166 lb (75.3 kg)  Height: 5\' 4"  (1.626 m)   Body mass index is 28.49 kg/m.  General appearance: alert, no distress, WD/WN, female HEENT: normocephalic, sclerae anicteric, TMs pearly, nares patent, no discharge or erythema, pharynx normal Oral cavity: MMM, no lesions Neck: supple, no lymphadenopathy, no thyromegaly, no masses Heart: RRR, normal S1, S2, no murmurs Lungs: CTA bilaterally, no wheezes, rhonchi, or rales Abdomen: +bs, soft, non tender, non distended, no masses, no hepatomegaly, no splenomegaly Musculoskeletal: nontender, no swelling, does have obvious atrophy to calf.  Tenderness to bilateral lumbar, no CVA tenderness noted.  Extremities: no edema, no cyanosis, no clubbing, right leg with some atrophy and decrease muscle compared to left.  Pulses: 2+ symmetric, upper extremities, RLE thready pulses and cold lower leg, does blanch/mildly slow cap refill. No wounds. Neurological:  alert, oriented x 3, CN2-12 intact, strength normal upper extremities Weakness with ankle extension R, sensation diminished R foot/lower leg, no cerebellar signs, gait slow steady Psychiatric: normal affect, behavior normal, pleasant   Medicare Attestation I have personally reviewed: The patient's medical and social history Their use of alcohol, tobacco or illicit drugs Their current medications and supplements The patient's functional ability including ADLs,fall risks, home safety risks, cognitive, and hearing and visual impairment Diet and physical activities Evidence for depression or mood disorders  The patient's weight, height, BMI, and visual acuity have been recorded in the chart.  I have made referrals,  counseling, and provided education to the patient based on review of the above and I have provided the patient with a written personalized care plan for preventive services.     Izora Ribas, NP   07/16/2020

## 2020-07-16 ENCOUNTER — Ambulatory Visit (INDEPENDENT_AMBULATORY_CARE_PROVIDER_SITE_OTHER): Payer: Medicare Other | Admitting: Adult Health

## 2020-07-16 ENCOUNTER — Encounter: Payer: Self-pay | Admitting: Adult Health

## 2020-07-16 ENCOUNTER — Other Ambulatory Visit: Payer: Self-pay

## 2020-07-16 VITALS — BP 118/74 | HR 95 | Temp 96.4°F | Ht 64.0 in | Wt 166.0 lb

## 2020-07-16 DIAGNOSIS — E2839 Other primary ovarian failure: Secondary | ICD-10-CM

## 2020-07-16 DIAGNOSIS — Z79899 Other long term (current) drug therapy: Secondary | ICD-10-CM

## 2020-07-16 DIAGNOSIS — Z0001 Encounter for general adult medical examination with abnormal findings: Secondary | ICD-10-CM | POA: Diagnosis not present

## 2020-07-16 DIAGNOSIS — N182 Chronic kidney disease, stage 2 (mild): Secondary | ICD-10-CM | POA: Diagnosis not present

## 2020-07-16 DIAGNOSIS — M5136 Other intervertebral disc degeneration, lumbar region: Secondary | ICD-10-CM

## 2020-07-16 DIAGNOSIS — R7309 Other abnormal glucose: Secondary | ICD-10-CM | POA: Diagnosis not present

## 2020-07-16 DIAGNOSIS — E782 Mixed hyperlipidemia: Secondary | ICD-10-CM

## 2020-07-16 DIAGNOSIS — R0989 Other specified symptoms and signs involving the circulatory and respiratory systems: Secondary | ICD-10-CM

## 2020-07-16 DIAGNOSIS — Z0000 Encounter for general adult medical examination without abnormal findings: Secondary | ICD-10-CM

## 2020-07-16 DIAGNOSIS — I700 Atherosclerosis of aorta: Secondary | ICD-10-CM | POA: Diagnosis not present

## 2020-07-16 DIAGNOSIS — G4733 Obstructive sleep apnea (adult) (pediatric): Secondary | ICD-10-CM | POA: Diagnosis not present

## 2020-07-16 DIAGNOSIS — I10 Essential (primary) hypertension: Secondary | ICD-10-CM | POA: Diagnosis not present

## 2020-07-16 DIAGNOSIS — J449 Chronic obstructive pulmonary disease, unspecified: Secondary | ICD-10-CM | POA: Diagnosis not present

## 2020-07-16 DIAGNOSIS — Z860100 Personal history of colon polyps, unspecified: Secondary | ICD-10-CM

## 2020-07-16 DIAGNOSIS — Z87891 Personal history of nicotine dependence: Secondary | ICD-10-CM | POA: Diagnosis not present

## 2020-07-16 DIAGNOSIS — F3341 Major depressive disorder, recurrent, in partial remission: Secondary | ICD-10-CM

## 2020-07-16 DIAGNOSIS — R6889 Other general symptoms and signs: Secondary | ICD-10-CM | POA: Diagnosis not present

## 2020-07-16 DIAGNOSIS — E559 Vitamin D deficiency, unspecified: Secondary | ICD-10-CM

## 2020-07-16 DIAGNOSIS — R209 Unspecified disturbances of skin sensation: Secondary | ICD-10-CM

## 2020-07-16 DIAGNOSIS — E663 Overweight: Secondary | ICD-10-CM

## 2020-07-16 DIAGNOSIS — M51369 Other intervertebral disc degeneration, lumbar region without mention of lumbar back pain or lower extremity pain: Secondary | ICD-10-CM

## 2020-07-16 DIAGNOSIS — G894 Chronic pain syndrome: Secondary | ICD-10-CM | POA: Diagnosis not present

## 2020-07-16 NOTE — Patient Instructions (Signed)
    HOW TO SCHEDULE YOUR BONE DENSITY  [facility]  7 a.m.-6:30 p.m., Monday 7 a.m.-5 p.m., Tuesday-Friday Schedule an appointment by calling [phone].

## 2020-07-17 ENCOUNTER — Other Ambulatory Visit: Payer: Self-pay | Admitting: Adult Health

## 2020-07-17 DIAGNOSIS — N289 Disorder of kidney and ureter, unspecified: Secondary | ICD-10-CM

## 2020-07-17 LAB — CBC WITH DIFFERENTIAL/PLATELET
Absolute Monocytes: 697 cells/uL (ref 200–950)
Basophils Absolute: 41 cells/uL (ref 0–200)
Basophils Relative: 0.5 %
Eosinophils Absolute: 122 cells/uL (ref 15–500)
Eosinophils Relative: 1.5 %
HCT: 42.6 % (ref 35.0–45.0)
Hemoglobin: 14.1 g/dL (ref 11.7–15.5)
Lymphs Abs: 2147 cells/uL (ref 850–3900)
MCH: 31.2 pg (ref 27.0–33.0)
MCHC: 33.1 g/dL (ref 32.0–36.0)
MCV: 94.2 fL (ref 80.0–100.0)
MPV: 10.8 fL (ref 7.5–12.5)
Monocytes Relative: 8.6 %
Neutro Abs: 5095 cells/uL (ref 1500–7800)
Neutrophils Relative %: 62.9 %
Platelets: 328 10*3/uL (ref 140–400)
RBC: 4.52 10*6/uL (ref 3.80–5.10)
RDW: 11.9 % (ref 11.0–15.0)
Total Lymphocyte: 26.5 %
WBC: 8.1 10*3/uL (ref 3.8–10.8)

## 2020-07-17 LAB — COMPLETE METABOLIC PANEL WITH GFR
AG Ratio: 1.7 (calc) (ref 1.0–2.5)
ALT: 23 U/L (ref 6–29)
AST: 25 U/L (ref 10–35)
Albumin: 4.7 g/dL (ref 3.6–5.1)
Alkaline phosphatase (APISO): 94 U/L (ref 37–153)
BUN/Creatinine Ratio: 28 (calc) — ABNORMAL HIGH (ref 6–22)
BUN: 27 mg/dL — ABNORMAL HIGH (ref 7–25)
CO2: 25 mmol/L (ref 20–32)
Calcium: 10.9 mg/dL — ABNORMAL HIGH (ref 8.6–10.4)
Chloride: 100 mmol/L (ref 98–110)
Creat: 0.95 mg/dL (ref 0.50–0.99)
GFR, Est African American: 73 mL/min/{1.73_m2} (ref >=60)
GFR, Est Non African American: 63 mL/min/{1.73_m2} (ref >=60)
Globulin: 2.7 g/dL (calc) (ref 1.9–3.7)
Glucose, Bld: 103 mg/dL — ABNORMAL HIGH (ref 65–99)
Potassium: 4.4 mmol/L (ref 3.5–5.3)
Sodium: 137 mmol/L (ref 135–146)
Total Bilirubin: 0.6 mg/dL (ref 0.2–1.2)
Total Protein: 7.4 g/dL (ref 6.1–8.1)

## 2020-07-17 LAB — MAGNESIUM: Magnesium: 2.3 mg/dL (ref 1.5–2.5)

## 2020-07-17 LAB — TSH: TSH: 1.34 mIU/L (ref 0.40–4.50)

## 2020-07-18 ENCOUNTER — Other Ambulatory Visit: Payer: Self-pay

## 2020-07-18 ENCOUNTER — Ambulatory Visit
Admission: RE | Admit: 2020-07-18 | Discharge: 2020-07-18 | Disposition: A | Discharge status: Home / Self Care | Payer: Medicare Other | Source: Ambulatory Visit | Attending: Adult Health | Admitting: Adult Health

## 2020-07-18 DIAGNOSIS — Z780 Asymptomatic menopausal state: Secondary | ICD-10-CM | POA: Diagnosis not present

## 2020-07-18 DIAGNOSIS — M810 Age-related osteoporosis without current pathological fracture: Secondary | ICD-10-CM | POA: Diagnosis not present

## 2020-07-18 DIAGNOSIS — M85852 Other specified disorders of bone density and structure, left thigh: Secondary | ICD-10-CM | POA: Diagnosis not present

## 2020-07-18 DIAGNOSIS — E2839 Other primary ovarian failure: Secondary | ICD-10-CM

## 2020-07-19 ENCOUNTER — Encounter: Payer: Self-pay | Admitting: Adult Health

## 2020-07-19 ENCOUNTER — Other Ambulatory Visit: Payer: Self-pay | Admitting: Adult Health

## 2020-07-19 DIAGNOSIS — M810 Age-related osteoporosis without current pathological fracture: Secondary | ICD-10-CM | POA: Insufficient documentation

## 2020-07-19 MED ORDER — ALENDRONATE SODIUM 70 MG PO TABS: 70.0000 mg | ORAL_TABLET | ORAL | 3 refills | Status: DC

## 2020-07-24 DIAGNOSIS — Z79891 Long term (current) use of opiate analgesic: Secondary | ICD-10-CM | POA: Diagnosis not present

## 2020-07-24 DIAGNOSIS — Z79899 Other long term (current) drug therapy: Secondary | ICD-10-CM | POA: Diagnosis not present

## 2020-07-24 DIAGNOSIS — M47816 Spondylosis without myelopathy or radiculopathy, lumbar region: Secondary | ICD-10-CM | POA: Diagnosis not present

## 2020-07-24 DIAGNOSIS — M4326 Fusion of spine, lumbar region: Secondary | ICD-10-CM | POA: Diagnosis not present

## 2020-07-24 DIAGNOSIS — G894 Chronic pain syndrome: Secondary | ICD-10-CM | POA: Diagnosis not present

## 2020-08-01 ENCOUNTER — Ambulatory Visit: Payer: Medicare Other

## 2020-08-04 ENCOUNTER — Inpatient Hospital Stay: Admission: RE | Admit: 2020-08-04 | Payer: Medicare Other | Source: Ambulatory Visit

## 2020-08-06 ENCOUNTER — Ambulatory Visit (HOSPITAL_COMMUNITY)
Admission: RE | Admit: 2020-08-06 | Discharge: 2020-08-06 | Disposition: A | Discharge status: Home / Self Care | Payer: Medicare Other | Source: Ambulatory Visit | Attending: Cardiovascular Disease | Admitting: Cardiovascular Disease

## 2020-08-06 ENCOUNTER — Other Ambulatory Visit: Payer: Self-pay

## 2020-08-06 DIAGNOSIS — R209 Unspecified disturbances of skin sensation: Secondary | ICD-10-CM | POA: Diagnosis not present

## 2020-08-06 DIAGNOSIS — R0989 Other specified symptoms and signs involving the circulatory and respiratory systems: Secondary | ICD-10-CM | POA: Insufficient documentation

## 2020-08-13 DIAGNOSIS — M79605 Pain in left leg: Secondary | ICD-10-CM | POA: Diagnosis not present

## 2020-08-13 DIAGNOSIS — M5459 Other low back pain: Secondary | ICD-10-CM | POA: Diagnosis not present

## 2020-08-19 ENCOUNTER — Ambulatory Visit
Admission: RE | Admit: 2020-08-19 | Discharge: 2020-08-19 | Disposition: A | Discharge status: Home / Self Care | Payer: Medicare Other | Source: Ambulatory Visit | Attending: Adult Health | Admitting: Adult Health

## 2020-08-19 ENCOUNTER — Other Ambulatory Visit: Payer: Self-pay | Admitting: Adult Health

## 2020-08-19 DIAGNOSIS — Z87891 Personal history of nicotine dependence: Secondary | ICD-10-CM

## 2020-08-20 DIAGNOSIS — K648 Other hemorrhoids: Secondary | ICD-10-CM | POA: Diagnosis not present

## 2020-08-21 DIAGNOSIS — M4326 Fusion of spine, lumbar region: Secondary | ICD-10-CM | POA: Diagnosis not present

## 2020-08-21 DIAGNOSIS — M47816 Spondylosis without myelopathy or radiculopathy, lumbar region: Secondary | ICD-10-CM | POA: Diagnosis not present

## 2020-08-21 DIAGNOSIS — G894 Chronic pain syndrome: Secondary | ICD-10-CM | POA: Diagnosis not present

## 2020-08-22 DIAGNOSIS — M79605 Pain in left leg: Secondary | ICD-10-CM | POA: Diagnosis not present

## 2020-08-22 DIAGNOSIS — M5459 Other low back pain: Secondary | ICD-10-CM | POA: Diagnosis not present

## 2020-08-26 DIAGNOSIS — M79605 Pain in left leg: Secondary | ICD-10-CM | POA: Diagnosis not present

## 2020-08-26 DIAGNOSIS — M5459 Other low back pain: Secondary | ICD-10-CM | POA: Diagnosis not present

## 2020-08-29 DIAGNOSIS — M79605 Pain in left leg: Secondary | ICD-10-CM | POA: Diagnosis not present

## 2020-08-29 DIAGNOSIS — M5459 Other low back pain: Secondary | ICD-10-CM | POA: Diagnosis not present

## 2020-09-02 ENCOUNTER — Encounter: Payer: Medicare Other | Admitting: Internal Medicine

## 2020-09-02 DIAGNOSIS — M5459 Other low back pain: Secondary | ICD-10-CM | POA: Diagnosis not present

## 2020-09-02 DIAGNOSIS — M79605 Pain in left leg: Secondary | ICD-10-CM | POA: Diagnosis not present

## 2020-09-05 DIAGNOSIS — M79605 Pain in left leg: Secondary | ICD-10-CM | POA: Diagnosis not present

## 2020-09-05 DIAGNOSIS — M5459 Other low back pain: Secondary | ICD-10-CM | POA: Diagnosis not present

## 2020-09-09 DIAGNOSIS — M79605 Pain in left leg: Secondary | ICD-10-CM | POA: Diagnosis not present

## 2020-09-09 DIAGNOSIS — M5459 Other low back pain: Secondary | ICD-10-CM | POA: Diagnosis not present

## 2020-09-18 DIAGNOSIS — M47816 Spondylosis without myelopathy or radiculopathy, lumbar region: Secondary | ICD-10-CM | POA: Diagnosis not present

## 2020-09-18 DIAGNOSIS — G894 Chronic pain syndrome: Secondary | ICD-10-CM | POA: Diagnosis not present

## 2020-09-18 DIAGNOSIS — M4326 Fusion of spine, lumbar region: Secondary | ICD-10-CM | POA: Diagnosis not present

## 2020-09-22 ENCOUNTER — Encounter: Payer: Medicare Other | Admitting: Adult Health Nurse Practitioner

## 2020-09-23 ENCOUNTER — Encounter: Payer: Self-pay | Admitting: Internal Medicine

## 2020-09-23 ENCOUNTER — Other Ambulatory Visit: Payer: Self-pay

## 2020-09-23 ENCOUNTER — Ambulatory Visit (INDEPENDENT_AMBULATORY_CARE_PROVIDER_SITE_OTHER): Payer: Medicare Other | Admitting: Internal Medicine

## 2020-09-23 VITALS — BP 118/78 | HR 83 | Temp 97.9°F | Resp 16 | Ht 64.0 in | Wt 163.9 lb

## 2020-09-23 DIAGNOSIS — I10 Essential (primary) hypertension: Secondary | ICD-10-CM

## 2020-09-23 DIAGNOSIS — E559 Vitamin D deficiency, unspecified: Secondary | ICD-10-CM | POA: Diagnosis not present

## 2020-09-23 DIAGNOSIS — Z0000 Encounter for general adult medical examination without abnormal findings: Secondary | ICD-10-CM | POA: Diagnosis not present

## 2020-09-23 DIAGNOSIS — Z87891 Personal history of nicotine dependence: Secondary | ICD-10-CM | POA: Diagnosis not present

## 2020-09-23 DIAGNOSIS — G4733 Obstructive sleep apnea (adult) (pediatric): Secondary | ICD-10-CM

## 2020-09-23 DIAGNOSIS — E782 Mixed hyperlipidemia: Secondary | ICD-10-CM

## 2020-09-23 DIAGNOSIS — Z0001 Encounter for general adult medical examination with abnormal findings: Secondary | ICD-10-CM

## 2020-09-23 DIAGNOSIS — J449 Chronic obstructive pulmonary disease, unspecified: Secondary | ICD-10-CM

## 2020-09-23 DIAGNOSIS — I700 Atherosclerosis of aorta: Secondary | ICD-10-CM | POA: Diagnosis not present

## 2020-09-23 DIAGNOSIS — F3341 Major depressive disorder, recurrent, in partial remission: Secondary | ICD-10-CM

## 2020-09-23 DIAGNOSIS — R7309 Other abnormal glucose: Secondary | ICD-10-CM

## 2020-09-23 DIAGNOSIS — M5136 Other intervertebral disc degeneration, lumbar region: Secondary | ICD-10-CM

## 2020-09-23 DIAGNOSIS — Z136 Encounter for screening for cardiovascular disorders: Secondary | ICD-10-CM

## 2020-09-23 DIAGNOSIS — Z8249 Family history of ischemic heart disease and other diseases of the circulatory system: Secondary | ICD-10-CM

## 2020-09-23 DIAGNOSIS — Z1211 Encounter for screening for malignant neoplasm of colon: Secondary | ICD-10-CM

## 2020-09-23 DIAGNOSIS — G894 Chronic pain syndrome: Secondary | ICD-10-CM

## 2020-09-23 DIAGNOSIS — Z79899 Other long term (current) drug therapy: Secondary | ICD-10-CM

## 2020-09-23 NOTE — Patient Instructions (Signed)

## 2020-09-23 NOTE — [place] Notes (Signed)
Annual Screening/Preventative Visit & Comprehensive Evaluation &  Examination  Future Appointments  Date Time Provider [place]  09/23/2020  2:00 PM Unk Pinto, MD [facility] None  07/16/2021 11:00 AM Liane Comber, NP [facility] None        This very nice 65 y.o. single WF presents for a Screening /Preventative Visit & comprehensive evaluation and management of multiple medical co-morbidities.  Patient has been followed for HTN, HLD, Prediabetes  and Vitamin D Deficiency. Patient has hx/o  COPD & OSA (off CPAP). Hx/o Major Depression in remission on Prozac. CT scan in Aug 2021 showed Aortic Atherosclerosis.                                                     The patient is followed in pain management and is on SS Disability for chronic pain from Lumbar DDD &  surg x 2 in 2004.                                                         Patient has COPD from 35+ pk years smoking history. Last year,  she underwent LD Screening  & we discussed lung cancer screening again.  She  is agreeable to undergo another screening low dose CT scan of the chest.  I will refer her for a LDCT lung scan. She alleges stopping smoking in March 2019.         Labile HTN predates since 1998. Patient's BP has been controlled at home and patient denies any cardiac symptoms as chest pain, palpitations, shortness of breath, dizziness or ankle swelling. Today's BP is at goal -  118/78.       Patient's hyperlipidemia is controlled with diet and medications. Patient denies myalgias or other medication SE's. Last lipids were   Lab Results  Component Value Date   CHOL 167 04/14/2020   HDL 67 04/14/2020   LDLCALC 77 04/14/2020   TRIG 133 04/14/2020   CHOLHDL 2.5 04/14/2020         Patient has hx/o prediabetes predating since      and patient denies reactive hypoglycemic symptoms, visual blurring, diabetic polys or paresthesias. Last A1c was   Lab Results  Component Value Date   HGBA1C 5.8 (H)  04/14/2020         Finally, patient has history of Vitamin D Deficiency and last Vitamin D was   Lab Results  Component Value Date   VD25OH 42 04/14/2020     Current Outpatient Medications on File Prior to Visit  Medication Sig   acetaminophen  650 MG CR tablet Take by mouth. Takes 2 tablets daily.   baclofen 20 MG tablet Take 3  times daily as needed for muscle spasms.   cholecalciferol 5000 units TABS Take 2 tablets daily   DULoxetine 60 MG capsule Take  1 capsule  Daily  for Chronic Pain   ketotifen (ZADITOR) 0.025 % ophth soln Place 1 drop into  eyes 3 times daily as needed    olmesartan (BENICAR) 20 MG tablet Take one tablet by mouth daily   oxyCODONE  15 MG immediate release tab Take 15 mg by mouth every  6  hours as needed for pain.    simvastatin (ZOCOR) 40 MG tablet Take  1 tablet  at Bedtime  for Cholesterol     Allergies  Allergen Reactions   Augmentin  Nausea And Vomiting   Codeine Nausea And Vomiting   Zoloft [Sertraline Hcl] Pt does not recall reaction    Lipitor [Atorvastatin] Leg pain     Past Medical History:  Diagnosis Date   Allergy    Arthritis    AVN (avascular necrosis of bone) (HCC)    Left shoulder   COPD (chronic obstructive pulmonary disease) (HCC)    Depression    GERD (gastroesophageal reflux disease)    Hyperlipidemia    Hypertension    Memory disorder 12/23/2016   TIA (transient ischemic attack) 09/30/2017   Vitamin D deficiency      Health Maintenance  Topic Date Due   PAP SMEAR-Modifier  01/18/2013   COVID-19 Vaccine (3 - Pfizer risk series) 08/21/2019   PNA vac Low Risk Adult (1 of 2 - PCV13) Never done   Zoster Vaccines- Shingrix (1 of 2) 10/16/2020    MAMMOGRAM  02/11/2022   COLONOSCOPY (Pts 45-18yr Insurance coverage will need to be confirmed)  09/23/2022   TETANUS/TDAP  06/04/2025   DEXA SCAN  Completed   Hepatitis C Screening  Completed   HIV Screening  Completed   HPV VACCINES  Aged Out   INFLUENZA VACCINE   Discontinued    Immunization History  Administered Date(s) Administered   PFIZER SARS-COV-2 Vaccination 07/02/2019, 07/24/2019   Td 01/19/2004, 06/05/2015    Last Colon - 09/22/2017 - Dr MEarlean Shawl- Recc 5 yr   f/u Colon in Aug 2024   Last MGM - 02/12/2020    Past Surgical History:  Procedure Laterality Date   ANKLE SURGERY Right    BACK SURGERY     BREAST EXCISIONAL BIOPSY Right    CATARACT EXTRACTION Bilateral    SPINE SURGERY     lumbar   TONSILLECTOMY AND ADENOIDECTOMY       Family History  Problem Relation Age of Onset   Hypertension Mother    COPD Mother    Stroke Mother    Cancer Mother        breast   Heart disease Father        Age 66 MI   Breast cancer Neg Hx      Social History   Tobacco Use   Smoking status: Former    Packs/day: 1.00    Years: 30.00    Pack years: 30.00    Types: Cigarettes    Quit date: 05/10/2017    Years since quitting: 3.3   Smokeless tobacco: Never  Substance Use Topics   Alcohol use: No   Drug use: No      ROS Constitutional: Denies fever, chills, weight loss/gain, headaches, insomnia,  night sweats, and change in appetite. Does c/o fatigue. Eyes: Denies redness, blurred vision, diplopia, discharge, itchy, watery eyes.  ENT: Denies discharge, congestion, post nasal drip, epistaxis, sore throat, earache, hearing loss, dental pain, Tinnitus, Vertigo, Sinus pain, snoring.  Cardio: Denies chest pain, palpitations, irregular heartbeat, syncope, dyspnea, diaphoresis, orthopnea, PND, claudication, edema Respiratory: denies cough, dyspnea, DOE, pleurisy, hoarseness, laryngitis, wheezing.  Gastrointestinal: Denies dysphagia, heartburn, reflux, water brash, pain, cramps, nausea, vomiting, bloating, diarrhea, constipation, hematemesis, melena, hematochezia, jaundice, hemorrhoids Genitourinary: Denies dysuria, frequency, urgency, nocturia, hesitancy, discharge, hematuria, flank pain Breast: Breast lumps, nipple discharge, bleeding.   Musculoskeletal: Denies arthralgia, myalgia, stiffness, Jt. Swelling,  pain, limp, and strain/sprain. Denies falls. Skin: Denies puritis, rash, hives, warts, acne, eczema, changing in skin lesion Neuro: No weakness, tremor, incoordination, spasms, paresthesia, pain Psychiatric: Denies confusion, memory loss, sensory loss. Denies Depression. Endocrine: Denies change in weight, skin, hair change, nocturia, and paresthesia, diabetic polys, visual blurring, hyper / hypo glycemic episodes.  Heme/Lymph: No excessive bleeding, bruising, enlarged lymph nodes.  Physical Exam  BP 118/78   Pulse 83   Temp 97.9 F (36.6 C)   Resp 16   Ht '5\' 4"'$  (1.626 m)   Wt 163 lb 14.4 oz (74.3 kg)   SpO2 95%   BMI 28.13 kg/m   General Appearance: Over nourished and in no apparent distress.  Eyes: PERRLA, EOMs, conjunctiva no swelling or erythema, normal fundi and vessels. Sinuses: No frontal/maxillary tenderness ENT/Mouth: EACs patent / TMs  nl. Nares clear without erythema, swelling, mucoid exudates. Oral hygiene is good. No erythema, swelling, or exudate. Tongue normal, non-obstructing. Tonsils not swollen or erythematous. Hearing normal.  Neck: Supple, thyroid not palpable. No bruits, nodes or JVD. Respiratory: Respiratory effort normal.  BS equal and clear bilateral without rales, rhonci, wheezing or stridor. Cardio: Heart sounds are normal with regular rate and rhythm and no murmurs, rubs or gallops. Peripheral pulses are normal and equal bilaterally without edema. No aortic or femoral bruits. Chest: symmetric with normal excursions and percussion. Breasts: Symmetric, without lumps, nipple discharge, retractions, or fibrocystic changes.  Abdomen: Flat, soft with bowel sounds active. Nontender, no guarding, rebound, hernias, masses, or organomegaly.  Lymphatics: Non tender without lymphadenopathy.  Musculoskeletal: Full ROM all peripheral extremities, joint stability, 5/5 strength, and normal gait. Skin:  Warm and dry without rashes, lesions, cyanosis, clubbing or  ecchymosis.  Neuro: Cranial nerves intact, reflexes equal bilaterally. Normal muscle tone, no cerebellar symptoms. Sensation intact.  Pysch: Alert and oriented X 3, normal affect, Insight and Judgment appropriate.    Assessment and Plan  1. Annual Preventative Screening Examination   2. Essential hypertension  - EKG 12-Lead - [place], RETROPERITNL ABD,  LTD - CBC with Differential/Platelet - COMPLETE METABOLIC PANEL WITH GFR - Magnesium - TSH  3. Hyperlipidemia, mixed  - EKG 12-Lead - [place], RETROPERITNL ABD,  LTD - Lipid panel - TSH  4. Abnormal glucose  - EKG 12-Lead - [place], RETROPERITNL ABD,  LTD - Hemoglobin A1c - Insulin, random  5. Vitamin D deficiency  - VITAMIN D 25 Hydroxy   6. OSA and COPD overlap syndrome ([place])   7. Depression, major, recurrent, in partial remission (HCC)  - TSH  8. Aortic atherosclerosis ([place]) by CT SScan 10/02/2019  - EKG 12-Lead - [place], RETROPERITNL ABD,  LTD  9. Chronic pain syndrome   10. DDD, lumbar   11. Screening for colorectal cancer  - POC Hemoccult Bld/Stl   12. Screening for ischemic heart disease  - EKG 12-Lead  13. FHx: heart disease  - EKG 12-Lead - [place], RETROPERITNL ABD,  LTD  14. Former smoker (30 pack/year, quit 2019)  - EKG 12-Lead - [place], RETROPERITNL ABD,  LTD  15. Screening for AAA (aortic abdominal aneurysm)  - [place], RETROPERITNL ABD,  LTD  16. Medication management  - Urinalysis, Routine w reflex microscopic - Microalbumin / creatinine urine ratio - CBC with Differential/Platelet - COMPLETE METABOLIC PANEL WITH GFR - Magnesium - Lipid panel - TSH - Hemoglobin A1c - Insulin, random - VITAMIN D 25 Hydroxy          Patient was counseled in prudent diet to achieve/maintain BMI  less than 25 for weight control, BP monitoring, regular exercise and medications. Discussed med's effects and SE's. Screening labs and tests as requested with  regular follow-up as recommended. Over 40 minutes of exam, counseling, chart review and high complex critical decision making was performed.   Kirtland Bouchard, MD

## 2020-09-24 LAB — CBC WITH DIFFERENTIAL/PLATELET
Absolute Monocytes: 653 cells/uL (ref 200–950)
Basophils Absolute: 32 cells/uL (ref 0–200)
Basophils Relative: 0.5 %
Eosinophils Absolute: 179 cells/uL (ref 15–500)
Eosinophils Relative: 2.8 %
HCT: 40.2 % (ref 35.0–45.0)
Hemoglobin: 13 g/dL (ref 11.7–15.5)
Lymphs Abs: 1805 cells/uL (ref 850–3900)
MCH: 30.2 pg (ref 27.0–33.0)
MCHC: 32.3 g/dL (ref 32.0–36.0)
MCV: 93.5 fL (ref 80.0–100.0)
MPV: 10.6 fL (ref 7.5–12.5)
Monocytes Relative: 10.2 %
Neutro Abs: 3731 cells/uL (ref 1500–7800)
Neutrophils Relative %: 58.3 %
Platelets: 324 10*3/uL (ref 140–400)
RBC: 4.3 10*6/uL (ref 3.80–5.10)
RDW: 12.5 % (ref 11.0–15.0)
Total Lymphocyte: 28.2 %
WBC: 6.4 10*3/uL (ref 3.8–10.8)

## 2020-09-24 LAB — URINALYSIS, ROUTINE W REFLEX MICROSCOPIC
Bacteria, UA: NONE SEEN /HPF
Bilirubin Urine: NEGATIVE
Glucose, UA: NEGATIVE
Hgb urine dipstick: NEGATIVE
Ketones, ur: NEGATIVE
Leukocytes,Ua: NEGATIVE
Nitrite: NEGATIVE
RBC / HPF: NONE SEEN /HPF (ref 0–2)
Specific Gravity, Urine: 1.027 (ref 1.001–1.035)
pH: 5.5 (ref 5.0–8.0)

## 2020-09-24 LAB — HEMOGLOBIN A1C
Hgb A1c MFr Bld: 6 % of total Hgb — ABNORMAL HIGH (ref <5.7)
Mean Plasma Glucose: 126 mg/dL
eAG (mmol/L): 7 mmol/L

## 2020-09-24 LAB — LIPID PANEL
Cholesterol: 163 mg/dL (ref <200)
HDL: 65 mg/dL (ref >=50)
LDL Cholesterol (Calc): 80 mg/dL (calc)
Non-HDL Cholesterol (Calc): 98 mg/dL (calc) (ref <130)
Total CHOL/HDL Ratio: 2.5 (calc) (ref <5.0)
Triglycerides: 94 mg/dL (ref <150)

## 2020-09-24 LAB — MICROALBUMIN / CREATININE URINE RATIO
Creatinine, Urine: 199 mg/dL (ref 20–275)
Microalb Creat Ratio: 10 mcg/mg creat (ref <30)
Microalb, Ur: 1.9 mg/dL

## 2020-09-24 LAB — VITAMIN D 25 HYDROXY (VIT D DEFICIENCY, FRACTURES): Vit D, 25-Hydroxy: 85 ng/mL (ref 30–100)

## 2020-09-24 LAB — INSULIN, RANDOM: Insulin: 26.6 u[IU]/mL — ABNORMAL HIGH

## 2020-09-24 LAB — COMPLETE METABOLIC PANEL WITH GFR
AG Ratio: 1.5 (calc) (ref 1.0–2.5)
ALT: 26 U/L (ref 6–29)
AST: 26 U/L (ref 10–35)
Albumin: 4.2 g/dL (ref 3.6–5.1)
Alkaline phosphatase (APISO): 65 U/L (ref 37–153)
BUN/Creatinine Ratio: 27 (calc) — ABNORMAL HIGH (ref 6–22)
BUN: 26 mg/dL — ABNORMAL HIGH (ref 7–25)
CO2: 29 mmol/L (ref 20–32)
Calcium: 10.7 mg/dL — ABNORMAL HIGH (ref 8.6–10.4)
Chloride: 103 mmol/L (ref 98–110)
Creat: 0.95 mg/dL (ref 0.50–1.05)
Globulin: 2.8 g/dL (calc) (ref 1.9–3.7)
Glucose, Bld: 103 mg/dL — ABNORMAL HIGH (ref 65–99)
Potassium: 4.2 mmol/L (ref 3.5–5.3)
Sodium: 142 mmol/L (ref 135–146)
Total Bilirubin: 0.5 mg/dL (ref 0.2–1.2)
Total Protein: 7 g/dL (ref 6.1–8.1)
eGFR: 66 mL/min/{1.73_m2} (ref >=60)

## 2020-09-24 LAB — MICROSCOPIC MESSAGE

## 2020-09-24 LAB — TSH: TSH: 2.34 mIU/L (ref 0.40–4.50)

## 2020-09-24 LAB — MAGNESIUM: Magnesium: 1.9 mg/dL (ref 1.5–2.5)

## 2020-09-24 NOTE — Progress Notes (Signed)
============================================================ -   Test results slightly outside the reference range are not unusual. If there is anything important, I will review this with you,  otherwise it is considered normal test values.  If you have further questions,  please do not hesitate to contact me at the office or via My Chart.  ============================================================ ============================================================  -  Total Chol = 163   and        LDL  Chol = 80    -     both     Excellent   - Very low risk for Heart Attack  / Stroke ============================================================ ============================================================  -  A1c = 6.0% - Blood sugar and A1c are  STILL elevated in the                     borderline and early or pre-diabetes range which has the same   300% increased risk for heart attack, stroke, cancer and                               alzheimer- type vascular dementia as full blown diabetes.   But the good news is that diet, exercise with                                        weight loss can cure the early diabetes at this point. ============================================================ ============================================================  -   It is very important that you work harder with diet by  avoiding all foods that are white except chicken,   fish & calliflower.  - Avoid white rice  (brown & wild rice is OK),   - Avoid white potatoes  (sweet potatoes in moderation is OK),   White bread or wheat bread or anything made out of   white flour like bagels, donuts, rolls, buns, biscuits, cakes,  - pastries, cookies, pizza crust, and pasta (made from  white flour & egg whites)   - vegetarian pasta or spinach or wheat pasta is OK.  - Multigrain breads like Arnold's, Pepperidge Farm or   multigrain sandwich thins or high fiber breads like   [place] bread or "Dave's  Killer" breads that are  4 to 5 grams fiber per slice !  are best.    Diet, exercise and weight loss can reverse and cure  diabetes in the early stages.   ============================================================ ============================================================  -  Vitamin D = 85 - Excellent    ! ============================================================ ============================================================  -  All Else - CBC - Kidneys - Electrolytes - Liver - Magnesium & Thyroid    - all  Normal / OK ============================================================ ============================================================

## 2020-10-07 ENCOUNTER — Emergency Department (HOSPITAL_BASED_OUTPATIENT_CLINIC_OR_DEPARTMENT_OTHER): Payer: Medicare Other

## 2020-10-07 ENCOUNTER — Encounter (HOSPITAL_BASED_OUTPATIENT_CLINIC_OR_DEPARTMENT_OTHER): Payer: Self-pay

## 2020-10-07 ENCOUNTER — Other Ambulatory Visit (HOSPITAL_BASED_OUTPATIENT_CLINIC_OR_DEPARTMENT_OTHER): Payer: Self-pay

## 2020-10-07 ENCOUNTER — Emergency Department (HOSPITAL_BASED_OUTPATIENT_CLINIC_OR_DEPARTMENT_OTHER)
Admission: EM | Admit: 2020-10-07 | Discharge: 2020-10-07 | Disposition: A | Discharge status: Home / Self Care | Payer: Medicare Other | Attending: Emergency Medicine | Admitting: Emergency Medicine

## 2020-10-07 ENCOUNTER — Other Ambulatory Visit: Payer: Self-pay

## 2020-10-07 DIAGNOSIS — S0993XA Unspecified injury of face, initial encounter: Secondary | ICD-10-CM | POA: Diagnosis not present

## 2020-10-07 DIAGNOSIS — S01511A Laceration without foreign body of lip, initial encounter: Secondary | ICD-10-CM | POA: Insufficient documentation

## 2020-10-07 DIAGNOSIS — Z87891 Personal history of nicotine dependence: Secondary | ICD-10-CM | POA: Diagnosis not present

## 2020-10-07 DIAGNOSIS — I10 Essential (primary) hypertension: Secondary | ICD-10-CM | POA: Diagnosis not present

## 2020-10-07 DIAGNOSIS — J449 Chronic obstructive pulmonary disease, unspecified: Secondary | ICD-10-CM | POA: Insufficient documentation

## 2020-10-07 DIAGNOSIS — Z79899 Other long term (current) drug therapy: Secondary | ICD-10-CM | POA: Diagnosis not present

## 2020-10-07 DIAGNOSIS — S0990XA Unspecified injury of head, initial encounter: Secondary | ICD-10-CM | POA: Diagnosis present

## 2020-10-07 DIAGNOSIS — N182 Chronic kidney disease, stage 2 (mild): Secondary | ICD-10-CM | POA: Diagnosis not present

## 2020-10-07 DIAGNOSIS — W01198A Fall on same level from slipping, tripping and stumbling with subsequent striking against other object, initial encounter: Secondary | ICD-10-CM | POA: Diagnosis not present

## 2020-10-07 DIAGNOSIS — R220 Localized swelling, mass and lump, head: Secondary | ICD-10-CM | POA: Diagnosis not present

## 2020-10-07 DIAGNOSIS — I129 Hypertensive chronic kidney disease with stage 1 through stage 4 chronic kidney disease, or unspecified chronic kidney disease: Secondary | ICD-10-CM | POA: Diagnosis not present

## 2020-10-07 HISTORY — DX: Age-related osteoporosis without current pathological fracture: M81.0

## 2020-10-07 MED ORDER — LIDOCAINE-EPINEPHRINE (PF) 2 %-1:200000 IJ SOLN
INTRAMUSCULAR | Status: AC
  Filled 2020-10-07: qty 20

## 2020-10-07 NOTE — Discharge Instructions (Addendum)
Sutures out in 5 to 7 days °

## 2020-10-07 NOTE — ED Notes (Signed)
ED Provider at bedside. 

## 2020-10-07 NOTE — ED Provider Notes (Signed)
[facility] EMERGENCY DEPT Provider Note   CSN: [id number] Arrival date & time: 10/07/20  0847     History Chief Complaint  Patient presents with   Amber Hickman is a 65 y.o. female.  65 year old female who presents after mechanical fall just prior to arrival.  [address] and struck her face.  No loss of consciousness.  Has had quite a few falls recently she says.  Denies any recent illnesses.  No chest pain or shortness of breath prior to the event.  Denies any new neck discomfort.  No focal neurological deficits.  May have lost consciousness had a fall she had last week.      Past Medical History:  Diagnosis Date   Allergy    Arthritis    AVN (avascular necrosis of bone) (HCC)    Left shoulder   COPD (chronic obstructive pulmonary disease) (HCC)    Depression    GERD (gastroesophageal reflux disease)    Hyperlipidemia    Hypertension    Memory disorder 12/23/2016   Osteoporosis    TIA (transient ischemic attack) 09/30/2017   Vitamin D deficiency     Patient Active Problem List   Diagnosis Date Noted   Osteoporosis 07/19/2020   History of colon polyps 07/15/2020   Overweight (BMI 25.0-29.9) 08/31/2019   Abnormal glucose 02/15/2018   Former smoker (30 pack/year, quit 2019) 02/15/2018   Aortic atherosclerosis ([place]) by Chest  CT scan 10/02/2019 09/09/2017   OSA and COPD overlap syndrome ([place]) 10/13/2016   Depression, major, recurrent, in partial remission ([place]) 10/12/2016   Essential hypertension 06/05/2015   DDD, lumbar 05/21/2014   Chronic pain syndrome 05/21/2014   CKD (chronic kidney disease) stage 2, GFR 60-89 ml/min 02/11/2014   Medication management 11/01/2013   COPD (chronic obstructive pulmonary disease) ([place]) 04/27/2013   Hyperlipidemia, mixed    Vitamin D deficiency     Past Surgical History:  Procedure Laterality Date   ANKLE SURGERY Right    BACK SURGERY     BREAST EXCISIONAL BIOPSY Right    CATARACT EXTRACTION  Bilateral    SPINE SURGERY     lumbar   TONSILLECTOMY AND ADENOIDECTOMY       OB History   No obstetric history on file.     Family History  Problem Relation Age of Onset   Hypertension Mother    COPD Mother    Stroke Mother    Cancer Mother        breast   Heart disease Father        Age 54, MI   Breast cancer Neg Hx     Social History   Tobacco Use   Smoking status: Former    Packs/day: 1.00    Years: 30.00    Pack years: 30.00    Types: Cigarettes    Quit date: 05/10/2017    Years since quitting: 3.4   Smokeless tobacco: Never  Vaping Use   Vaping Use: Never used  Substance Use Topics   Alcohol use: No   Drug use: No    Home Medications Prior to Admission medications   Medication Sig Start Date End Date Taking? Authorizing Provider  acetaminophen (TYLENOL) 650 MG CR tablet Take by mouth. Takes 2 tablets daily.    [provider]  alendronate (FOSAMAX) 70 MG tablet Take 1 tablet (70 mg total) by mouth every 7 (seven) days. Take with a full glass of water on an empty stomach. Patient not taking: Reported on  09/23/2020 07/19/20 07/19/21  Liane Comber, NP  baclofen (LIORESAL) 20 MG tablet Take 20 mg by mouth 3 (three) times daily as needed for muscle spasms.    [provider]  cholecalciferol 5000 units TABS Take 2 tablets daily 01/19/17   Unk Pinto, MD  DULoxetine (CYMBALTA) 60 MG capsule Take  1 capsule  Daily  for Chronic Pain 07/07/20   Unk Pinto, MD  ketotifen (ZADITOR) 0.025 % ophthalmic solution Place 1 drop into both eyes 3 (three) times daily as needed (allergies).    [provider]  olmesartan (BENICAR) 20 MG tablet Take one tablet by mouth daily. Patient taking differently: as needed. Take one tablet by mouth daily. 04/01/20   Garnet Sierras, NP  oxyCODONE (ROXICODONE) 15 MG immediate release tablet Take 15 mg by mouth every 6 (six) hours as needed for pain.  07/04/17   [provider]  simvastatin (ZOCOR)  40 MG tablet Take  1 tablet  at Bedtime  for Cholesterol 07/07/20   Unk Pinto, MD    Allergies    Augmentin [amoxicillin-pot clavulanate], Codeine, Zoloft [sertraline hcl], and Lipitor [atorvastatin]  Review of Systems   Review of Systems  All other systems reviewed and are negative.  Physical Exam Updated Vital Signs BP 124/64 (BP Location: Left Arm)   Pulse 63   Temp 98.8 F (37.1 C) (Oral)   Resp 16   Ht 1.626 m ('5\' 4"'$ )   Wt 72.6 kg   SpO2 96%   BMI 27.46 kg/m   Physical Exam Vitals and nursing note reviewed.  Constitutional:      General: She is not in acute distress.    Appearance: Normal appearance. She is well-developed. She is not toxic-appearing.  HENT:     Head:   Eyes:     General: Lids are normal.     Conjunctiva/sclera: Conjunctivae normal.     Pupils: Pupils are equal, round, and reactive to light.  Neck:     Thyroid: No thyroid mass.     Trachea: No tracheal deviation.  Cardiovascular:     Rate and Rhythm: Normal rate and regular rhythm.     Heart sounds: Normal heart sounds. No murmur heard.   No gallop.  Pulmonary:     Effort: Pulmonary effort is normal. No respiratory distress.     Breath sounds: Normal breath sounds. No stridor. No decreased breath sounds, wheezing, rhonchi or rales.  Abdominal:     General: There is no distension.     Palpations: Abdomen is soft.     Tenderness: There is no abdominal tenderness. There is no rebound.  Musculoskeletal:        General: No tenderness. Normal range of motion.     Cervical back: Normal range of motion and neck supple.  Skin:    General: Skin is warm and dry.     Findings: No abrasion or rash.  Neurological:     General: No focal deficit present.     Mental Status: She is alert and oriented to person, place, and time. Mental status is at baseline.     GCS: GCS eye subscore is 4. GCS verbal subscore is 5. GCS motor subscore is 6.     Cranial Nerves: Cranial nerves are intact. No cranial  nerve deficit.     Sensory: No sensory deficit.     Motor: Motor function is intact.  Psychiatric:        Attention and Perception: Attention normal.  Speech: Speech normal.        Behavior: Behavior normal.    ED Results / Procedures / Treatments   Labs (all labs ordered are listed, but only abnormal results are displayed) Labs Reviewed - No data to display  EKG EKG Interpretation  Date/Time:  Tuesday October 07 2020 09:26:08 EDT Ventricular Rate:  62 PR Interval:  165 QRS Duration: 93 QT Interval:  459 QTC Calculation: 467 R Axis:   89 Text Interpretation: Sinus rhythm Borderline right axis deviation Nonspecific T abnrm, anterolateral leads Confirmed by Lacretia Leigh ([id number]) on 10/07/2020 10:48:01 AM  Radiology No results found.  Procedures .Marland KitchenLaceration Repair  Date/Time: 10/07/2020 10:45 AM Performed by: Lacretia Leigh, MD Authorized by: Lacretia Leigh, MD   Consent:    Consent obtained:  Verbal   Consent given by:  Patient   Risks, benefits, and alternatives were discussed: yes     Alternatives discussed:  No treatment Universal protocol:    Immediately prior to procedure, a time out was called: yes   Anesthesia:    Anesthesia method:  Local infiltration   Local anesthetic:  Lidocaine 2% WITH epi Laceration details:    Location:  Lip   Length (cm):  2   Depth (mm):  0.5 Pre-procedure details:    Preparation:  Patient was prepped and draped in usual sterile fashion Exploration:    Imaging outcome: foreign body not noted     Contaminated: no   Treatment:    Area cleansed with:  Saline   Amount of cleaning:  Standard   Irrigation solution:  Sterile saline   Irrigation method:  Syringe   Visualized foreign bodies/material removed: no     Debridement:  None   Undermining:  None   Scar revision: no   Skin repair:    Repair method:  Sutures   Suture size:  6-0   Suture material:  Chromic gut and Prolene   Suture technique:  Simple interrupted    Number of sutures:  5 Approximation:    Approximation:  Close   [place] border well-aligned: yes   Repair type:    Repair type:  Complex Post-procedure details:    Dressing:  Open (no dressing)   Procedure completion:  Tolerated   Medications Ordered in ED Medications  lidocaine-EPINEPHrine (XYLOCAINE W/EPI) 2 %-1:200000 (PF) injection (has no administration in time range)    ED Course  I have reviewed the triage vital signs and the nursing notes.  Pertinent labs & imaging results that were available during my care of the patient were reviewed by me and considered in my medical decision making (see chart for details).    MDM Rules/Calculators/A&P                           CT of head and face negative.  Tetanus status updated.  Will discharge Final Clinical Impression(s) / ED Diagnoses Final diagnoses:  None    Rx / DC Orders ED Discharge Orders     None        Lacretia Leigh, MD 10/07/20 1048

## 2020-10-07 NOTE — ED Triage Notes (Addendum)
Pt arrives POV, c/o fall at home hitting her face, bottom lip and nose. There is a laceration on left bottom lip.  States she does not take blood thinning medications.  Denies LOC with this fall.  She reports multiple falls over the last couple of weeks and is unsure if she lost consciousness with previous falls.

## 2020-10-13 NOTE — Progress Notes (Signed)
Future Appointments  Date Time Provider [place]  10/14/2020 11:30 AM Unk Pinto, MD [facility] None  01/01/2021  2:30 PM Liane Comber, NP [facility] None  04/06/2021  3:30 PM Unk Pinto, MD [facility] None  07/16/2021 11:00 AM Liane Comber, NP [facility] None    History of Present Illness:     Patient is a very nice 65 yo single WF who 1 week ago fell & was seen in the in the ER on 8/23 had repair of a 2 cm laceration of her lip with #5 sutures.  Also apparently had dental fractures of several teeth. She saw her dentist several days ago and was dx'd with a facial cellulitis with swelling of her Left cheek & was prescribed Augmentin & Flagyl.     Medications  Current Outpatient Medications (Endocrine & Metabolic):    alendronate (FOSAMAX) 70 MG tablet, Take 1 tablet (70 mg total) by mouth every 7 (seven) days. Take with a full glass of water on an empty stomach.  Current Outpatient Medications (Cardiovascular):    olmesartan (BENICAR) 20 MG tablet, Take one tablet by mouth daily. (Patient taking differently: as needed. Take one tablet by mouth daily.)   simvastatin (ZOCOR) 40 MG tablet, Take  1 tablet  at Bedtime  for Cholesterol   Current Outpatient Medications (Analgesics):    acetaminophen (TYLENOL) 650 MG CR tablet, Take by mouth. Takes 2 tablets daily.   oxyCODONE (ROXICODONE) 15 MG immediate release tablet, Take 15 mg by mouth every 6 (six) hours as needed for pain.    Current Outpatient Medications (Other):    baclofen (LIORESAL) 20 MG tablet, Take 20 mg by mouth 3 (three) times daily as needed for muscle spasms.   cholecalciferol 5000 units TABS, Take 2 tablets daily   DULoxetine (CYMBALTA) 60 MG capsule, Take  1 capsule  Daily  for Chronic Pain   gabapentin (NEURONTIN) 800 MG tablet, Take 1/2 to 1 tablet 3 x /day as needed for Pain   ketotifen (ZADITOR) 0.025 % ophthalmic solution, Place 1 drop into both eyes 3 (three) times daily as needed  (allergies).  Problem list She has Hyperlipidemia, mixed; Vitamin D deficiency; COPD (chronic obstructive pulmonary disease) ([place]); Medication management; CKD (chronic kidney disease) stage 2, GFR 60-89 ml/min; DDD, lumbar; Chronic pain syndrome; Essential hypertension; Depression, major, recurrent, in partial remission ([place]); OSA and COPD overlap syndrome ([place]); Aortic atherosclerosis ([place]) by Chest  CT scan 10/02/2019; Abnormal glucose; Former smoker (30 pack/year, quit 2019); Overweight (BMI 25.0-29.9); History of colon polyps; and Osteoporosis on their problem list.   Observations/Objective:  BP 112/84   Pulse 89   Temp 97.6 F (36.4 C)   Resp 16   Ht '5\' 4"'$  (1.626 m)   Wt 165 lb 9.6 oz (75.1 kg)   SpO2 97%   BMI 28.43 kg/m   HEENT -  EAC's/TMs - Nl. EOMs full conjugate. Cr n Nl/symmetric. There is tender STS over the Left para nasal cheek. There is a healing wound of the lower mid lip & proline sutures x 4 are removed.  Neck - supple.  Chest - Clear equal BS. Cor - Nl HS. RRR w/o sig MGR. PP 1(+). No edema. MS- FROM w/o deformities.  Gait Nl. Neuro -  Nl w/o focal abnormalities.   Assessment and Plan:   1. Cellulitis, face   2. Lip laceration, subsequent encounter   3. Closed fracture of tooth, sequela   4. Chronic pain syndrome  - Patient is encouraged to try  to taper Oxycodone and replace with   - gabapentin (NEURONTIN) 800 MG tablet; Take 1/2 to 1 tablet 3 x /day as needed for Pain  Dispense: 90 tablet; Refill: 0  - ROV 1 week to recheck wound   Follow Up Instructions:      I discussed the assessment and treatment plan with the patient. The patient was provided an opportunity to ask questions and all were answered. The patient agreed with the plan and demonstrated an understanding of the instructions.       The patient was advised to call back or seek an in-person evaluation if the symptoms worsen or if the condition fails to improve as  anticipated.    Kirtland Bouchard, MD

## 2020-10-14 ENCOUNTER — Ambulatory Visit (INDEPENDENT_AMBULATORY_CARE_PROVIDER_SITE_OTHER): Payer: Medicare Other | Admitting: Internal Medicine

## 2020-10-14 ENCOUNTER — Encounter: Payer: Self-pay | Admitting: Internal Medicine

## 2020-10-14 ENCOUNTER — Other Ambulatory Visit: Payer: Self-pay

## 2020-10-14 VITALS — BP 112/84 | HR 89 | Temp 97.6°F | Resp 16 | Ht 64.0 in | Wt 165.6 lb

## 2020-10-14 DIAGNOSIS — S025XXS Fracture of tooth (traumatic), sequela: Secondary | ICD-10-CM

## 2020-10-14 DIAGNOSIS — G894 Chronic pain syndrome: Secondary | ICD-10-CM | POA: Diagnosis not present

## 2020-10-14 DIAGNOSIS — L03211 Cellulitis of face: Secondary | ICD-10-CM

## 2020-10-14 DIAGNOSIS — S01511D Laceration without foreign body of lip, subsequent encounter: Secondary | ICD-10-CM

## 2020-10-14 MED ORDER — GABAPENTIN 800 MG PO TABS: ORAL_TABLET | ORAL | 0 refills | Status: DC

## 2020-10-17 ENCOUNTER — Telehealth: Payer: Self-pay

## 2020-10-17 NOTE — Telephone Encounter (Signed)
The patient called to inquire if the medications that her dentist had placed her on would be causing diarrhea. The patient was advised to please stay hydrated and to contact her dentist office or on-call service for management of her antibiotic medication. She was advised to report to her local emergency room or call 911 for evaluation and treatment. The patient voiced verbal understanding to this staff member.

## 2020-10-20 NOTE — Progress Notes (Signed)
Future Appointments  Date Time Provider [place]  10/21/2020  9:30 AM Unk Pinto, MD [facility] None  01/01/2021      - 3 mo   2:30 PM Liane Comber, NP [facility] None  04/06/2021     - 6 mo  3:30 PM Unk Pinto, MD [facility] None  07/16/2021     - Wellness 11:00 AM Liane Comber, NP [facility] None    History of Present Illness:     Patient is a very nice 65 yo single WF returning for 1 week f/u of facial cellulitis after a fall 1 week preceding that  ( 2 weeks ago ).  She had been prescribed Flagyl /Augmentin by her Dentist. Also last week she was prescribed higher dose of Gabapentin to allow sparing of her chronic Opiates. Patient reports facial swelling & tenderness resolved , but still has remnant fractured front teeth to see her dentist re: extractions. Also patient reports having liquid stool like "chocolate syrup" since on the Augmentin & Flagyl.   Medications  Current Outpatient Medications (Endocrine & Metabolic):    alendronate (FOSAMAX) 70 MG tablet, Take 1 tablet (70 mg total) by mouth every 7 (seven) days. Take with a full glass of water on an empty stomach.  Current Outpatient Medications (Cardiovascular):    olmesartan (BENICAR) 20 MG tablet, Take one tablet by mouth daily. (Patient taking differently: as needed. Take one tablet by mouth daily.)   simvastatin (ZOCOR) 40 MG tablet, Take  1 tablet  at Bedtime  for Cholesterol   Current Outpatient Medications (Analgesics):    acetaminophen (TYLENOL) 650 MG CR tablet, Take by mouth. Takes 2 tablets daily.   oxyCODONE (ROXICODONE) 15 MG immediate release tablet, Take 15 mg by mouth every 6 (six) hours as needed for pain.    Current Outpatient Medications (Other):    baclofen (LIORESAL) 20 MG tablet, Take 20 mg by mouth 3 (three) times daily as needed for muscle spasms.   cholecalciferol 5000 units TABS, Take 2 tablets daily   DULoxetine (CYMBALTA) 60 MG capsule, Take  1 capsule  Daily  for  Chronic Pain   gabapentin (NEURONTIN) 800 MG tablet, Take 1/2 to 1 tablet 3 x /day as needed for Pain   ketotifen (ZADITOR) 0.025 % ophthalmic solution, Place 1 drop into both eyes 3 (three) times daily as needed (allergies).  Problem list She has Hyperlipidemia, mixed; Vitamin D deficiency; COPD (chronic obstructive pulmonary disease) ([place]); Medication management; CKD (chronic kidney disease) stage 2, GFR 60-89 ml/min; DDD, lumbar; Chronic pain syndrome; Essential hypertension; Depression, major, recurrent, in partial remission ([place]); OSA and COPD overlap syndrome ([place]); Aortic atherosclerosis ([place]) by Chest  CT scan 10/02/2019; Abnormal glucose; Former smoker (30 pack/year, quit 2019); Overweight (BMI 25.0-29.9); History of colon polyps; and Osteoporosis on their problem list.   Observations/Objective:  BP 108/68   Pulse 82   Temp (!) 96.4 F (35.8 C)   Ht '5\' 4"'$  (1.626 m)   Wt 158 lb 6.4 oz (71.8 kg)   SpO2 96%   BMI 27.19 kg/m   HEENT - EACs/TMs - Nl  Facial swelling resolved. O/P with upper gum dental remnants exposed. No apparent sign of infection Neck - supple.  Chest - Clear equal BS. Cor - Nl HS. RRR w/o sig MGR. PP 1(+). No edema. MS- FROM w/o deformities.  Gait Nl. Neuro -  Nl w/o focal abnormalities.  Assessment and Plan:  1. Cellulitis, face   2. Contusion of face, subsequent encounter  3. Diarrhea of infectious origin  - Gastrointestinal Pathogen Panel PCR  Follow Up Instructions:      I discussed the assessment and treatment plan with the patient. The patient was provided an opportunity to ask questions and all were answered. The patient agreed with the plan and demonstrated an understanding of the instructions.       The patient was advised to call back or seek an in-person evaluation if the symptoms worsen or if the condition fails to improve as anticipated.   Kirtland Bouchard, MD

## 2020-10-21 ENCOUNTER — Other Ambulatory Visit: Payer: Self-pay

## 2020-10-21 ENCOUNTER — Ambulatory Visit (INDEPENDENT_AMBULATORY_CARE_PROVIDER_SITE_OTHER): Payer: Medicare Other | Admitting: Internal Medicine

## 2020-10-21 ENCOUNTER — Encounter: Payer: Self-pay | Admitting: Internal Medicine

## 2020-10-21 VITALS — BP 108/68 | HR 82 | Temp 96.4°F | Ht 64.0 in | Wt 158.4 lb

## 2020-10-21 DIAGNOSIS — L03211 Cellulitis of face: Secondary | ICD-10-CM

## 2020-10-21 DIAGNOSIS — A09 Infectious gastroenteritis and colitis, unspecified: Secondary | ICD-10-CM | POA: Diagnosis not present

## 2020-10-21 DIAGNOSIS — S0083XD Contusion of other part of head, subsequent encounter: Secondary | ICD-10-CM

## 2020-10-21 DIAGNOSIS — K529 Noninfective gastroenteritis and colitis, unspecified: Secondary | ICD-10-CM

## 2020-10-23 DIAGNOSIS — M4326 Fusion of spine, lumbar region: Secondary | ICD-10-CM | POA: Diagnosis not present

## 2020-10-23 DIAGNOSIS — M47816 Spondylosis without myelopathy or radiculopathy, lumbar region: Secondary | ICD-10-CM | POA: Diagnosis not present

## 2020-10-23 DIAGNOSIS — G894 Chronic pain syndrome: Secondary | ICD-10-CM | POA: Diagnosis not present

## 2020-10-24 LAB — GASTROINTESTINAL PATHOGEN PANEL PCR
C. difficile Tox A/B, PCR: NOT DETECTED
Campylobacter, PCR: NOT DETECTED
Cryptosporidium, PCR: NOT DETECTED
E coli (ETEC) LT/ST PCR: NOT DETECTED
E coli (STEC) stx1/stx2, PCR: NOT DETECTED
E coli 0157, PCR: NOT DETECTED
Giardia lamblia, PCR: NOT DETECTED
Norovirus, PCR: NOT DETECTED
Rotavirus A, PCR: NOT DETECTED
Salmonella, PCR: NOT DETECTED
Shigella, PCR: NOT DETECTED

## 2020-10-24 NOTE — Progress Notes (Addendum)
============================================================ ============================================================  -    GI pathogen panel returned NEGATIVE for Bacterial, Viral or Parasite Infection.  So the diarrhea was most likely a side effect of the Amoxicillin in the Augmentin.

## 2020-11-14 ENCOUNTER — Emergency Department (HOSPITAL_COMMUNITY)
Admission: EM | Admit: 2020-11-14 | Discharge: 2020-11-14 | Disposition: A | Discharge status: Home / Self Care | Payer: Medicare Other | Attending: Emergency Medicine | Admitting: Emergency Medicine

## 2020-11-14 ENCOUNTER — Emergency Department (HOSPITAL_COMMUNITY): Payer: Medicare Other

## 2020-11-14 ENCOUNTER — Other Ambulatory Visit: Payer: Self-pay

## 2020-11-14 DIAGNOSIS — M25561 Pain in right knee: Secondary | ICD-10-CM | POA: Diagnosis not present

## 2020-11-14 DIAGNOSIS — R9431 Abnormal electrocardiogram [ECG] [EKG]: Secondary | ICD-10-CM | POA: Diagnosis not present

## 2020-11-14 DIAGNOSIS — I129 Hypertensive chronic kidney disease with stage 1 through stage 4 chronic kidney disease, or unspecified chronic kidney disease: Secondary | ICD-10-CM | POA: Insufficient documentation

## 2020-11-14 DIAGNOSIS — N182 Chronic kidney disease, stage 2 (mild): Secondary | ICD-10-CM | POA: Diagnosis not present

## 2020-11-14 DIAGNOSIS — I2510 Atherosclerotic heart disease of native coronary artery without angina pectoris: Secondary | ICD-10-CM | POA: Insufficient documentation

## 2020-11-14 DIAGNOSIS — M87059 Idiopathic aseptic necrosis of unspecified femur: Secondary | ICD-10-CM | POA: Diagnosis not present

## 2020-11-14 DIAGNOSIS — Z87891 Personal history of nicotine dependence: Secondary | ICD-10-CM | POA: Diagnosis not present

## 2020-11-14 DIAGNOSIS — I10 Essential (primary) hypertension: Secondary | ICD-10-CM | POA: Diagnosis not present

## 2020-11-14 DIAGNOSIS — M87051 Idiopathic aseptic necrosis of right femur: Secondary | ICD-10-CM

## 2020-11-14 DIAGNOSIS — W010XXA Fall on same level from slipping, tripping and stumbling without subsequent striking against object, initial encounter: Secondary | ICD-10-CM | POA: Insufficient documentation

## 2020-11-14 DIAGNOSIS — R519 Headache, unspecified: Secondary | ICD-10-CM | POA: Insufficient documentation

## 2020-11-14 DIAGNOSIS — M89252 Other disorders of bone development and growth, left femur: Secondary | ICD-10-CM | POA: Diagnosis not present

## 2020-11-14 DIAGNOSIS — M879 Osteonecrosis, unspecified: Secondary | ICD-10-CM | POA: Diagnosis not present

## 2020-11-14 DIAGNOSIS — M25551 Pain in right hip: Secondary | ICD-10-CM | POA: Diagnosis not present

## 2020-11-14 DIAGNOSIS — M6281 Muscle weakness (generalized): Secondary | ICD-10-CM | POA: Insufficient documentation

## 2020-11-14 DIAGNOSIS — R531 Weakness: Secondary | ICD-10-CM | POA: Diagnosis not present

## 2020-11-14 DIAGNOSIS — W19XXXA Unspecified fall, initial encounter: Secondary | ICD-10-CM

## 2020-11-14 DIAGNOSIS — R296 Repeated falls: Secondary | ICD-10-CM | POA: Diagnosis not present

## 2020-11-14 DIAGNOSIS — J449 Chronic obstructive pulmonary disease, unspecified: Secondary | ICD-10-CM | POA: Insufficient documentation

## 2020-11-14 LAB — COMPREHENSIVE METABOLIC PANEL
ALT: 28 U/L (ref 0–44)
AST: 28 U/L (ref 15–41)
Albumin: 3.6 g/dL (ref 3.5–5.0)
Alkaline Phosphatase: 80 U/L (ref 38–126)
Anion gap: 7 (ref 5–15)
BUN: 11 mg/dL (ref 8–23)
CO2: 27 mmol/L (ref 22–32)
Calcium: 9.7 mg/dL (ref 8.9–10.3)
Chloride: 108 mmol/L (ref 98–111)
Creatinine, Ser: 0.96 mg/dL (ref 0.44–1.00)
GFR, Estimated: >60 mL/min (ref >60)
Glucose, Bld: 102 mg/dL — ABNORMAL HIGH (ref 70–99)
Potassium: 4.1 mmol/L (ref 3.5–5.1)
Sodium: 142 mmol/L (ref 135–145)
Total Bilirubin: 0.7 mg/dL (ref 0.3–1.2)
Total Protein: 6.8 g/dL (ref 6.5–8.1)

## 2020-11-14 LAB — CBC WITH DIFFERENTIAL/PLATELET
Abs Immature Granulocytes: 0.02 10*3/uL (ref 0.00–0.07)
Basophils Absolute: 0 10*3/uL (ref 0.0–0.1)
Basophils Relative: 1 %
Eosinophils Absolute: 0.3 10*3/uL (ref 0.0–0.5)
Eosinophils Relative: 5 %
HCT: 40.2 % (ref 36.0–46.0)
Hemoglobin: 12.6 g/dL (ref 12.0–15.0)
Immature Granulocytes: 0 %
Lymphocytes Relative: 30 %
Lymphs Abs: 1.9 10*3/uL (ref 0.7–4.0)
MCH: 30.3 pg (ref 26.0–34.0)
MCHC: 31.3 g/dL (ref 30.0–36.0)
MCV: 96.6 fL (ref 80.0–100.0)
Monocytes Absolute: 0.4 10*3/uL (ref 0.1–1.0)
Monocytes Relative: 7 %
Neutro Abs: 3.6 10*3/uL (ref 1.7–7.7)
Neutrophils Relative %: 57 %
Platelets: 291 10*3/uL (ref 150–400)
RBC: 4.16 MIL/uL (ref 3.87–5.11)
RDW: 12.8 % (ref 11.5–15.5)
WBC: 6.4 10*3/uL (ref 4.0–10.5)
nRBC: 0 % (ref 0.0–0.2)

## 2020-11-14 NOTE — ED Provider Notes (Signed)
[facility] EMERGENCY DEPARTMENT Provider Note   CSN: [id number] Arrival date & time: 11/14/20  1003     History Chief Complaint  Patient presents with   Amber Hickman is a 65 y.o. female.  65 year old female with history of HTN, hyperlipidemia, COPD, CKD 2, presents with complaint of several falls.   Fall in the kitchen last night, felt like her legs gave out, was harder than usual to get back up afterwards. Arms and legs were shaking. Later in the evening was playing a game on her ipad and her fingers on her right hand were tremuous (unsure about left as she was holding the ipad), today with tremor in both hands when using her phone. Today, felt legs give out again and fell. Patient called a neighbor who called EMS who brought her to the ER.  Not on thinners, did not hit head, no reported LOC.  Slight posterior, 2/10 headache last night that resolved. Today has a mild frontal headache. No recent illness. Prior fusion from L3-S1, reports different than normal back pain for the past few weeks. Call to her provider who was going to change pain medications and consider further fusion. Reports baseline right leg weaker than left.  Denies groin numbness, abdominal pain, loss of bowel or bladder control, fever.      Past Medical History:  Diagnosis Date   Allergy    Arthritis    AVN (avascular necrosis of bone) (HCC)    Left shoulder   COPD (chronic obstructive pulmonary disease) (HCC)    Depression    GERD (gastroesophageal reflux disease)    Hyperlipidemia    Hypertension    Memory disorder 12/23/2016   Osteoporosis    TIA (transient ischemic attack) 09/30/2017   Vitamin D deficiency     Patient Active Problem List   Diagnosis Date Noted   Osteoporosis 07/19/2020   History of colon polyps 07/15/2020   Overweight (BMI 25.0-29.9) 08/31/2019   Abnormal glucose 02/15/2018   Former smoker (30 pack/year, quit 2019) 02/15/2018   Aortic  atherosclerosis ([place]) by Chest  CT scan 10/02/2019 09/09/2017   OSA and COPD overlap syndrome ([place]) 10/13/2016   Depression, major, recurrent, in partial remission ([place]) 10/12/2016   Essential hypertension 06/05/2015   DDD, lumbar 05/21/2014   Chronic pain syndrome 05/21/2014   CKD (chronic kidney disease) stage 2, GFR 60-89 ml/min 02/11/2014   Medication management 11/01/2013   COPD (chronic obstructive pulmonary disease) ([place]) 04/27/2013   Hyperlipidemia, mixed    Vitamin D deficiency     Past Surgical History:  Procedure Laterality Date   ANKLE SURGERY Right    BACK SURGERY     BREAST EXCISIONAL BIOPSY Right    CATARACT EXTRACTION Bilateral    SPINE SURGERY     lumbar   TONSILLECTOMY AND ADENOIDECTOMY       OB History   No obstetric history on file.     Family History  Problem Relation Age of Onset   Hypertension Mother    COPD Mother    Stroke Mother    Cancer Mother        breast   Heart disease Father        Age 72, MI   Breast cancer Neg Hx     Social History   Tobacco Use   Smoking status: Former    Packs/day: 1.00    Years: 30.00    Pack years: 30.00    Types: Cigarettes  Quit date: 05/10/2017    Years since quitting: 3.5   Smokeless tobacco: Never  Vaping Use   Vaping Use: Never used  Substance Use Topics   Alcohol use: No   Drug use: No    Home Medications Prior to Admission medications   Medication Sig Start Date End Date Taking? Authorizing Provider  acetaminophen (TYLENOL) 650 MG CR tablet Take by mouth. Takes 2 tablets daily.    [provider]  alendronate (FOSAMAX) 70 MG tablet Take 1 tablet (70 mg total) by mouth every 7 (seven) days. Take with a full glass of water on an empty stomach. 07/19/20 07/19/21  Liane Comber, NP  baclofen (LIORESAL) 20 MG tablet Take 20 mg by mouth 3 (three) times daily as needed for muscle spasms.    [provider]  cholecalciferol 5000 units TABS Take 2 tablets daily 01/19/17   Unk Pinto, MD  DULoxetine (CYMBALTA) 60 MG capsule Take  1 capsule  Daily  for Chronic Pain 07/07/20   Unk Pinto, MD  gabapentin (NEURONTIN) 800 MG tablet Take 1/2 to 1 tablet 3 x /day as needed for Pain 10/14/20   Unk Pinto, MD  ketotifen (ZADITOR) 0.025 % ophthalmic solution Place 1 drop into both eyes 3 (three) times daily as needed (allergies).    [provider]  olmesartan (BENICAR) 20 MG tablet Take one tablet by mouth daily. Patient taking differently: as needed. Take one tablet by mouth daily. 04/01/20   Garnet Sierras, NP  oxyCODONE (ROXICODONE) 15 MG immediate release tablet Take 15 mg by mouth every 6 (six) hours as needed for pain.  07/04/17   [provider]  simvastatin (ZOCOR) 40 MG tablet Take  1 tablet  at Bedtime  for Cholesterol 07/07/20   Unk Pinto, MD    Allergies    Augmentin [amoxicillin-pot clavulanate], Codeine, Zoloft [sertraline hcl], and Lipitor [atorvastatin]  Review of Systems   Review of Systems  Constitutional:  Negative for chills and fever.  Respiratory:  Negative for shortness of breath.   Cardiovascular:  Negative for chest pain.  Gastrointestinal:  Negative for abdominal pain, nausea and vomiting.  Genitourinary:  Negative for decreased urine volume and difficulty urinating.  Musculoskeletal:  Positive for arthralgias, gait problem and myalgias. Negative for back pain.  Skin:  Negative for rash and wound.  Neurological:  Positive for weakness. Negative for numbness.  Hematological:  Does not bruise/bleed easily.  Psychiatric/Behavioral:  Negative for confusion.   All other systems reviewed and are negative.  Physical Exam Updated Vital Signs BP (!) 141/84 (BP Location: Left Arm)   Pulse 60   Temp 99 F (37.2 C) (Oral)   Resp 16   SpO2 96%   Physical Exam Vitals and nursing note reviewed.  Constitutional:      General: She is not in acute distress.    Appearance: She is well-developed. She is not  diaphoretic.  HENT:     Head: Normocephalic and atraumatic.  Cardiovascular:     Rate and Rhythm: Normal rate and regular rhythm.     Heart sounds: Normal heart sounds.  Pulmonary:     Effort: Pulmonary effort is normal.     Breath sounds: Normal breath sounds.  Abdominal:     Palpations: Abdomen is soft.     Tenderness: There is no abdominal tenderness.  Musculoskeletal:        General: No swelling, tenderness or deformity.     Comments: Able to logroll both hips and flex the knees.  Leg strength symmetric, reflexes symmetric, sensation intact, DP pulses present. Upper extremity strength symmetric, radial pulses present.  Skin:    General: Skin is warm and dry.     Findings: No erythema or rash.  Neurological:     Mental Status: She is alert and oriented to person, place, and time.     Sensory: No sensory deficit.     Motor: No weakness.  Psychiatric:        Behavior: Behavior normal.    ED Results / Procedures / Treatments   Labs (all labs ordered are listed, but only abnormal results are displayed) Labs Reviewed  COMPREHENSIVE METABOLIC PANEL - Abnormal; Notable for the following components:      Result Value   Glucose, Bld 102 (*)    All other components within normal limits  CBC WITH DIFFERENTIAL/PLATELET    EKG EKG Interpretation  Date/Time:  Friday November 14 2020 10:07:57 EDT Ventricular Rate:  60 PR Interval:  146 QRS Duration: 86 QT Interval:  468 QTC Calculation: 468 R Axis:   62 Text Interpretation: Normal sinus rhythm Possible Lateral infarct , age undetermined Abnormal ECG old anterior t wave inversions. no sig change Confirmed by Charlesetta Shanks [phone]) on 11/14/2020 10:12:06 AM  Radiology DG Knee Complete 4 Views Right  Result Date: 11/14/2020 CLINICAL DATA:  Multiple falls, weakness and pain. EXAM: RIGHT KNEE - COMPLETE 4+ VIEW COMPARISON:  None FINDINGS: No sign of fracture or dislocation. Bone infarcts in the distal femur in the proximal  tibia. No joint effusion. IMPRESSION: No acute findings. Bone infarcts in the distal femur and proximal tibia. Electronically Signed   By: Zetta Bills M.D.   On: 11/14/2020 14:55   DG Hip Unilat With Pelvis 2-3 Views Right  Result Date: 11/14/2020 CLINICAL DATA:  Fall with right hip pain EXAM: DG HIP (WITH OR WITHOUT PELVIS) 2-3V RIGHT COMPARISON:  09/15/2016. FINDINGS: AP view of the pelvis and AP/frog leg views of the right hip. Lower lumbar spine fixation. Sacroiliac joints are symmetric. Femoral heads are located. Vascular calcifications. Again identified is subtle sclerosis involving both femoral heads, without collapse. No acute fracture. IMPRESSION: No acute osseous abnormality. Subtle bilateral femoral head avascular necrosis. Electronically Signed   By: Abigail Miyamoto M.D.   On: 11/14/2020 14:54    Procedures Procedures   Medications Ordered in ED Medications - No data to display  ED Course  I have reviewed the triage vital signs and the nursing notes.  Pertinent labs & imaging results that were available during my care of the patient were reviewed by me and considered in my medical decision making (see chart for details).  Clinical Course as of 11/14/20 1504  Fri Nov 15, 4762  3284 65 year old female presents with complaint of tremor in her legs with weakness in the legs causing her to fall several times yesterday. On exam, extremity strength symmetric, reflexes symmetric, no unilateral weakness or numbness.  Patient is able to ambulate.  While ambulating with assistance, she did experience 1 episode of her right knee giving out on her, did not result in a fall. Patient does have a walker at home, feels as if she could be discharged home to use her walker to follow-up. X-ray of right hip and right knee obtained on the emergency room.  X-ray of right hip shows known AVN.  X-ray right knee with bone infarct.  Referred to orthopedics for follow-up, encouraged her to follow-up with her  spine surgeon.  She is scheduled to see  her spine surgeon in 1 week. Labs unremarkable including CBC and CMP. [LM]    Clinical Course User Index [LM] Roque Lias   MDM Rules/Calculators/A&P                           Final Clinical Impression(s) / ED Diagnoses Final diagnoses:  Fall, initial encounter  Avascular necrosis of femur, unspecified laterality ([place])  Bone infarct of distal femur, right [facility])    Rx / DC Orders ED Discharge Orders     None        Tacy Learn, PA-C 11/14/20 1504    Godfrey Pick, MD 11/15/20 2239

## 2020-11-14 NOTE — ED Notes (Signed)
Pt is calling her uncle to come pick her up

## 2020-11-14 NOTE — ED Provider Notes (Signed)
Emergency Medicine Provider Triage Evaluation Note  Amber Hickman , a 65 y.o. female  was evaluated in triage.  Pt complains of generalized weakness/essential tremors to legs and arms that she noticed yesterday around 8:30 PM. Pt reports she was walking in her house when her legs began shaking and they gave out on her causing her to fall. She has fall 3x due to this. She always lands on her bottom. Denies head injury or LOC. IS not anticoagulated. States she began taking new BP meds last month. States when she is sitting she does not have the tremors. No pain of any kinda.   Review of Systems  Positive: + weakness, tremors Negative: - fevers, chest pain, SOB, vomiting  Physical Exam  BP (!) 141/84 (BP Location: Left Arm)   Pulse 60   Temp 99 F (37.2 C) (Oral)   Resp 16   SpO2 96%  Gen:   Awake, no distress   Resp:  Normal effort  MSK:   Moves extremities without difficulty  Other:  Neuro intact. No tremor appreciated. Normal finger to nose. Equal strength to BUE and BLEs. Sensation intact throughout.   Medical Decision Making  Medically screening exam initiated at 10:29 AM.  Appropriate orders placed.  [name] was informed that the remainder of the evaluation will be completed by another provider, this initial triage assessment does not replace that evaluation, and the importance of remaining in the ED until their evaluation is complete.     [name], PA-C 11/14/20 1032    Charlesetta Shanks, MD 11/14/20 1409

## 2020-11-14 NOTE — ED Triage Notes (Signed)
Pt here via EMS with c/o on new onset of tremors in extremities and legs gave out. Fall. Denies any injuries. No blood thinners. No LOC   178/100 HR 62 RR 16 CBG 118 98% Ra

## 2020-11-14 NOTE — Discharge Instructions (Addendum)
Chronic changes seen on your x-rays.  Follow-up with your spine doctor.  Also given referral for general orthopedics if you do not have an orthopedic specialist to follow-up with. Recommend use your walker. Return to the emergency room for worsening or concerning symptoms.

## 2020-11-24 DIAGNOSIS — G894 Chronic pain syndrome: Secondary | ICD-10-CM | POA: Diagnosis not present

## 2020-11-24 DIAGNOSIS — M47816 Spondylosis without myelopathy or radiculopathy, lumbar region: Secondary | ICD-10-CM | POA: Diagnosis not present

## 2020-11-24 DIAGNOSIS — M4326 Fusion of spine, lumbar region: Secondary | ICD-10-CM | POA: Diagnosis not present

## 2020-11-24 DIAGNOSIS — M549 Dorsalgia, unspecified: Secondary | ICD-10-CM | POA: Diagnosis not present

## 2020-12-29 DIAGNOSIS — G894 Chronic pain syndrome: Secondary | ICD-10-CM | POA: Diagnosis not present

## 2020-12-29 DIAGNOSIS — M47816 Spondylosis without myelopathy or radiculopathy, lumbar region: Secondary | ICD-10-CM | POA: Diagnosis not present

## 2020-12-29 DIAGNOSIS — M4326 Fusion of spine, lumbar region: Secondary | ICD-10-CM | POA: Diagnosis not present

## 2020-12-31 NOTE — Progress Notes (Signed)
3 FOLLOW UP Assessment:   Essential hypertension - continue medications, DASH diet, exercise and monitor at home. Call if greater than 130/80.  -     CBC with Differential/Platelet -     CMP/GFR -     TSH  Tortuous aorta (HCC) Atherosclerosis of aorta (HCC) Control blood pressure, lipids and glucose Disscused lifestyle modifications, diet & exercise Continue to monitor  Chronic obstructive pulmonary disease, unspecified COPD type ([name]) Per imaging, no longer smoking; denies sx, No medications at this time   OSA and COPD overlap syndrome (HCC) Has CPAP - Discussed using regularly Weight loss encouraged  CKD (chronic kidney disease) stage 2, GFR 60-89 ml/min Increase fluids, avoid NSAIDS, monitor sugars, will monitor -     CMP/GFR  Depression, major, recurrent, in partial remission ([name]) Doing well on cymbalta for mood and pain benefit Discussed stress management techniques  Discussed, increase water,intake & good sleep hygiene  Discussed increasing exercise & vegetables in diet  Chronic pain syndrome DDD, lumbar Continue ortho/pain management follow up, Dr. Patrice Paradise  Former Tobacco use disorder Still not smoking Encouraged healthy behaviors - low dose CT lung cancer screening due in August 2022 - order placed  Other abnormal glucose (prediabetes) Discussed disease and risks Discussed diet/exercise, weight management  Check A1C q50m; CMP for glucose otherwise  Mixed hyperlipidemia Continue medications: Simvastatin 40mg  nightly Discussed dietary and exercise modifications Low fat diet  Vitamin D deficiency Continue supplementation Taking Vitamin D 5,000 IU daily At goal last check - defer today   Medication management Continued  History of TIA Continue follow up with Dr. Jannifer Franklin  Overweight - BMI 28 Discussed dietary and exercise modifications   Over 30 minutes of face to face interview, exam, counseling, chart review, and critical decision making was  performed  Future Appointments  Date Time Provider [place]  04/06/2021  3:30 PM Unk Pinto, MD [facility] None  07/16/2021 11:00 AM Liane Comber, NP [facility] None    Subjective:  Amber Hickman is a 65 y.o. female who presents for 3 month follow up for HTN, HLD, prediabetes, and vitamin D Def.   She had fall and broke 2 front teeth; in process of having other few removed to get implants. Just had follow up today.   She has OSA and is not on CPAP due to intolerance, sleeps well when weight is down, declined referral back to sleep medicine.   She is a former smoker, quit in 2019 with 30 pack year hx.  Low dose CT lung screening 10/02/2019 was benign but showed emphysematous changes and aortic atherosclerosis.   Fused L3 down. Follows with neurosurgeon Dr Clovis Pu. She sees pain management for her back pain, Dr. Marnee Guarneri, gets injections.  They are prescribing her pain medications (oxycodone 15 mg q6h PRN), takes 1-4/day. Has residual RLE weakness/foot drop and calf wasting. Has done some PT but limited benefit.   BMI is Body mass index is 27.91 kg/m., she is working on diet, exercise limited by back pain.  Wt Readings from Last 3 Encounters:  01/01/21 162 lb 9.6 oz (73.8 kg)  10/21/20 158 lb 6.4 oz (71.8 kg)  10/14/20 165 lb 9.6 oz (75.1 kg)   She had a normal echo 04/2015, normal stress test 04/2015.  Aortic atherosclerosis per CT 09/2019.   Her blood pressure has been labile, ranging 110s/60s to 150/90s, today their BP is BP: 118/72  She does not workout. She denies chest pain, shortness of breath, dizziness.   She is on  cholesterol medication (simvastatin 40 mg daily) and denies myalgias. Her cholesterol is at goal. The cholesterol last visit was:   Lab Results  Component Value Date   CHOL 163 09/23/2020   HDL 65 09/23/2020   LDLCALC 80 09/23/2020   TRIG 94 09/23/2020   CHOLHDL 2.5 09/23/2020    She has been working on diet and exercise for  prediabetes, and denies increased appetite, nausea, paresthesia of the feet, polydipsia, polyuria and visual disturbances. Last A1C in the office was:  Lab Results  Component Value Date   HGBA1C 6.0 (H) 09/23/2020   Patient is on Vitamin D supplement.   Lab Results  Component Value Date   VD25OH 50 09/23/2020       Medication Review: Current Outpatient Medications on File Prior to Visit  Medication Sig Dispense Refill   acetaminophen (TYLENOL) 650 MG CR tablet Take by mouth. Takes 2 tablets daily.     baclofen (LIORESAL) 20 MG tablet Take 20 mg by mouth 3 (three) times daily as needed for muscle spasms.     cholecalciferol 5000 units TABS Take 2 tablets daily     DULoxetine (CYMBALTA) 60 MG capsule Take  1 capsule  Daily  for Chronic Pain 90 capsule 3   gabapentin (NEURONTIN) 800 MG tablet Take 1/2 to 1 tablet 3 x /day as needed for Pain 90 tablet 0   ketotifen (ZADITOR) 0.025 % ophthalmic solution Place 1 drop into both eyes 3 (three) times daily as needed (allergies).     olmesartan (BENICAR) 20 MG tablet Take one tablet by mouth daily. (Patient taking differently: as needed. Take one tablet by mouth daily.) 30 tablet 11   oxyCODONE (ROXICODONE) 15 MG immediate release tablet Take 15 mg by mouth every 6 (six) hours as needed for pain.   0   simvastatin (ZOCOR) 40 MG tablet Take  1 tablet  at Bedtime  for Cholesterol 90 tablet 3   alendronate (FOSAMAX) 70 MG tablet Take 1 tablet (70 mg total) by mouth every 7 (seven) days. Take with a full glass of water on an empty stomach. (Patient not taking: Reported on 01/01/2021) 12 tablet 3   No current facility-administered medications on file prior to visit.    Allergies: Allergies  Allergen Reactions   Augmentin [Amoxicillin-Pot Clavulanate] Nausea And Vomiting   Codeine Nausea And Vomiting   Zoloft [Sertraline Hcl]     Pt does not recall reaction    Lipitor [Atorvastatin] Other (See Comments)    Leg pain    Current Problems  (verified) has Hyperlipidemia, mixed; Vitamin D deficiency; COPD (chronic obstructive pulmonary disease) ([name]); Medication management; CKD (chronic kidney disease) stage 2, GFR 60-89 ml/min; DDD, lumbar; Chronic pain syndrome; Essential hypertension; Depression, major, recurrent, in partial remission ([place]); OSA and COPD overlap syndrome ([place]); Aortic atherosclerosis ([place]) by Chest  CT scan 10/02/2019; Abnormal glucose; Former smoker (30 pack/year, quit 2019); Overweight (BMI 25.0-29.9); History of colon polyps; and Osteoporosis on their problem list.  Surgical: She  has a past surgical history that includes Cataract extraction (Bilateral); Spine surgery; Ankle surgery (Right); Tonsillectomy and adenoidectomy; Back surgery; and Breast excisional biopsy (Right). Family Her family history includes COPD in her mother; Cancer in her mother; Heart disease in her father; Hypertension in her mother; Stroke in her mother. Social history  She reports that she quit smoking about 3 years ago. Her smoking use included cigarettes. She has a 30.00 pack-year smoking history. She has never used smokeless tobacco. She reports that she does  not drink alcohol and does not use drugs.   Review of Systems  Constitutional:  Negative for malaise/fatigue and weight loss.  HENT:  Negative for hearing loss and tinnitus.   Eyes:  Negative for blurred vision and double vision.  Respiratory:  Negative for cough, sputum production, shortness of breath and wheezing.   Cardiovascular:  Negative for chest pain, palpitations, orthopnea, claudication, leg swelling and PND.  Gastrointestinal:  Negative for abdominal pain, blood in stool, constipation, diarrhea, heartburn, melena, nausea and vomiting.  Genitourinary: Negative.   Musculoskeletal:  Positive for back pain and joint pain (left shoulder). Negative for falls and myalgias.  Skin:  Negative for rash.  Neurological:  Negative for dizziness, tingling, sensory change, weakness  and headaches.  Endo/Heme/Allergies:  Negative for polydipsia.  Psychiatric/Behavioral: Negative.  Negative for depression, memory loss, substance abuse and suicidal ideas. The patient is not nervous/anxious and does not have insomnia.   All other systems reviewed and are negative.   Objective:   Today's Vitals   01/01/21 1442  BP: 118/72  Pulse: (!) 53  Temp: 97.7 F (36.5 C)  SpO2: 99%  Weight: 162 lb 9.6 oz (73.8 kg)    Body mass index is 27.91 kg/m.  General appearance: alert, no distress, WD/WN, female HEENT: normocephalic, sclerae anicteric, TMs pearly, nares patent, no discharge or erythema, pharynx normal Oral cavity: MMM, no lesions. Most upper front teeth absent. No gingival enlargement.  Neck: supple, no lymphadenopathy, no thyromegaly, no masses Heart: RRR, normal S1, S2, no murmurs Lungs: CTA bilaterally, no wheezes, rhonchi, or rales Abdomen: +bs, soft, non tender, non distended, no masses, no hepatomegaly, no splenomegaly Musculoskeletal: nontender, no swelling, does have obvious atrophy to calf.  Tenderness to bilateral lumbar, no CVA tenderness noted.  Extremities: no edema, no cyanosis, no clubbing, right leg with some atrophy and decrease muscle compared to left.  Pulses: 2+ symmetric, upper extremities, intact pulses.  Neurological: alert, oriented x 3, CN2-12 intact, strength normal upper extremities Weakness with ankle extension R, sensation diminished R foot/lower leg, no cerebellar signs, gait slow steady Psychiatric: normal affect, behavior normal, pleasant    Izora Ribas, NP   01/01/2021

## 2021-01-01 ENCOUNTER — Encounter: Payer: Self-pay | Admitting: Adult Health

## 2021-01-01 ENCOUNTER — Other Ambulatory Visit: Payer: Self-pay

## 2021-01-01 ENCOUNTER — Ambulatory Visit (INDEPENDENT_AMBULATORY_CARE_PROVIDER_SITE_OTHER): Payer: Medicare Other | Admitting: Adult Health

## 2021-01-01 VITALS — BP 118/72 | HR 53 | Temp 97.7°F | Wt 162.6 lb

## 2021-01-01 DIAGNOSIS — I10 Essential (primary) hypertension: Secondary | ICD-10-CM

## 2021-01-01 DIAGNOSIS — J449 Chronic obstructive pulmonary disease, unspecified: Secondary | ICD-10-CM | POA: Diagnosis not present

## 2021-01-01 DIAGNOSIS — Z87891 Personal history of nicotine dependence: Secondary | ICD-10-CM

## 2021-01-01 DIAGNOSIS — G4733 Obstructive sleep apnea (adult) (pediatric): Secondary | ICD-10-CM

## 2021-01-01 DIAGNOSIS — E782 Mixed hyperlipidemia: Secondary | ICD-10-CM | POA: Diagnosis not present

## 2021-01-01 DIAGNOSIS — Z79899 Other long term (current) drug therapy: Secondary | ICD-10-CM | POA: Diagnosis not present

## 2021-01-01 DIAGNOSIS — I700 Atherosclerosis of aorta: Secondary | ICD-10-CM

## 2021-01-01 DIAGNOSIS — E559 Vitamin D deficiency, unspecified: Secondary | ICD-10-CM

## 2021-01-01 DIAGNOSIS — R7309 Other abnormal glucose: Secondary | ICD-10-CM | POA: Diagnosis not present

## 2021-01-01 DIAGNOSIS — E663 Overweight: Secondary | ICD-10-CM

## 2021-01-01 DIAGNOSIS — Z122 Encounter for screening for malignant neoplasm of respiratory organs: Secondary | ICD-10-CM

## 2021-01-02 LAB — CBC WITH DIFFERENTIAL/PLATELET
Absolute Monocytes: 668 cells/uL (ref 200–950)
Basophils Absolute: 27 cells/uL (ref 0–200)
Basophils Relative: 0.3 %
Eosinophils Absolute: 294 cells/uL (ref 15–500)
Eosinophils Relative: 3.3 %
HCT: 38.9 % (ref 35.0–45.0)
Hemoglobin: 12.8 g/dL (ref 11.7–15.5)
Lymphs Abs: 3124 cells/uL (ref 850–3900)
MCH: 30.7 pg (ref 27.0–33.0)
MCHC: 32.9 g/dL (ref 32.0–36.0)
MCV: 93.3 fL (ref 80.0–100.0)
MPV: 11.1 fL (ref 7.5–12.5)
Monocytes Relative: 7.5 %
Neutro Abs: 4788 cells/uL (ref 1500–7800)
Neutrophils Relative %: 53.8 %
Platelets: 274 10*3/uL (ref 140–400)
RBC: 4.17 10*6/uL (ref 3.80–5.10)
RDW: 12.1 % (ref 11.0–15.0)
Total Lymphocyte: 35.1 %
WBC: 8.9 10*3/uL (ref 3.8–10.8)

## 2021-01-02 LAB — COMPLETE METABOLIC PANEL WITH GFR
AG Ratio: 1.6 (calc) (ref 1.0–2.5)
ALT: 32 U/L — ABNORMAL HIGH (ref 6–29)
AST: 28 U/L (ref 10–35)
Albumin: 4.3 g/dL (ref 3.6–5.1)
Alkaline phosphatase (APISO): 85 U/L (ref 37–153)
BUN: 15 mg/dL (ref 7–25)
CO2: 31 mmol/L (ref 20–32)
Calcium: 10.3 mg/dL (ref 8.6–10.4)
Chloride: 102 mmol/L (ref 98–110)
Creat: 0.8 mg/dL (ref 0.50–1.05)
Globulin: 2.7 g/dL (calc) (ref 1.9–3.7)
Glucose, Bld: 83 mg/dL (ref 65–99)
Potassium: 5 mmol/L (ref 3.5–5.3)
Sodium: 141 mmol/L (ref 135–146)
Total Bilirubin: 0.5 mg/dL (ref 0.2–1.2)
Total Protein: 7 g/dL (ref 6.1–8.1)
eGFR: 82 mL/min/{1.73_m2} (ref >=60)

## 2021-01-02 LAB — LIPID PANEL
Cholesterol: 156 mg/dL (ref <200)
HDL: 60 mg/dL (ref >=50)
LDL Cholesterol (Calc): 71 mg/dL (calc)
Non-HDL Cholesterol (Calc): 96 mg/dL (calc) (ref <130)
Total CHOL/HDL Ratio: 2.6 (calc) (ref <5.0)
Triglycerides: 171 mg/dL — ABNORMAL HIGH (ref <150)

## 2021-01-02 LAB — TSH: TSH: 1.01 mIU/L (ref 0.40–4.50)

## 2021-01-02 LAB — MAGNESIUM: Magnesium: 2 mg/dL (ref 1.5–2.5)

## 2021-01-02 NOTE — Progress Notes (Signed)
Patient is aware of lab results and instructions. -e welch

## 2021-01-05 ENCOUNTER — Other Ambulatory Visit: Payer: Self-pay | Admitting: Internal Medicine

## 2021-01-05 DIAGNOSIS — Z1231 Encounter for screening mammogram for malignant neoplasm of breast: Secondary | ICD-10-CM

## 2021-01-06 DIAGNOSIS — Z1211 Encounter for screening for malignant neoplasm of colon: Secondary | ICD-10-CM | POA: Diagnosis not present

## 2021-01-06 DIAGNOSIS — Z86010 Personal history of colonic polyps: Secondary | ICD-10-CM | POA: Diagnosis not present

## 2021-01-06 DIAGNOSIS — K648 Other hemorrhoids: Secondary | ICD-10-CM | POA: Diagnosis not present

## 2021-01-06 LAB — HM COLONOSCOPY

## 2021-01-21 ENCOUNTER — Encounter: Payer: Self-pay | Admitting: Internal Medicine

## 2021-01-26 ENCOUNTER — Ambulatory Visit
Admission: RE | Admit: 2021-01-26 | Discharge: 2021-01-26 | Disposition: A | Discharge status: Home / Self Care | Payer: Medicare Other | Source: Ambulatory Visit | Attending: Adult Health | Admitting: Adult Health

## 2021-01-26 DIAGNOSIS — Z87891 Personal history of nicotine dependence: Secondary | ICD-10-CM

## 2021-01-26 DIAGNOSIS — Z122 Encounter for screening for malignant neoplasm of respiratory organs: Secondary | ICD-10-CM

## 2021-01-27 DIAGNOSIS — M542 Cervicalgia: Secondary | ICD-10-CM | POA: Diagnosis not present

## 2021-01-27 DIAGNOSIS — G894 Chronic pain syndrome: Secondary | ICD-10-CM | POA: Diagnosis not present

## 2021-01-27 DIAGNOSIS — M4326 Fusion of spine, lumbar region: Secondary | ICD-10-CM | POA: Diagnosis not present

## 2021-01-28 ENCOUNTER — Other Ambulatory Visit: Payer: Self-pay | Admitting: Adult Health

## 2021-01-28 ENCOUNTER — Encounter: Payer: Self-pay | Admitting: Adult Health

## 2021-01-28 DIAGNOSIS — I2510 Atherosclerotic heart disease of native coronary artery without angina pectoris: Secondary | ICD-10-CM | POA: Insufficient documentation

## 2021-01-28 MED ORDER — ASPIRIN EC 81 MG PO TBEC: 81.0000 mg | DELAYED_RELEASE_TABLET | Freq: Every day | ORAL | 3 refills | Status: AC

## 2021-01-30 ENCOUNTER — Other Ambulatory Visit: Payer: Self-pay | Admitting: Adult Health

## 2021-01-30 MED ORDER — EZETIMIBE 10 MG PO TABS: ORAL_TABLET | ORAL | 3 refills | Status: DC

## 2021-02-13 ENCOUNTER — Ambulatory Visit
Admission: RE | Admit: 2021-02-13 | Discharge: 2021-02-13 | Disposition: A | Discharge status: Home / Self Care | Payer: Medicare Other | Source: Ambulatory Visit | Attending: Internal Medicine | Admitting: Internal Medicine

## 2021-02-13 DIAGNOSIS — Z1231 Encounter for screening mammogram for malignant neoplasm of breast: Secondary | ICD-10-CM | POA: Diagnosis not present

## 2021-02-14 NOTE — Progress Notes (Deleted)
Assessment and Plan:  1. Atherosclerosis of native coronary artery of native heart without angina pectoris ***  2. Hyperlipidemia, mixed ***  3. Medication management ***     Further disposition pending results of labs. Discussed med's effects and SE's.   Over 30 minutes of exam, counseling, chart review, and critical decision making was performed.   Future Appointments  Date Time Provider [place]  02/18/2021 11:30 AM Liane Comber, NP [facility] None  04/06/2021  3:30 PM Unk Pinto, MD [facility] None  07/16/2021 11:00 AM Liane Comber, NP [facility] None    ------------------------------------------------------------------------------------------------------------------   HPI There were no vitals taken for this visit. 65 y.o.female presents requesting to discuss recent CT scan results in person.   She is a former smoker (quit 2019, 30 pack year), recently underwent screening low dose CT on 01/26/2021 that showed:   1. Lung-RADS 1, negative. Continue annual screening with low-dose chest CT without contrast in 12 months. 2. Aortic Atherosclerosis (ICD10-I70.0) and Emphysema (ICD10-J43.9). 3. Age advanced coronary artery atherosclerosis. Recommend assessment of coronary risk factors and consideration of medical therapy.   She was recommended more aggressive LDL control and ASA.   Zetia was added to pravastatin per her preference rather than switch to high intensity agent *** She reported already taking daily bASA Lab Results  Component Value Date   CHOL 156 01/01/2021   HDL 60 01/01/2021   LDLCALC 71 01/01/2021   TRIG 171 (H) 01/01/2021   CHOLHDL 2.6 01/01/2021     Past Medical History:  Diagnosis Date   Allergy    Arthritis    AVN (avascular necrosis of bone) (HCC)    Left shoulder   COPD (chronic obstructive pulmonary disease) (HCC)    Depression    GERD (gastroesophageal reflux disease)    Hyperlipidemia    Hypertension    Memory  disorder 12/23/2016   Osteoporosis    TIA (transient ischemic attack) 09/30/2017   Vitamin D deficiency      Allergies  Allergen Reactions   Augmentin [Amoxicillin-Pot Clavulanate] Nausea And Vomiting   Codeine Nausea And Vomiting   Zoloft [Sertraline Hcl]     Pt does not recall reaction    Lipitor [Atorvastatin] Other (See Comments)    Leg pain    Current Outpatient Medications on File Prior to Visit  Medication Sig   acetaminophen (TYLENOL) 650 MG CR tablet Take by mouth. Takes 2 tablets daily.   alendronate (FOSAMAX) 70 MG tablet Take 1 tablet (70 mg total) by mouth every 7 (seven) days. Take with a full glass of water on an empty stomach. (Patient not taking: Reported on 01/01/2021)   aspirin EC 81 MG tablet Take 1 tablet (81 mg total) by mouth daily. Swallow whole.   baclofen (LIORESAL) 20 MG tablet Take 20 mg by mouth 3 (three) times daily as needed for muscle spasms.   cholecalciferol 5000 units TABS Take 2 tablets daily   DULoxetine (CYMBALTA) 60 MG capsule Take  1 capsule  Daily  for Chronic Pain   ezetimibe (ZETIA) 10 MG tablet Take 1 tab daily for cholesterol and heart.   gabapentin (NEURONTIN) 800 MG tablet Take 1/2 to 1 tablet 3 x /day as needed for Pain   ketotifen (ZADITOR) 0.025 % ophthalmic solution Place 1 drop into both eyes 3 (three) times daily as needed (allergies).   olmesartan (BENICAR) 20 MG tablet Take one tablet by mouth daily. (Patient taking differently: as needed. Take one tablet by mouth daily.)   oxyCODONE (ROXICODONE) 15  MG immediate release tablet Take 15 mg by mouth every 6 (six) hours as needed for pain.    simvastatin (ZOCOR) 40 MG tablet Take  1 tablet  at Bedtime  for Cholesterol   No current facility-administered medications on file prior to visit.    ROS: all negative except above.   Physical Exam:  There were no vitals taken for this visit.  General Appearance: Well nourished, in no apparent distress. Eyes: PERRLA, EOMs, conjunctiva  no swelling or erythema Sinuses: No Frontal/maxillary tenderness ENT/Mouth: Ext aud canals clear, TMs without erythema, bulging. No erythema, swelling, or exudate on post pharynx.  Tonsils not swollen or erythematous. Hearing normal.  Neck: Supple, thyroid normal.  Respiratory: Respiratory effort normal, BS equal bilaterally without rales, rhonchi, wheezing or stridor.  Cardio: RRR with no MRGs. Brisk peripheral pulses without edema.  Abdomen: Soft, + BS.  Non tender, no guarding, rebound, hernias, masses. Lymphatics: Non tender without lymphadenopathy.  Musculoskeletal: Full ROM, 5/5 strength, normal gait.  Skin: Warm, dry without rashes, lesions, ecchymosis.  Neuro: Cranial nerves intact. Normal muscle tone, no cerebellar symptoms. Sensation intact.  Psych: Awake and oriented X 3, normal affect, Insight and Judgment appropriate.     Izora Ribas, NP 8:49 AM [facility]

## 2021-02-18 ENCOUNTER — Other Ambulatory Visit: Payer: Self-pay

## 2021-02-18 ENCOUNTER — Encounter: Payer: Self-pay | Admitting: Adult Health

## 2021-02-18 ENCOUNTER — Ambulatory Visit (INDEPENDENT_AMBULATORY_CARE_PROVIDER_SITE_OTHER): Payer: Medicare Other | Admitting: Adult Health

## 2021-02-18 ENCOUNTER — Ambulatory Visit: Payer: Medicare Other | Admitting: Adult Health

## 2021-02-18 VITALS — BP 138/78 | HR 62 | Temp 97.5°F | Wt 159.0 lb

## 2021-02-18 DIAGNOSIS — I2510 Atherosclerotic heart disease of native coronary artery without angina pectoris: Secondary | ICD-10-CM

## 2021-02-18 DIAGNOSIS — Z79899 Other long term (current) drug therapy: Secondary | ICD-10-CM

## 2021-02-18 DIAGNOSIS — R0609 Other forms of dyspnea: Secondary | ICD-10-CM

## 2021-02-18 DIAGNOSIS — E782 Mixed hyperlipidemia: Secondary | ICD-10-CM

## 2021-02-18 DIAGNOSIS — I10 Essential (primary) hypertension: Secondary | ICD-10-CM

## 2021-02-18 DIAGNOSIS — Z8249 Family history of ischemic heart disease and other diseases of the circulatory system: Secondary | ICD-10-CM

## 2021-02-18 MED ORDER — ROSUVASTATIN CALCIUM 40 MG PO TABS: ORAL_TABLET | ORAL | 3 refills | Status: DC

## 2021-02-18 NOTE — Progress Notes (Signed)
Assessment and Plan:  Lanai was seen today for follow-up.  Diagnoses and all orders for this visit:  Atherosclerosis of native coronary artery of native heart without angina pectoris Advanced for age on non-cardiac exam Discussed and cardiology referral placed Proceed with aggressive medical therapy  Will aim for LDL <55, switch to high intensity statin, continue baby aspirin, lifestyle/diet/exercise reviewed Will refer to cardiology due to exertional dyspnea and possible upcoming surgeries.  -     Ambulatory referral to Cardiology  Exertional dyspnea -     Ambulatory referral to Cardiology  Family history of cardiovascular disease -     Ambulatory referral to Cardiology  Hyperlipidemia, mixed LDL <55, switch to high intensity statin Follow up recheck labs as scheduled in 6-8 weeks  -     rosuvastatin (CRESTOR) 40 MG tablet; Take 1 tab daily for cholesterol.  Hypertension Taking med irregularly, fear of low BP Discussed importance of regular adherence with BP meds Try 1/2 tab daily for 1-2 weeks, keep close log, call office if any low <110/70, if frequently above 130/80 increase to whole tab, take at  Encouraged DASH diet.   Reminder to go to the ER if any CP, SOB, nausea, dizziness, severe HA, changes vision/speech, left arm numbness and tingling and jaw pain.   Further disposition pending results of labs. Discussed med's effects and SE's.   Over 30 minutes of exam, counseling, chart review, and critical decision making was performed.   Future Appointments  Date Time Provider [place]  04/06/2021  3:30 PM Unk Pinto, MD [facility] None  07/16/2021 11:00 AM Liane Comber, NP [facility] None    ------------------------------------------------------------------------------------------------------------------   HPI BP 138/78    Pulse 62    Temp (!) 97.5 F (36.4 C)    Wt 159 lb (72.1 kg)    SpO2 97%    BMI 27.29 kg/m  66 y.o.female presents requesting to  discuss recent CT scan results in person.   She is a former smoker (quit 2019, 30 pack year), recently underwent screening low dose CT on 01/26/2021 that showed:   1. Lung-RADS 1, negative. Continue annual screening with low-dose chest CT without contrast in 12 months. 2. Aortic Atherosclerosis (ICD10-I70.0) and Emphysema (ICD10-J43.9). 3. Age advanced coronary artery atherosclerosis. Recommend assessment of coronary risk factors and consideration of medical therapy.   She was recommended more aggressive LDL control and ASA.  Zetia was added to pravastatin per her preference rather than switch to high intensity agent however benefits of high intensity statin and benefits of aggressive LDL goal of <55 reviewed, she is agreeable to trying rosuvastatin 40 mg daily.   She reported already taking daily bASA Lab Results  Component Value Date   CHOL 156 01/01/2021   HDL 60 01/01/2021   LDLCALC 71 01/01/2021   TRIG 171 (H) 01/01/2021   CHOLHDL 2.6 01/01/2021   She denies chest pain but does endorse exertional dyspnea. She is concerned about strong family hx of cardiovascular disease. She has low risk myoview in 2017, also ECHO and holter, however with proceed with cardiology referral for their input.    She reports labile BPs at home, admits taking olmesartan irregularly, today their BP is BP: 138/78  She does not workout. She denies chest pain, but does report chronic exertional shortness of breath, worse when she rushes.      Past Medical History:  Diagnosis Date   Allergy    Arthritis    AVN (avascular necrosis of bone) ([place])  Left shoulder   COPD (chronic obstructive pulmonary disease) (HCC)    Depression    GERD (gastroesophageal reflux disease)    Hyperlipidemia    Hypertension    Memory disorder 12/23/2016   Osteoporosis    TIA (transient ischemic attack) 09/30/2017   Vitamin D deficiency      Allergies  Allergen Reactions   Augmentin [Amoxicillin-Pot  Clavulanate] Nausea And Vomiting   Codeine Nausea And Vomiting   Zoloft [Sertraline Hcl]     Pt does not recall reaction    Lipitor [Atorvastatin] Other (See Comments)    Leg pain    Current Outpatient Medications on File Prior to Visit  Medication Sig   aspirin EC 81 MG tablet Take 1 tablet (81 mg total) by mouth daily. Swallow whole.   Aspirin-Acetaminophen (GOODYS BODY PAIN PO) Take by mouth as needed.   baclofen (LIORESAL) 20 MG tablet Take 20 mg by mouth 3 (three) times daily as needed for muscle spasms.   cholecalciferol 5000 units TABS Take 2 tablets daily   DULoxetine (CYMBALTA) 60 MG capsule Take  1 capsule  Daily  for Chronic Pain   gabapentin (NEURONTIN) 800 MG tablet Take 1/2 to 1 tablet 3 x /day as needed for Pain   ketotifen (ZADITOR) 0.025 % ophthalmic solution Place 1 drop into both eyes 3 (three) times daily as needed (allergies).   olmesartan (BENICAR) 20 MG tablet Take one tablet by mouth daily. (Patient taking differently: as needed. Take one tablet by mouth daily.)   oxyCODONE (ROXICODONE) 15 MG immediate release tablet Take 15 mg by mouth every 6 (six) hours as needed for pain.    acetaminophen (TYLENOL) 650 MG CR tablet Take by mouth. Takes 2 tablets daily. (Patient not taking: Reported on 02/18/2021)   alendronate (FOSAMAX) 70 MG tablet Take 1 tablet (70 mg total) by mouth every 7 (seven) days. Take with a full glass of water on an empty stomach. (Patient not taking: Reported on 01/01/2021)   No current facility-administered medications on file prior to visit.   Allergies:  Allergies  Allergen Reactions   Augmentin [Amoxicillin-Pot Clavulanate] Nausea And Vomiting   Codeine Nausea And Vomiting   Zoloft [Sertraline Hcl]     Pt does not recall reaction    Lipitor [Atorvastatin] Other (See Comments)    Leg pain   Surgical History:  She  has a past surgical history that includes Cataract extraction (Bilateral); Spine surgery; Ankle surgery (Right); Tonsillectomy  and adenoidectomy; Back surgery; and Breast excisional biopsy (Right). Family History:  Herfamily history includes COPD in her mother; Cancer in her mother; Coronary artery disease in her paternal uncle; Heart disease in her father; Hypertension in her mother; Stroke in her mother. Social History:   reports that she quit smoking about 3 years ago. Her smoking use included cigarettes. She has a 30.00 pack-year smoking history. She has never used smokeless tobacco. She reports that she does not drink alcohol and does not use drugs.   ROS: all negative except above.   Physical Exam:  BP 138/78    Pulse 62    Temp (!) 97.5 F (36.4 C)    Wt 159 lb (72.1 kg)    SpO2 97%    BMI 27.29 kg/m   General Appearance: Well nourished, in no apparent distress. Eyes: PERRLA, EOMs, conjunctiva no swelling or erythema Sinuses: No Frontal/maxillary tenderness ENT/Mouth: Ext aud canals clear, TMs without erythema, bulging. No erythema, swelling, or exudate on post pharynx.  Tonsils not swollen or  erythematous. Hearing normal.  Neck: Supple, thyroid normal.  Respiratory: Respiratory effort normal, BS equal bilaterally without rales, rhonchi, wheezing or stridor.  Cardio: RRR with no MRGs. Brisk peripheral pulses without edema.  Abdomen: Soft, + BS.  Non tender, no guarding, rebound, hernias, masses. Lymphatics: Non tender without lymphadenopathy.  Musculoskeletal: slow steady gait Skin: Warm, dry without rashes, lesions, ecchymosis.  Neuro: Cranial nerves intact. Normal muscle tone, no cerebellar symptoms. Sensation intact.  Psych: Awake and oriented X 3, normal affect, Insight and Judgment appropriate.     Izora Ribas, NP 1:10 PM [facility]

## 2021-02-27 DIAGNOSIS — M5106 Intervertebral disc disorders with myelopathy, lumbar region: Secondary | ICD-10-CM | POA: Diagnosis not present

## 2021-02-27 DIAGNOSIS — Z79899 Other long term (current) drug therapy: Secondary | ICD-10-CM | POA: Diagnosis not present

## 2021-02-27 DIAGNOSIS — M4326 Fusion of spine, lumbar region: Secondary | ICD-10-CM | POA: Diagnosis not present

## 2021-02-27 DIAGNOSIS — G894 Chronic pain syndrome: Secondary | ICD-10-CM | POA: Diagnosis not present

## 2021-02-27 DIAGNOSIS — M542 Cervicalgia: Secondary | ICD-10-CM | POA: Diagnosis not present

## 2021-02-27 DIAGNOSIS — Z79891 Long term (current) use of opiate analgesic: Secondary | ICD-10-CM | POA: Diagnosis not present

## 2021-03-09 ENCOUNTER — Other Ambulatory Visit: Payer: Self-pay | Admitting: Internal Medicine

## 2021-03-09 MED ORDER — HYDROCHLOROTHIAZIDE 25 MG PO TABS: ORAL_TABLET | ORAL | 3 refills | Status: DC

## 2021-03-09 NOTE — Progress Notes (Signed)
Referring-Ashley Corbett, NP Reason for referral-dyspnea, chest pain and family history of coronary artery disease  HPI: 66 year old female for evaluation of dyspnea, chest pain and family history of coronary artery disease at request of Liane Comber, NP.  I have seen the patient previously but not since 2009.  Nuclear study March 2017 showed ejection fraction 57% and no ischemia or infarction.  Echocardiogram August 2019 showed normal LV function, grade 1 diastolic dysfunction, trace aortic insufficiency.  ABIs June 2022 normal.  Chest CT December 2022 showed aortic atherosclerosis, emphysema and coronary artery atherosclerosis.  Patient has occasional chest pain that she has had for years.  It is in the left chest area and described as a pressure.  No radiation.  Not pleuritic or positional.  Typically lasts 10 to 15 minutes and resolve spontaneously.  It is not exertional and there are no associated symptoms.  She also has dyspnea on exertion but no orthopnea, PND, pedal edema or syncope.  Current Outpatient Medications  Medication Sig Dispense Refill   aspirin EC 81 MG tablet Take 1 tablet (81 mg total) by mouth daily. Swallow whole. 90 tablet 3   Aspirin-Acetaminophen (GOODYS BODY PAIN PO) Take by mouth as needed.     baclofen (LIORESAL) 20 MG tablet Take 20 mg by mouth 3 (three) times daily as needed for muscle spasms.     cholecalciferol 5000 units TABS Take 2 tablets daily     DULoxetine (CYMBALTA) 60 MG capsule Take  1 capsule  Daily  for Chronic Pain 90 capsule 3   gabapentin (NEURONTIN) 800 MG tablet Take 1/2 to 1 tablet 3 x /day as needed for Pain 90 tablet 0   hydrochlorothiazide (HYDRODIURIL) 25 MG tablet Take 1 tablet daily for BP and fluid 90 tablet 3   olmesartan (BENICAR) 20 MG tablet Take one tablet by mouth daily. (Patient taking differently: as needed. Take one tablet by mouth daily.) 30 tablet 11   oxyCODONE (ROXICODONE) 15 MG immediate release tablet Take 15 mg by mouth  every 6 (six) hours as needed for pain.   0   rosuvastatin (CRESTOR) 40 MG tablet Take 1 tab daily for cholesterol. 90 tablet 3   ketotifen (ZADITOR) 0.025 % ophthalmic solution Place 1 drop into both eyes 3 (three) times daily as needed (allergies). (Patient not taking: Reported on 03/20/2021)     No current facility-administered medications for this visit.    Allergies  Allergen Reactions   Augmentin [Amoxicillin-Pot Clavulanate] Nausea And Vomiting   Codeine Nausea And Vomiting   Zoloft [Sertraline Hcl]     Pt does not recall reaction    Lipitor [Atorvastatin] Other (See Comments)    Leg pain     Past Medical History:  Diagnosis Date   Allergy    Arthritis    AVN (avascular necrosis of bone) (HCC)    Left shoulder   COPD (chronic obstructive pulmonary disease) (HCC)    Depression    GERD (gastroesophageal reflux disease)    Hyperlipidemia    Hypertension    Memory disorder 12/23/2016   Osteoporosis    TIA (transient ischemic attack) 09/30/2017   Vitamin D deficiency     Past Surgical History:  Procedure Laterality Date   ANKLE SURGERY Right    BACK SURGERY     BREAST EXCISIONAL BIOPSY Right    CATARACT EXTRACTION Bilateral    SPINE SURGERY     lumbar   TONSILLECTOMY AND ADENOIDECTOMY      Social History  Socioeconomic History   Marital status: Single    Spouse name: Not on file   Number of children: Not on file   Years of education: 15   Highest education level: Not on file  Occupational History   Not on file  Tobacco Use   Smoking status: Former    Packs/day: 1.00    Years: 30.00    Pack years: 30.00    Types: Cigarettes    Quit date: 05/10/2017    Years since quitting: 3.8   Smokeless tobacco: Never  Vaping Use   Vaping Use: Never used  Substance and Sexual Activity   Alcohol use: No   Drug use: No   Sexual activity: Not Currently  Other Topics Concern   Not on file  Social History Narrative   Lives alone   Caffeine use: very rare    Social Determinants of Health   Financial Resource Strain: Not on file  Food Insecurity: Not on file  Transportation Needs: Not on file  Physical Activity: Not on file  Stress: Not on file  Social Connections: Not on file  Intimate Partner Violence: Not on file    Family History  Problem Relation Age of Onset   Hypertension Mother    COPD Mother    Stroke Mother    Cancer Mother        breast   Heart disease Father        Age 96, MI   Coronary artery disease Paternal Uncle    Breast cancer Neg Hx     ROS: no fevers or chills, productive cough, hemoptysis, dysphasia, odynophagia, melena, hematochezia, dysuria, hematuria, rash, seizure activity, orthopnea, PND, pedal edema, claudication. Remaining systems are negative.  Physical Exam:   Blood pressure 90/60, height 5' (1.524 m), weight 162 lb 9.6 oz (73.8 kg), SpO2 98 %.  General:  Well developed/well nourished in NAD Skin warm/dry Patient not depressed No peripheral clubbing Back-normal HEENT-normal/normal eyelids Neck supple/normal carotid upstroke bilaterally; no bruits; no JVD; no thyromegaly chest - CTA/ normal expansion CV - RRR/normal S1 and S2; no murmurs, rubs or gallops;  PMI nondisplaced Abdomen -NT/ND, no HSM, no mass, + bowel sounds, no bruit 2+ femoral pulses, no bruits Ext-no edema, chords, 2+ DP Neuro-grossly nonfocal  ECG -normal sinus rhythm at a rate of 92, no ST changes.  Personally reviewed  A/P  1 dyspnea-likely secondary to COPD.  Note previous echocardiogram showed normal LV function.  She is not volume overloaded on examination.  2 CP-CTA to rule out obstructive coronary disease though symptoms are somewhat atypical.  Note she has a strong family history of coronary disease as her father had a myocardial infarction at age 65.  3 hypertension-patient's blood pressure is controlled.  Continue present medications.  4 hyperlipidemia-continue statin.  5 coronary artery disease-based on  calcium noted in the coronaries on previous CT scan.  Continue aspirin and statin.  Kirk Ruths, MD

## 2021-03-10 DIAGNOSIS — H5203 Hypermetropia, bilateral: Secondary | ICD-10-CM | POA: Diagnosis not present

## 2021-03-10 DIAGNOSIS — H43813 Vitreous degeneration, bilateral: Secondary | ICD-10-CM | POA: Diagnosis not present

## 2021-03-10 DIAGNOSIS — Z961 Presence of intraocular lens: Secondary | ICD-10-CM | POA: Diagnosis not present

## 2021-03-20 ENCOUNTER — Other Ambulatory Visit: Payer: Self-pay

## 2021-03-20 ENCOUNTER — Encounter: Payer: Self-pay | Admitting: Cardiology

## 2021-03-20 ENCOUNTER — Ambulatory Visit: Payer: Medicare Other | Admitting: Cardiology

## 2021-03-20 VITALS — BP 90/60 | Ht 60.0 in | Wt 162.6 lb

## 2021-03-20 DIAGNOSIS — E7800 Pure hypercholesterolemia, unspecified: Secondary | ICD-10-CM

## 2021-03-20 DIAGNOSIS — I10 Essential (primary) hypertension: Secondary | ICD-10-CM | POA: Diagnosis not present

## 2021-03-20 DIAGNOSIS — R0602 Shortness of breath: Secondary | ICD-10-CM

## 2021-03-20 DIAGNOSIS — I2510 Atherosclerotic heart disease of native coronary artery without angina pectoris: Secondary | ICD-10-CM

## 2021-03-20 MED ORDER — METOPROLOL TARTRATE 100 MG PO TABS: ORAL_TABLET | ORAL | 0 refills | Status: DC

## 2021-03-20 NOTE — Patient Instructions (Signed)
°  Testing/Procedures:   Your cardiac CT will be scheduled at   [facility] [place], [place] [id number] [phone]  If scheduled at [facility], please arrive at the [facility] main entrance (entrance A) of [facility] 30 minutes prior to test start time. You can use the FREE valet parking offered at the main entrance (encouraged to control the heart rate for the test) Proceed to the [facility] Radiology Department (first floor) to check-in and test prep.   Please follow these instructions carefully (unless otherwise directed):   On the Night Before the Test: Be sure to Drink plenty of water. Do not consume any caffeinated/decaffeinated beverages or chocolate 12 hours prior to your test. Do not take any antihistamines 12 hours prior to your test.   On the Day of the Test: Drink plenty of water until 1 hour prior to the test. Do not eat any food 4 hours prior to the test. You may take your regular medications prior to the test.  DO NOT TAKE BENICAR/HCTZ THE MORNING OF THE SCAN Take metoprolol (Lopressor) 100 MG two hours prior to test. HOLD Furosemide/Hydrochlorothiazide morning of the test. FEMALES- please wear underwire-free bra if available, avoid dresses & tight clothing       After the Test: Drink plenty of water. After receiving IV contrast, you may experience a mild flushed feeling. This is normal. On occasion, you may experience a mild rash up to 24 hours after the test. This is not dangerous. If this occurs, you can take Benadryl 25 mg and increase your fluid intake. If you experience trouble breathing, this can be serious. If it is severe call 911 IMMEDIATELY. If it is mild, please call our office. If you take any of these medications: Glipizide/Metformin, Avandament, Glucavance, please do not take 48 hours after completing test unless otherwise instructed.  We will call to schedule your test 2-4 weeks out understanding  that some insurance companies will need an authorization prior to the service being performed.   For non-scheduling related questions, please contact the cardiac imaging nurse navigator should you have any questions/concerns: Marchia Bond, Cardiac Imaging Nurse Navigator Gordy Clement, Cardiac Imaging Nurse Navigator [facility] Direct Office Dial: [phone]   For scheduling needs, including cancellations and rescheduling, please call [place], [phone].    Follow-Up: At [facility], you and your health needs are our priority.  As part of our continuing mission to provide you with exceptional heart care, we have created designated Provider Care Teams.  These Care Teams include your primary Cardiologist (physician) and Advanced Practice Providers (APPs -  Physician Assistants and Nurse Practitioners) who all work together to provide you with the care you need, when you need it.  We recommend signing up for the patient portal called "MyChart".  Sign up information is provided on this After Visit Summary.  MyChart is used to connect with patients for Virtual Visits (Telemedicine).  Patients are able to view lab/test results, encounter notes, upcoming appointments, etc.  Non-urgent messages can be sent to your provider as well.   To learn more about what you can do with MyChart, go to [email or web address].    Your next appointment:   6 month(s)  The format for your next appointment:   In Person  Provider:   Kirk Ruths MD

## 2021-03-26 DIAGNOSIS — I2510 Atherosclerotic heart disease of native coronary artery without angina pectoris: Secondary | ICD-10-CM | POA: Diagnosis not present

## 2021-03-27 ENCOUNTER — Other Ambulatory Visit: Payer: Self-pay | Admitting: Internal Medicine

## 2021-03-27 DIAGNOSIS — G894 Chronic pain syndrome: Secondary | ICD-10-CM

## 2021-03-27 DIAGNOSIS — S025XXS Fracture of tooth (traumatic), sequela: Secondary | ICD-10-CM

## 2021-03-27 LAB — BASIC METABOLIC PANEL
BUN/Creatinine Ratio: 27 (ref 12–28)
BUN: 27 mg/dL (ref 8–27)
CO2: 24 mmol/L (ref 20–29)
Calcium: 10.6 mg/dL — ABNORMAL HIGH (ref 8.7–10.3)
Chloride: 101 mmol/L (ref 96–106)
Creatinine, Ser: 0.99 mg/dL (ref 0.57–1.00)
Glucose: 118 mg/dL — ABNORMAL HIGH (ref 70–99)
Potassium: 5.4 mmol/L — ABNORMAL HIGH (ref 3.5–5.2)
Sodium: 139 mmol/L (ref 134–144)
eGFR: 63 mL/min/{1.73_m2} (ref >59)

## 2021-03-30 DIAGNOSIS — M4326 Fusion of spine, lumbar region: Secondary | ICD-10-CM | POA: Diagnosis not present

## 2021-03-30 DIAGNOSIS — M542 Cervicalgia: Secondary | ICD-10-CM | POA: Diagnosis not present

## 2021-03-30 DIAGNOSIS — G894 Chronic pain syndrome: Secondary | ICD-10-CM | POA: Diagnosis not present

## 2021-03-30 DIAGNOSIS — M5106 Intervertebral disc disorders with myelopathy, lumbar region: Secondary | ICD-10-CM | POA: Diagnosis not present

## 2021-03-31 ENCOUNTER — Telehealth (HOSPITAL_COMMUNITY): Payer: Self-pay | Admitting: *Deleted

## 2021-03-31 NOTE — Telephone Encounter (Signed)
Reaching out to patient to offer assistance regarding upcoming cardiac imaging study; pt verbalizes understanding of appt date/time, parking situation and where to check in, pre-test NPO status and medications ordered, and verified current allergies; name and call back number provided for further questions should they arise  Gordy Clement RN Navigator Cardiac Imaging [facility] [phone] office [phone] cell  Patient to take 100mg  metoprolol tartrate two hours prior to her cardiac CT scan. She is aware to arrive at 10:30am for her 11am scan.

## 2021-04-02 ENCOUNTER — Encounter (HOSPITAL_COMMUNITY): Payer: Self-pay

## 2021-04-02 ENCOUNTER — Ambulatory Visit (HOSPITAL_COMMUNITY)
Admission: RE | Admit: 2021-04-02 | Discharge: 2021-04-02 | Disposition: A | Discharge status: Home / Self Care | Payer: Medicare Other | Source: Ambulatory Visit | Attending: Cardiology | Admitting: Cardiology

## 2021-04-02 ENCOUNTER — Other Ambulatory Visit: Payer: Self-pay

## 2021-04-02 DIAGNOSIS — I2510 Atherosclerotic heart disease of native coronary artery without angina pectoris: Secondary | ICD-10-CM | POA: Insufficient documentation

## 2021-04-02 DIAGNOSIS — I700 Atherosclerosis of aorta: Secondary | ICD-10-CM

## 2021-04-02 MED ORDER — IOHEXOL 350 MG/ML SOLN
95.0000 mL | Freq: Once | INTRAVENOUS | Status: AC | PRN
  Administered 2021-04-02: 95 mL via INTRAVENOUS

## 2021-04-02 MED ORDER — NITROGLYCERIN 0.4 MG SL SUBL
SUBLINGUAL_TABLET | SUBLINGUAL | Status: AC
  Administered 2021-04-02: 0.8 mg via SUBLINGUAL
  Filled 2021-04-02: qty 2

## 2021-04-02 MED ORDER — NITROGLYCERIN 0.4 MG SL SUBL
0.8000 mg | SUBLINGUAL_TABLET | Freq: Once | SUBLINGUAL | Status: AC
Start: 2021-04-02 — End: 2021-04-02

## 2021-04-02 MED ORDER — NITROGLYCERIN 0.4 MG SL SUBL: 0.8000 mg | SUBLINGUAL_TABLET | Freq: Once | SUBLINGUAL | Status: DC

## 2021-04-05 NOTE — Progress Notes (Signed)
Future Appointments  Date Time Provider Department  04/06/2021  3:30 PM Unk Pinto, MD [facility]  07/16/2021            Wellness 11:00 AM Liane Comber, NP [facility]    History of Present Illness:       This very nice 66 y.o. single WF  presents for 6 month follow up with HTN, HLD, Pre-Diabetes and Vitamin D Deficiency.  Patient has hx/o OSA , but is intolerant to CPAP.  Chest CT scan in Aug 2021 showed Aortic Atherosclerosis. Patient is on Prozac for Major Depression.           Patient does have COPD  & Chronic Bronchitis consequent of a 30+ yr smoking hx and is c/o cough today.                                                       The patient is followed in pain management and is on SS Disability for chronic pain from Lumbar DDD &  surg x 2 in 2004.        Patient is treated for HTN  (1998) & BP has been controlled at home. Todays BP is at goal -  120/70. Patient has had no complaints of any cardiac type chest pain, palpitations, dyspnea / orthopnea / PND, dizziness, claudication, or dependent edema.      Hyperlipidemia is controlled with diet & meds. Patient denies myalgias or other med SEs. Last Lipids were at goal except slightly elevated Trig's :  Lab Results  Component Value Date   CHOL 156 01/01/2021   HDL 60 01/01/2021   LDLCALC 71 01/01/2021   TRIG 171 (H) 01/01/2021   CHOLHDL 2.6 01/01/2021                                                      Also, the patient has history of PreDiabetes (A1c 5.8% /2010) and has had no symptoms of reactive hypoglycemia, diabetic polys, paresthesias or visual blurring.  Last A1c was not at goal :  Lab Results  Component Value Date   HGBA1C 6.0 (H) 09/23/2020                                                            Further, the patient also has history of Vitamin D Deficiency ("20" /2008) and supplements vitamin D without any suspected side-effects. Last vitamin D was at goal :   Lab Results  Component Value Date    VD25OH 75 09/23/2020    Current Outpatient Medications on File Prior to Visit  Medication Sig   aspirin EC 81 MG tablet Take 1 tablet  daily.    Aspirin-Acetaminophen (GOODYS BODY PAIN) Take  as needed.   baclofen  20 MG tablet Take  3 times daily as needed for muscle spasms.   cholecalciferol 5000 units TABS Take 2 tablets daily   DULoxetine ( 60 MG capsule Take  1 capsule  Daily  for  Chronic Pain   gabapentin  800 MG tablet Take  1 tablet 3 x /day  for Chronic Pain   hydrochlorothiazide  25 MG tablet Take 1 tablet daily for BP and fluid   metoprolol tartrate 100 MG tablet TAKE 2 HOURS PRIOR TO CT SCAN   olmesartan 20 MG tablet Take one tablet daily   oxyCODONE 15 MG immediate release tabl Take 15 mg every 6  hours as needed for pain.    rosuvastatin  40 MG tablet Take 1 tab daily for cholesterol.     Allergies  Allergen Reactions   Augmentin [Amoxicillin-Pot Clavulanate] Nausea And Vomiting   Codeine Nausea And Vomiting   Zoloft [Sertraline Hcl]     Pt does not recall reaction    Lipitor [Atorvastatin] Other (See Comments)    Leg pain    PMHx:   Past Medical History:  Diagnosis Date   Allergy    Arthritis    AVN (avascular necrosis of bone) (HCC)    Left shoulder   COPD (chronic obstructive pulmonary disease) (HCC)    Depression    GERD (gastroesophageal reflux disease)    Hyperlipidemia    Hypertension    Memory disorder 12/23/2016   Osteoporosis    TIA (transient ischemic attack) 09/30/2017   Vitamin D deficiency    Immunization History  Administered Date(s) Administered   PFIZER SARS-COV-2 Vacc 07/02/2019, 07/24/2019   Td 01/19/2004, 06/05/2015   Past Surgical History:  Procedure Laterality Date   ANKLE SURGERY Right    BACK SURGERY     BREAST EXCISIONAL BIOPSY Right    CATARACT EXTRACTION Bilateral    SPINE SURGERY     lumbar   TONSILLECTOMY AND ADENOIDECTOMY      FHx:    Reviewed / unchanged  SHx:    Reviewed / unchanged   Systems  Review:  Constitutional: Denies fever, chills, wt changes, headaches, insomnia, fatigue, night sweats, change in appetite. Eyes: Denies redness, blurred vision, diplopia, discharge, itchy, watery eyes.  ENT: Denies discharge, congestion, post nasal drip, epistaxis, sore throat, earache, hearing loss, dental pain, tinnitus, vertigo, sinus pain, snoring.  CV: Denies chest pain, palpitations, irregular heartbeat, syncope, dyspnea, diaphoresis, orthopnea, PND, claudication or edema. Respiratory: denies cough, dyspnea, DOE, pleurisy, hoarseness, laryngitis, wheezing.  Gastrointestinal: Denies dysphagia, odynophagia, heartburn, reflux, water brash, abdominal pain or cramps, nausea, vomiting, bloating, diarrhea, constipation, hematemesis, melena, hematochezia  or hemorrhoids. Genitourinary: Denies dysuria, frequency, urgency, nocturia, hesitancy, discharge, hematuria or flank pain. Musculoskeletal: Denies arthralgias, myalgias, stiffness, jt. swelling, pain, limping or strain/sprain.  Skin: Denies pruritus, rash, hives, warts, acne, eczema or change in skin lesion(s). Neuro: No weakness, tremor, incoordination, spasms, paresthesia or pain. Psychiatric: Denies confusion, memory loss or sensory loss. Endo: Denies change in weight, skin or hair change.  Heme/Lymph: No excessive bleeding, bruising or enlarged lymph nodes.  Physical Exam  BP 120/70    Pulse 88    Temp 97.8 F (36.6 C)    Resp 16    Ht 5' (1.524 m)    Wt 168 lb 6.4 oz (76.4 kg)    SpO2 96%    BMI 32.89 kg/m   Appears  over nourished  and in no distress. Congested "wheezy" cough w/o stridor  Eyes: PERRLA, EOMs, conjunctiva no swelling or erythema. Sinuses: No frontal/maxillary tenderness ENT/Mouth: EAC's clear, TM's nl w/o erythema, bulging. Nares clear w/o erythema, swelling, exudates. Oropharynx clear without erythema or exudates. Oral hygiene is good. Tongue normal, non obstructing. Hearing intact.  Neck: Supple. Thyroid  not  palpable. Car 2+/2+ without bruits, nodes or JVD. Chest:  Bilat expiratory & post tussive expiratory fine & coarse rhonchi  & wheezes.  Cor: Heart sounds normal w/ regular rate and rhythm without sig. murmurs, gallops, clicks or rubs. Peripheral pulses normal and equal  without edema.  Abdomen: Soft & bowel sounds normal. Non-tender w/o guarding, rebound, hernias, masses or organomegaly.  Lymphatics: Unremarkable.  Musculoskeletal: Full ROM all peripheral extremities, joint stability, 5/5 strength and normal gait.  Skin: Warm, dry without exposed rashes, lesions or ecchymosis apparent.  Neuro: Cranial nerves intact, reflexes equal bilaterally. Sensory-motor testing grossly intact. Tendon reflexes grossly intact.  Pysch: Alert & oriented x 3.  Insight and judgement nl & appropriate. No ideations.  Assessment and Plan:  1. Essential hypertension  - Continue medication, monitor blood pressure at home.  - Continue DASH diet.  Reminder to go to the ER if any CP,  SOB, nausea, dizziness, severe HA, changes vision/speech.   - CBC with Differential/Platelet - COMPLETE METABOLIC PANEL WITH GFR - Magnesium - TSH  2. Hyperlipidemia, mixed  - Continue diet/meds, exercise,& lifestyle modifications.  - Continue monitor periodic cholesterol/liver & renal functions    - Lipid panel - TSH  3. Abnormal glucose  - Continue diet, exercise  - Lifestyle modifications.  - Monitor appropriate labs   - Hemoglobin A1c - Insulin, random  4. Vitamin D deficiency  - Continue supplementation   - VITAMIN D 25 Hydroxy   5. Aortic atherosclerosis ([place]) by Chest  CT scan 10/02/2019  - Lipid panel  6. Chronic pain syndrome   7. Medication management  - CBC with Differential/Platelet - COMPLETE METABOLIC PANEL WITH GFR - Magnesium - Lipid panel - TSH - Hemoglobin A1c - Insulin, random - VITAMIN D 25 Hydroxy   8. Chronic Asthmatic Bronchitis  - Dispensed 14 day Sx of Trelegy 100  Elliptica  & instructed patient in use of inhaler.   - Advised return in 2 weeks for re-evaluation.       Discussed  regular exercise, BP monitoring, weight control to achieve/maintain BMI less than 25 and discussed med and SE's. Recommended labs to assess and monitor clinical status with further disposition pending results of labs.  I discussed the assessment and treatment plan with the patient. The patient was provided an opportunity to ask questions and all were answered. The patient agreed with the plan and demonstrated an understanding of the instructions.  I provided over 30 minutes of exam, counseling, chart review and  complex critical decision making.  Kirtland Bouchard, MD

## 2021-04-05 NOTE — Patient Instructions (Signed)

## 2021-04-06 ENCOUNTER — Encounter: Payer: Self-pay | Admitting: Internal Medicine

## 2021-04-06 ENCOUNTER — Telehealth: Payer: Self-pay | Admitting: *Deleted

## 2021-04-06 ENCOUNTER — Other Ambulatory Visit: Payer: Self-pay

## 2021-04-06 ENCOUNTER — Ambulatory Visit (INDEPENDENT_AMBULATORY_CARE_PROVIDER_SITE_OTHER): Payer: Medicare Other | Admitting: Internal Medicine

## 2021-04-06 VITALS — BP 120/70 | HR 88 | Temp 97.8°F | Resp 16 | Ht 60.0 in | Wt 168.4 lb

## 2021-04-06 DIAGNOSIS — R7309 Other abnormal glucose: Secondary | ICD-10-CM

## 2021-04-06 DIAGNOSIS — J449 Chronic obstructive pulmonary disease, unspecified: Secondary | ICD-10-CM | POA: Diagnosis not present

## 2021-04-06 DIAGNOSIS — E559 Vitamin D deficiency, unspecified: Secondary | ICD-10-CM | POA: Diagnosis not present

## 2021-04-06 DIAGNOSIS — E782 Mixed hyperlipidemia: Secondary | ICD-10-CM | POA: Diagnosis not present

## 2021-04-06 DIAGNOSIS — E7800 Pure hypercholesterolemia, unspecified: Secondary | ICD-10-CM

## 2021-04-06 DIAGNOSIS — Z79899 Other long term (current) drug therapy: Secondary | ICD-10-CM

## 2021-04-06 DIAGNOSIS — G894 Chronic pain syndrome: Secondary | ICD-10-CM

## 2021-04-06 DIAGNOSIS — I700 Atherosclerosis of aorta: Secondary | ICD-10-CM | POA: Diagnosis not present

## 2021-04-06 DIAGNOSIS — I10 Essential (primary) hypertension: Secondary | ICD-10-CM

## 2021-04-06 MED ORDER — EZETIMIBE 10 MG PO TABS: 10.0000 mg | ORAL_TABLET | Freq: Every day | ORAL | 3 refills | Status: DC

## 2021-04-06 NOTE — Telephone Encounter (Signed)
-----   Message from Lelon Perla, MD sent at 04/03/2021  9:18 AM EST ----- Elevated Ca score; add zetia 10 mg daily and continue crestor; lipids and liver 8 weeks Kirk Ruths

## 2021-04-06 NOTE — Telephone Encounter (Signed)
Spoke with pt, Aware of dr crenshaw's recommendations.  ?New script sent to the pharmacy  ?Lab orders mailed to the pt  ?

## 2021-04-07 ENCOUNTER — Telehealth: Payer: Self-pay | Admitting: Cardiology

## 2021-04-07 LAB — CBC WITH DIFFERENTIAL/PLATELET
Absolute Monocytes: 620 cells/uL (ref 200–950)
Basophils Absolute: 33 cells/uL (ref 0–200)
Basophils Relative: 0.5 %
Eosinophils Absolute: 350 cells/uL (ref 15–500)
Eosinophils Relative: 5.3 %
HCT: 38.9 % (ref 35.0–45.0)
Hemoglobin: 12.7 g/dL (ref 11.7–15.5)
Lymphs Abs: 1749 cells/uL (ref 850–3900)
MCH: 30 pg (ref 27.0–33.0)
MCHC: 32.6 g/dL (ref 32.0–36.0)
MCV: 91.7 fL (ref 80.0–100.0)
MPV: 11.1 fL (ref 7.5–12.5)
Monocytes Relative: 9.4 %
Neutro Abs: 3848 cells/uL (ref 1500–7800)
Neutrophils Relative %: 58.3 %
Platelets: 232 10*3/uL (ref 140–400)
RBC: 4.24 10*6/uL (ref 3.80–5.10)
RDW: 12.1 % (ref 11.0–15.0)
Total Lymphocyte: 26.5 %
WBC: 6.6 10*3/uL (ref 3.8–10.8)

## 2021-04-07 LAB — COMPLETE METABOLIC PANEL WITH GFR
AG Ratio: 1.5 (calc) (ref 1.0–2.5)
ALT: 34 U/L — ABNORMAL HIGH (ref 6–29)
AST: 29 U/L (ref 10–35)
Albumin: 4.1 g/dL (ref 3.6–5.1)
Alkaline phosphatase (APISO): 85 U/L (ref 37–153)
BUN: 16 mg/dL (ref 7–25)
CO2: 29 mmol/L (ref 20–32)
Calcium: 10.3 mg/dL (ref 8.6–10.4)
Chloride: 103 mmol/L (ref 98–110)
Creat: 0.9 mg/dL (ref 0.50–1.05)
Globulin: 2.8 g/dL (calc) (ref 1.9–3.7)
Glucose, Bld: 87 mg/dL (ref 65–99)
Potassium: 5.1 mmol/L (ref 3.5–5.3)
Sodium: 142 mmol/L (ref 135–146)
Total Bilirubin: 0.4 mg/dL (ref 0.2–1.2)
Total Protein: 6.9 g/dL (ref 6.1–8.1)
eGFR: 71 mL/min/{1.73_m2} (ref >=60)

## 2021-04-07 LAB — HEMOGLOBIN A1C
Hgb A1c MFr Bld: 6.2 % of total Hgb — ABNORMAL HIGH (ref <5.7)
Mean Plasma Glucose: 131 mg/dL
eAG (mmol/L): 7.3 mmol/L

## 2021-04-07 LAB — LIPID PANEL
Cholesterol: 147 mg/dL (ref <200)
HDL: 80 mg/dL (ref >=50)
LDL Cholesterol (Calc): 44 mg/dL (calc)
Non-HDL Cholesterol (Calc): 67 mg/dL (calc) (ref <130)
Total CHOL/HDL Ratio: 1.8 (calc) (ref <5.0)
Triglycerides: 152 mg/dL — ABNORMAL HIGH (ref <150)

## 2021-04-07 LAB — VITAMIN D 25 HYDROXY (VIT D DEFICIENCY, FRACTURES): Vit D, 25-Hydroxy: 67 ng/mL (ref 30–100)

## 2021-04-07 LAB — MAGNESIUM: Magnesium: 2.2 mg/dL (ref 1.5–2.5)

## 2021-04-07 LAB — INSULIN, RANDOM: Insulin: 40.2 u[IU]/mL — ABNORMAL HIGH

## 2021-04-07 LAB — TSH: TSH: 0.74 mIU/L (ref 0.40–4.50)

## 2021-04-07 NOTE — Telephone Encounter (Signed)
Patient is called stating she needs to get a permission slip saying it's okay for her to get dental work.

## 2021-04-07 NOTE — Progress Notes (Signed)
=============================================================== °=============================================================== ° °-    Total Chol = 147   & LDL Chol = 44   - both    Excellent   - Very low risk for Heart Attack  / Stroke ============================================================ ============================================================  -  A1c = 6.2% -   is elevated in the borderline and  early or pre-diabetes range which has the same   300% increased risk for heart attack, stroke, cancer and   alzheimer- type vascular dementia as full blown diabetes.   But the good news is that diet, exercise with  weight loss can cure the early diabetes at this point. ============================================================ ============================================================  -  It is very important that you work harder with diet by  avoiding all foods that are white except chicken,   fish & calliflower.  - Avoid white rice  (brown & wild rice is OK),   - Avoid white potatoes  (sweet potatoes in moderation is OK),   White bread or wheat bread or anything made out of   white flour like bagels, donuts, rolls, buns, biscuits, cakes,  - pastries, cookies, pizza crust, and pasta (made from  white flour & egg whites)   - vegetarian pasta or spinach or wheat pasta is OK.  - Multigrain breads like Arnold's, Pepperidge Farm or   multigrain sandwich thins or high fiber breads like   Eureka bread or "Dave's Killer" breads that are  4 to 5 grams fiber per slice !  are best.    Diet, exercise and weight loss can reverse and cure  diabetes in the early stages.   ============================================================ ============================================================  - Vitamin D  = 67 - Excellent  - Please keep dose same   ============================================================ ============================================================  - All Else - CBC - Kidneys - Electrolytes - Liver - Magnesium & Thyroid    - all  Normal / OK ============================================================ ============================================================

## 2021-04-07 NOTE — Telephone Encounter (Signed)
Spoke with the pt who states that she has 10 teeth to be extracted. I advised the pt to call the dentist and have a clearance sent to the office including the number of teeth. Pt verbalized understanding and had no additional questions.

## 2021-04-13 ENCOUNTER — Telehealth: Payer: Self-pay | Admitting: Cardiology

## 2021-04-13 NOTE — Telephone Encounter (Signed)
Patient states she received lab orders in the mail but will be getting labs with her PCP in June or May. She would like to know if she can just have the labs done then.

## 2021-04-13 NOTE — Telephone Encounter (Signed)
Spoke with pt, aware will be fine to have lab work done with her medical doctor.

## 2021-04-14 ENCOUNTER — Telehealth: Payer: Self-pay | Admitting: *Deleted

## 2021-04-14 ENCOUNTER — Other Ambulatory Visit: Payer: Self-pay | Admitting: Adult Health Nurse Practitioner

## 2021-04-14 DIAGNOSIS — I10 Essential (primary) hypertension: Secondary | ICD-10-CM

## 2021-04-14 NOTE — Telephone Encounter (Signed)
° °  Pre-operative Risk Assessment    Patient Name: Amber Hickman  DOB: 1955/03/23 MRN: [id number]     Request for Surgical Clearance    Procedure:  Dental Extraction - Amount of Teeth to be Pulled:  surgical extraction  of #5 ,#14  Date of Surgery:  Clearance TBD                                 Surgeon:  Dr Geradine Girt DMD,PA Surgeon's Group or Practice Name:   [facility] Phone number:  [phone]  Fax number:  [phone]    Type of Clearance Requested:   - Medical  - Pharmacy:  Hold Aspirin     Type of Anesthesia:  Local  with epinephrine  With epinephrine  Additional requests/questions:  Please advise surgeon/provider what medications should be held.  Olin Pia   04/14/2021, 9:53 AM

## 2021-04-15 DIAGNOSIS — K648 Other hemorrhoids: Secondary | ICD-10-CM | POA: Diagnosis not present

## 2021-04-15 NOTE — Telephone Encounter (Signed)
? ?  Patient Name: Amber Hickman  ?DOB: June 01, 1955 ?MRN: [id number] ? ?Primary Cardiologist: Kirk Ruths, MD ? ?Chart reviewed as part of pre-operative protocol coverage.  ? ?IF SIMPLE EXTRACTION/CLEANINGS: Simple dental extractions are considered low risk procedures per guidelines and generally do not require any specific cardiac clearance. It is also generally accepted that for simple extractions and dental cleanings, there is no need to interrupt blood thinner therapy. ? ?The patient was recently seen in the office 03/20/2021, no need for further cardiac follow-up.  ? ?SBE prophylaxis is not required for the patient from a cardiac standpoint. ? ?I will route this recommendation to the requesting party via Epic fax function and remove from pre-op pool. ? ?Please call with questions. ? ?Elgie Collard, PA-C ?04/15/2021, 9:29 AM  ?

## 2021-04-19 NOTE — Progress Notes (Signed)
? ? ?Future Appointments  ?Date Time Provider Department  ?04/20/2021 11:30 AM Unk Pinto, MD [facility]  ?07/16/2021 11:00 AM Liane Comber, NP [facility]  ?10/30/2021 11:00 AM Unk Pinto, MD [facility]  ? ? ?History of Present Illness: ? ?    This very nice 66 y.o. single WF  with HTN, HLD, Pre-Diabetes and Vitamin D Deficiency who returns for 2 week f/u using Trelegy samples. Sh feels he cough & wheezing is improved but not gone.  She has an approx 40+ year smoking hx from about age 71 yo til alleging quitting about Mar 2019 ( age 51 yo). She denies and dyspnea at rest or with usual daily activities & denies any sputum production.  ? ?Medications ? ?  ?  ezetimibe  10 MG tablet, Take 1 tablet daily. ?  hydrochlorothiazide 25 MG tablet, Take 1 tablet daily for BP and fluid ?  olmesartan  20 MG tablet, TAKE 1 TABLET BY MOUTH DAILY ?  rosuvastatin 40 MG tablet, Take 1 tab daily for cholesterol. ?  aspirin EC 81 MG tablet, Take 1 tablet daily ? ?  oxyCODONE 5 MG IR  tablet, Take 15 mg  every 6 hours as needed for pain.  ? ?  baclofen 20 MG tablet, Take 3 ( times daily as needed for muscle spasms. ?  Vitamin D 5,000 units TABS, Take 2 tablets daily ?  DULoxetine 60 MG capsule, Take  1 capsule  Daily  for Chronic Pain ?  gabapentin 800 MG tablet, Take  1 table t 3 x /day   ?  ketotifen (ZADITOR) 0.025 % ophth soln, Place 1 drop into both eyes 3 times daily . ? ?Problem list ?She has Hyperlipidemia, mixed; Vitamin D deficiency; COPD (chronic obstructive pulmonary disease) ([place]); Medication management; CKD (chronic kidney disease) stage 2, GFR 60-89 ml/min; DDD, lumbar; Chronic pain syndrome; Essential hypertension; Depression, major, recurrent, in partial remission ([place]); OSA and COPD overlap syndrome ([place]); Aortic atherosclerosis ([place]) by Chest  CT scan 10/02/2019; Abnormal glucose; Former smoker (30 pack/year, quit 2019); Overweight (BMI 25.0-29.9); History of colon polyps; Osteoporosis; and Coronary  atherosclerosis of native coronary artery on their problem list. ?  ?Observations/Objective: ? ?BP (!) 148/70   Pulse 85   Temp 97.9 ?F (36.6 ?C)   Resp 16   Ht 5' (1.524 m)   Wt 166 lb (75.3 kg)   SpO2 96%   BMI 32.42 kg/m?  ? ?HEENT - WNL. ?Neck - supple.  ?Chest - Bilat post tussive forced trans-expiratory wheezes . No rales or rhonchi.  ?Cor - Nl HS. RRR w/o sig MGR. PP 1(+). No edema. ?MS- FROM w/o deformities.  Gait Nl. ?Neuro -  Nl w/o focal abnormalities. ? ? ?Assessment and Plan: ? ? ?1. Asthma in adult, mild persistent, with acute exacerbation ? ?- fluticasone-salmeterol (ADVAIR) 500-50;  ?Use 1 inhalation 2 x /day ( every 12 hours ) for COPD /Asthma  ?Dispense: 180 each; Refill: 3 ? ?- discussed using generic Advair for cost saving &  ?discussed use of Discus inhaler & also encouraged review on Youtube. ? ?Follow Up Instructions: ? ?  ?    I discussed the assessment and treatment plan with the patient. The patient was provided an opportunity to ask questions and all were answered. The patient agreed with the plan and demonstrated an understanding of the instructions.   ?  ?    Discussed return or ER precautions. The patient was advised to call back or seek an in-person evaluation  if the symptoms worsen or if the condition fails to improve as anticipated. ? ? ?Kirtland Bouchard, MD ? ? ?

## 2021-04-20 ENCOUNTER — Other Ambulatory Visit: Payer: Self-pay

## 2021-04-20 ENCOUNTER — Encounter: Payer: Self-pay | Admitting: Internal Medicine

## 2021-04-20 ENCOUNTER — Ambulatory Visit (INDEPENDENT_AMBULATORY_CARE_PROVIDER_SITE_OTHER): Payer: Medicare Other | Admitting: Internal Medicine

## 2021-04-20 VITALS — BP 148/70 | HR 85 | Temp 97.9°F | Resp 16 | Ht 60.0 in | Wt 166.0 lb

## 2021-04-20 DIAGNOSIS — J4531 Mild persistent asthma with (acute) exacerbation: Secondary | ICD-10-CM | POA: Diagnosis not present

## 2021-04-20 MED ORDER — FLUTICASONE-SALMETEROL 500-50 MCG/ACT IN AEPB: INHALATION_SPRAY | RESPIRATORY_TRACT | 3 refills | Status: DC

## 2021-04-28 DIAGNOSIS — G894 Chronic pain syndrome: Secondary | ICD-10-CM | POA: Diagnosis not present

## 2021-04-28 DIAGNOSIS — M542 Cervicalgia: Secondary | ICD-10-CM | POA: Diagnosis not present

## 2021-04-28 DIAGNOSIS — M5106 Intervertebral disc disorders with myelopathy, lumbar region: Secondary | ICD-10-CM | POA: Diagnosis not present

## 2021-04-28 DIAGNOSIS — M4326 Fusion of spine, lumbar region: Secondary | ICD-10-CM | POA: Diagnosis not present

## 2021-04-29 DIAGNOSIS — K648 Other hemorrhoids: Secondary | ICD-10-CM | POA: Diagnosis not present

## 2021-05-20 DIAGNOSIS — K648 Other hemorrhoids: Secondary | ICD-10-CM | POA: Diagnosis not present

## 2021-06-02 DIAGNOSIS — M5106 Intervertebral disc disorders with myelopathy, lumbar region: Secondary | ICD-10-CM | POA: Diagnosis not present

## 2021-06-02 DIAGNOSIS — G894 Chronic pain syndrome: Secondary | ICD-10-CM | POA: Diagnosis not present

## 2021-06-02 DIAGNOSIS — M4326 Fusion of spine, lumbar region: Secondary | ICD-10-CM | POA: Diagnosis not present

## 2021-06-30 DIAGNOSIS — M545 Low back pain, unspecified: Secondary | ICD-10-CM | POA: Diagnosis not present

## 2021-06-30 DIAGNOSIS — G894 Chronic pain syndrome: Secondary | ICD-10-CM | POA: Diagnosis not present

## 2021-06-30 DIAGNOSIS — I10 Essential (primary) hypertension: Secondary | ICD-10-CM | POA: Diagnosis not present

## 2021-07-14 ENCOUNTER — Telehealth: Payer: Self-pay | Admitting: Internal Medicine

## 2021-07-14 NOTE — Chronic Care Management (AMB) (Signed)
  Chronic Care Management   Note  07/14/2021 Name: Amber Hickman MRN: [id number] DOB: 07/07/1955  Amber Hickman is a 66 y.o. year old female who is a primary care patient of Amber Pinto, MD. I reached out to Amber Hickman by phone today in response to a referral sent by Amber Hickman's PCP, Amber Pinto, MD.   Amber Hickman was given information about Chronic Care Management services today including:  CCM service includes personalized support from designated clinical staff supervised by her physician, including individualized plan of care and coordination with other care providers 24/7 contact phone numbers for assistance for urgent and routine care needs. Service will only be billed when office clinical staff spend 20 minutes or more in a month to coordinate care. Only one practitioner may furnish and bill the service in a calendar month. The patient may stop CCM services at any time (effective at the end of the month) by phone call to the office staff.   Patient did not agree to enrollment in care management services and does not wish to consider at this time.  Follow up plan:   Amber Hickman

## 2021-07-16 ENCOUNTER — Encounter: Payer: Self-pay | Admitting: Adult Health

## 2021-07-16 ENCOUNTER — Ambulatory Visit (INDEPENDENT_AMBULATORY_CARE_PROVIDER_SITE_OTHER): Payer: Medicare Other | Admitting: Adult Health

## 2021-07-16 VITALS — BP 144/82 | HR 81 | Temp 97.9°F | Wt 162.6 lb

## 2021-07-16 DIAGNOSIS — E559 Vitamin D deficiency, unspecified: Secondary | ICD-10-CM

## 2021-07-16 DIAGNOSIS — M8700 Idiopathic aseptic necrosis of unspecified bone: Secondary | ICD-10-CM | POA: Diagnosis not present

## 2021-07-16 DIAGNOSIS — G4733 Obstructive sleep apnea (adult) (pediatric): Secondary | ICD-10-CM | POA: Diagnosis not present

## 2021-07-16 DIAGNOSIS — I10 Essential (primary) hypertension: Secondary | ICD-10-CM

## 2021-07-16 DIAGNOSIS — R7309 Other abnormal glucose: Secondary | ICD-10-CM

## 2021-07-16 DIAGNOSIS — Z79899 Other long term (current) drug therapy: Secondary | ICD-10-CM

## 2021-07-16 DIAGNOSIS — E782 Mixed hyperlipidemia: Secondary | ICD-10-CM

## 2021-07-16 DIAGNOSIS — N182 Chronic kidney disease, stage 2 (mild): Secondary | ICD-10-CM | POA: Diagnosis not present

## 2021-07-16 DIAGNOSIS — Z87891 Personal history of nicotine dependence: Secondary | ICD-10-CM

## 2021-07-16 DIAGNOSIS — J449 Chronic obstructive pulmonary disease, unspecified: Secondary | ICD-10-CM

## 2021-07-16 DIAGNOSIS — Z0001 Encounter for general adult medical examination with abnormal findings: Secondary | ICD-10-CM

## 2021-07-16 DIAGNOSIS — I2510 Atherosclerotic heart disease of native coronary artery without angina pectoris: Secondary | ICD-10-CM

## 2021-07-16 DIAGNOSIS — M810 Age-related osteoporosis without current pathological fracture: Secondary | ICD-10-CM | POA: Diagnosis not present

## 2021-07-16 DIAGNOSIS — Z0000 Encounter for general adult medical examination without abnormal findings: Secondary | ICD-10-CM

## 2021-07-16 DIAGNOSIS — F3341 Major depressive disorder, recurrent, in partial remission: Secondary | ICD-10-CM

## 2021-07-16 DIAGNOSIS — R6889 Other general symptoms and signs: Secondary | ICD-10-CM

## 2021-07-16 DIAGNOSIS — I700 Atherosclerosis of aorta: Secondary | ICD-10-CM

## 2021-07-16 DIAGNOSIS — M5136 Other intervertebral disc degeneration, lumbar region: Secondary | ICD-10-CM

## 2021-07-16 DIAGNOSIS — E669 Obesity, unspecified: Secondary | ICD-10-CM

## 2021-07-16 DIAGNOSIS — Z86010 Personal history of colonic polyps: Secondary | ICD-10-CM

## 2021-07-16 DIAGNOSIS — G894 Chronic pain syndrome: Secondary | ICD-10-CM

## 2021-07-16 MED ORDER — ALENDRONATE SODIUM 70 MG PO TABS: 70.0000 mg | ORAL_TABLET | ORAL | 3 refills | Status: DC

## 2021-07-16 NOTE — Patient Instructions (Addendum)
Ms. Bound , Thank you for taking time to come for your Medicare Wellness Visit. I appreciate your ongoing commitment to your health goals. Please review the following plan we discussed and let me know if I can assist you in the future.   These are the goals we discussed:  Goals      Blood Pressure < 130/80     LDL CALC < 55     Weight (lb) < 145 lb (65.8 kg)        This is a list of the screening recommended for you and due dates:  Health Maintenance  Topic Date Due   COVID-19 Vaccine (3 - Pfizer risk series) 08/01/2021*   Zoster (Shingles) Vaccine (1 of 2) 10/16/2021*   Pneumonia Vaccine (1 - PCV) 07/17/2022*   Mammogram  02/14/2023   Tetanus Vaccine  06/04/2025   Colon Cancer Screening  01/06/2026   DEXA scan (bone density measurement)  Completed   Hepatitis C Screening: USPSTF Recommendation to screen - Ages 67-79 yo.  Completed   HPV Vaccine  Aged Out   Flu Shot  Discontinued  *Topic was postponed. The date shown is not the original due date.     Recommend cutting down on sugar intake  - the American Heart Association recommends no more than 9 teaspoons (38 g) of added sugar for men daily, and 6 teaspoons (25 g) for women. Added sugar can be in many things that you might not expect - salad dressings, bread that is not home made, "all natural" fruit juice, etc., most processed foods contain hidden sugars. Consider looking at labels and being aware of how much sugar you are consuming in a day. Less is always better for sugar; sugar reduces your body's immune response, and damages blood vessels, leading to increased risk of many diseases.

## 2021-07-16 NOTE — Progress Notes (Signed)
MEDICARE ANNUAL WELLNESS VISIT AND 3 FOLLOW UP Assessment:    Annual Medicare Wellness Visit Due annually  Health maintenance reviewed - declines all vaccines today - otherwise UTD  Coronary atherosclerosis of native coronary artery Control blood pressure, cholesterol (LDL <70), glucose, increase exercise. Cardiology follows, continue statin, ASA.   Essential hypertension - labile, has been holding BP meds intermittently - restart taking regularly, if having low BPs can hold HCTZ only - if low taking olmesartan 20 mg daily contact office  - DASH diet, exercise and monitor at home. Call if greater than 130/80.  -     CBC with Differential/Platelet -     CMP/GFR -     TSH  Atherosclerosis of aorta (HCC) - CT 04/02/2021 Control blood pressure, lipids and glucose Disscused lifestyle modifications, diet & exercise Continue to monitor  Chronic obstructive pulmonary disease, unspecified COPD type ([place]) Per numerous CTs, no longer smoking; denies sx, No medications at this time   OSA and COPD overlap syndrome (HCC) Has CPAP - Discussed using regularly Weight loss encouraged  CKD (chronic kidney disease) stage 2, GFR 60-89 ml/min Increase fluids, avoid NSAIDS, monitor sugars, will monitor -     CMP/GFR  Depression, major, recurrent, in partial remission ([place]) Doing well on cymbalta for mood and pain benefit Discussed stress management techniques  Discussed, increase water,intake & good sleep hygiene  Discussed increasing exercise & vegetables in diet  Chronic pain syndrome DDD, lumbar Continue ortho/pain management follow up, Dr. Patrice Paradise  Former Tobacco use disorder Still not smoking Encouraged healthy behaviors - low dose CT lung cancer screening UTD, next due 01/2022  Other abnormal glucose (prediabetes) Discussed disease and risks Discussed diet/exercise, weight management  Check A1C q52m CMP for glucose otherwise  Mixed hyperlipidemia Continue medications:  Simvastatin '40mg'$  nightly Discussed dietary and exercise modifications Low fat diet  Vitamin D deficiency Continue supplementation Taking Vitamin D 5,000 IU daily At goal last check - defer today   Medication management Continued  History of TIA Continue follow up with Dr. WJannifer Franklin Obesity - BMI 31 Discussed dietary and exercise modifications Weight loss encouraged Encouraged to reduce sugary snacks  History of colon polyps Colonoscopy UTD; Dr. MEarlean Shawl ? 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 Ortho follows;   Age related osteoporosis On alendronate since 2022 Continue Vit D and Ca, weight bearing exercises Repeat DEXA in 2 years   Orders Placed This Encounter  Procedures   CBC with Differential/Platelet   COMPLETE METABOLIC PANEL WITH GFR   Magnesium   Lipid panel   TSH   Over 40 minutes of face to face interview, exam, counseling, chart review, and critical decision making was performed  Future Appointments  Date Time Provider DTrigg 10/30/2021 11:00 AM MUnk Pinto MD [facility] None  07/19/2022 10:00 AM CDarrol Jump NP [facility] None    Plan:   During the course of the visit the patient was educated and counseled about appropriate screening and preventive services including:   Pneumococcal vaccine  Influenza vaccine Prevnar 13 Td vaccine Screening electrocardiogram Colorectal cancer screening Diabetes screening Glaucoma screening Nutrition counseling    Subjective:  TLashauna Arpinis a 66y.o. female who presents for Medicare Annual Wellness Visit and 3 month follow up. She has Hyperlipidemia, mixed; Vitamin D deficiency; COPD (chronic obstructive pulmonary disease) ([place]; Medication management; CKD (chronic kidney disease) stage 2, GFR 60-89 ml/min; DDD, lumbar; Chronic  pain syndrome; Essential hypertension; Depression, major, recurrent, in partial remission ([place]);  OSA and COPD overlap syndrome ([place]); Aortic atherosclerosis ([place]) by Chest CT scan - 04/02/2021; Abnormal glucose; Former smoker (30 pack/year, quit 2019); Overweight (BMI 25.0-29.9); History of colon polyps; Osteoporosis; and Coronary atherosclerosis of native coronary artery on their problem list.  She has OSA and is not on CPAP due to intolerance, sleeps well when weight is down, declined referral back to sleep medicine.   She is a former smoker, quit in 2019 with 30 pack year hx.  Low dose CT lung screening 01/26/2021 was benign but showed emphysematous changes and aortic atherosclerosis.   Fused L3 down, follows Dr Patrice Paradise. She sees pain management for her back pain, Dr. Marnee Guarneri, gets injections.  They are prescribing her pain medications (oxycodone 15 mg q6h PRN), takes 1-4/day, recently pain is worse. Has residual RLE weakness/foot drop and calf wasting. Does have some gait instability if she rushes, reports benefited from PT last year by neuro, plans to repeat. Also has known AVN bil hips per MRI 2018, patient also reports in shoulders per ortho XR.   She has hx of MDD in remission on cymbalta 60 mg, also for pain benefit.   BMI is Body mass index is 31.76 kg/m., she is working on diet, exercise limited by back pain.  Wt Readings from Last 3 Encounters:  07/16/21 162 lb 9.6 oz (73.8 kg)  04/20/21 166 lb (75.3 kg)  04/06/21 168 lb 6.4 oz (76.4 kg)   She had a normal echo 04/2015, normal stress test 04/2015. Aortic atherosclerosis per CT 09/2019. She had CT coronary scan by Dr. Stanford Breed, total calcium score 126, LAD 113, RCA 12, 82 % for age/gender, mild non-obstructive CAD.   Her blood pressure has been labile, ranging 110s/60s to 150/90s, today their BP is BP: (!) 144/82, similar by manual recheck by provider She reports intermitently will hold olmesartan/HCTZ as has noted will dizzy and low BP after taking muscle relaxer -   BP Readings from Last 3 Encounters:  07/16/21 (!) 144/82   04/20/21 (!) 148/70  04/06/21 120/70    She does not workout. She denies chest pain, shortness of breath, dizziness.   She is on cholesterol medication (rosuvastatin 40 mg and more recently on zetia 10 mg by cardiology) and denies myalgias. Her cholesterol is at goal <70. The cholesterol last visit was:   Lab Results  Component Value Date   CHOL 147 04/06/2021   HDL 80 04/06/2021   LDLCALC 44 04/06/2021   TRIG 152 (H) 04/06/2021   CHOLHDL 1.8 04/06/2021    She has been working on diet and exercise for prediabetes, and denies increased appetite, nausea, paresthesia of the feet, polydipsia, polyuria and visual disturbances. Last A1C in the office was:  Lab Results  Component Value Date   HGBA1C 6.2 (H) 04/06/2021   Patient is on Vitamin D supplement.   Lab Results  Component Value Date   VD25OH 67 04/06/2021       Medication Review: Current Outpatient Medications on File Prior to Visit  Medication Sig Dispense Refill   aspirin EC 81 MG tablet Take 1 tablet (81 mg total) by mouth daily. Swallow whole. 90 tablet 3   cholecalciferol 5000 units TABS Take 2 tablets daily     DULoxetine (CYMBALTA) 60 MG capsule Take  1 capsule  Daily  for Chronic Pain 90 capsule 3   ezetimibe (ZETIA) 10 MG tablet Take 1 tablet (10 mg total) by mouth daily.  90 tablet 3   fluticasone-salmeterol (ADVAIR) 500-50 MCG/ACT AEPB Use 1 inhalation 2 x /day ( every 12 hours ) for COPD /Asthma                           /                    Inhale              into Lungs 180 each 3   gabapentin (NEURONTIN) 800 MG tablet Take  1 table t 3 x /day  for Chronic Pain                                                                    /                              TAKE  BY MOUTH 270 tablet 3   hydrochlorothiazide (HYDRODIURIL) 25 MG tablet Take 1 tablet daily for BP and fluid 90 tablet 3   ketotifen (ZADITOR) 0.025 % ophthalmic solution Place 1 drop into both eyes 3 (three) times daily as needed (allergies).      olmesartan (BENICAR) 20 MG tablet TAKE 1 TABLET BY MOUTH DAILY 30 tablet 11   oxyCODONE (ROXICODONE) 15 MG immediate release tablet Take 15 mg by mouth every 6 (six) hours as needed for pain.   0   rosuvastatin (CRESTOR) 40 MG tablet Take 1 tab daily for cholesterol. 90 tablet 3   baclofen (LIORESAL) 20 MG tablet Take 20 mg by mouth 3 (three) times daily as needed for muscle spasms. (Patient not taking: Reported on 07/16/2021)     No current facility-administered medications on file prior to visit.    Allergies: Allergies  Allergen Reactions   Augmentin [Amoxicillin-Pot Clavulanate] Nausea And Vomiting   Codeine Nausea And Vomiting   Zoloft [Sertraline Hcl]     Pt does not recall reaction    Lipitor [Atorvastatin] Other (See Comments)    Leg pain    Current Problems (verified) has Hyperlipidemia, mixed; Vitamin D deficiency; COPD (chronic obstructive pulmonary disease) ([place]); Medication management; CKD (chronic kidney disease) stage 2, GFR 60-89 ml/min; DDD, lumbar; Chronic pain syndrome; Essential hypertension; Depression, major, recurrent, in partial remission ([place]); OSA and COPD overlap syndrome ([place]); Aortic atherosclerosis ([place]) by Chest CT scan - 04/02/2021; Abnormal glucose; Former smoker (30 pack/year, quit 2019); Overweight (BMI 25.0-29.9); History of colon polyps; Osteoporosis; and Coronary atherosclerosis of native coronary artery on their problem list.  Screening Tests Immunization History  Administered Date(s) Administered   PFIZER(Purple Top)SARS-COV-2 Vaccination 07/02/2019, 07/24/2019   Td 01/19/2004, 06/05/2015   Health Maintenance  Topic Date Due   COVID-19 Vaccine (3 - Pfizer risk series) 08/01/2021 (Originally 08/21/2019)   Zoster Vaccines- Shingrix (1 of 2) 10/16/2021 (Originally [birth year]/1974)   Pneumonia Vaccine 68+ Years old (1 - PCV) 07/17/2022 (Originally 02/13/2020)   MAMMOGRAM  02/14/2023   TETANUS/TDAP  06/04/2025   COLONOSCOPY (Pts 45-40yr Insurance coverage  will need to be confirmed)  01/06/2026   DEXA SCAN  Completed   Hepatitis C Screening  Completed   HPV VACCINES  Aged Out   INFLUENZA VACCINE  Discontinued   Last colonoscopy: 09/2017, tubular adenoma, Dr. Earlean Shawl, ? 5 year recall vs 3 year recall due to family hx. She had clear colonoscopy 01/06/2021.  PAP 2011, was advised none further Mammogram 01/2021 DEXA - 07/2020 T -2.8 L forearm, started on alendronate   Influenza: Declined  Pneumococcal: Due, but declined Prevnar13: Declined Shingles/Zostavax: Declined COVID-19: 2/2, 2021, pfizer   Names of Other Physician/Practitioners you currently use: 1. [facility] here for primary care 2. Dr. Delman Cheadle, eye doctor, last visit 02/2021 3. Dr.Saraf , dentist, last visit 06/2021  Patient Care Team: Unk Pinto, MD as PCP - General (Internal Medicine) Stanford Breed Denice Bors, MD as PCP - Cardiology (Cardiology) Richmond Campbell, MD as Consulting Physician (Gastroenterology) Delfina Redwood as Referring Physician (Physician Assistant) Mammography, [facility] (Diagnostic Radiology)  Surgical: She  has a past surgical history that includes Cataract extraction (Bilateral); Spine surgery; Ankle surgery (Right); Tonsillectomy and adenoidectomy; Back surgery; and Breast excisional biopsy (Right). Family Her family history includes COPD in her mother; Cancer in her mother; Coronary artery disease in her paternal uncle; Heart disease in her father; Hypertension in her mother; Stroke in her mother. Social history  She reports that she quit smoking about 4 years ago. Her smoking use included cigarettes. She has a 30.00 pack-year smoking history. She has never used smokeless tobacco. She reports that she does not drink alcohol and does not use drugs.  MEDICARE WELLNESS OBJECTIVES: Physical activity: Current Exercise Habits: Home exercise routine, Type of exercise: walking, Time (Minutes): 10, Frequency (Times/Week):  7, Weekly Exercise (Minutes/Week): 70, Intensity: Mild, Exercise limited by: orthopedic condition(s) Cardiac risk factors: Cardiac Risk Factors include: advanced age (>36mn, >>12women);dyslipidemia;hypertension;sedentary lifestyle;smoking/ tobacco exposure;obesity (BMI >30kg/m2) Depression/mood screen:      07/16/2021   11:36 AM  Depression screen PHQ 2/9  Decreased Interest 0  Down, Depressed, Hopeless 1  PHQ - 2 Score 1    ADLs:     07/16/2021   11:27 AM 04/20/2021    9:08 PM  In your present state of health, do you have any difficulty performing the following activities:  Hearing? 0 0  Vision? 0 0  Difficulty concentrating or making decisions? 0 0  Walking or climbing stairs? 1 0  Comment chronic pain, back problems, can manage if goes slowly   Dressing or bathing? 0 0  Doing errands, shopping? 0 0     Cognitive Testing  Alert? Yes  Normal Appearance?Yes  Oriented to person? Yes  Place? Yes   Time? Yes  Recall of three objects?  Yes  Can perform simple calculations? Yes  Displays appropriate judgment?Yes  Can read the correct time from a watch face?Yes  EOL planning: Does Patient Have a Medical Advance Directive?: Yes Type of Advance Directive: Living will Does patient want to make changes to medical advance directive?: No - Patient declined   Objective:   Today's Vitals   07/16/21 1056  BP: (!) 144/82  Pulse: 81  Temp: 97.9 F (36.6 C)  SpO2: 96%  Weight: 162 lb 9.6 oz (73.8 kg)   Body mass index is 31.76 kg/m.  General appearance: alert, no distress, WD/WN, female HEENT: normocephalic, sclerae anicteric, TMs pearly, nares patent, no discharge or erythema, pharynx normal Oral cavity: MMM, no lesions Neck: supple, no lymphadenopathy, no thyromegaly, no masses Heart: RRR, normal S1, S2, no murmurs Lungs: CTA bilaterally, no wheezes, rhonchi, or rales Abdomen: +bs, soft, non tender, non distended, no masses, no hepatomegaly, no splenomegaly Musculoskeletal:  nontender, no swelling, does have obvious atrophy to calf.  Tenderness to bilateral lumbar, no CVA tenderness noted.  Extremities: no edema, no cyanosis, no clubbing, right leg with some atrophy and decrease muscle compared to left.  Pulses: 2+ symmetric, upper extremities, RLE thready pulses and cold lower leg, does blanch/mildly slow cap refill. No wounds. Neurological: alert, oriented x 3, CN2-12 intact, strength normal upper extremities Weakness with ankle extension R.  Psychiatric: normal affect, behavior normal, pleasant   Medicare Attestation I have personally reviewed: The patient's medical and social history Their use of alcohol, tobacco or illicit drugs Their current medications and supplements The patient's functional ability including ADLs,fall risks, home safety risks, cognitive, and hearing and visual impairment Diet and physical activities Evidence for depression or mood disorders  The patient's weight, height, BMI, and visual acuity have been recorded in the chart.  I have made referrals, counseling, and provided education to the patient based on review of the above and I have provided the patient with a written personalized care plan for preventive services.     Amber Ribas, NP   07/16/2021

## 2021-07-17 ENCOUNTER — Other Ambulatory Visit: Payer: Self-pay | Admitting: Adult Health

## 2021-07-17 ENCOUNTER — Encounter: Payer: Self-pay | Admitting: Adult Health

## 2021-07-17 DIAGNOSIS — R7989 Other specified abnormal findings of blood chemistry: Secondary | ICD-10-CM | POA: Insufficient documentation

## 2021-07-17 LAB — LIPID PANEL
Cholesterol: 129 mg/dL (ref <200)
HDL: 67 mg/dL (ref >=50)
LDL Cholesterol (Calc): 41 mg/dL (calc)
Non-HDL Cholesterol (Calc): 62 mg/dL (calc) (ref <130)
Total CHOL/HDL Ratio: 1.9 (calc) (ref <5.0)
Triglycerides: 119 mg/dL (ref <150)

## 2021-07-17 LAB — COMPLETE METABOLIC PANEL WITH GFR
AG Ratio: 1.7 (calc) (ref 1.0–2.5)
ALT: 45 U/L — ABNORMAL HIGH (ref 6–29)
AST: 31 U/L (ref 10–35)
Albumin: 4.8 g/dL (ref 3.6–5.1)
Alkaline phosphatase (APISO): 93 U/L (ref 37–153)
BUN: 14 mg/dL (ref 7–25)
CO2: 27 mmol/L (ref 20–32)
Calcium: 11.5 mg/dL — ABNORMAL HIGH (ref 8.6–10.4)
Chloride: 99 mmol/L (ref 98–110)
Creat: 0.85 mg/dL (ref 0.50–1.05)
Globulin: 2.9 g/dL (calc) (ref 1.9–3.7)
Glucose, Bld: 101 mg/dL — ABNORMAL HIGH (ref 65–99)
Potassium: 3.8 mmol/L (ref 3.5–5.3)
Sodium: 138 mmol/L (ref 135–146)
Total Bilirubin: 0.5 mg/dL (ref 0.2–1.2)
Total Protein: 7.7 g/dL (ref 6.1–8.1)
eGFR: 76 mL/min/{1.73_m2} (ref >=60)

## 2021-07-17 LAB — MAGNESIUM: Magnesium: 2.1 mg/dL (ref 1.5–2.5)

## 2021-07-17 LAB — CBC WITH DIFFERENTIAL/PLATELET
Absolute Monocytes: 506 cells/uL (ref 200–950)
Basophils Absolute: 33 cells/uL (ref 0–200)
Basophils Relative: 0.3 %
Eosinophils Absolute: 220 cells/uL (ref 15–500)
Eosinophils Relative: 2 %
HCT: 40.9 % (ref 35.0–45.0)
Hemoglobin: 14.7 g/dL (ref 11.7–15.5)
Lymphs Abs: 1947 cells/uL (ref 850–3900)
MCH: 32.5 pg (ref 27.0–33.0)
MCHC: 35.9 g/dL (ref 32.0–36.0)
MCV: 90.5 fL (ref 80.0–100.0)
MPV: 10.8 fL (ref 7.5–12.5)
Monocytes Relative: 4.6 %
Neutro Abs: 8294 cells/uL — ABNORMAL HIGH (ref 1500–7800)
Neutrophils Relative %: 75.4 %
Platelets: 260 10*3/uL (ref 140–400)
RBC: 4.52 10*6/uL (ref 3.80–5.10)
RDW: 13.1 % (ref 11.0–15.0)
Total Lymphocyte: 17.7 %
WBC: 11 10*3/uL — ABNORMAL HIGH (ref 3.8–10.8)

## 2021-07-17 LAB — TSH: TSH: 2.7 mIU/L (ref 0.40–4.50)

## 2021-07-20 ENCOUNTER — Telehealth: Payer: Self-pay | Admitting: Adult Health

## 2021-07-20 NOTE — Telephone Encounter (Signed)
Pt is requesting a refill on topamax 50 mg, said she discussed with Caryl Pina about restarting this medicine. Wanting it send to [facility] on file

## 2021-07-21 ENCOUNTER — Other Ambulatory Visit: Payer: Self-pay | Admitting: Adult Health

## 2021-07-21 MED ORDER — TOPIRAMATE 25 MG PO TABS: ORAL_TABLET | ORAL | 0 refills | Status: DC

## 2021-07-22 ENCOUNTER — Ambulatory Visit
Admission: RE | Admit: 2021-07-22 | Discharge: 2021-07-22 | Disposition: A | Discharge status: Home / Self Care | Payer: Medicare Other | Source: Ambulatory Visit | Attending: Adult Health | Admitting: Adult Health

## 2021-07-22 DIAGNOSIS — R7989 Other specified abnormal findings of blood chemistry: Secondary | ICD-10-CM | POA: Diagnosis not present

## 2021-07-30 DIAGNOSIS — M545 Low back pain, unspecified: Secondary | ICD-10-CM | POA: Diagnosis not present

## 2021-07-30 DIAGNOSIS — G894 Chronic pain syndrome: Secondary | ICD-10-CM | POA: Diagnosis not present

## 2021-08-05 ENCOUNTER — Emergency Department (HOSPITAL_COMMUNITY): Payer: Medicare Other

## 2021-08-05 ENCOUNTER — Emergency Department (HOSPITAL_COMMUNITY)
Admission: EM | Admit: 2021-08-05 | Discharge: 2021-08-05 | Disposition: A | Discharge status: Home / Self Care | Payer: Medicare Other | Attending: Emergency Medicine | Admitting: Emergency Medicine

## 2021-08-05 DIAGNOSIS — D649 Anemia, unspecified: Secondary | ICD-10-CM | POA: Insufficient documentation

## 2021-08-05 DIAGNOSIS — S8265XA Nondisplaced fracture of lateral malleolus of left fibula, initial encounter for closed fracture: Secondary | ICD-10-CM | POA: Diagnosis not present

## 2021-08-05 DIAGNOSIS — Y92810 Car as the place of occurrence of the external cause: Secondary | ICD-10-CM | POA: Diagnosis not present

## 2021-08-05 DIAGNOSIS — S82435A Nondisplaced oblique fracture of shaft of left fibula, initial encounter for closed fracture: Secondary | ICD-10-CM | POA: Diagnosis not present

## 2021-08-05 DIAGNOSIS — R410 Disorientation, unspecified: Secondary | ICD-10-CM | POA: Diagnosis not present

## 2021-08-05 DIAGNOSIS — Z7982 Long term (current) use of aspirin: Secondary | ICD-10-CM | POA: Diagnosis not present

## 2021-08-05 DIAGNOSIS — W19XXXA Unspecified fall, initial encounter: Secondary | ICD-10-CM | POA: Diagnosis not present

## 2021-08-05 DIAGNOSIS — G20 Parkinson's disease: Secondary | ICD-10-CM | POA: Insufficient documentation

## 2021-08-05 DIAGNOSIS — M25572 Pain in left ankle and joints of left foot: Secondary | ICD-10-CM | POA: Diagnosis not present

## 2021-08-05 DIAGNOSIS — I959 Hypotension, unspecified: Secondary | ICD-10-CM | POA: Diagnosis not present

## 2021-08-05 DIAGNOSIS — M7989 Other specified soft tissue disorders: Secondary | ICD-10-CM | POA: Diagnosis not present

## 2021-08-05 DIAGNOSIS — R799 Abnormal finding of blood chemistry, unspecified: Secondary | ICD-10-CM | POA: Insufficient documentation

## 2021-08-05 DIAGNOSIS — S82832A Other fracture of upper and lower end of left fibula, initial encounter for closed fracture: Secondary | ICD-10-CM | POA: Diagnosis not present

## 2021-08-05 DIAGNOSIS — S99912A Unspecified injury of left ankle, initial encounter: Secondary | ICD-10-CM | POA: Diagnosis present

## 2021-08-05 DIAGNOSIS — R6889 Other general symptoms and signs: Secondary | ICD-10-CM | POA: Diagnosis not present

## 2021-08-05 DIAGNOSIS — R55 Syncope and collapse: Secondary | ICD-10-CM | POA: Diagnosis not present

## 2021-08-05 DIAGNOSIS — X501XXA Overexertion from prolonged static or awkward postures, initial encounter: Secondary | ICD-10-CM | POA: Insufficient documentation

## 2021-08-05 DIAGNOSIS — R0902 Hypoxemia: Secondary | ICD-10-CM | POA: Diagnosis not present

## 2021-08-05 LAB — URINALYSIS, ROUTINE W REFLEX MICROSCOPIC
Bacteria, UA: NONE SEEN
Bilirubin Urine: NEGATIVE
Glucose, UA: NEGATIVE mg/dL
Ketones, ur: NEGATIVE mg/dL
Nitrite: NEGATIVE
Protein, ur: NEGATIVE mg/dL
Specific Gravity, Urine: 1.003 — ABNORMAL LOW (ref 1.005–1.030)
pH: 6 (ref 5.0–8.0)

## 2021-08-05 LAB — CBC WITH DIFFERENTIAL/PLATELET
Abs Immature Granulocytes: 0.04 10*3/uL (ref 0.00–0.07)
Basophils Absolute: 0 10*3/uL (ref 0.0–0.1)
Basophils Relative: 0 %
Eosinophils Absolute: 0.2 10*3/uL (ref 0.0–0.5)
Eosinophils Relative: 2 %
HCT: 32.9 % — ABNORMAL LOW (ref 36.0–46.0)
Hemoglobin: 10.5 g/dL — ABNORMAL LOW (ref 12.0–15.0)
Immature Granulocytes: 0 %
Lymphocytes Relative: 16 %
Lymphs Abs: 1.7 10*3/uL (ref 0.7–4.0)
MCH: 29.5 pg (ref 26.0–34.0)
MCHC: 31.9 g/dL (ref 30.0–36.0)
MCV: 92.4 fL (ref 80.0–100.0)
Monocytes Absolute: 0.6 10*3/uL (ref 0.1–1.0)
Monocytes Relative: 5 %
Neutro Abs: 8.1 10*3/uL — ABNORMAL HIGH (ref 1.7–7.7)
Neutrophils Relative %: 77 %
Platelets: 178 10*3/uL (ref 150–400)
RBC: 3.56 MIL/uL — ABNORMAL LOW (ref 3.87–5.11)
RDW: 13.9 % (ref 11.5–15.5)
WBC: 10.5 10*3/uL (ref 4.0–10.5)
nRBC: 0 % (ref 0.0–0.2)

## 2021-08-05 LAB — COMPREHENSIVE METABOLIC PANEL
ALT: 27 U/L (ref 0–44)
AST: 24 U/L (ref 15–41)
Albumin: 3.4 g/dL — ABNORMAL LOW (ref 3.5–5.0)
Alkaline Phosphatase: 58 U/L (ref 38–126)
Anion gap: 7 (ref 5–15)
BUN: 27 mg/dL — ABNORMAL HIGH (ref 8–23)
CO2: 20 mmol/L — ABNORMAL LOW (ref 22–32)
Calcium: 9 mg/dL (ref 8.9–10.3)
Chloride: 109 mmol/L (ref 98–111)
Creatinine, Ser: 1.01 mg/dL — ABNORMAL HIGH (ref 0.44–1.00)
GFR, Estimated: >60 mL/min (ref >60)
Glucose, Bld: 103 mg/dL — ABNORMAL HIGH (ref 70–99)
Potassium: 3.4 mmol/L — ABNORMAL LOW (ref 3.5–5.1)
Sodium: 136 mmol/L (ref 135–145)
Total Bilirubin: 0.5 mg/dL (ref 0.3–1.2)
Total Protein: 6.2 g/dL — ABNORMAL LOW (ref 6.5–8.1)

## 2021-08-05 LAB — IRON AND TIBC
Iron: 26 ug/dL — ABNORMAL LOW (ref 28–170)
Saturation Ratios: 9 % — ABNORMAL LOW (ref 10.4–31.8)
TIBC: 296 ug/dL (ref 250–450)
UIBC: 270 ug/dL

## 2021-08-05 LAB — RETICULOCYTES
Immature Retic Fract: 10.6 % (ref 2.3–15.9)
RBC.: 3.53 MIL/uL — ABNORMAL LOW (ref 3.87–5.11)
Retic Count, Absolute: 26.1 10*3/uL (ref 19.0–186.0)
Retic Ct Pct: 0.7 % (ref 0.4–3.1)

## 2021-08-05 LAB — FOLATE: Folate: 6 ng/mL (ref >5.9)

## 2021-08-05 LAB — FERRITIN: Ferritin: 43 ng/mL (ref 11–307)

## 2021-08-05 LAB — CBG MONITORING, ED: Glucose-Capillary: 106 mg/dL — ABNORMAL HIGH (ref 70–99)

## 2021-08-05 LAB — VITAMIN B12: Vitamin B-12: 371 pg/mL (ref 180–914)

## 2021-08-05 LAB — CK: Total CK: 104 U/L (ref 38–234)

## 2021-08-05 MED ORDER — PANTOPRAZOLE SODIUM 40 MG PO TBEC
40.0000 mg | DELAYED_RELEASE_TABLET | Freq: Once | ORAL | Status: AC
Start: 2021-08-05 — End: 2021-08-05
  Administered 2021-08-05: 40 mg via ORAL
  Filled 2021-08-05: qty 1

## 2021-08-05 MED ORDER — PANTOPRAZOLE SODIUM 40 MG PO TBEC: 40.0000 mg | DELAYED_RELEASE_TABLET | Freq: Every day | ORAL | 0 refills | Status: DC

## 2021-08-05 MED ORDER — LACTATED RINGERS IV BOLUS
1000.0000 mL | Freq: Once | INTRAVENOUS | Status: AC
  Administered 2021-08-05: 1000 mL via INTRAVENOUS

## 2021-08-05 MED ORDER — POTASSIUM CHLORIDE CRYS ER 20 MEQ PO TBCR
40.0000 meq | EXTENDED_RELEASE_TABLET | Freq: Once | ORAL | Status: AC
  Administered 2021-08-05: 40 meq via ORAL
  Filled 2021-08-05: qty 2

## 2021-08-05 NOTE — ED Provider Notes (Signed)
[facility] DEPT Provider Note   CSN: [id number] Arrival date & time: 08/05/21  1655     History  Chief Complaint  Patient presents with   Hypotension    Amber Hickman is a 66 y.o. female.  HPI 66 year old female presents with lightheadedness and confusion.  History is initially from EMS and then the patient.  Patient went to her hair appointment but was apparently a day early.  Staff saw her stumble out of her car and injured her left ankle.  Her initial blood pressure was 78/40.  Staff at the hair place that she was confused.  EMS gave around 500 cc of IV fluids and her blood pressure came up to 126/60.  Patient tells me she has been feeling weak like she has "Parkinson's" and having hard time lifting things since yesterday.  Today she was feeling confused when she was driving like she was getting lost when she should have.  She did not injure her head today.  She states that around 5 days ago her PCP adjusted the way she is taking her blood pressure medicines and taking them both together.  Patient states previous to this she was checking her blood pressure and then seeing what the value was and then deciding whether or not to take her blood pressure medicine.  She denies fevers, headache, chest pain, shortness of breath, vomiting/diarrhea, urinary symptoms or abdominal pain.  Home Medications Prior to Admission medications   Medication Sig Start Date End Date Taking? Authorizing Provider  pantoprazole (PROTONIX) 40 MG tablet Take 1 tablet (40 mg total) by mouth daily. 08/05/21  Yes Sherwood Gambler, MD  alendronate (FOSAMAX) 70 MG tablet Take 1 tablet (70 mg total) by mouth every 7 (seven) days. Take with a full glass of water on an empty stomach. 07/16/21 07/16/22  Liane Comber, NP  aspirin EC 81 MG tablet Take 1 tablet (81 mg total) by mouth daily. Swallow whole. 01/28/21 01/28/22  Liane Comber, NP  baclofen (LIORESAL) 20 MG tablet Take 20 mg  by mouth 3 (three) times daily as needed for muscle spasms. Patient not taking: Reported on 07/16/2021    [provider]  cholecalciferol 5000 units TABS Take 2 tablets daily 01/19/17   Unk Pinto, MD  DULoxetine (CYMBALTA) 60 MG capsule Take  1 capsule  Daily  for Chronic Pain 07/07/20   Unk Pinto, MD  ezetimibe (ZETIA) 10 MG tablet Take 1 tablet (10 mg total) by mouth daily. 04/06/21 07/16/21  Lelon Perla, MD  fluticasone-salmeterol (ADVAIR) 500-50 MCG/ACT AEPB Use 1 inhalation 2 x /day ( every 12 hours ) for COPD /Asthma                           /                    Inhale              into Lungs 04/20/21   Unk Pinto, MD  gabapentin (NEURONTIN) 800 MG tablet Take  1 table t 3 x /day  for Chronic Pain                                                                    /  TAKE  BY MOUTH 03/27/21   Unk Pinto, MD  hydrochlorothiazide (HYDRODIURIL) 25 MG tablet Take 1 tablet daily for BP and fluid 03/09/21   Unk Pinto, MD  ketotifen (ZADITOR) 0.025 % ophthalmic solution Place 1 drop into both eyes 3 (three) times daily as needed (allergies).    [provider]  olmesartan (BENICAR) 20 MG tablet TAKE 1 TABLET BY MOUTH DAILY 04/14/21   Alycia Rossetti, NP  oxyCODONE (ROXICODONE) 15 MG immediate release tablet Take 15 mg by mouth every 6 (six) hours as needed for pain.  07/04/17   [provider]  rosuvastatin (CRESTOR) 40 MG tablet Take 1 tab daily for cholesterol. 02/18/21   Liane Comber, NP  topiramate (TOPAMAX) 25 MG tablet Take 1-2 tabs daily in the evening. 07/21/21   Liane Comber, NP      Allergies    Augmentin [amoxicillin-pot clavulanate], Codeine, Zoloft [sertraline hcl], and Lipitor [atorvastatin]    Review of Systems   Review of Systems  Constitutional:  Positive for fatigue. Negative for fever.  Respiratory:  Positive for cough (chronic, unchanged). Negative for shortness of breath.   Cardiovascular:   Negative for chest pain.  Gastrointestinal:  Negative for abdominal pain, diarrhea and vomiting.  Genitourinary:  Negative for dysuria.  Musculoskeletal:  Positive for arthralgias (mild left ankle pain).  Neurological:  Negative for syncope and headaches.  Psychiatric/Behavioral:  Positive for confusion.     Physical Exam Updated Vital Signs BP (!) 141/80 (BP Location: Right Arm)   Pulse 78   Temp 99.1 F (37.3 C) (Oral)   Resp 18   Ht '5\' 4"'$  (1.626 m)   Wt 72.1 kg   SpO2 98%   BMI 27.29 kg/m  Physical Exam Vitals and nursing note reviewed.  Constitutional:      Appearance: She is well-developed.  HENT:     Head: Normocephalic and atraumatic.     Mouth/Throat:     Mouth: Mucous membranes are dry.  Eyes:     Extraocular Movements: Extraocular movements intact.     Pupils: Pupils are equal, round, and reactive to light.  Cardiovascular:     Rate and Rhythm: Normal rate and regular rhythm.     Heart sounds: Normal heart sounds.  Pulmonary:     Effort: Pulmonary effort is normal.     Breath sounds: Normal breath sounds.  Abdominal:     Palpations: Abdomen is soft.     Tenderness: There is no abdominal tenderness.  Musculoskeletal:     Left ankle: No swelling or deformity. Tenderness present over the lateral malleolus (mild). Normal range of motion.     Left Achilles Tendon: No tenderness.     Left foot: No tenderness.  Skin:    General: Skin is warm and dry.  Neurological:     Mental Status: She is alert and oriented to person, place, and time.     Comments: CN 3-12 grossly intact. 5/5 strength in all 4 extremities. Grossly normal sensation. Normal finger to nose.      ED Results / Procedures / Treatments   Labs (all labs ordered are listed, but only abnormal results are displayed) Labs Reviewed  URINALYSIS, ROUTINE W REFLEX MICROSCOPIC - Abnormal; Notable for the following components:      Result Value   Color, Urine STRAW (*)    Specific Gravity, Urine 1.003  (*)    Hgb urine dipstick MODERATE (*)    Leukocytes,Ua TRACE (*)    All other components within normal limits  COMPREHENSIVE METABOLIC PANEL - Abnormal; Notable for the following components:   Potassium 3.4 (*)    CO2 20 (*)    Glucose, Bld 103 (*)    BUN 27 (*)    Creatinine, Ser 1.01 (*)    Total Protein 6.2 (*)    Albumin 3.4 (*)    All other components within normal limits  CBC WITH DIFFERENTIAL/PLATELET - Abnormal; Notable for the following components:   RBC 3.56 (*)    Hemoglobin 10.5 (*)    HCT 32.9 (*)    Neutro Abs 8.1 (*)    All other components within normal limits  RETICULOCYTES - Abnormal; Notable for the following components:   RBC. 3.53 (*)    All other components within normal limits  IRON AND TIBC - Abnormal; Notable for the following components:   Iron 26 (*)    Saturation Ratios 9 (*)    All other components within normal limits  CBG MONITORING, ED - Abnormal; Notable for the following components:   Glucose-Capillary 106 (*)    All other components within normal limits  VITAMIN B12  FERRITIN  FOLATE  CK  POC OCCULT BLOOD, ED    EKG EKG Interpretation  Date/Time:  Wednesday August 05 2021 17:27:41 EDT Ventricular Rate:  76 PR Interval:  164 QRS Duration: 98 QT Interval:  409 QTC Calculation: 460 R Axis:   68 Text Interpretation: Normal sinus rhythm no acute ST/T changes similar to Sept 2022 Confirmed by Sherwood Gambler [phone]) on 08/05/2021 5:45:32 PM  Radiology CT Ankle Left Wo Contrast  Result Date: 08/05/2021 CLINICAL DATA:  Left ankle injury after a fall. EXAM: CT OF THE LEFT ANKLE WITHOUT CONTRAST TECHNIQUE: Multidetector CT imaging of the left ankle was performed according to the standard protocol. Multiplanar CT image reconstructions were also generated. RADIATION DOSE REDUCTION: This exam was performed according to the departmental dose-optimization program which includes automated exposure control, adjustment of the mA and/or kV according to  patient size and/or use of iterative reconstruction technique. COMPARISON:  Left ankle x-rays from same day. FINDINGS: Bones/Joint/Cartilage Acute nondisplaced oblique longitudinal fracture of the distal fibular metaphysis and lateral malleolus. No additional fracture. No dislocation. The ankle mortise is symmetric. The talar dome is intact. Joint spaces are preserved. No joint effusion. Ligaments Ligaments are suboptimally evaluated by CT. Muscles and Tendons Grossly intact. Soft tissue Lateral malleolar soft tissue swelling. No fluid collection or hematoma. No soft tissue mass. IMPRESSION: 1. Acute nondisplaced oblique longitudinal fracture of the distal fibula. Electronically Signed   By: Titus Dubin M.D.   On: 08/05/2021 19:11   DG Ankle Complete Left  Result Date: 08/05/2021 CLINICAL DATA:  Near syncope and subsequent fall. EXAM: LEFT ANKLE COMPLETE - 3+ VIEW COMPARISON:  None Available. FINDINGS: An ill-defined chronic fracture deformity is seen involving the shaft of the distal left fibula. An additional acute, nondisplaced fracture of the left lateral malleolus is suspected. This is best seen on the lateral view. There is no evidence of dislocation. Mild to moderate severity lateral soft tissue swelling is seen. IMPRESSION: 1. Acute, nondisplaced fracture of the left lateral malleolus. CT correlation is recommended. 2. Chronic fracture deformity of the distal left fibula. Electronically Signed   By: Virgina Norfolk M.D.   On: 08/05/2021 18:13   DG Chest Portable 1 View  Result Date: 08/05/2021 CLINICAL DATA:  Near syncope. EXAM: PORTABLE CHEST 1 VIEW COMPARISON:  September 30, 2017 FINDINGS: The heart size and mediastinal contours are within normal limits. Mild,  diffuse, chronic appearing increased interstitial lung markings are noted. There is no evidence of acute infiltrate, pleural effusion or pneumothorax. Degenerative changes seen throughout the thoracic spine. IMPRESSION: No active  cardiopulmonary disease. Electronically Signed   By: Virgina Norfolk M.D.   On: 08/05/2021 18:11    Procedures Procedures    Medications Ordered in ED Medications  lactated ringers bolus 1,000 mL (0 mLs Intravenous Stopped 08/05/21 1901)  potassium chloride SA (KLOR-CON M) CR tablet 40 mEq (40 mEq Oral Given 08/05/21 1900)  pantoprazole (PROTONIX) EC tablet 40 mg (40 mg Oral Given 08/05/21 2025)    ED Course/ Medical Decision Making/ A&P                           Medical Decision Making Amount and/or Complexity of Data Reviewed Independent Historian: EMS External Data Reviewed: notes. Labs: ordered.    Details: Hemoglobin of 10 is significantly lower than 14 20 days ago Mild bump in creatinine and mild hypokalemia Radiology: ordered.    Details: Left distal fibula fracture seen on CT scan. ECG/medicine tests: ordered and independent interpretation performed.    Details: No acute ischemia Discussion of management or test interpretation with external provider(s): Discussed with Dr. Percell Miller of orthopedics  Risk Prescription drug management.   Patient presents after near syncope and hypotension.  Most likely a combination of increased blood pressure medicine use as he was taken at as needed and now is taking it more consistently.  However she is also noted to have a new anemia with a hemoglobin of 10 compared to 14 about 3 weeks ago.  I performed a rectal exam with nurse chaperone but there was no stool.  She reports no melena or significant bleeding besides occasional amounts of blood when straining hard for a bowel movement.  However, Patient has been taking ibuprofen, [phone] mg per day for her back after being told to stop tylenol due to abnormal liver numbers.  This could be causing bleeding but I would expect melena.  She has no abdominal discomfort.  I will put her on Protonix.  I offered to admit given the drop in hemoglobin in the near syncope and hypotension though she states  she cannot stay and wants to go home.  Given her vital signs have stabilized I do not think this is unreasonable but did discuss she has to come back with any new or worsening symptoms.  She was given IV fluids.  As for her fibula fracture, I discussed with Dr. Percell Miller who will see her in 2 days and advises toe-touch with a walker (patient has a walker) and a cam boot.  Stable for discharge but discussed she should follow-up closely with PCP to discuss her BP medicine management and ideally get repeat labs in the next couple days.        Final Clinical Impression(s) / ED Diagnoses Final diagnoses:  Hypotension, unspecified hypotension type  Anemia, unspecified type  Closed fracture of distal end of left fibula, unspecified fracture morphology, initial encounter    Rx / DC Orders ED Discharge Orders          Ordered    pantoprazole (PROTONIX) 40 MG tablet  Daily        08/05/21 2019              Sherwood Gambler, MD 08/05/21 2322

## 2021-08-05 NOTE — Discharge Instructions (Addendum)
STOP taking ibuprofen or other NSAIDs.  Start taking the antiacid medicine Protonix that you are being prescribed.  Use the boot at all times.  You can "toe-touch" with limited weightbearing with a walker on your left foot/leg.  Follow-up with the orthopedist Dr. Percell Miller at 8:30 AM on Friday morning, 6/23.  You do not need to make an appointment.  Do not take your blood pressure medicine in the morning but call your primary care physician to see how they want you to take your blood pressure meds.  You will need to have your CBC rechecked in about 2 days  If at any point you develop dizziness or lightheadedness, confusion or weakness, chest pain, shortness of breath, or any other new/concerning symptoms then return to the ER for evaluation.

## 2021-08-05 NOTE — ED Triage Notes (Signed)
Ems brought pt in for hypotension. States spt went to get her hair fixed today and when getting out of the car, felt weak and went to the ground. Pt's initial BP was 78/40. After 598m of fluids BP is 126/60.

## 2021-08-05 NOTE — ED Notes (Signed)
Patient transported to CT 

## 2021-08-05 NOTE — ED Notes (Signed)
Recollected yellow top

## 2021-08-10 ENCOUNTER — Other Ambulatory Visit: Payer: Self-pay | Admitting: Internal Medicine

## 2021-08-10 DIAGNOSIS — F3341 Major depressive disorder, recurrent, in partial remission: Secondary | ICD-10-CM

## 2021-08-10 MED ORDER — DULOXETINE HCL 60 MG PO CPEP: ORAL_CAPSULE | ORAL | 3 refills | Status: DC

## 2021-08-11 ENCOUNTER — Encounter: Payer: Self-pay | Admitting: Nurse Practitioner

## 2021-08-11 ENCOUNTER — Ambulatory Visit (INDEPENDENT_AMBULATORY_CARE_PROVIDER_SITE_OTHER): Payer: Medicare Other | Admitting: Nurse Practitioner

## 2021-08-11 VITALS — BP 98/58 | HR 82 | Temp 96.8°F | Wt 145.0 lb

## 2021-08-11 DIAGNOSIS — E860 Dehydration: Secondary | ICD-10-CM | POA: Diagnosis not present

## 2021-08-11 DIAGNOSIS — E861 Hypovolemia: Secondary | ICD-10-CM

## 2021-08-11 DIAGNOSIS — E876 Hypokalemia: Secondary | ICD-10-CM

## 2021-08-11 DIAGNOSIS — R82998 Other abnormal findings in urine: Secondary | ICD-10-CM

## 2021-08-11 DIAGNOSIS — S8262XG Displaced fracture of lateral malleolus of left fibula, subsequent encounter for closed fracture with delayed healing: Secondary | ICD-10-CM

## 2021-08-11 DIAGNOSIS — D508 Other iron deficiency anemias: Secondary | ICD-10-CM | POA: Diagnosis not present

## 2021-08-11 DIAGNOSIS — W19XXXD Unspecified fall, subsequent encounter: Secondary | ICD-10-CM

## 2021-08-11 DIAGNOSIS — I9589 Other hypotension: Secondary | ICD-10-CM | POA: Diagnosis not present

## 2021-08-11 DIAGNOSIS — D649 Anemia, unspecified: Secondary | ICD-10-CM

## 2021-08-12 DIAGNOSIS — M25572 Pain in left ankle and joints of left foot: Secondary | ICD-10-CM | POA: Diagnosis not present

## 2021-08-12 LAB — CBC WITH DIFFERENTIAL/PLATELET
Absolute Monocytes: 611 cells/uL (ref 200–950)
Basophils Absolute: 33 cells/uL (ref 0–200)
Basophils Relative: 0.5 %
Eosinophils Absolute: 351 cells/uL (ref 15–500)
Eosinophils Relative: 5.4 %
HCT: 37.5 % (ref 35.0–45.0)
Hemoglobin: 12.2 g/dL (ref 11.7–15.5)
Lymphs Abs: 1872 cells/uL (ref 850–3900)
MCH: 29.4 pg (ref 27.0–33.0)
MCHC: 32.5 g/dL (ref 32.0–36.0)
MCV: 90.4 fL (ref 80.0–100.0)
MPV: 10.3 fL (ref 7.5–12.5)
Monocytes Relative: 9.4 %
Neutro Abs: 3634 cells/uL (ref 1500–7800)
Neutrophils Relative %: 55.9 %
Platelets: 282 10*3/uL (ref 140–400)
RBC: 4.15 10*6/uL (ref 3.80–5.10)
RDW: 13.1 % (ref 11.0–15.0)
Total Lymphocyte: 28.8 %
WBC: 6.5 10*3/uL (ref 3.8–10.8)

## 2021-08-12 LAB — COMPLETE METABOLIC PANEL WITH GFR
AG Ratio: 1.4 (calc) (ref 1.0–2.5)
ALT: 24 U/L (ref 6–29)
AST: 24 U/L (ref 10–35)
Albumin: 3.9 g/dL (ref 3.6–5.1)
Alkaline phosphatase (APISO): 62 U/L (ref 37–153)
BUN: 17 mg/dL (ref 7–25)
CO2: 26 mmol/L (ref 20–32)
Calcium: 9.8 mg/dL (ref 8.6–10.4)
Chloride: 107 mmol/L (ref 98–110)
Creat: 0.9 mg/dL (ref 0.50–1.05)
Globulin: 2.7 g/dL (calc) (ref 1.9–3.7)
Glucose, Bld: 97 mg/dL (ref 65–99)
Potassium: 5.3 mmol/L (ref 3.5–5.3)
Sodium: 140 mmol/L (ref 135–146)
Total Bilirubin: 0.4 mg/dL (ref 0.2–1.2)
Total Protein: 6.6 g/dL (ref 6.1–8.1)
eGFR: 71 mL/min/{1.73_m2} (ref >=60)

## 2021-08-12 LAB — URINALYSIS, ROUTINE W REFLEX MICROSCOPIC
Bilirubin Urine: NEGATIVE
Glucose, UA: NEGATIVE
Hgb urine dipstick: NEGATIVE
Ketones, ur: NEGATIVE
Leukocytes,Ua: NEGATIVE
Nitrite: NEGATIVE
Protein, ur: NEGATIVE
Specific Gravity, Urine: 1.019 (ref 1.001–1.035)
pH: 5.5 (ref 5.0–8.0)

## 2021-08-12 LAB — IRON,TIBC AND FERRITIN PANEL
%SAT: 13 % (calc) — ABNORMAL LOW (ref 16–45)
Ferritin: 68 ng/mL (ref 16–288)
Iron: 43 ug/dL — ABNORMAL LOW (ref 45–160)
TIBC: 326 mcg/dL (calc) (ref 250–450)

## 2021-08-19 ENCOUNTER — Encounter: Payer: Self-pay | Admitting: Nurse Practitioner

## 2021-08-19 ENCOUNTER — Ambulatory Visit (INDEPENDENT_AMBULATORY_CARE_PROVIDER_SITE_OTHER): Payer: Medicare Other | Admitting: Nurse Practitioner

## 2021-08-19 VITALS — BP 115/70 | HR 81 | Temp 96.8°F | Wt 156.0 lb

## 2021-08-19 DIAGNOSIS — G894 Chronic pain syndrome: Secondary | ICD-10-CM | POA: Diagnosis not present

## 2021-08-19 DIAGNOSIS — I10 Essential (primary) hypertension: Secondary | ICD-10-CM | POA: Diagnosis not present

## 2021-08-19 DIAGNOSIS — S8262XG Displaced fracture of lateral malleolus of left fibula, subsequent encounter for closed fracture with delayed healing: Secondary | ICD-10-CM

## 2021-08-19 NOTE — Patient Instructions (Signed)

## 2021-08-19 NOTE — Progress Notes (Signed)
Assessment and Plan:  Amber Hickman was seen today for a follow up.  Diagnoses and all order for this visit:  1. Essential hypertension Better Controlled  Continue Losartan if BP is >130/90. Discussed DASH (Dietary Approaches to Stop Hypertension) DASH diet is lower in sodium than a typical American diet. Cut back on foods that are high in saturated fat, cholesterol, and trans fats. Eat more whole-grain foods, fish, poultry, and nuts Remain active and exercise as tolerated daily.  Monitor BP at home-Call if greater than 130/80.    2. Chronic pain syndrome Continue to follow with Pain Management Discuss the way you take your medications for review and evaluation of increase in falls, gait, mobility.   3. Closed displaced fracture of lateral malleolus of left fibula with delayed healing, subsequent encounter Continue to follow with Dr. Percell Miller.  Notify office for further evaluation and treatment, questions or concerns if s/s fail to improve. The risks and benefits of my recommendations, as well as other treatment options were discussed with the patient today. Questions were answered.  Further disposition pending results of labs. Discussed med's effects and SE's.    Over 20 minutes of exam, counseling, chart review, and critical decision making was performed.   Future Appointments  Date Time Provider [place]  10/30/2021 11:00 AM Unk Pinto, MD [facility] None  07/19/2022 10:00 AM Darrol Jump, NP [facility] None    ------------------------------------------------------------------------------------------------------------------   HPI BP 115/70   Pulse 81   Temp (!) 96.8 F (36 C)   Wt 156 lb (70.8 kg)   SpO2 97%   BMI 26.78 kg/m   66 y.o.female presents for a follow up.  Recently fell on 08/05/21 with an ER visit d/t hypotension secondary to a change in her anti-hypertensive medication, Losartan.    Patient reports she has now been taking  Losartan the way she used to take when BP is >130/90.  This has kept her BP stable.  She denies dizziness, syncope, heart palpations, chest pain.   During her 08/11/21 OV blood work was obtained and confirmed stable with improvement in Iron.  No evidence of UTI.  Also during her ED visit she had a closed displaced fracture of lateral malleolus of left fibula.  She presents with left hard plastic boot in place.  She reports following up with Dr. Percell Miller with worsening fracture, therefore she will have  surgery tomorrow 08/19/21.  Past Medical History:  Diagnosis Date   Allergy    Arthritis    AVN (avascular necrosis of bone) (HCC)    Left shoulder   COPD (chronic obstructive pulmonary disease) (HCC)    Depression    GERD (gastroesophageal reflux disease)    Hyperlipidemia    Hypertension    Memory disorder 12/23/2016   Osteoporosis    TIA (transient ischemic attack) 09/30/2017   Vitamin D deficiency      Allergies  Allergen Reactions   Augmentin [Amoxicillin-Pot Clavulanate] Nausea And Vomiting   Codeine Nausea And Vomiting   Zoloft [Sertraline Hcl]     Pt does not recall reaction    Lipitor [Atorvastatin] Other (See Comments)    Leg pain    Current Outpatient Medications on File Prior to Visit  Medication Sig   alendronate (FOSAMAX) 70 MG tablet Take 1 tablet (70 mg total) by mouth every 7 (seven) days. Take with a full glass of water on an empty stomach.   aspirin EC 81 MG tablet Take 1 tablet (81 mg total) by mouth daily. Swallow  whole.   baclofen (LIORESAL) 20 MG tablet Take 20 mg by mouth 3 (three) times daily as needed for muscle spasms. (Patient not taking: Reported on 07/16/2021)   cholecalciferol 5000 units TABS Take 2 tablets daily   DULoxetine (CYMBALTA) 60 MG capsule Take  1 capsule  Daily  for Chronic Pain                                       /                       TAKE              BY              MOUTH   ezetimibe (ZETIA) 10 MG tablet Take 1 tablet (10 mg total) by  mouth daily.   fluticasone-salmeterol (ADVAIR) 500-50 MCG/ACT AEPB Use 1 inhalation 2 x /day ( every 12 hours ) for COPD /Asthma                           /                    Inhale              into Lungs   gabapentin (NEURONTIN) 800 MG tablet Take  1 table t 3 x /day  for Chronic Pain                                                                    /                              TAKE  BY MOUTH   hydrochlorothiazide (HYDRODIURIL) 25 MG tablet Take 1 tablet daily for BP and fluid   ketotifen (ZADITOR) 0.025 % ophthalmic solution Place 1 drop into both eyes 3 (three) times daily as needed (allergies).   olmesartan (BENICAR) 20 MG tablet TAKE 1 TABLET BY MOUTH DAILY   oxyCODONE (ROXICODONE) 15 MG immediate release tablet Take 15 mg by mouth every 6 (six) hours as needed for pain.    pantoprazole (PROTONIX) 40 MG tablet Take 1 tablet (40 mg total) by mouth daily.   rosuvastatin (CRESTOR) 40 MG tablet Take 1 tab daily for cholesterol.   topiramate (TOPAMAX) 25 MG tablet Take 1-2 tabs daily in the evening.   No current facility-administered medications on file prior to visit.    ROS: all negative except what is noted in the HPI.   ROS   Physical Exam:  BP 115/70   Pulse 81   Temp (!) 96.8 F (36 C)   Wt 156 lb (70.8 kg)   SpO2 97%   BMI 26.78 kg/m   General Appearance: NAD.  Awake, conversant and cooperative. Eyes: PERRLA, EOMs intact.  Sclera white.  Conjunctiva without erythema. Sinuses: No frontal/maxillary tenderness.  No nasal discharge. Nares patent.  ENT/Mouth: Ext aud canals clear.  Bilateral TMs w/DOL and without erythema or bulging. Hearing intact.  Posterior pharynx without swelling or exudate.  Tonsils without  swelling or erythema.  Neck: Supple.  No masses, nodules or thyromegaly. Respiratory: Effort is regular with non-labored breathing. Breath sounds are equal bilaterally without rales, rhonchi, wheezing or stridor.  Cardio: RRR with no MRGs. Brisk peripheral pulses  without edema.  Abdomen: Active BS in all four quadrants.  Soft and non-tender without guarding, rebound tenderness, hernias or masses. Lymphatics: Non tender without lymphadenopathy.  Musculoskeletal: Full ROM, 5/5 strength, normal ambulation.  No clubbing or cyanosis. Skin: Appropriate color for ethnicity. Warm without rashes, lesions, ecchymosis, ulcers.  Neuro: CN II-XII grossly normal. Normal muscle tone without cerebellar symptoms and intact sensation.   Psych: AO X 3,  appropriate mood and affect, insight and judgment.     Darrol Jump, NP 2:43 PM [facility]

## 2021-08-20 DIAGNOSIS — G8918 Other acute postprocedural pain: Secondary | ICD-10-CM | POA: Diagnosis not present

## 2021-08-20 DIAGNOSIS — S93492A Sprain of other ligament of left ankle, initial encounter: Secondary | ICD-10-CM | POA: Diagnosis not present

## 2021-08-20 DIAGNOSIS — S93432A Sprain of tibiofibular ligament of left ankle, initial encounter: Secondary | ICD-10-CM | POA: Diagnosis not present

## 2021-08-20 DIAGNOSIS — S82842A Displaced bimalleolar fracture of left lower leg, initial encounter for closed fracture: Secondary | ICD-10-CM | POA: Diagnosis not present

## 2021-08-27 DIAGNOSIS — G894 Chronic pain syndrome: Secondary | ICD-10-CM | POA: Diagnosis not present

## 2021-08-27 DIAGNOSIS — M47896 Other spondylosis, lumbar region: Secondary | ICD-10-CM | POA: Diagnosis not present

## 2021-08-27 DIAGNOSIS — M545 Low back pain, unspecified: Secondary | ICD-10-CM | POA: Diagnosis not present

## 2021-08-31 DIAGNOSIS — S82842D Displaced bimalleolar fracture of left lower leg, subsequent encounter for closed fracture with routine healing: Secondary | ICD-10-CM | POA: Diagnosis not present

## 2021-09-23 DIAGNOSIS — Z79891 Long term (current) use of opiate analgesic: Secondary | ICD-10-CM | POA: Diagnosis not present

## 2021-09-23 DIAGNOSIS — Z79899 Other long term (current) drug therapy: Secondary | ICD-10-CM | POA: Diagnosis not present

## 2021-09-23 DIAGNOSIS — G894 Chronic pain syndrome: Secondary | ICD-10-CM | POA: Diagnosis not present

## 2021-09-23 DIAGNOSIS — M545 Low back pain, unspecified: Secondary | ICD-10-CM | POA: Diagnosis not present

## 2021-09-28 DIAGNOSIS — S82842D Displaced bimalleolar fracture of left lower leg, subsequent encounter for closed fracture with routine healing: Secondary | ICD-10-CM | POA: Diagnosis not present

## 2021-10-07 DIAGNOSIS — S82842D Displaced bimalleolar fracture of left lower leg, subsequent encounter for closed fracture with routine healing: Secondary | ICD-10-CM | POA: Diagnosis not present

## 2021-10-07 DIAGNOSIS — R262 Difficulty in walking, not elsewhere classified: Secondary | ICD-10-CM | POA: Diagnosis not present

## 2021-10-07 DIAGNOSIS — M25672 Stiffness of left ankle, not elsewhere classified: Secondary | ICD-10-CM | POA: Diagnosis not present

## 2021-10-07 DIAGNOSIS — M6281 Muscle weakness (generalized): Secondary | ICD-10-CM | POA: Diagnosis not present

## 2021-10-16 DIAGNOSIS — S82842D Displaced bimalleolar fracture of left lower leg, subsequent encounter for closed fracture with routine healing: Secondary | ICD-10-CM | POA: Diagnosis not present

## 2021-10-16 DIAGNOSIS — R262 Difficulty in walking, not elsewhere classified: Secondary | ICD-10-CM | POA: Diagnosis not present

## 2021-10-16 DIAGNOSIS — M25672 Stiffness of left ankle, not elsewhere classified: Secondary | ICD-10-CM | POA: Diagnosis not present

## 2021-10-16 DIAGNOSIS — M6281 Muscle weakness (generalized): Secondary | ICD-10-CM | POA: Diagnosis not present

## 2021-10-21 DIAGNOSIS — M47896 Other spondylosis, lumbar region: Secondary | ICD-10-CM | POA: Diagnosis not present

## 2021-10-21 DIAGNOSIS — M545 Low back pain, unspecified: Secondary | ICD-10-CM | POA: Diagnosis not present

## 2021-10-21 DIAGNOSIS — G894 Chronic pain syndrome: Secondary | ICD-10-CM | POA: Diagnosis not present

## 2021-10-27 DIAGNOSIS — M25672 Stiffness of left ankle, not elsewhere classified: Secondary | ICD-10-CM | POA: Diagnosis not present

## 2021-10-27 DIAGNOSIS — R262 Difficulty in walking, not elsewhere classified: Secondary | ICD-10-CM | POA: Diagnosis not present

## 2021-10-27 DIAGNOSIS — M6281 Muscle weakness (generalized): Secondary | ICD-10-CM | POA: Diagnosis not present

## 2021-10-27 DIAGNOSIS — S82842D Displaced bimalleolar fracture of left lower leg, subsequent encounter for closed fracture with routine healing: Secondary | ICD-10-CM | POA: Diagnosis not present

## 2021-10-29 ENCOUNTER — Encounter: Payer: Self-pay | Admitting: Internal Medicine

## 2021-10-29 NOTE — Progress Notes (Unsigned)
Annual Screening/Preventative Visit & Comprehensive Evaluation &  Examination  Future Appointments  Date Time Provider Department  10/30/2021 11:00 AM Unk Pinto, MD [facility]  07/19/2022 10:00 AM Darrol Jump, NP [facility]  11/03/2022 11:00 AM Unk Pinto, MD [facility]          This very nice 66 y.o. single WF presents for a Screening /Preventative Visit & comprehensive evaluation and management of multiple medical co-morbidities.  Patient has been followed for HTN, HLD, Prediabetes  and Vitamin D Deficiency. Patient has hx/o  COPD & OSA (off CPAP). Hx/o Major Depression in remission on Prozac. CT scan in Aug 2021 showed Aortic Atherosclerosis.                                                     The patient is followed in pain management and is on SS Disability for chronic pain from Lumbar DDD &  surg x 2 in 2004.                                                         Patient has COPD from 35+ pk years smoking history. Last year,  she underwent LD Screening  & we discussed lung cancer screening again.  She  is agreeable to undergo another screening low dose CT scan of the chest.  I will refer her for a LDCT lung scan. She alleges stopping smoking in March 2019.         Labile HTN predates since 1998. Patient's BP has been controlled at home and patient denies any cardiac symptoms as chest pain, palpitations, shortness of breath, dizziness or ankle swelling. Today's BP is at goal -  118/78.       Patient's hyperlipidemia is controlled with diet and medications. Patient denies myalgias or other medication SE's. Last lipids were at goal :  Lab Results  Component Value Date   CHOL 129 07/16/2021   HDL 67 07/16/2021   LDLCALC 41 07/16/2021   TRIG 119 07/16/2021   CHOLHDL 1.9 07/16/2021         Patient has hx/o prediabetes predating since      and patient denies reactive hypoglycemic symptoms, visual blurring, diabetic polys or paresthesias. Last A1c was  not at  goal :  Lab Results  Component Value Date   HGBA1C 6.2 (H) 04/06/2021         Finally, patient has history of Vitamin D Deficiency and last Vitamin D was  at goal:  Lab Results  Component Value Date   VD25OH 67 04/06/2021        Current Outpatient Medications:    alendronate (FOSAMAX) 70 MG tablet, Take 1 tablet (70 mg total) by mouth every 7 (seven) days. Take with a full glass of water on an empty stomach., Disp: 12 tablet, Rfl: 3   aspirin EC 81 MG tablet, Take 1 tablet (81 mg total) by mouth daily. Swallow whole., Disp: 90 tablet, Rfl: 3   baclofen (LIORESAL) 20 MG tablet, Take 20 mg by mouth 3 (three) times daily as needed for muscle spasms., Disp: , Rfl:    cholecalciferol 5000 units TABS, Take 2 tablets daily, Disp: ,  Rfl:    DULoxetine (CYMBALTA) 60 MG capsule, Take  1 capsule  Daily  for Chronic Pain                                       /                       TAKE              BY              MOUTH, Disp: 90 capsule, Rfl: 3   fluticasone-salmeterol (ADVAIR) 500-50 MCG/ACT AEPB, Use 1 inhalation 2 x /day ( every 12 hours ) for COPD /Asthma                           /                    Inhale              into Lungs, Disp: 180 each, Rfl: 3   gabapentin (NEURONTIN) 800 MG tablet, Take  1 table t 3 x /day  for Chronic Pain                                                                    /                              TAKE  BY MOUTH, Disp: 270 tablet, Rfl: 3   hydrochlorothiazide (HYDRODIURIL) 25 MG tablet, Take 1 tablet daily for BP and fluid, Disp: 90 tablet, Rfl: 3   ketotifen (ZADITOR) 0.025 % ophthalmic solution, Place 1 drop into both eyes 3 (three) times daily as needed (allergies)., Disp: , Rfl:    olmesartan (BENICAR) 20 MG tablet, TAKE 1 TABLET BY MOUTH DAILY, Disp: 30 tablet, Rfl: 11   oxyCODONE (ROXICODONE) 15 MG immediate release tablet, Take 15 mg by mouth every 6 (six) hours as needed for pain. , Disp: , Rfl: 0   rosuvastatin (CRESTOR) 40 MG tablet, Take 1 tab  daily for cholesterol., Disp: 90 tablet, Rfl: 3   topiramate (TOPAMAX) 25 MG tablet, Take 1-2 tabs daily in the evening., Disp: 180 tablet, Rfl: 0   ezetimibe (ZETIA) 10 MG tablet, Take 1 tablet (10 mg total) by mouth daily., Disp: 90 tablet, Rfl: 3   pantoprazole (PROTONIX) 40 MG tablet, Take 1 tablet (40 mg total) by mouth daily. (Patient not taking: Reported on 10/30/2021), Disp: 30 tablet, Rfl: 0    Allergies  Allergen Reactions   Augmentin  Nausea And Vomiting   Codeine Nausea And Vomiting   Zoloft [Sertraline Hcl] Pt does not recall reaction    Lipitor [Atorvastatin] Leg pain     Past Medical History:  Diagnosis Date   Allergy    Arthritis    AVN (avascular necrosis of bone) (HCC)    Left shoulder   COPD (chronic obstructive pulmonary disease) (HCC)    Depression    GERD (gastroesophageal reflux disease)    Hyperlipidemia    Hypertension  Memory disorder 12/23/2016   TIA (transient ischemic attack) 09/30/2017   Vitamin D deficiency      Health Maintenance  Topic Date Due   PAP SMEAR-Modifier  01/18/2013   COVID-19 Vaccine (3 - Pfizer risk series) 08/21/2019   PNA vac Low Risk Adult (1 of 2 - PCV13) Never done   Zoster Vaccines- Shingrix (1 of 2) 10/16/2020    MAMMOGRAM  02/11/2022   COLONOSCOPY (Pts 45-17yr Insurance coverage will need to be confirmed)  09/23/2022   TETANUS/TDAP  06/04/2025   DEXA SCAN  Completed   Hepatitis C Screening  Completed   HIV Screening  Completed   HPV VACCINES  Aged Out   INFLUENZA VACCINE  Discontinued    Immunization History  Administered Date(s) Administered   PFIZER SARS-COV-2 Vaccination 07/02/2019, 07/24/2019   Td 01/19/2004, 06/05/2015    Last Colon - 09/22/2017 - Dr MEarlean Shawl- Recc 5 yr   f/u Colon in Aug 2024   Last MGM - 02/12/2020    Past Surgical History:  Procedure Laterality Date   ANKLE SURGERY Right    BACK SURGERY     BREAST EXCISIONAL BIOPSY Right    CATARACT EXTRACTION Bilateral    SPINE SURGERY      lumbar   TONSILLECTOMY AND ADENOIDECTOMY       Family History  Problem Relation Age of Onset   Hypertension Mother    COPD Mother    Stroke Mother    Cancer Mother        breast   Heart disease Father        Age 67 MI   Breast cancer Neg Hx      Social History   Tobacco Use   Smoking status: Former    Packs/day: 1.00    Years: 30.00    Pack years: 30.00    Types: Cigarettes    Quit date: 05/10/2017    Years since quitting: 3.3   Smokeless tobacco: Never  Substance Use Topics   Alcohol use: No   Drug use: No      ROS Constitutional: Denies fever, chills, weight loss/gain, headaches, insomnia,  night sweats, and change in appetite. Does c/o fatigue. Eyes: Denies redness, blurred vision, diplopia, discharge, itchy, watery eyes.  ENT: Denies discharge, congestion, post nasal drip, epistaxis, sore throat, earache, hearing loss, dental pain, Tinnitus, Vertigo, Sinus pain, snoring.  Cardio: Denies chest pain, palpitations, irregular heartbeat, syncope, dyspnea, diaphoresis, orthopnea, PND, claudication, edema Respiratory: denies cough, dyspnea, DOE, pleurisy, hoarseness, laryngitis, wheezing.  Gastrointestinal: Denies dysphagia, heartburn, reflux, water brash, pain, cramps, nausea, vomiting, bloating, diarrhea, constipation, hematemesis, melena, hematochezia, jaundice, hemorrhoids Genitourinary: Denies dysuria, frequency, urgency, nocturia, hesitancy, discharge, hematuria, flank pain Breast: Breast lumps, nipple discharge, bleeding.  Musculoskeletal: Denies arthralgia, myalgia, stiffness, Jt. Swelling, pain, limp, and strain/sprain. Denies falls. Skin: Denies puritis, rash, hives, warts, acne, eczema, changing in skin lesion Neuro: No weakness, tremor, incoordination, spasms, paresthesia, pain Psychiatric: Denies confusion, memory loss, sensory loss. Denies Depression. Endocrine: Denies change in weight, skin, hair change, nocturia, and paresthesia, diabetic polys, visual  blurring, hyper / hypo glycemic episodes.  Heme/Lymph: No excessive bleeding, bruising, enlarged lymph nodes.  Physical Exam  BP 128/82   Pulse 74   Temp (!) 97.5 F (36.4 C)   Resp 16   Ht 5' (1.524 m)   Wt 143 lb 9.6 oz (65.1 kg)   SpO2 98%   BMI 28.04 kg/m   General Appearance: Over nourished and in no apparent distress.  Eyes: PERRLA,  EOMs, conjunctiva no swelling or erythema, normal fundi and vessels. Sinuses: No frontal/maxillary tenderness ENT/Mouth: EACs patent / TMs  nl. Nares clear without erythema, swelling, mucoid exudates. Oral hygiene is good. No erythema, swelling, or exudate. Tongue normal, non-obstructing. Tonsils not swollen or erythematous. Hearing normal.  Neck: Supple, thyroid not palpable. No bruits, nodes or JVD. Respiratory: Respiratory effort normal.  BS equal and clear bilateral without rales, rhonci, wheezing or stridor. Cardio: Heart sounds are normal with regular rate and rhythm and no murmurs, rubs or gallops. Peripheral pulses are normal and equal bilaterally without edema. No aortic or femoral bruits. Chest: symmetric with normal excursions and percussion. Breasts: Symmetric, without lumps, nipple discharge, retractions, or fibrocystic changes.  Abdomen: Flat, soft with bowel sounds active. Nontender, no guarding, rebound, hernias, masses, or organomegaly.  Lymphatics: Non tender without lymphadenopathy.  Musculoskeletal: Full ROM all peripheral extremities, joint stability, 5/5 strength, and normal gait. Skin: Warm and dry without rashes, lesions, cyanosis, clubbing or  ecchymosis.  Neuro: Cranial nerves intact, reflexes equal bilaterally. Normal muscle tone, no cerebellar symptoms. Sensation intact.  Pysch: Alert and oriented X 3, normal affect, Insight and Judgment appropriate.    Assessment and Plan  1. Annual Preventative Screening Examination   2. Essential hypertension  - EKG 12-Lead - [place], RETROPERITNL ABD,  LTD - CBC with  Differential/Platelet - COMPLETE METABOLIC PANEL WITH GFR - Magnesium - TSH  3. Hyperlipidemia, mixed  - EKG 12-Lead - [place], RETROPERITNL ABD,  LTD - Lipid panel - TSH  4. Abnormal glucose  - EKG 12-Lead - [place], RETROPERITNL ABD,  LTD - Hemoglobin A1c - Insulin, random  5. Vitamin D deficiency  - VITAMIN D 25 Hydroxy   6. OSA and COPD overlap syndrome ([place])   7. Depression, major, recurrent, in partial remission (HCC)  - TSH  8. Aortic atherosclerosis ([place]) by CT SScan 10/02/2019  - EKG 12-Lead - [place], RETROPERITNL ABD,  LTD  9. Chronic pain syndrome   10. DDD, lumbar   11. Screening for colorectal cancer  - POC Hemoccult Bld/Stl   12. Screening for ischemic heart disease  - EKG 12-Lead  13. FHx: heart disease  - EKG 12-Lead - [place], RETROPERITNL ABD,  LTD  14. Former smoker (30 pack/year, quit 2019)  - EKG 12-Lead - [place], RETROPERITNL ABD,  LTD  15. Screening for AAA (aortic abdominal aneurysm)  - [place], RETROPERITNL ABD,  LTD  16. Medication management  - Urinalysis, Routine w reflex microscopic - Microalbumin / creatinine urine ratio - CBC with Differential/Platelet - COMPLETE METABOLIC PANEL WITH GFR - Magnesium - Lipid panel - TSH - Hemoglobin A1c - Insulin, random - VITAMIN D 25 Hydroxy          Patient was counseled in prudent diet to achieve/maintain BMI less than 25 for weight control, BP monitoring, regular exercise and medications. Discussed med's effects and SE's. Screening labs and tests as requested with regular follow-up as recommended. Over 40 minutes of exam, counseling, chart review and high complex critical decision making was performed.   Kirtland Bouchard, MD

## 2021-10-30 ENCOUNTER — Encounter: Payer: Self-pay | Admitting: Internal Medicine

## 2021-10-30 ENCOUNTER — Ambulatory Visit (INDEPENDENT_AMBULATORY_CARE_PROVIDER_SITE_OTHER): Payer: Medicare Other | Admitting: Internal Medicine

## 2021-10-30 VITALS — BP 128/82 | HR 74 | Temp 97.5°F | Resp 16 | Ht 60.0 in | Wt 143.6 lb

## 2021-10-30 DIAGNOSIS — G894 Chronic pain syndrome: Secondary | ICD-10-CM

## 2021-10-30 DIAGNOSIS — Z8249 Family history of ischemic heart disease and other diseases of the circulatory system: Secondary | ICD-10-CM | POA: Diagnosis not present

## 2021-10-30 DIAGNOSIS — I10 Essential (primary) hypertension: Secondary | ICD-10-CM | POA: Diagnosis not present

## 2021-10-30 DIAGNOSIS — R7309 Other abnormal glucose: Secondary | ICD-10-CM | POA: Diagnosis not present

## 2021-10-30 DIAGNOSIS — F3341 Major depressive disorder, recurrent, in partial remission: Secondary | ICD-10-CM

## 2021-10-30 DIAGNOSIS — Z79899 Other long term (current) drug therapy: Secondary | ICD-10-CM

## 2021-10-30 DIAGNOSIS — I700 Atherosclerosis of aorta: Secondary | ICD-10-CM

## 2021-10-30 DIAGNOSIS — Z1211 Encounter for screening for malignant neoplasm of colon: Secondary | ICD-10-CM

## 2021-10-30 DIAGNOSIS — Z0000 Encounter for general adult medical examination without abnormal findings: Secondary | ICD-10-CM

## 2021-10-30 DIAGNOSIS — E559 Vitamin D deficiency, unspecified: Secondary | ICD-10-CM

## 2021-10-30 DIAGNOSIS — Z136 Encounter for screening for cardiovascular disorders: Secondary | ICD-10-CM

## 2021-10-30 DIAGNOSIS — E782 Mixed hyperlipidemia: Secondary | ICD-10-CM

## 2021-10-30 DIAGNOSIS — Z87891 Personal history of nicotine dependence: Secondary | ICD-10-CM | POA: Diagnosis not present

## 2021-10-30 DIAGNOSIS — M5136 Other intervertebral disc degeneration, lumbar region: Secondary | ICD-10-CM

## 2021-10-30 DIAGNOSIS — G4733 Obstructive sleep apnea (adult) (pediatric): Secondary | ICD-10-CM

## 2021-10-30 DIAGNOSIS — Z0001 Encounter for general adult medical examination with abnormal findings: Secondary | ICD-10-CM

## 2021-10-30 NOTE — Patient Instructions (Signed)

## 2021-10-31 ENCOUNTER — Encounter: Payer: Self-pay | Admitting: Internal Medicine

## 2021-10-31 NOTE — Progress Notes (Signed)
<><><><><><><><><><><><><><><><><><><><><><><><><><><><><><><><><> <><><><><><><><><><><><><><><><><><><><><><><><><><><><><><><><><>  -   One liver enzyme is elevated,                             So it's important to Avoid Alcohol,                                                                So don't get Cirrhosis of the Liver  <><><><><><><><><><><><><><><><><><><><><><><><><><><><><><><><><> <><><><><><><><><><><><><><><><><><><><><><><><><><><><><><><><><>  -  A1c Better - Down from 6.2% to now 5.8%                                                             - Getting closer to Normal Non Diabetic Range !  <><><><><><><><><><><><><><><><><><><><><><><><><><><><><><><><><>  -  Vitamin D = 84 - Excellent - Please Keep Dose Same  <><><><><><><><><><><><><><><><><><><><><><><><><><><><><><><><><>  -  Total  Chol =  144            (  Ideal  or  Goal is less than 180  !  )  & -  Bad / Dangerous LDL  Chol =  48             (  Ideal  or  Goal is less than 70  !  )   Both  Excellent   - Very low risk for Heart Attack  / Stroke <><><><><><><><><><><><><><><><><><><><><><><><><><><><><><><><><>  -  All Else - CBC - Kidneys - Electrolytes - Liver - Magnesium & Thyroid    - all  Normal / OK <><><><><><><><><><><><><><><><><><><><><><><><><><><><><><><><><> <><><><><><><><><><><><><><><><><><><><><><><><><><><><><><><><><>  -  Keep up the Great Work  !   <><><><><><><><><><><><><><><><><><><><><><><><><><><><><><><><><> <><><><><><><><><><><><><><><><><><><><><><><><><><><><><><><><><>

## 2021-11-02 LAB — COMPLETE METABOLIC PANEL WITH GFR
AG Ratio: 1.7 (calc) (ref 1.0–2.5)
ALT: 41 U/L — ABNORMAL HIGH (ref 6–29)
AST: 32 U/L (ref 10–35)
Albumin: 4.6 g/dL (ref 3.6–5.1)
Alkaline phosphatase (APISO): 84 U/L (ref 37–153)
BUN/Creatinine Ratio: 34 (calc) — ABNORMAL HIGH (ref 6–22)
BUN: 28 mg/dL — ABNORMAL HIGH (ref 7–25)
CO2: 27 mmol/L (ref 20–32)
Calcium: 10.3 mg/dL (ref 8.6–10.4)
Chloride: 109 mmol/L (ref 98–110)
Creat: 0.83 mg/dL (ref 0.50–1.05)
Globulin: 2.7 g/dL (calc) (ref 1.9–3.7)
Glucose, Bld: 114 mg/dL — ABNORMAL HIGH (ref 65–99)
Potassium: 4.7 mmol/L (ref 3.5–5.3)
Sodium: 143 mmol/L (ref 135–146)
Total Bilirubin: 0.5 mg/dL (ref 0.2–1.2)
Total Protein: 7.3 g/dL (ref 6.1–8.1)
eGFR: 78 mL/min/{1.73_m2} (ref >=60)

## 2021-11-02 LAB — VITAMIN D 25 HYDROXY (VIT D DEFICIENCY, FRACTURES): Vit D, 25-Hydroxy: 84 ng/mL (ref 30–100)

## 2021-11-02 LAB — INSULIN, RANDOM: Insulin: 44.7 u[IU]/mL — ABNORMAL HIGH

## 2021-11-02 LAB — CBC WITH DIFFERENTIAL/PLATELET
Absolute Monocytes: 342 cells/uL (ref 200–950)
Basophils Absolute: 30 cells/uL (ref 0–200)
Basophils Relative: 0.5 %
Eosinophils Absolute: 189 cells/uL (ref 15–500)
Eosinophils Relative: 3.2 %
HCT: 42.2 % (ref 35.0–45.0)
Hemoglobin: 13.5 g/dL (ref 11.7–15.5)
Lymphs Abs: 1475 cells/uL (ref 850–3900)
MCH: 29.7 pg (ref 27.0–33.0)
MCHC: 32 g/dL (ref 32.0–36.0)
MCV: 92.7 fL (ref 80.0–100.0)
MPV: 11 fL (ref 7.5–12.5)
Monocytes Relative: 5.8 %
Neutro Abs: 3865 cells/uL (ref 1500–7800)
Neutrophils Relative %: 65.5 %
Platelets: 259 10*3/uL (ref 140–400)
RBC: 4.55 10*6/uL (ref 3.80–5.10)
RDW: 13 % (ref 11.0–15.0)
Total Lymphocyte: 25 %
WBC: 5.9 10*3/uL (ref 3.8–10.8)

## 2021-11-02 LAB — LIPID PANEL
Cholesterol: 144 mg/dL (ref <200)
HDL: 76 mg/dL (ref >=50)
LDL Cholesterol (Calc): 48 mg/dL (calc)
Non-HDL Cholesterol (Calc): 68 mg/dL (calc) (ref <130)
Total CHOL/HDL Ratio: 1.9 (calc) (ref <5.0)
Triglycerides: 113 mg/dL (ref <150)

## 2021-11-02 LAB — URINALYSIS, ROUTINE W REFLEX MICROSCOPIC
Bilirubin Urine: NEGATIVE
Glucose, UA: NEGATIVE
Hgb urine dipstick: NEGATIVE
Ketones, ur: NEGATIVE
Leukocytes,Ua: NEGATIVE
Nitrite: NEGATIVE
Protein, ur: NEGATIVE
Specific Gravity, Urine: 1.017 (ref 1.001–1.035)
pH: 5.5 (ref 5.0–8.0)

## 2021-11-02 LAB — MAGNESIUM: Magnesium: 2.3 mg/dL (ref 1.5–2.5)

## 2021-11-02 LAB — MICROALBUMIN / CREATININE URINE RATIO
Creatinine, Urine: 103 mg/dL (ref 20–275)
Microalb Creat Ratio: 7 mcg/mg creat (ref <30)
Microalb, Ur: 0.7 mg/dL

## 2021-11-02 LAB — HEMOGLOBIN A1C
Hgb A1c MFr Bld: 5.8 % of total Hgb — ABNORMAL HIGH (ref <5.7)
Mean Plasma Glucose: 120 mg/dL
eAG (mmol/L): 6.6 mmol/L

## 2021-11-02 LAB — TSH: TSH: 1.58 mIU/L (ref 0.40–4.50)

## 2021-11-02 NOTE — Progress Notes (Signed)
Patient is aware of lab results and instructions. -e welch

## 2021-11-04 ENCOUNTER — Other Ambulatory Visit: Payer: Self-pay | Admitting: Internal Medicine

## 2021-11-04 DIAGNOSIS — R262 Difficulty in walking, not elsewhere classified: Secondary | ICD-10-CM | POA: Diagnosis not present

## 2021-11-04 DIAGNOSIS — S82842D Displaced bimalleolar fracture of left lower leg, subsequent encounter for closed fracture with routine healing: Secondary | ICD-10-CM | POA: Diagnosis not present

## 2021-11-04 DIAGNOSIS — M6281 Muscle weakness (generalized): Secondary | ICD-10-CM | POA: Diagnosis not present

## 2021-11-04 DIAGNOSIS — M25672 Stiffness of left ankle, not elsewhere classified: Secondary | ICD-10-CM | POA: Diagnosis not present

## 2021-11-09 DIAGNOSIS — S82842D Displaced bimalleolar fracture of left lower leg, subsequent encounter for closed fracture with routine healing: Secondary | ICD-10-CM | POA: Diagnosis not present

## 2021-11-30 DIAGNOSIS — M545 Low back pain, unspecified: Secondary | ICD-10-CM | POA: Diagnosis not present

## 2021-11-30 DIAGNOSIS — G894 Chronic pain syndrome: Secondary | ICD-10-CM | POA: Diagnosis not present

## 2021-11-30 DIAGNOSIS — M47896 Other spondylosis, lumbar region: Secondary | ICD-10-CM | POA: Diagnosis not present

## 2021-11-30 DIAGNOSIS — M5106 Intervertebral disc disorders with myelopathy, lumbar region: Secondary | ICD-10-CM | POA: Diagnosis not present

## 2021-12-29 DIAGNOSIS — M47896 Other spondylosis, lumbar region: Secondary | ICD-10-CM | POA: Diagnosis not present

## 2021-12-29 DIAGNOSIS — M545 Low back pain, unspecified: Secondary | ICD-10-CM | POA: Diagnosis not present

## 2021-12-29 DIAGNOSIS — G894 Chronic pain syndrome: Secondary | ICD-10-CM | POA: Diagnosis not present

## 2022-01-26 DIAGNOSIS — G894 Chronic pain syndrome: Secondary | ICD-10-CM | POA: Diagnosis not present

## 2022-01-26 DIAGNOSIS — M5106 Intervertebral disc disorders with myelopathy, lumbar region: Secondary | ICD-10-CM | POA: Diagnosis not present

## 2022-01-26 DIAGNOSIS — M545 Low back pain, unspecified: Secondary | ICD-10-CM | POA: Diagnosis not present

## 2022-01-26 DIAGNOSIS — M47896 Other spondylosis, lumbar region: Secondary | ICD-10-CM | POA: Diagnosis not present

## 2022-01-29 ENCOUNTER — Ambulatory Visit (INDEPENDENT_AMBULATORY_CARE_PROVIDER_SITE_OTHER): Payer: Medicare Other | Admitting: Nurse Practitioner

## 2022-01-29 ENCOUNTER — Encounter: Payer: Self-pay | Admitting: Nurse Practitioner

## 2022-01-29 VITALS — BP 128/86 | HR 103 | Temp 97.1°F | Ht 60.0 in | Wt 151.0 lb

## 2022-01-29 DIAGNOSIS — G4733 Obstructive sleep apnea (adult) (pediatric): Secondary | ICD-10-CM

## 2022-01-29 DIAGNOSIS — N182 Chronic kidney disease, stage 2 (mild): Secondary | ICD-10-CM | POA: Diagnosis not present

## 2022-01-29 DIAGNOSIS — E782 Mixed hyperlipidemia: Secondary | ICD-10-CM

## 2022-01-29 DIAGNOSIS — G894 Chronic pain syndrome: Secondary | ICD-10-CM

## 2022-01-29 DIAGNOSIS — J449 Chronic obstructive pulmonary disease, unspecified: Secondary | ICD-10-CM | POA: Diagnosis not present

## 2022-01-29 DIAGNOSIS — I700 Atherosclerosis of aorta: Secondary | ICD-10-CM

## 2022-01-29 DIAGNOSIS — M5136 Other intervertebral disc degeneration, lumbar region: Secondary | ICD-10-CM

## 2022-01-29 DIAGNOSIS — R7309 Other abnormal glucose: Secondary | ICD-10-CM

## 2022-01-29 DIAGNOSIS — I10 Essential (primary) hypertension: Secondary | ICD-10-CM | POA: Diagnosis not present

## 2022-01-29 DIAGNOSIS — Z79899 Other long term (current) drug therapy: Secondary | ICD-10-CM

## 2022-01-29 DIAGNOSIS — E559 Vitamin D deficiency, unspecified: Secondary | ICD-10-CM

## 2022-01-29 DIAGNOSIS — F3341 Major depressive disorder, recurrent, in partial remission: Secondary | ICD-10-CM

## 2022-01-29 NOTE — Patient Instructions (Signed)

## 2022-01-29 NOTE — Progress Notes (Signed)
Assessment and Plan:  Amber Hickman was seen today for a follow up.  Diagnoses and all order for this visit:  Essential hypertension Discussed DASH (Dietary Approaches to Stop Hypertension) DASH diet is lower in sodium than a typical American diet. Cut back on foods that are high in saturated fat, cholesterol, and trans fats. Eat more whole-grain foods, fish, poultry, and nuts Remain active and exercise as tolerated daily.  Monitor BP at home-Call if greater than 130/80.  Check CMP/CBC  Hyperlipidemia, mixed Discussed lifestyle modifications. Recommended diet heavy in fruits and veggies, omega 3's. Decrease consumption of animal meats, cheeses, and dairy products. Remain active and exercise as tolerated. Continue to monitor. Check lipids/TSH   Abnormal glucose Education: Reviewed 'ABCs' of diabetes management  Discussed goals to be met and/or maintained include A1C (<7) Blood pressure (<130/80) Cholesterol (LDL <70) Continue Dental Exam Q6 mo Discussed dietary recommendations Discussed Physical Activity recommendations Check A1C   Vitamin D deficiency Continue supplement Monitor levels  OSA and COPD overlap syndrome (HCC) Continue CPAP  Depression, major, recurrent, in partial remission (HCC) Controlled Continue  medications.  Aortic atherosclerosis ([place]) by Chest CT scan - 04/02/2021 Control blood pressure Control cholesterol  Chronic pain syndrome/DDD lumbar Continue to follow up with Orthopedics Continue Toradol injection  Chronic obstructive pulmonary disease, unspecified COPD type ([place]) Continue Advair.  CKD (chronic kidney disease) stage 2, GFR 60-89 ml/min Discussed how what you eat and drink can aide in kidney protection. Stay well hydrated. Avoid high salt foods. Avoid NSAIDS. Keep BP and BG well controlled.   Take medications as prescribed. Remain active and exercise as tolerated daily. Maintain weight.  Continue to monitor. Check  CMP/GFR/Microablumin  Medication management All medications discussed and reviewed in full. All questions and concerns regarding medications addressed.    Orders Placed This Encounter  Procedures   CBC with Differential/Platelet   COMPLETE METABOLIC PANEL WITH GFR   Lipid panel   Hemoglobin A1c    Notify office for further evaluation and treatment, questions or concerns if any reported s/s fail to improve.   The patient was advised to call back or seek an in-person evaluation if any symptoms worsen or if the condition fails to improve as anticipated.   Further disposition pending results of labs. Discussed med's effects and SE's.    I discussed the assessment and treatment plan with the patient. The patient was provided an opportunity to ask questions and all were answered. The patient agreed with the plan and demonstrated an understanding of the instructions.  Discussed med's effects and SE's. Screening labs and tests as requested with regular follow-up as recommended.  I provided 20 minutes of face-to-face time during this encounter including counseling, chart review, and critical decision making was preformed.     Future Appointments  Date Time Provider [place]  07/19/2022 10:00 AM Darrol Jump, NP [facility] None  11/03/2022 11:00 AM Unk Pinto, MD [facility] None    ------------------------------------------------------------------------------------------------------------------   HPI BP 128/86   Pulse (!) 103   Temp (!) 97.1 F (36.2 C)   Ht 5' (1.524 m)   Wt 151 lb (68.5 kg)   SpO2 94%   BMI 29.49 kg/m   Overall she reports feeling well today.    She fell on 08/05/21 with an ER visit d/t hypotension secondary to a change in her anti-hypertensive medication, Losartan.    During her ED visit she had a closed displaced fracture of lateral malleolus of left fibula.  She presents with left  hard plastic boot in place.  She reports following up with  Dr. Percell Miller with worsening fracture, therefore she will have  surgery tomorrow 08/19/21.   BMI is Body mass index is 29.49 kg/m., she has not been working on diet and exercise. Wt Readings from Last 3 Encounters:  01/29/22 151 lb (68.5 kg)  10/30/21 143 lb 9.6 oz (65.1 kg)  08/19/21 156 lb (70.8 kg)    Her blood pressure has been controlled at home, today their BP is BP: 128/86  She does not workout. She denies chest pain, shortness of breath, dizziness.   She is on cholesterol medication Rosuvastatin and denies myalgias. Her cholesterol is at goal. The cholesterol last visit was:   Lab Results  Component Value Date   CHOL 144 10/30/2021   HDL 76 10/30/2021   LDLCALC 48 10/30/2021   TRIG 113 10/30/2021   CHOLHDL 1.9 10/30/2021    She has not been working on diet and exercise for prediabetes, and denies polydipsia and polyuria. Last A1C in the office was:  Lab Results  Component Value Date   HGBA1C 5.8 (H) 10/30/2021   Patient is on Vitamin D supplement.   Lab Results  Component Value Date   VD25OH 84 10/30/2021        Past Medical History:  Diagnosis Date   Allergy    Arthritis    AVN (avascular necrosis of bone) (HCC)    Left shoulder   COPD (chronic obstructive pulmonary disease) (HCC)    Depression    GERD (gastroesophageal reflux disease)    Hyperlipidemia    Hypertension    Memory disorder 12/23/2016   Osteoporosis    TIA (transient ischemic attack) 09/30/2017   Vitamin D deficiency      Allergies  Allergen Reactions   Augmentin [Amoxicillin-Pot Clavulanate] Nausea And Vomiting   Codeine Nausea And Vomiting   Zoloft [Sertraline Hcl]     Pt does not recall reaction    Lipitor [Atorvastatin] Other (See Comments)    Leg pain    Current Outpatient Medications on File Prior to Visit  Medication Sig   alendronate (FOSAMAX) 70 MG tablet Take 1 tablet (70 mg total) by mouth every 7 (seven) days. Take with a full glass of water on an empty stomach.    baclofen (LIORESAL) 20 MG tablet Take 20 mg by mouth 3 (three) times daily as needed for muscle spasms.   cholecalciferol 5000 units TABS Take 2 tablets daily   DULoxetine (CYMBALTA) 60 MG capsule Take  1 capsule  Daily  for Chronic Pain                                       /                       TAKE              BY              MOUTH   ezetimibe (ZETIA) 10 MG tablet Take 1 tablet (10 mg total) by mouth daily.   fluticasone-salmeterol (ADVAIR) 500-50 MCG/ACT AEPB Use 1 inhalation 2 x /day ( every 12 hours ) for COPD /Asthma                           /  Inhale              into Lungs   gabapentin (NEURONTIN) 800 MG tablet Take  1 table t 3 x /day  for Chronic Pain                                                                    /                              TAKE  BY MOUTH   hydrochlorothiazide (HYDRODIURIL) 25 MG tablet Take 1 tablet daily for BP and fluid   ketotifen (ZADITOR) 0.025 % ophthalmic solution Place 1 drop into both eyes 3 (three) times daily as needed (allergies).   olmesartan (BENICAR) 20 MG tablet TAKE 1 TABLET BY MOUTH DAILY   oxyCODONE (ROXICODONE) 15 MG immediate release tablet Take 15 mg by mouth every 6 (six) hours as needed for pain.    pantoprazole (PROTONIX) 40 MG tablet Take 1 tablet (40 mg total) by mouth daily.   rosuvastatin (CRESTOR) 40 MG tablet Take 1 tab daily for cholesterol.   topiramate (TOPAMAX) 25 MG tablet Take  1 tablet  2 x /evening  at [employer] & Bed[employer]  for Dieting & Weight Loss                                /                                     TAKE                               BY                                MOUTH   No current facility-administered medications on file prior to visit.    ROS: all negative except what is noted in the HPI.    Physical Exam:  BP 128/86   Pulse (!) 103   Temp (!) 97.1 F (36.2 C)   Ht 5' (1.524 m)   Wt 151 lb (68.5 kg)   SpO2 94%   BMI 29.49 kg/m   General Appearance: NAD.  Awake,  conversant and cooperative. Eyes: PERRLA, EOMs intact.  Sclera white.  Conjunctiva without erythema. Sinuses: No frontal/maxillary tenderness.  No nasal discharge. Nares patent.  ENT/Mouth: Ext aud canals clear.  Bilateral TMs w/DOL and without erythema or bulging. Hearing intact.  Posterior pharynx without swelling or exudate.  Tonsils without swelling or erythema.  Neck: Supple.  No masses, nodules or thyromegaly. Respiratory: Effort is regular with non-labored breathing. Breath sounds are equal bilaterally without rales, rhonchi, wheezing or stridor.  Cardio: RRR with no MRGs. Brisk peripheral pulses without edema.  Abdomen: Active BS in all four quadrants.  Soft and non-tender without guarding, rebound tenderness, hernias or masses. Lymphatics: Non tender without lymphadenopathy.  Musculoskeletal: Full ROM, 5/5 strength, normal ambulation.  No clubbing or cyanosis. Skin:  Appropriate color for ethnicity. Warm without rashes, lesions, ecchymosis, ulcers.  Neuro: CN II-XII grossly normal. Normal muscle tone without cerebellar symptoms and intact sensation.   Psych: AO X 3,  appropriate mood and affect, insight and judgment.     Darrol Jump, NP 11:50 AM [facility]

## 2022-01-30 LAB — CBC WITH DIFFERENTIAL/PLATELET
Absolute Monocytes: 415 cells/uL (ref 200–950)
Basophils Absolute: 27 cells/uL (ref 0–200)
Basophils Relative: 0.4 %
Eosinophils Absolute: 41 cells/uL (ref 15–500)
Eosinophils Relative: 0.6 %
HCT: 44 % (ref 35.0–45.0)
Hemoglobin: 14.6 g/dL (ref 11.7–15.5)
Lymphs Abs: 1870 cells/uL (ref 850–3900)
MCH: 31 pg (ref 27.0–33.0)
MCHC: 33.2 g/dL (ref 32.0–36.0)
MCV: 93.4 fL (ref 80.0–100.0)
MPV: 11.6 fL (ref 7.5–12.5)
Monocytes Relative: 6.1 %
Neutro Abs: 4447 cells/uL (ref 1500–7800)
Neutrophils Relative %: 65.4 %
Platelets: 236 10*3/uL (ref 140–400)
RBC: 4.71 10*6/uL (ref 3.80–5.10)
RDW: 12.5 % (ref 11.0–15.0)
Total Lymphocyte: 27.5 %
WBC: 6.8 10*3/uL (ref 3.8–10.8)

## 2022-01-30 LAB — LIPID PANEL
Cholesterol: 126 mg/dL (ref <200)
HDL: 63 mg/dL (ref >=50)
LDL Cholesterol (Calc): 46 mg/dL (calc)
Non-HDL Cholesterol (Calc): 63 mg/dL (calc) (ref <130)
Total CHOL/HDL Ratio: 2 (calc) (ref <5.0)
Triglycerides: 83 mg/dL (ref <150)

## 2022-01-30 LAB — COMPLETE METABOLIC PANEL WITH GFR
AG Ratio: 1.7 (calc) (ref 1.0–2.5)
ALT: 40 U/L — ABNORMAL HIGH (ref 6–29)
AST: 35 U/L (ref 10–35)
Albumin: 4.7 g/dL (ref 3.6–5.1)
Alkaline phosphatase (APISO): 64 U/L (ref 37–153)
BUN: 22 mg/dL (ref 7–25)
CO2: 25 mmol/L (ref 20–32)
Calcium: 10.2 mg/dL (ref 8.6–10.4)
Chloride: 109 mmol/L (ref 98–110)
Creat: 0.94 mg/dL (ref 0.50–1.05)
Globulin: 2.7 g/dL (calc) (ref 1.9–3.7)
Glucose, Bld: 101 mg/dL — ABNORMAL HIGH (ref 65–99)
Potassium: 4.6 mmol/L (ref 3.5–5.3)
Sodium: 141 mmol/L (ref 135–146)
Total Bilirubin: 0.5 mg/dL (ref 0.2–1.2)
Total Protein: 7.4 g/dL (ref 6.1–8.1)
eGFR: 67 mL/min/{1.73_m2} (ref >=60)

## 2022-01-30 LAB — HEMOGLOBIN A1C
Hgb A1c MFr Bld: 6.1 % of total Hgb — ABNORMAL HIGH (ref <5.7)
Mean Plasma Glucose: 128 mg/dL
eAG (mmol/L): 7.1 mmol/L

## 2022-02-17 ENCOUNTER — Other Ambulatory Visit: Payer: Self-pay | Admitting: Internal Medicine

## 2022-02-17 DIAGNOSIS — Z1231 Encounter for screening mammogram for malignant neoplasm of breast: Secondary | ICD-10-CM

## 2022-02-18 ENCOUNTER — Telehealth: Payer: Self-pay | Admitting: Nurse Practitioner

## 2022-02-18 NOTE — Telephone Encounter (Signed)
Wanting to get set up for a CT Lung Cancer Screening, didn't have it done in 2023

## 2022-02-21 ENCOUNTER — Other Ambulatory Visit: Payer: Self-pay | Admitting: Nurse Practitioner

## 2022-02-21 DIAGNOSIS — J449 Chronic obstructive pulmonary disease, unspecified: Secondary | ICD-10-CM

## 2022-02-21 DIAGNOSIS — Z87891 Personal history of nicotine dependence: Secondary | ICD-10-CM

## 2022-03-02 DIAGNOSIS — G894 Chronic pain syndrome: Secondary | ICD-10-CM | POA: Diagnosis not present

## 2022-03-02 DIAGNOSIS — M47896 Other spondylosis, lumbar region: Secondary | ICD-10-CM | POA: Diagnosis not present

## 2022-03-02 DIAGNOSIS — M5106 Intervertebral disc disorders with myelopathy, lumbar region: Secondary | ICD-10-CM | POA: Diagnosis not present

## 2022-03-02 DIAGNOSIS — M545 Low back pain, unspecified: Secondary | ICD-10-CM | POA: Diagnosis not present

## 2022-03-04 ENCOUNTER — Telehealth: Payer: Self-pay

## 2022-03-04 ENCOUNTER — Other Ambulatory Visit: Payer: Self-pay | Admitting: Nurse Practitioner

## 2022-03-04 DIAGNOSIS — Z87891 Personal history of nicotine dependence: Secondary | ICD-10-CM

## 2022-03-04 DIAGNOSIS — Z122 Encounter for screening for malignant neoplasm of respiratory organs: Secondary | ICD-10-CM

## 2022-03-04 NOTE — Telephone Encounter (Signed)
Patient is inquiring about her referral for a CT.  Spoke with Katrina and she states that the location and diagnosis code need to be changed.  COPD needs to be deleted, adding a different diagnosi, and the location needs to be at [address].

## 2022-03-12 ENCOUNTER — Telehealth: Payer: Self-pay | Admitting: Nurse Practitioner

## 2022-03-12 DIAGNOSIS — E782 Mixed hyperlipidemia: Secondary | ICD-10-CM

## 2022-03-12 MED ORDER — ROSUVASTATIN CALCIUM 40 MG PO TABS: ORAL_TABLET | ORAL | 3 refills | Status: DC

## 2022-03-12 NOTE — Telephone Encounter (Signed)
Pt is needing a refill on Rosuvastatin to [facility] on [address]

## 2022-03-12 NOTE — Addendum Note (Signed)
Addended by: Chancy Hurter on: 03/12/2022 10:54 AM   Modules accepted: Orders

## 2022-03-16 DIAGNOSIS — Z961 Presence of intraocular lens: Secondary | ICD-10-CM | POA: Diagnosis not present

## 2022-03-16 DIAGNOSIS — D3132 Benign neoplasm of left choroid: Secondary | ICD-10-CM | POA: Diagnosis not present

## 2022-03-16 DIAGNOSIS — H43813 Vitreous degeneration, bilateral: Secondary | ICD-10-CM | POA: Diagnosis not present

## 2022-03-30 DIAGNOSIS — G894 Chronic pain syndrome: Secondary | ICD-10-CM | POA: Diagnosis not present

## 2022-03-30 DIAGNOSIS — Z79899 Other long term (current) drug therapy: Secondary | ICD-10-CM | POA: Diagnosis not present

## 2022-03-30 DIAGNOSIS — M545 Low back pain, unspecified: Secondary | ICD-10-CM | POA: Diagnosis not present

## 2022-03-30 DIAGNOSIS — M47896 Other spondylosis, lumbar region: Secondary | ICD-10-CM | POA: Diagnosis not present

## 2022-03-30 DIAGNOSIS — Z79891 Long term (current) use of opiate analgesic: Secondary | ICD-10-CM | POA: Diagnosis not present

## 2022-04-08 ENCOUNTER — Ambulatory Visit
Admission: RE | Admit: 2022-04-08 | Discharge: 2022-04-08 | Disposition: A | Discharge status: Home / Self Care | Payer: Medicare Other | Source: Ambulatory Visit | Attending: Nurse Practitioner | Admitting: Nurse Practitioner

## 2022-04-08 ENCOUNTER — Ambulatory Visit
Admission: RE | Admit: 2022-04-08 | Discharge: 2022-04-08 | Disposition: A | Discharge status: Home / Self Care | Payer: Medicare Other | Source: Ambulatory Visit | Attending: Internal Medicine | Admitting: Internal Medicine

## 2022-04-08 ENCOUNTER — Other Ambulatory Visit: Payer: Medicare Other

## 2022-04-08 DIAGNOSIS — Z122 Encounter for screening for malignant neoplasm of respiratory organs: Secondary | ICD-10-CM

## 2022-04-08 DIAGNOSIS — Z87891 Personal history of nicotine dependence: Secondary | ICD-10-CM

## 2022-04-08 DIAGNOSIS — Z1231 Encounter for screening mammogram for malignant neoplasm of breast: Secondary | ICD-10-CM

## 2022-04-08 DIAGNOSIS — I2510 Atherosclerotic heart disease of native coronary artery without angina pectoris: Secondary | ICD-10-CM | POA: Diagnosis not present

## 2022-04-08 DIAGNOSIS — J432 Centrilobular emphysema: Secondary | ICD-10-CM | POA: Diagnosis not present

## 2022-04-08 DIAGNOSIS — S2241XA Multiple fractures of ribs, right side, initial encounter for closed fracture: Secondary | ICD-10-CM | POA: Diagnosis not present

## 2022-04-28 DIAGNOSIS — M545 Low back pain, unspecified: Secondary | ICD-10-CM | POA: Diagnosis not present

## 2022-04-28 DIAGNOSIS — G894 Chronic pain syndrome: Secondary | ICD-10-CM | POA: Diagnosis not present

## 2022-04-28 DIAGNOSIS — M47896 Other spondylosis, lumbar region: Secondary | ICD-10-CM | POA: Diagnosis not present

## 2022-05-03 ENCOUNTER — Encounter: Payer: Self-pay | Admitting: Nurse Practitioner

## 2022-05-03 ENCOUNTER — Ambulatory Visit (INDEPENDENT_AMBULATORY_CARE_PROVIDER_SITE_OTHER): Payer: Medicare Other | Admitting: Nurse Practitioner

## 2022-05-03 VITALS — BP 108/72 | HR 91 | Temp 97.7°F | Ht 60.0 in | Wt 151.0 lb

## 2022-05-03 DIAGNOSIS — I10 Essential (primary) hypertension: Secondary | ICD-10-CM

## 2022-05-03 DIAGNOSIS — E559 Vitamin D deficiency, unspecified: Secondary | ICD-10-CM

## 2022-05-03 DIAGNOSIS — Z79899 Other long term (current) drug therapy: Secondary | ICD-10-CM | POA: Diagnosis not present

## 2022-05-03 DIAGNOSIS — R7309 Other abnormal glucose: Secondary | ICD-10-CM | POA: Diagnosis not present

## 2022-05-03 DIAGNOSIS — Z0001 Encounter for general adult medical examination with abnormal findings: Secondary | ICD-10-CM

## 2022-05-03 DIAGNOSIS — M8700 Idiopathic aseptic necrosis of unspecified bone: Secondary | ICD-10-CM

## 2022-05-03 DIAGNOSIS — E782 Mixed hyperlipidemia: Secondary | ICD-10-CM | POA: Diagnosis not present

## 2022-05-03 DIAGNOSIS — J449 Chronic obstructive pulmonary disease, unspecified: Secondary | ICD-10-CM

## 2022-05-03 DIAGNOSIS — E669 Obesity, unspecified: Secondary | ICD-10-CM

## 2022-05-03 DIAGNOSIS — M810 Age-related osteoporosis without current pathological fracture: Secondary | ICD-10-CM

## 2022-05-03 DIAGNOSIS — Z86010 Personal history of colonic polyps: Secondary | ICD-10-CM

## 2022-05-03 DIAGNOSIS — N182 Chronic kidney disease, stage 2 (mild): Secondary | ICD-10-CM

## 2022-05-03 DIAGNOSIS — R6889 Other general symptoms and signs: Secondary | ICD-10-CM

## 2022-05-03 DIAGNOSIS — G4733 Obstructive sleep apnea (adult) (pediatric): Secondary | ICD-10-CM | POA: Diagnosis not present

## 2022-05-03 DIAGNOSIS — I2510 Atherosclerotic heart disease of native coronary artery without angina pectoris: Secondary | ICD-10-CM

## 2022-05-03 DIAGNOSIS — I700 Atherosclerosis of aorta: Secondary | ICD-10-CM

## 2022-05-03 DIAGNOSIS — F3341 Major depressive disorder, recurrent, in partial remission: Secondary | ICD-10-CM

## 2022-05-03 DIAGNOSIS — G894 Chronic pain syndrome: Secondary | ICD-10-CM

## 2022-05-03 DIAGNOSIS — Z87891 Personal history of nicotine dependence: Secondary | ICD-10-CM

## 2022-05-03 DIAGNOSIS — Z0000 Encounter for general adult medical examination without abnormal findings: Secondary | ICD-10-CM

## 2022-05-03 NOTE — Progress Notes (Signed)
MEDICARE ANNUAL WELLNESS VISIT AND 3 FOLLOW UP Assessment:    Annual Medicare Wellness Visit Due annually  Health maintenance reviewed  Coronary atherosclerosis of native coronary artery/Atherosclerosis of aorta ([place]) - CT 04/02/2021 Control blood pressure, cholesterol (LDL <70), glucose, increase exercise. Cardiology follows, continue statin, ASA.  Disscused lifestyle modifications, diet & exercise Continue to monitor  Essential hypertension Controlled Discussed DASH (Dietary Approaches to Stop Hypertension) DASH diet is lower in sodium than a typical American diet. Cut back on foods that are high in saturated fat, cholesterol, and trans fats. Eat more whole-grain foods, fish, poultry, and nuts Remain active and exercise as tolerated daily.  Monitor BP at home-Call if greater than 130/80.  Check CMP/CBC  Chronic obstructive pulmonary disease, unspecified COPD type ([place]) Lung CT Scan completed 03/2022 - Benign.  Re-screen 1 year.    OSA and COPD overlap syndrome (HCC) Has CPAP - Discussed using regularly Weight loss encouraged  CKD (chronic kidney disease) stage 2, GFR 60-89 ml/min Discussed how what you eat and drink can aide in kidney protection. Stay well hydrated. Avoid high salt foods. Avoid NSAIDS. Keep BP and BG well controlled.   Take medications as prescribed. Remain active and exercise as tolerated daily. Maintain weight.  Continue to monitor. Check CMP/GFR/Microablumin  Depression, major, recurrent, in partial remission ([place]) Continue cymbalta for mood and pain benefit Discussed stress management techniques  Discussed, increase water,intake & good sleep hygiene  Discussed increasing exercise & vegetables in diet  Chronic pain syndrome DDD, lumbar Continue ortho/pain management follow up, Dr. Patrice Paradise  Former Tobacco use disorder Low dose CT lung cancer screening UTD,  Other abnormal glucose (prediabetes) Education: Reviewed 'ABCs' of diabetes management   Discussed goals to be met and/or maintained include A1C (<7) Blood pressure (<130/80) Cholesterol (LDL <70) Continue Eye Exam yearly  Continue Dental Exam Q6 mo Discussed dietary recommendations Discussed Physical Activity recommendations Check A1C  Mixed hyperlipidemia Discussed lifestyle modifications. Recommended diet heavy in fruits and veggies, omega 3's. Decrease consumption of animal meats, cheeses, and dairy products. Remain active and exercise as tolerated. Continue to monitor. Check lipids/TSH  Vitamin D deficiency Continue supplementation for goal of 60-100 Monitor levels  Medication management All medications discussed and reviewed in full. All questions and concerns regarding medications addressed.    History of TIA Neurology, Dr. Jannifer Franklin following  Obesity - BMI 31 Discussed appropriate BMI Diet modification. Physical activity. Encouraged/praised to build confidence.  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 Advised on fall prevention measures including proper lighting in all rooms, removal of area rugs and floor clutter, use of walking devices as deemed appropriate, avoidance of uneven walking surfaces. Smoking cessation and moderate alcohol consumption if applicable Consume [id number] to 1000 IU of vitamin D daily with a goal vitamin D serum value of 30 ng/mL or higher. Aim for 1000 to 1200 mg of elemental calcium daily through supplements and/or dietary sources.  Orders Placed This Encounter  Procedures   DG Bone Density    Standing Status:   Future    Standing Expiration Date:   05/03/2023    Order Specific Question:   Reason for Exam (SYMPTOM  OR DIAGNOSIS REQUIRED)    Answer:   Osteoporosis biannual screening    Order Specific Question:   Preferred imaging  location?    Answer:  [facility]   CBC with Differential/Platelet   COMPLETE METABOLIC PANEL WITH GFR   Lipid panel   Hemoglobin A1c   VITAMIN D 25 Hydroxy (Vit-D Deficiency, Fractures)     Notify office for further evaluation and treatment, questions or concerns if any reported s/s fail to improve.   The patient was advised to call back or seek an in-person evaluation if any symptoms worsen or if the condition fails to improve as anticipated.   Further disposition pending results of labs. Discussed med's effects and SE's.    I discussed the assessment and treatment plan with the patient. The patient was provided an opportunity to ask questions and all were answered. The patient agreed with the plan and demonstrated an understanding of the instructions.  Discussed med's effects and SE's. Screening labs and tests as requested with regular follow-up as recommended.  I provided 35 minutes of face-to-face time during this encounter including counseling, chart review, and critical decision making was preformed.    Future Appointments  Date Time Provider [place]  08/03/2022 11:30 AM Darrol Jump, NP [facility] None  11/03/2022 11:00 AM Unk Pinto, MD [facility] None  05/05/2023 11:00 AM Darrol Jump, NP [facility] None    Plan:   During the course of the visit the patient was educated and counseled about appropriate screening and preventive services including:   Pneumococcal vaccine  Influenza vaccine Prevnar 13 Td vaccine Screening electrocardiogram Colorectal cancer screening Diabetes screening Glaucoma screening Nutrition counseling    Subjective:  Amber Hickman is a 67 y.o. female who presents for Medicare Annual Wellness Visit and 3 month follow up. She has Hyperlipidemia, mixed; Vitamin D deficiency; COPD (chronic obstructive pulmonary disease) ([place]); Medication management; CKD (chronic kidney disease) stage 2, GFR 60-89 ml/min; DDD,  lumbar; Chronic pain syndrome; Essential hypertension; Depression, major, recurrent, in partial remission ([place]); OSA and COPD overlap syndrome ([place]); Aortic atherosclerosis ([place]) by Chest CT scan - 04/02/2021; Abnormal glucose; Former smoker (30 pack/year, quit 2019); Obesity (BMI 30.0-34.9); History of colon polyps; Osteoporosis; Coronary atherosclerosis of native coronary artery; and Elevated LFTs on their problem list.  Overall she reports feeling well.  She has OSA and is not on CPAP due to intolerance, sleeps well when weight is down, declined referral back to sleep medicine.   She is a former smoker, quit in 2019 with 30 pack year hx.  Low dose CT lung screening 03/2022 benign.  Fused L3 down, follows Dr Patrice Paradise. She sees pain management for her back pain, Dr. Marnee Guarneri, gets injections.  They are prescribing her pain medications (oxycodone 15 mg q6h PRN), takes 1-4/day, recently pain is worse. Has residual RLE weakness/foot drop and calf wasting. Does have some gait instability if she rushes, reports benefited from PT in the past. Also has known AVN bil hips per MRI 2018, patient also reports in shoulders per ortho XR.   She has hx of MDD in remission on cymbalta 60 mg, also for pain benefit.   BMI is Body mass index is 29.49 kg/m., she is working on diet, exercise limited by back pain.  Wt Readings from Last 3 Encounters:  05/03/22 151 lb (68.5 kg)  01/29/22 151 lb (68.5 kg)  10/30/21 143 lb 9.6 oz (65.1 kg)   She had a normal echo 04/2015, normal stress test 04/2015. Aortic atherosclerosis per CT 09/2019. She had CT coronary scan by Dr. Stanford Breed, total calcium score 126, LAD 113, RCA 12, 82 % for age/gender, mild non-obstructive CAD.   Her blood  pressure has been labile, ranging 110s/60s to 150/90s, today their BP is BP: 108/72,   BP Readings from Last 3 Encounters:  05/03/22 108/72  01/29/22 128/86  10/30/21 128/82    She does not workout. She denies chest pain, shortness of breath,  dizziness.   She is on cholesterol medication (rosuvastatin 40 mg and more recently on zetia 10 mg by cardiology) and denies myalgias. Her cholesterol is at goal <70. The cholesterol last visit was:   Lab Results  Component Value Date   CHOL 126 01/29/2022   HDL 63 01/29/2022   LDLCALC 46 01/29/2022   TRIG 83 01/29/2022   CHOLHDL 2.0 01/29/2022    She has been working on diet and exercise for prediabetes, and denies increased appetite, nausea, paresthesia of the feet, polydipsia, polyuria and visual disturbances. Last A1C in the office was:  Lab Results  Component Value Date   HGBA1C 6.1 (H) 01/29/2022   Patient is on Vitamin D supplement.   Lab Results  Component Value Date   VD25OH 84 10/30/2021       Medication Review: Current Outpatient Medications on File Prior to Visit  Medication Sig Dispense Refill   alendronate (FOSAMAX) 70 MG tablet Take 1 tablet (70 mg total) by mouth every 7 (seven) days. Take with a full glass of water on an empty stomach. 12 tablet 3   baclofen (LIORESAL) 20 MG tablet Take 20 mg by mouth 3 (three) times daily as needed for muscle spasms.     cholecalciferol 5000 units TABS Take 2 tablets daily     DULoxetine (CYMBALTA) 60 MG capsule Take  1 capsule  Daily  for Chronic Pain                                       /                       TAKE              BY              MOUTH 90 capsule 3   ezetimibe (ZETIA) 10 MG tablet Take 1 tablet (10 mg total) by mouth daily. 90 tablet 3   fluticasone-salmeterol (ADVAIR) 500-50 MCG/ACT AEPB Use 1 inhalation 2 x /day ( every 12 hours ) for COPD /Asthma                           /                    Inhale              into Lungs 180 each 3   ketotifen (ZADITOR) 0.025 % ophthalmic solution Place 1 drop into both eyes 3 (three) times daily as needed (allergies).     olmesartan (BENICAR) 20 MG tablet TAKE 1 TABLET BY MOUTH DAILY 30 tablet 11   oxyCODONE (ROXICODONE) 15 MG immediate release tablet Take 15 mg by mouth  every 6 (six) hours as needed for pain.   0   rosuvastatin (CRESTOR) 40 MG tablet Take 1 tab daily for cholesterol. 90 tablet 3   topiramate (TOPAMAX) 25 MG tablet Take  1 tablet  2 x /evening  at [employer] & [employer]  for Dieting & Weight Loss                                /  TAKE                               BY                                MOUTH 180 tablet 3   gabapentin (NEURONTIN) 800 MG tablet Take  1 table t 3 x /day  for Chronic Pain                                                                    /                              TAKE  BY MOUTH (Patient not taking: Reported on 05/03/2022) 270 tablet 3   hydrochlorothiazide (HYDRODIURIL) 25 MG tablet Take 1 tablet daily for BP and fluid (Patient not taking: Reported on 05/03/2022) 90 tablet 3   pantoprazole (PROTONIX) 40 MG tablet Take 1 tablet (40 mg total) by mouth daily. (Patient not taking: Reported on 05/03/2022) 30 tablet 0   No current facility-administered medications on file prior to visit.    Allergies: Allergies  Allergen Reactions   Augmentin [Amoxicillin-Pot Clavulanate] Nausea And Vomiting   Codeine Nausea And Vomiting   Zoloft [Sertraline Hcl]     Pt does not recall reaction    Lipitor [Atorvastatin] Other (See Comments)    Leg pain    Current Problems (verified) has Hyperlipidemia, mixed; Vitamin D deficiency; COPD (chronic obstructive pulmonary disease) ([place]); Medication management; CKD (chronic kidney disease) stage 2, GFR 60-89 ml/min; DDD, lumbar; Chronic pain syndrome; Essential hypertension; Depression, major, recurrent, in partial remission ([place]); OSA and COPD overlap syndrome ([place]); Aortic atherosclerosis ([place]) by Chest CT scan - 04/02/2021; Abnormal glucose; Former smoker (30 pack/year, quit 2019); Obesity (BMI 30.0-34.9); History of colon polyps; Osteoporosis; Coronary atherosclerosis of native coronary artery; and Elevated LFTs on their problem list.  Screening  Tests Immunization History  Administered Date(s) Administered   PFIZER(Purple Top)SARS-COV-2 Vaccination 07/02/2019, 07/24/2019   Td 01/19/2004, 06/05/2015   Health Maintenance  Topic Date Due   Zoster Vaccines- Shingrix (1 of 2) Never done   COVID-19 Vaccine (3 - Pfizer risk series) 08/21/2019   Pneumonia Vaccine 35+ Years old (1 of 1 - PCV) 07/17/2022 (Originally 02/13/2020)   Lung Cancer Screening  04/09/2023   Medicare Annual Wellness (AWV)  05/03/2023   MAMMOGRAM  04/08/2024   DTaP/Tdap/Td (3 - Tdap) 06/04/2025   COLONOSCOPY (Pts 45-76yrs Insurance coverage will need to be confirmed)  01/06/2026   DEXA SCAN  Completed   Hepatitis C Screening  Completed   HPV VACCINES  Aged Out   INFLUENZA VACCINE  Discontinued   Last colonoscopy: 09/2017, tubular adenoma, Dr. Earlean Shawl, ? 5 year recall vs 3 year recall due to family hx. She had clear colonoscopy 01/06/2021. Due again 5 years 2027. PAP 2011, was advised none further Mammogram 03/2022 Normal DEXA - 07/2020 T -2.8 L forearm, started on alendronate - Due this year - order placed  CT Lung 03/2022: Benign in appearance or behavior.  Re-screen 1 year.   Influenza: Declined  Pneumococcal:  Due, but declined Prevnar13: Declined Shingles/Zostavax: Declined COVID-19: 2/2, 2021, pfizer   Names of Other Physician/Practitioners you currently use: 1. [facility] here for primary care 2. Dr. Delman Cheadle, eye doctor, last visit 02/2021 3. Dr.Saraf , dentist, last visit 06/2021  Patient Care Team: Unk Pinto, MD as PCP - General (Internal Medicine) Stanford Breed Denice Bors, MD as PCP - Cardiology (Cardiology) Richmond Campbell, MD as Consulting Physician (Gastroenterology) Delfina Redwood as Referring Physician (Physician Assistant) Mammography, [facility] (Diagnostic Radiology)  Surgical: She  has a past surgical history that includes Cataract extraction (Bilateral); Spine surgery; Ankle surgery (Right);  Tonsillectomy and adenoidectomy; Back surgery; and Breast excisional biopsy (Right). Family Her family history includes COPD in her mother; Cancer in her mother; Coronary artery disease in her paternal uncle; Heart disease in her father; Hypertension in her mother; Stroke in her mother. Social history  She reports that she quit smoking about 4 years ago. Her smoking use included cigarettes. She has a 30.00 pack-year smoking history. She has never used smokeless tobacco. She reports that she does not drink alcohol and does not use drugs.  MEDICARE WELLNESS OBJECTIVES: Physical activity: Exercise limited by: neurologic condition(s);orthopedic condition(s) Cardiac risk factors: Cardiac Risk Factors include: advanced age (>59men, >48 women);dyslipidemia;hypertension;obesity (BMI >30kg/m2) Depression/mood screen:      05/03/2022    1:18 PM  Depression screen PHQ 2/9  Decreased Interest 0  Down, Depressed, Hopeless 0  PHQ - 2 Score 0    ADLs:     05/03/2022    1:18 PM 10/31/2021    8:05 PM  In your present state of health, do you have any difficulty performing the following activities:  Hearing? 0 0  Vision? 0 0  Difficulty concentrating or making decisions? 0 0  Walking or climbing stairs? 0 0  Dressing or bathing?  0  Doing errands, shopping? 0 0  Preparing Food and eating ? N   Using the Toilet? N   In the past six months, have you accidently leaked urine? N   Do you have problems with loss of bowel control? N   Managing your Medications? N   Managing your Finances? N   Housekeeping or managing your Housekeeping? N      Cognitive Testing  Alert? Yes  Normal Appearance?Yes  Oriented to person? Yes  Place? Yes   Time? Yes  Recall of three objects?  Yes  Can perform simple calculations? Yes  Displays appropriate judgment?Yes  Can read the correct time from a watch face?Yes  EOL planning: Does Patient Have a Medical Advance Directive?: Yes   Objective:   Today's Vitals    05/03/22 1142  BP: 108/72  Pulse: 91  Temp: 97.7 F (36.5 C)  SpO2: 99%  Weight: 151 lb (68.5 kg)  Height: 5' (1.524 m)   Body mass index is 29.49 kg/m.  General appearance: alert, no distress, WD/WN, female HEENT: normocephalic, sclerae anicteric, TMs pearly, nares patent, no discharge or erythema, pharynx normal Oral cavity: MMM, no lesions Neck: supple, no lymphadenopathy, no thyromegaly, no masses Heart: RRR, normal S1, S2, no murmurs Lungs: CTA bilaterally, no wheezes, rhonchi, or rales Abdomen: +bs, soft, non tender, non distended, no masses, no hepatomegaly, no splenomegaly Musculoskeletal: nontender, no swelling, does have obvious atrophy to calf.  Tenderness to bilateral lumbar, no CVA tenderness noted.  Extremities: no edema, no cyanosis, no clubbing, right leg with some atrophy and decrease muscle compared to left.  Pulses: 2+ symmetric, upper extremities, RLE  thready pulses and cold lower leg, does blanch/mildly slow cap refill. No wounds. Neurological: alert, oriented x 3, CN2-12 intact, strength normal upper extremities Weakness with ankle extension R.  Psychiatric: normal affect, behavior normal, pleasant   Medicare Attestation I have personally reviewed: The patient's medical and social history Their use of alcohol, tobacco or illicit drugs Their current medications and supplements The patient's functional ability including ADLs,fall risks, home safety risks, cognitive, and hearing and visual impairment Diet and physical activities Evidence for depression or mood disorders  The patient's weight, height, BMI, and visual acuity have been recorded in the chart.  I have made referrals, counseling, and provided education to the patient based on review of the above and I have provided the patient with a written personalized care plan for preventive services.     Darrol Jump, NP   05/03/2022

## 2022-05-04 ENCOUNTER — Encounter: Payer: Self-pay | Admitting: Nurse Practitioner

## 2022-05-04 LAB — HEMOGLOBIN A1C
Hgb A1c MFr Bld: 6.1 % of total Hgb — ABNORMAL HIGH (ref <5.7)
Mean Plasma Glucose: 128 mg/dL
eAG (mmol/L): 7.1 mmol/L

## 2022-05-04 LAB — CBC WITH DIFFERENTIAL/PLATELET
Absolute Monocytes: 540 cells/uL (ref 200–950)
Basophils Absolute: 37 cells/uL (ref 0–200)
Basophils Relative: 0.5 %
Eosinophils Absolute: 146 cells/uL (ref 15–500)
Eosinophils Relative: 2 %
HCT: 42.5 % (ref 35.0–45.0)
Hemoglobin: 13.9 g/dL (ref 11.7–15.5)
Lymphs Abs: 2643 cells/uL (ref 850–3900)
MCH: 31 pg (ref 27.0–33.0)
MCHC: 32.7 g/dL (ref 32.0–36.0)
MCV: 94.7 fL (ref 80.0–100.0)
MPV: 11.5 fL (ref 7.5–12.5)
Monocytes Relative: 7.4 %
Neutro Abs: 3935 cells/uL (ref 1500–7800)
Neutrophils Relative %: 53.9 %
Platelets: 225 10*3/uL (ref 140–400)
RBC: 4.49 10*6/uL (ref 3.80–5.10)
RDW: 12 % (ref 11.0–15.0)
Total Lymphocyte: 36.2 %
WBC: 7.3 10*3/uL (ref 3.8–10.8)

## 2022-05-04 LAB — LIPID PANEL
Cholesterol: 112 mg/dL (ref <200)
HDL: 61 mg/dL (ref >=50)
LDL Cholesterol (Calc): 37 mg/dL (calc)
Non-HDL Cholesterol (Calc): 51 mg/dL (calc) (ref <130)
Total CHOL/HDL Ratio: 1.8 (calc) (ref <5.0)
Triglycerides: 59 mg/dL (ref <150)

## 2022-05-04 LAB — COMPLETE METABOLIC PANEL WITH GFR
AG Ratio: 1.7 (calc) (ref 1.0–2.5)
ALT: 67 U/L — ABNORMAL HIGH (ref 6–29)
AST: 49 U/L — ABNORMAL HIGH (ref 10–35)
Albumin: 4.5 g/dL (ref 3.6–5.1)
Alkaline phosphatase (APISO): 72 U/L (ref 37–153)
BUN/Creatinine Ratio: 38 (calc) — ABNORMAL HIGH (ref 6–22)
BUN: 31 mg/dL — ABNORMAL HIGH (ref 7–25)
CO2: 26 mmol/L (ref 20–32)
Calcium: 10.6 mg/dL — ABNORMAL HIGH (ref 8.6–10.4)
Chloride: 109 mmol/L (ref 98–110)
Creat: 0.82 mg/dL (ref 0.50–1.05)
Globulin: 2.7 g/dL (calc) (ref 1.9–3.7)
Glucose, Bld: 85 mg/dL (ref 65–99)
Potassium: 5 mmol/L (ref 3.5–5.3)
Sodium: 142 mmol/L (ref 135–146)
Total Bilirubin: 0.4 mg/dL (ref 0.2–1.2)
Total Protein: 7.2 g/dL (ref 6.1–8.1)
eGFR: 78 mL/min/{1.73_m2} (ref >=60)

## 2022-05-04 LAB — VITAMIN D 25 HYDROXY (VIT D DEFICIENCY, FRACTURES): Vit D, 25-Hydroxy: 82 ng/mL (ref 30–100)

## 2022-05-26 DIAGNOSIS — M545 Low back pain, unspecified: Secondary | ICD-10-CM | POA: Diagnosis not present

## 2022-05-26 DIAGNOSIS — M47896 Other spondylosis, lumbar region: Secondary | ICD-10-CM | POA: Diagnosis not present

## 2022-05-26 DIAGNOSIS — M5106 Intervertebral disc disorders with myelopathy, lumbar region: Secondary | ICD-10-CM | POA: Diagnosis not present

## 2022-05-26 DIAGNOSIS — M4326 Fusion of spine, lumbar region: Secondary | ICD-10-CM | POA: Diagnosis not present

## 2022-05-26 DIAGNOSIS — G894 Chronic pain syndrome: Secondary | ICD-10-CM | POA: Diagnosis not present

## 2022-05-28 ENCOUNTER — Other Ambulatory Visit: Payer: Self-pay | Admitting: Orthopedic Surgery

## 2022-05-28 DIAGNOSIS — M47896 Other spondylosis, lumbar region: Secondary | ICD-10-CM

## 2022-05-28 DIAGNOSIS — M5106 Intervertebral disc disorders with myelopathy, lumbar region: Secondary | ICD-10-CM

## 2022-06-07 ENCOUNTER — Encounter: Payer: Self-pay | Admitting: Orthopedic Surgery

## 2022-06-10 ENCOUNTER — Other Ambulatory Visit: Payer: Self-pay | Admitting: Internal Medicine

## 2022-06-10 DIAGNOSIS — J4531 Mild persistent asthma with (acute) exacerbation: Secondary | ICD-10-CM

## 2022-06-14 ENCOUNTER — Telehealth: Payer: Self-pay | Admitting: Nurse Practitioner

## 2022-06-14 ENCOUNTER — Other Ambulatory Visit: Payer: Self-pay | Admitting: Nurse Practitioner

## 2022-06-14 DIAGNOSIS — E7800 Pure hypercholesterolemia, unspecified: Secondary | ICD-10-CM

## 2022-06-14 MED ORDER — EZETIMIBE 10 MG PO TABS
10.0000 mg | ORAL_TABLET | Freq: Every day | ORAL | 3 refills | Status: AC
Start: 2022-06-14 — End: 2023-02-03

## 2022-06-14 NOTE — Telephone Encounter (Signed)
Patient is requesting a refill on Ezetimibe to [facility] on [address]

## 2022-07-01 DIAGNOSIS — G894 Chronic pain syndrome: Secondary | ICD-10-CM | POA: Diagnosis not present

## 2022-07-01 DIAGNOSIS — M545 Low back pain, unspecified: Secondary | ICD-10-CM | POA: Diagnosis not present

## 2022-07-01 DIAGNOSIS — M47896 Other spondylosis, lumbar region: Secondary | ICD-10-CM | POA: Diagnosis not present

## 2022-07-19 ENCOUNTER — Ambulatory Visit: Payer: Medicare Other | Admitting: Nurse Practitioner

## 2022-07-29 DIAGNOSIS — M47896 Other spondylosis, lumbar region: Secondary | ICD-10-CM | POA: Diagnosis not present

## 2022-07-29 DIAGNOSIS — G894 Chronic pain syndrome: Secondary | ICD-10-CM | POA: Diagnosis not present

## 2022-07-29 DIAGNOSIS — M545 Low back pain, unspecified: Secondary | ICD-10-CM | POA: Diagnosis not present

## 2022-08-03 ENCOUNTER — Other Ambulatory Visit: Payer: Self-pay | Admitting: Nurse Practitioner

## 2022-08-03 ENCOUNTER — Encounter: Payer: Self-pay | Admitting: Nurse Practitioner

## 2022-08-03 ENCOUNTER — Ambulatory Visit (INDEPENDENT_AMBULATORY_CARE_PROVIDER_SITE_OTHER): Payer: Medicare Other | Admitting: Nurse Practitioner

## 2022-08-03 VITALS — BP 128/80 | HR 60 | Temp 97.6°F | Ht 60.0 in | Wt 150.0 lb

## 2022-08-03 DIAGNOSIS — Z79899 Other long term (current) drug therapy: Secondary | ICD-10-CM | POA: Diagnosis not present

## 2022-08-03 DIAGNOSIS — Z87891 Personal history of nicotine dependence: Secondary | ICD-10-CM

## 2022-08-03 DIAGNOSIS — R1312 Dysphagia, oropharyngeal phase: Secondary | ICD-10-CM

## 2022-08-03 DIAGNOSIS — M8700 Idiopathic aseptic necrosis of unspecified bone: Secondary | ICD-10-CM

## 2022-08-03 DIAGNOSIS — J449 Chronic obstructive pulmonary disease, unspecified: Secondary | ICD-10-CM | POA: Diagnosis not present

## 2022-08-03 DIAGNOSIS — G894 Chronic pain syndrome: Secondary | ICD-10-CM | POA: Diagnosis not present

## 2022-08-03 DIAGNOSIS — I10 Essential (primary) hypertension: Secondary | ICD-10-CM

## 2022-08-03 DIAGNOSIS — I2510 Atherosclerotic heart disease of native coronary artery without angina pectoris: Secondary | ICD-10-CM

## 2022-08-03 DIAGNOSIS — R09A2 Foreign body sensation, throat: Secondary | ICD-10-CM

## 2022-08-03 DIAGNOSIS — F3341 Major depressive disorder, recurrent, in partial remission: Secondary | ICD-10-CM

## 2022-08-03 DIAGNOSIS — M810 Age-related osteoporosis without current pathological fracture: Secondary | ICD-10-CM

## 2022-08-03 DIAGNOSIS — Z86010 Personal history of colonic polyps: Secondary | ICD-10-CM

## 2022-08-03 DIAGNOSIS — G4733 Obstructive sleep apnea (adult) (pediatric): Secondary | ICD-10-CM | POA: Diagnosis not present

## 2022-08-03 DIAGNOSIS — N182 Chronic kidney disease, stage 2 (mild): Secondary | ICD-10-CM

## 2022-08-03 DIAGNOSIS — R7309 Other abnormal glucose: Secondary | ICD-10-CM | POA: Diagnosis not present

## 2022-08-03 DIAGNOSIS — E782 Mixed hyperlipidemia: Secondary | ICD-10-CM

## 2022-08-03 DIAGNOSIS — E669 Obesity, unspecified: Secondary | ICD-10-CM

## 2022-08-03 DIAGNOSIS — E559 Vitamin D deficiency, unspecified: Secondary | ICD-10-CM

## 2022-08-03 DIAGNOSIS — Z8673 Personal history of transient ischemic attack (TIA), and cerebral infarction without residual deficits: Secondary | ICD-10-CM

## 2022-08-03 LAB — CBC WITH DIFFERENTIAL/PLATELET
Basophils Absolute: 41 cells/uL (ref 0–200)
MPV: 11.2 fL (ref 7.5–12.5)
Neutro Abs: 5428 cells/uL (ref 1500–7800)
WBC: 8.2 10*3/uL (ref 3.8–10.8)

## 2022-08-03 NOTE — Progress Notes (Signed)
FOLLOW UP Assessment:   Coronary atherosclerosis of native coronary artery/Atherosclerosis of aorta (HCC) - CT 04/02/2021 Control blood pressure, cholesterol (LDL <70), glucose, increase exercise. Cardiology follows, continue statin, ASA.  Disscused lifestyle modifications, diet & exercise Continue to monitor  Essential hypertension Controlled Discussed DASH (Dietary Approaches to Stop Hypertension) DASH diet is lower in sodium than a typical American diet. Cut back on foods that are high in saturated fat, cholesterol, and trans fats. Eat more whole-grain foods, fish, poultry, and nuts Remain active and exercise as tolerated daily.  Monitor BP at home-Call if greater than 130/80.  Check CMP/CBC  Chronic obstructive pulmonary disease, unspecified COPD type (HCC) Lung CT Scan completed 03/2022 - Benign.  Re-screen 1 year.    OSA and COPD overlap syndrome (HCC) Has CPAP - Discussed using regularly Weight loss encouraged  CKD (chronic kidney disease) stage 2, GFR 60-89 ml/min Discussed how what you eat and drink can aide in kidney protection. Stay well hydrated. Avoid high salt foods. Avoid NSAIDS. Keep BP and BG well controlled.   Take medications as prescribed. Remain active and exercise as tolerated daily. Maintain weight.  Continue to monitor. Check CMP/GFR/Microablumin  Depression, major, recurrent, in partial remission (HCC) Continue cymbalta for mood and pain benefit Discussed stress management techniques  Discussed, increase water,intake & good sleep hygiene  Discussed increasing exercise & vegetables in diet  Chronic pain syndrome DDD, lumbar Continue ortho/pain management follow up, Dr. Noel Gerold  Former Tobacco use disorder Low dose CT lung cancer screening UTD,  Other abnormal glucose (prediabetes) Education: Reviewed 'ABCs' of diabetes management  Discussed goals to be met and/or maintained include A1C (<7) Blood pressure (<130/80) Cholesterol (LDL  <70) Continue Eye Exam yearly  Continue Dental Exam Q6 mo Discussed dietary recommendations Discussed Physical Activity recommendations Check A1C  Mixed hyperlipidemia Discussed lifestyle modifications. Recommended diet heavy in fruits and veggies, omega 3's. Decrease consumption of animal meats, cheeses, and dairy products. Remain active and exercise as tolerated. Continue to monitor. Check lipids/TSH  Vitamin D deficiency Continue supplementation for goal of 60-100 Monitor levels  Medication management All medications discussed and reviewed in full. All questions and concerns regarding medications addressed.    History of TIA Neurology, Dr. Anne Hahn following  Obesity - BMI 31 Discussed appropriate BMI Diet modification. Physical activity. Encouraged/praised to build confidence.  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 Advised on fall prevention measures including proper lighting in all rooms, removal of area rugs and floor clutter, use of walking devices as deemed appropriate, avoidance of uneven walking surfaces. Smoking cessation and moderate alcohol consumption if applicable Consume 800 to 1000 IU of vitamin D daily with a goal vitamin D serum value of 30 ng/mL or higher. Aim for 1000 to 1200 mg of elemental calcium daily through supplements and/or dietary sources.  Dysphagia/globulus sensation Swallow evaluation ordered Discussed chin tuck method, small bites, chewing food well.  Orders Placed This Encounter  Procedures   DG SWALLOW STUDY OP MEDICARE SPEECH PATH    Standing Status:   Future    Standing Expiration Date:   11/03/2022    Order Specific Question:   Reason for Exam (SYMPTOM  OR DIAGNOSIS REQUIRED)    Answer:   Dysphagia, intermittent foods, pills,  globulus sensation, no choking    Order Specific Question:   Where should  this test be performed?    Answer:   [facility]   CBC with Differential/Platelet   COMPLETE METABOLIC PANEL WITH GFR   Lipid panel   Hemoglobin A1c    Notify office for further evaluation and treatment, questions or concerns if any reported s/s fail to improve.   The patient was advised to call back or seek an in-person evaluation if any symptoms worsen or if the condition fails to improve as anticipated.   Further disposition pending results of labs. Discussed med's effects and SE's.    I discussed the assessment and treatment plan with the patient. The patient was provided an opportunity to ask questions and all were answered. The patient agreed with the plan and demonstrated an understanding of the instructions.  Discussed med's effects and SE's. Screening labs and tests as requested with regular follow-up as recommended.  I provided 20 minutes of face-to-face time during this encounter including counseling, chart review, and critical decision making was preformed.    Future Appointments  Date Time Provider Department Center  11/03/2022 11:00 AM Lucky Cowboy, MD [facility] None  11/05/2022 11:00 AM [facility] DX DEXA 1 [facility]  05/05/2023 11:00 AM Kem Parcher, Archie Patten, NP [facility] None    Plan:   During the course of the visit the patient was educated and counseled about appropriate screening and preventive services including:   Pneumococcal vaccine  Influenza vaccine Prevnar 13 Td vaccine Screening electrocardiogram Colorectal cancer screening Diabetes screening Glaucoma screening Nutrition counseling    Subjective:  Amber Hickman is a 67 y.o. female who presents for a 3 month follow up. She has Hyperlipidemia, mixed; Vitamin D deficiency; COPD (chronic obstructive pulmonary disease) (HCC); Medication management; CKD (chronic kidney disease) stage 2, GFR 60-89 ml/min; DDD,  lumbar; Chronic pain syndrome; Essential hypertension; Depression, major, recurrent, in partial remission (HCC); OSA and COPD overlap syndrome (HCC); Aortic atherosclerosis (HCC) by Chest CT scan - 04/02/2021; Abnormal glucose; Former smoker (30 pack/year, quit 2019); Obesity (BMI 30.0-34.9); History of colon polyps; Osteoporosis; Coronary atherosclerosis of native coronary artery; and Elevated LFTs on their problem list.  Overall she reports feeling well.  She is concerned for the feeling of something stuck in her throat, just behind the jugular notch.  Feeling is not associated with choking.  Happens with foods, liquids and medications.  Onset approximately 6 months ago. Has tried treating with reflux medications Pantoprazole in the past which has been unsuccessful.  She does not have a hx of thyroid disease.   She has OSA and is not on CPAP due to intolerance, sleeps well when weight is down, declined referral back to sleep medicine.   She is a former smoker, quit in 2019 with 30 pack year hx.  Low dose CT lung screening 03/2022 benign.  Fused L3 down, follows Dr Noel Gerold. She sees pain management for her back pain, Dr. Loretha Brasil, gets injections.  They are prescribing her pain medications (oxycodone 15 mg q6h PRN), takes 1-4/day, recently pain is worse. Has residual RLE weakness/foot drop and calf wasting. Does have some gait instability if she rushes, reports benefited from PT in the past. Also has known AVN bil hips per MRI 2018, patient also reports in shoulders per ortho XR.   She has hx of MDD in remission on cymbalta 60 mg, also for pain benefit.   BMI is Body mass index is 29.29 kg/m., she is working on diet, exercise limited by back pain.  Wt Readings from Last 3 Encounters:  08/03/22 150  lb (68 kg)  05/03/22 151 lb (68.5 kg)  01/29/22 151 lb (68.5 kg)   She had a normal echo 04/2015, normal stress test 04/2015. Aortic atherosclerosis per CT 09/2019. She had CT coronary scan by Dr.  Jens Som, total calcium score 126, LAD 113, RCA 12, 82 % for age/gender, mild non-obstructive CAD.   Her blood pressure has been labile, ranging 110s/60s to 150/90s, today their BP is BP: 128/80,   BP Readings from Last 3 Encounters:  08/03/22 128/80  05/03/22 108/72  01/29/22 128/86    She does not workout. She denies chest pain, shortness of breath, dizziness.   She is on cholesterol medication (rosuvastatin 40 mg and more recently on zetia 10 mg by cardiology) and denies myalgias. Her cholesterol is at goal <70. The cholesterol last visit was:   Lab Results  Component Value Date   CHOL 112 05/03/2022   HDL 61 05/03/2022   LDLCALC 37 05/03/2022   TRIG 59 05/03/2022   CHOLHDL 1.8 05/03/2022    She has been working on diet and exercise for prediabetes, and denies increased appetite, nausea, paresthesia of the feet, polydipsia, polyuria and visual disturbances. Last A1C in the office was:  Lab Results  Component Value Date   HGBA1C 6.1 (H) 05/03/2022   Patient is on Vitamin D supplement.   Lab Results  Component Value Date   VD25OH 82 05/03/2022       Medication Review: Current Outpatient Medications on File Prior to Visit  Medication Sig Dispense Refill   baclofen (LIORESAL) 20 MG tablet Take 20 mg by mouth 3 (three) times daily as needed for muscle spasms.     cholecalciferol 5000 units TABS Take 2 tablets daily     DULoxetine (CYMBALTA) 60 MG capsule Take  1 capsule  Daily  for Chronic Pain                                       /                       TAKE              BY              MOUTH 90 capsule 3   ezetimibe (ZETIA) 10 MG tablet Take 1 tablet (10 mg total) by mouth daily. 90 tablet 3   fluticasone-salmeterol (WIXELA INHUB) 500-50 MCG/ACT AEPB Use   1 Inhalation   2 x / day (every 12 hours)  for Asthma 180 each 3   ketotifen (ZADITOR) 0.025 % ophthalmic solution Place 1 drop into both eyes 3 (three) times daily as needed (allergies).     olmesartan (BENICAR) 20 MG  tablet TAKE 1 TABLET BY MOUTH DAILY 30 tablet 11   oxyCODONE (ROXICODONE) 15 MG immediate release tablet Take 15 mg by mouth every 6 (six) hours as needed for pain.   0   rosuvastatin (CRESTOR) 40 MG tablet Take 1 tab daily for cholesterol. 90 tablet 3   gabapentin (NEURONTIN) 800 MG tablet Take  1 table t 3 x /day  for Chronic Pain                                                                    /  TAKE  BY MOUTH (Patient not taking: Reported on 05/03/2022) 270 tablet 3   hydrochlorothiazide (HYDRODIURIL) 25 MG tablet Take 1 tablet daily for BP and fluid (Patient not taking: Reported on 05/03/2022) 90 tablet 3   pantoprazole (PROTONIX) 40 MG tablet Take 1 tablet (40 mg total) by mouth daily. (Patient not taking: Reported on 05/03/2022) 30 tablet 0   topiramate (TOPAMAX) 25 MG tablet Take  1 tablet  2 x /evening  at [employer] & Bed[employer]  for Dieting & Weight Loss                                /                                     TAKE                               BY                                MOUTH (Patient not taking: Reported on 08/03/2022) 180 tablet 3   No current facility-administered medications on file prior to visit.    Allergies: Allergies  Allergen Reactions   Augmentin [Amoxicillin-Pot Clavulanate] Nausea And Vomiting   Codeine Nausea And Vomiting   Zoloft [Sertraline Hcl]     Pt does not recall reaction    Lipitor [Atorvastatin] Other (See Comments)    Leg pain    Current Problems (verified) has Hyperlipidemia, mixed; Vitamin D deficiency; COPD (chronic obstructive pulmonary disease) (HCC); Medication management; CKD (chronic kidney disease) stage 2, GFR 60-89 ml/min; DDD, lumbar; Chronic pain syndrome; Essential hypertension; Depression, major, recurrent, in partial remission (HCC); OSA and COPD overlap syndrome (HCC); Aortic atherosclerosis (HCC) by Chest CT scan - 04/02/2021; Abnormal glucose; Former smoker (30 pack/year, quit 2019); Obesity (BMI  30.0-34.9); History of colon polyps; Osteoporosis; Coronary atherosclerosis of native coronary artery; and Elevated LFTs on their problem list.  Screening Tests Immunization History  Administered Date(s) Administered   PFIZER(Purple Top)SARS-COV-2 Vaccination 07/02/2019, 07/24/2019   Td 01/19/2004, 06/05/2015   Health Maintenance  Topic Date Due   Zoster Vaccines- Shingrix (1 of 2) Never done   COVID-19 Vaccine (3 - Pfizer risk series) 08/21/2019   Pneumonia Vaccine 36+ Years old (1 of 1 - PCV) Never done   Lung Cancer Screening  04/09/2023   Medicare Annual Wellness (AWV)  05/03/2023   MAMMOGRAM  04/08/2024   DTaP/Tdap/Td (3 - Tdap) 06/04/2025   Colonoscopy  01/06/2026   DEXA SCAN  Completed   Hepatitis C Screening  Completed   HPV VACCINES  Aged Out   INFLUENZA VACCINE  Discontinued    Patient Care Team: Lucky Cowboy, MD as PCP - General (Internal Medicine) Jens Som Madolyn Frieze, MD as PCP - Cardiology (Cardiology) Sharrell Ku, MD as Consulting Physician (Gastroenterology) Javier Glazier as Referring Physician (Physician Assistant) Mammography, [facility] (Diagnostic Radiology)  Surgical: She  has a past surgical history that includes Cataract extraction (Bilateral); Spine surgery; Ankle surgery (Right); Tonsillectomy and adenoidectomy; Back surgery; and Breast excisional biopsy (Right). Family Her family history includes COPD in her mother; Cancer in her mother; Coronary artery disease in her paternal uncle; Heart disease in her father; Hypertension in her mother;  Stroke in her mother. Social history  She reports that she quit smoking about 5 years ago. Her smoking use included cigarettes. She has a 30.00 pack-year smoking history. She has never used smokeless tobacco. She reports that she does not drink alcohol and does not use drugs.   Objective:   Today's Vitals   08/03/22 1157  BP: 128/80  Pulse: 60  Temp: 97.6 F (36.4 C)  SpO2: 98%  Weight: 150 lb (68  kg)  Height: 5' (1.524 m)   Body mass index is 29.29 kg/m.  General appearance: alert, no distress, WD/WN, female HEENT: normocephalic, sclerae anicteric, TMs pearly, nares patent, no discharge or erythema, pharynx normal Oral cavity: MMM, no lesions Neck: supple, no lymphadenopathy, no thyromegaly, no masses Heart: RRR, normal S1, S2, no murmurs Lungs: CTA bilaterally, no wheezes, rhonchi, or rales Abdomen: +bs, soft, non tender, non distended, no masses, no hepatomegaly, no splenomegaly Musculoskeletal: nontender, no swelling, does have obvious atrophy to calf.  Tenderness to bilateral lumbar, no CVA tenderness noted.  Extremities: no edema, no cyanosis, no clubbing, right leg with some atrophy and decrease muscle compared to left.  Pulses: 2+ symmetric, upper extremities, RLE thready pulses and cold lower leg, does blanch/mildly slow cap refill. No wounds. Neurological: alert, oriented x 3, CN2-12 intact, strength normal upper extremities Weakness with ankle extension R.  Psychiatric: normal affect, behavior normal, pleasant   Medicare Attestation I have personally reviewed: The patient's medical and social history Their use of alcohol, tobacco or illicit drugs Their current medications and supplements The patient's functional ability including ADLs,fall risks, home safety risks, cognitive, and hearing and visual impairment Diet and physical activities Evidence for depression or mood disorders  The patient's weight, height, BMI, and visual acuity have been recorded in the chart.  I have made referrals, counseling, and provided education to the patient based on review of the above and I have provided the patient with a written personalized care plan for preventive services.     Adela Glimpse, NP   08/03/2022

## 2022-08-03 NOTE — Patient Instructions (Signed)

## 2022-08-04 LAB — CBC WITH DIFFERENTIAL/PLATELET
Absolute Monocytes: 779 cells/uL (ref 200–950)
Basophils Relative: 0.5 %
Eosinophils Absolute: 320 cells/uL (ref 15–500)
Eosinophils Relative: 3.9 %
HCT: 42.1 % (ref 35.0–45.0)
Hemoglobin: 13.8 g/dL (ref 11.7–15.5)
Lymphs Abs: 1632 cells/uL (ref 850–3900)
MCH: 30.8 pg (ref 27.0–33.0)
MCHC: 32.8 g/dL (ref 32.0–36.0)
MCV: 94 fL (ref 80.0–100.0)
Monocytes Relative: 9.5 %
Neutrophils Relative %: 66.2 %
Platelets: 254 10*3/uL (ref 140–400)
RBC: 4.48 10*6/uL (ref 3.80–5.10)
RDW: 11.6 % (ref 11.0–15.0)
Total Lymphocyte: 19.9 %

## 2022-08-04 LAB — COMPLETE METABOLIC PANEL WITH GFR
AG Ratio: 1.9 (calc) (ref 1.0–2.5)
ALT: 49 U/L — ABNORMAL HIGH (ref 6–29)
AST: 56 U/L — ABNORMAL HIGH (ref 10–35)
Albumin: 4.3 g/dL (ref 3.6–5.1)
Alkaline phosphatase (APISO): 73 U/L (ref 37–153)
BUN: 17 mg/dL (ref 7–25)
CO2: 30 mmol/L (ref 20–32)
Calcium: 10.2 mg/dL (ref 8.6–10.4)
Chloride: 106 mmol/L (ref 98–110)
Creat: 0.78 mg/dL (ref 0.50–1.05)
Globulin: 2.3 g/dL (calc) (ref 1.9–3.7)
Glucose, Bld: 99 mg/dL (ref 65–99)
Potassium: 5.3 mmol/L (ref 3.5–5.3)
Sodium: 141 mmol/L (ref 135–146)
Total Bilirubin: 0.5 mg/dL (ref 0.2–1.2)
Total Protein: 6.6 g/dL (ref 6.1–8.1)
eGFR: 83 mL/min/{1.73_m2} (ref >=60)

## 2022-08-04 LAB — LIPID PANEL
Cholesterol: 118 mg/dL (ref <200)
HDL: 64 mg/dL (ref >=50)
LDL Cholesterol (Calc): 40 mg/dL (calc)
Non-HDL Cholesterol (Calc): 54 mg/dL (calc) (ref <130)
Total CHOL/HDL Ratio: 1.8 (calc) (ref <5.0)
Triglycerides: 61 mg/dL (ref <150)

## 2022-08-04 LAB — HEMOGLOBIN A1C
Hgb A1c MFr Bld: 6.1 % of total Hgb — ABNORMAL HIGH (ref <5.7)
Mean Plasma Glucose: 128 mg/dL
eAG (mmol/L): 7.1 mmol/L

## 2022-08-10 ENCOUNTER — Other Ambulatory Visit: Payer: Self-pay

## 2022-08-10 DIAGNOSIS — F3341 Major depressive disorder, recurrent, in partial remission: Secondary | ICD-10-CM

## 2022-08-10 MED ORDER — DULOXETINE HCL 60 MG PO CPEP
ORAL_CAPSULE | ORAL | 3 refills | Status: AC
Start: 2022-08-10 — End: ?

## 2022-08-11 ENCOUNTER — Ambulatory Visit (HOSPITAL_COMMUNITY)
Admission: RE | Admit: 2022-08-11 | Discharge: 2022-08-11 | Disposition: A | Discharge status: Home / Self Care | Payer: Medicare Other | Source: Ambulatory Visit | Attending: Internal Medicine | Admitting: Internal Medicine

## 2022-08-11 DIAGNOSIS — R1313 Dysphagia, pharyngeal phase: Secondary | ICD-10-CM | POA: Insufficient documentation

## 2022-08-11 DIAGNOSIS — R1312 Dysphagia, oropharyngeal phase: Secondary | ICD-10-CM

## 2022-08-11 DIAGNOSIS — R638 Other symptoms and signs concerning food and fluid intake: Secondary | ICD-10-CM | POA: Diagnosis not present

## 2022-08-11 DIAGNOSIS — K449 Diaphragmatic hernia without obstruction or gangrene: Secondary | ICD-10-CM | POA: Diagnosis not present

## 2022-08-11 DIAGNOSIS — R1310 Dysphagia, unspecified: Secondary | ICD-10-CM | POA: Diagnosis present

## 2022-08-11 DIAGNOSIS — R09A2 Foreign body sensation, throat: Secondary | ICD-10-CM

## 2022-08-11 DIAGNOSIS — J449 Chronic obstructive pulmonary disease, unspecified: Secondary | ICD-10-CM | POA: Insufficient documentation

## 2022-08-11 NOTE — Progress Notes (Signed)
Modified Barium Swallow Study  Patient Details  Name: Amber Hickman MRN: [id number] Date of Birth: Nov 30, 1955  Today's Date: 08/11/2022  Modified Barium Swallow completed.  Full report located under Chart Review in the Imaging Section.  History of Present Illness 67 yr old seen for outpatient MBS with complaints of odonphagia, pharyngeal/esophageal globus sensation as well as feeling food "goes the wrong way." She stated this began when she tried to take 4 large pills at one time. PMH: GERD (not symptomatic lately and no meds), hiatal hernia, COPD, pneumonia (not recently). Possible TIA.   Clinical Impression Pt demonstrated min-mild pharyngeal dysphagia with decreased laryngeal elevation and closure leading to laryngeal penetration to vocal cords with thin without awareness and above vocal cords with nectar. A breath hold prior to swallow was ineffective but chin tuck with thin decreased penetration with all episodes except one time flash. When cued to cough she was able to mobilize pentrates either out of vestibule or to upper part of epiglottis. There was mild vallecular and pyriform sinus residue that she sensed most of the time and performed subsequent swallows to clear. Esophageal sweep was unremarkable. Despite no upper dentition pt able to [facility] and clear oral cavity without difficulty. Recommend pt continue regular texture, thin liquids using chin tuck posture, pills whole in puree and throat clear/cough during meals. Factors that may increase risk of adverse event in presence of aspiration Rubye Oaks & Clearance Coots 2021):    Swallow Evaluation Recommendations Recommendations: PO diet PO Diet Recommendation: Regular;Thin liquids (Level 0) Liquid Administration via: Cup Medication Administration: Whole meds with puree Supervision: Patient able to self-feed Swallowing strategies  : Clear throat intermittently;Hard cough after swallowing;Chin tuck (chin tuck with thin) Postural  changes: Position pt fully upright for meals      Rosangela Fehrenbach, Breck Coons 08/11/2022,1:39 PM

## 2022-08-15 NOTE — Progress Notes (Signed)
Future Appointments  Date Time Provider Department  08/16/2022  4:00 PM Lucky Cowboy, MD [facility]  11/03/2022 11:00 AM Lucky Cowboy, MD [facility]  05/05/2023 11:00 AM Adela Glimpse, NP [facility]    History of Present Illness:       This very nice 67 y.o. single WF with  HTN, HLD, Prediabetes,  hx/o  COPD & OSA (off CPAP) and Vitamin D Deficiency presents  with concerns of several scaly inflamed superficial circumscribed areas of her R temple, R cheek & L arm.     Current Outpatient Medications on File Prior to Visit  Medication Sig   baclofen (LIORESAL) 20 MG tablet Take 20 mg by mouth 3 (three) times daily as needed for muscle spasms.   cholecalciferol 5000 units TABS Take 2 tablets daily   DULoxetine (CYMBALTA) 60 MG capsule Take 1 capsule daily for chronic pain   ezetimibe (ZETIA) 10 MG tablet Take 1 tablet (10 mg total) by mouth daily.   fluticasone-salmeterol (WIXELA INHUB) 500-50 MCG/ACT AEPB Use   1 Inhalation   2 x / day (every 12 hours)  for Asthma   ketotifen (ZADITOR) 0.025 % ophthalmic solution Place 1 drop into both eyes 3 (three) times daily as needed (allergies).   olmesartan (BENICAR) 20 MG tablet TAKE 1 TABLET BY MOUTH DAILY   oxyCODONE (ROXICODONE) 15 MG immediate release tablet Take 15 mg by mouth every 6 (six) hours as needed for pain.    rosuvastatin (CRESTOR) 40 MG tablet Take 1 tab daily for cholesterol.     Allergies  Allergen Reactions   Augmentin [Amoxicillin-Pot Clavulanate] Nausea And Vomiting   Codeine Nausea And Vomiting   Zoloft [Sertraline Hcl]     Pt does not recall reaction    Lipitor [Atorvastatin] Other (See Comments)    Leg pain     Problem list She has Hyperlipidemia, mixed; Vitamin D deficiency; COPD (chronic obstructive pulmonary disease) (HCC); Medication management; CKD (chronic kidney disease) stage 2, GFR 60-89 ml/min; DDD, lumbar; Chronic pain syndrome; Essential hypertension; Depression, major, recurrent, in partial  remission (HCC); OSA and COPD overlap syndrome (HCC); Aortic atherosclerosis (HCC) by Chest CT scan - 04/02/2021; Abnormal glucose; Former smoker (30 pack/year, quit 2019); Obesity (BMI 30.0-34.9); History of colon polyps; Osteoporosis; Coronary atherosclerosis of native coronary artery; and Elevated LFTs on their problem list.   Observations/Objective:  BP 126/78   Pulse 72   Temp 98 F (36.7 C)   Resp 17   Ht 5' (1.524 m)   Wt 149 lb 12.8 oz (67.9 kg)   SpO2 97%   BMI 29.26 kg/m   Skin focused exams finds 3 areas of concern each measuring approx 5 mm x 5 mm that are circumscribed, erythematous & scaly to ? ulcerated  vs excoriated .  Procedure - Cryosurgery  (  CPT 17000   &   17003 x 2  )     After informed consent the areas of the Rt temple, Rt lateral cheek & Lt distal ;lateral arm 2" above the elbow were treated with liquid Nitrogen compression swab by triple Freeze / Thaw technique & tolerated the procedure well. Patient was instructed in post op care & expectations .   Assessment and Plan:   1. Neoplasm of uncertain behavior of cheek   2. Neoplasm of uncertain behavior of skin of face   3. Neoplasm of uncertain behavior of skin of upper arm    Follow Up Instructions:        I  discussed the assessment and treatment plan with the patient. The patient was provided an opportunity to ask questions and all were answered. The patient agreed with the plan and demonstrated an understanding of the instructions.       The patient was advised to call back or seek an in-person evaluation if the symptoms worsen or if the condition fails to improve as anticipated.    Marinus Maw, MD

## 2022-08-16 ENCOUNTER — Ambulatory Visit (INDEPENDENT_AMBULATORY_CARE_PROVIDER_SITE_OTHER): Payer: Medicare Other | Admitting: Internal Medicine

## 2022-08-16 ENCOUNTER — Encounter: Payer: Self-pay | Admitting: Internal Medicine

## 2022-08-16 VITALS — BP 126/78 | HR 72 | Temp 98.0°F | Resp 17 | Ht 60.0 in | Wt 149.8 lb

## 2022-08-16 DIAGNOSIS — D485 Neoplasm of uncertain behavior of skin: Secondary | ICD-10-CM | POA: Diagnosis not present

## 2022-08-23 ENCOUNTER — Encounter: Payer: Self-pay | Admitting: Internal Medicine

## 2022-08-23 NOTE — Patient Instructions (Signed)
Cryosurgery for Skin Conditions Cryosurgery is the use of a very cold liquid (liquid nitrogen) to treat skin that is not normal. This treatment is also called cryotherapy. It can also freeze or take away growths on skin, such as: Warts. Skin sores that could become cancer. Some skin cancers. This treatment usually takes a few minutes. It can be done in your doctor's office. Tell a doctor about: Any allergies you have. All medicines you are taking, including vitamins, herbs, eye drops, creams, and over-the-counter medicines. Any problems you or family members have had with medicines that numb certain areas of your body so you will not feel pain (anesthesia). Any bleeding problems you have. Any surgeries you have had. Any medical conditions you have. Whether you are pregnant or may be pregnant. What are the risks? Your doctor will talk with you about risks. These may include: Infection. Bleeding. Scars. Changes in skin color. These may include skin that is lighter or darker than it was before it was treated. Swelling. Losing hair in the treated area. Damage to nearby parts or organs, such as nerve damage and loss of feeling. This is rare. What happens before the procedure? Your doctor will talk with you about: The treatment. The benefits and risks. What happens during the procedure?  Your treatment will be done in one of these two ways: Your doctor may use a tool (probe) on your skin. The tool has very cold liquid in it to cool it down. The tool will be used until the growth is frozen and destroyed. Your doctor may use a swab or spray to get the very cold liquid onto your skin. Your doctor will keep using the very cold liquid until the growth is frozen and destroyed. The treated area may be covered with a bandage (dressing). The procedure may vary among doctors and hospitals. What happens after the procedure? You may be monitored until you leave the hospital or clinic. This includes  checking your blood pressure, heart rate, breathing rate, and blood oxygen level. You may have mild stinging or burning in the area that was treated. This information is not intended to replace advice given to you by your health care provider. Make sure you discuss any questions you have with your health care provider. Document Revised: 10/01/2021 Document Reviewed: 10/01/2021 Elsevier Patient Education  2024 [employer].

## 2022-08-24 NOTE — Progress Notes (Unsigned)
Future Appointments  Date Time Provider Department  08/16/2022  4:00 PM Lucky Cowboy, MD [facility]  11/03/2022 11:00 AM Lucky Cowboy, MD [facility]  05/05/2023 11:00 AM Adela Glimpse, NP [facility]    History of Present Illness:       This very nice 67 y.o. single WF with  HTN, HLD, Prediabetes,  hx/o  COPD & OSA (off CPAP) and Vitamin D Deficiency presents  with concerns of several scaly inflamed superficial circumscribed areas of her R temple, R cheek & L arm.     Current Outpatient Medications on File Prior to Visit  Medication Sig   baclofen (LIORESAL) 20 MG tablet Take 20 mg by mouth 3 (three) times daily as needed for muscle spasms.   cholecalciferol 5000 units TABS Take 2 tablets daily   DULoxetine (CYMBALTA) 60 MG capsule Take 1 capsule daily for chronic pain   ezetimibe (ZETIA) 10 MG tablet Take 1 tablet (10 mg total) by mouth daily.   fluticasone-salmeterol (WIXELA INHUB) 500-50 MCG/ACT AEPB Use   1 Inhalation   2 x / day (every 12 hours)  for Asthma   ketotifen (ZADITOR) 0.025 % ophthalmic solution Place 1 drop into both eyes 3 (three) times daily as needed (allergies).   olmesartan (BENICAR) 20 MG tablet TAKE 1 TABLET BY MOUTH DAILY   oxyCODONE (ROXICODONE) 15 MG immediate release tablet Take 15 mg by mouth every 6 (six) hours as needed for pain.    rosuvastatin (CRESTOR) 40 MG tablet Take 1 tab daily for cholesterol.     Allergies  Allergen Reactions   Augmentin [Amoxicillin-Pot Clavulanate] Nausea And Vomiting   Codeine Nausea And Vomiting   Zoloft [Sertraline Hcl]     Pt does not recall reaction    Lipitor [Atorvastatin] Other (See Comments)    Leg pain     Problem list She has Hyperlipidemia, mixed; Vitamin D deficiency; COPD (chronic obstructive pulmonary disease) (HCC); Medication management; CKD (chronic kidney disease) stage 2, GFR 60-89 ml/min; DDD, lumbar; Chronic pain syndrome; Essential hypertension; Depression, major, recurrent, in partial  remission (HCC); OSA and COPD overlap syndrome (HCC); Aortic atherosclerosis (HCC) by Chest CT scan - 04/02/2021; Abnormal glucose; Former smoker (30 pack/year, quit 2019); Obesity (BMI 30.0-34.9); History of colon polyps; Osteoporosis; Coronary atherosclerosis of native coronary artery; and Elevated LFTs on their problem list.   Observations/Objective:  BP 126/78   Pulse 72   Temp 98 F (36.7 C)   Resp 17   Ht 5' (1.524 m)   Wt 149 lb 12.8 oz (67.9 kg)   SpO2 97%   BMI 29.26 kg/m   Skin focused exams finds 3 areas of concern each measuring approx 5 mm x 5 mm that are circumscribed, erythematous & scaly to ? ulcerated  vs excoriated .  Procedure - Cryosurgery  (  CPT 17000   &   17003 x 2  )     After informed consent the areas of the Rt temple, Rt lateral cheek & Lt distal ;lateral arm 2" above the elbow were treated with liquid Nitrogen compression swab by triple Freeze / Thaw technique & tolerated the procedure well. Patient was instructed in post op care & expectations .   Assessment and Plan:   1. Neoplasm of uncertain behavior of cheek   2. Neoplasm of uncertain behavior of skin of face   3. Neoplasm of uncertain behavior of skin of upper arm    Follow Up Instructions:        I  discussed the assessment and treatment plan with the patient. The patient was provided an opportunity to ask questions and all were answered. The patient agreed with the plan and demonstrated an understanding of the instructions.       The patient was advised to call back or seek an in-person evaluation if the symptoms worsen or if the condition fails to improve as anticipated.    Marinus Maw, MD

## 2022-08-25 ENCOUNTER — Ambulatory Visit (INDEPENDENT_AMBULATORY_CARE_PROVIDER_SITE_OTHER): Payer: Medicare Other | Admitting: Nurse Practitioner

## 2022-08-25 ENCOUNTER — Encounter: Payer: Self-pay | Admitting: Nurse Practitioner

## 2022-08-25 VITALS — BP 114/76 | HR 73 | Temp 97.2°F | Ht 60.0 in | Wt 153.0 lb

## 2022-08-25 DIAGNOSIS — R7989 Other specified abnormal findings of blood chemistry: Secondary | ICD-10-CM

## 2022-08-26 LAB — HEPATITIS B SURFACE ANTIBODY,QUALITATIVE: Hep B S Ab: NONREACTIVE

## 2022-08-26 LAB — HEPATITIS B CORE ANTIBODY, TOTAL: Hep B Core Total Ab: REACTIVE — AB

## 2022-08-26 LAB — HEPATITIS B E ANTIBODY: Hep B E Ab: NONREACTIVE

## 2022-08-26 LAB — HEPATITIS C ANTIBODY: Hepatitis C Ab: NONREACTIVE

## 2022-08-26 LAB — HEPATITIS A ANTIBODY, TOTAL: Hepatitis A AB,Total: REACTIVE — AB

## 2022-08-31 ENCOUNTER — Other Ambulatory Visit: Payer: Self-pay | Admitting: Nurse Practitioner

## 2022-09-02 DIAGNOSIS — M545 Low back pain, unspecified: Secondary | ICD-10-CM | POA: Diagnosis not present

## 2022-09-02 DIAGNOSIS — M47896 Other spondylosis, lumbar region: Secondary | ICD-10-CM | POA: Diagnosis not present

## 2022-09-02 DIAGNOSIS — G894 Chronic pain syndrome: Secondary | ICD-10-CM | POA: Diagnosis not present

## 2022-09-08 ENCOUNTER — Other Ambulatory Visit: Payer: Self-pay | Admitting: Nurse Practitioner

## 2022-09-08 DIAGNOSIS — R7989 Other specified abnormal findings of blood chemistry: Secondary | ICD-10-CM

## 2022-09-13 ENCOUNTER — Ambulatory Visit (INDEPENDENT_AMBULATORY_CARE_PROVIDER_SITE_OTHER): Payer: Medicare Other | Admitting: Nurse Practitioner

## 2022-09-13 ENCOUNTER — Encounter: Payer: Self-pay | Admitting: Nurse Practitioner

## 2022-09-13 VITALS — BP 118/80 | HR 82 | Temp 97.3°F | Ht 60.0 in | Wt 150.0 lb

## 2022-09-13 DIAGNOSIS — J449 Chronic obstructive pulmonary disease, unspecified: Secondary | ICD-10-CM

## 2022-09-13 DIAGNOSIS — R0609 Other forms of dyspnea: Secondary | ICD-10-CM | POA: Diagnosis not present

## 2022-09-13 NOTE — Progress Notes (Signed)
Assessment and Plan:  Lean Loosli Supinski was seen today for an episodic visit.  Diagnoses and all order for this visit:  1. Chronic obstructive pulmonary disease, unspecified COPD type (HCC) Continue daily Advair Avoid heat Stay well hydrated  2. DOE (dyspnea on exertion) Continue to monitor for worsening symptoms. Continue inhalers Avoid triggers Continue to monitor  Notify office for further evaluation and treatment, questions or concerns if s/s fail to improve. The risks and benefits of my recommendations, as well as other treatment options were discussed with the patient today. Questions were answered.  Further disposition pending results of labs. Discussed med's effects and SE's.    Over 15 minutes of exam, counseling, chart review, and critical decision making was performed.   Future Appointments  Date Time Provider Department Center  11/03/2022 11:00 AM Lucky Cowboy, MD [facility] None  11/05/2022 11:00 AM [facility] DX DEXA 1 [facility]  05/05/2023 11:00 AM Sadaf Przybysz, NP [facility] None    ------------------------------------------------------------------------------------------------------------------   HPI BP 118/80   Pulse 82   Temp (!) 97.3 F (36.3 C)   Ht 5' (1.524 m)   Wt 150 lb (68 kg)   SpO2 94%   BMI 29.29 kg/m   67 y.o.female presents for evaluation of SOB accompanied by DOE specifically when working int he heat last week.  States she felt SOB and had to sit down.  When she went inside the house she felt nauseas.  She does report sweating and not staying well hydrated.  Felts she may have been affected by the heat working outdoors.  She reports drinking a Pedialyte which made her feel better.  She does not use daily inhaler Advair.  Denies any recent URI, wheezing, fever, chills.    Past Medical History:  Diagnosis Date   Allergy    Arthritis    AVN (avascular necrosis of bone) (HCC)    Left shoulder   COPD (chronic obstructive  pulmonary disease) (HCC)    Depression    GERD (gastroesophageal reflux disease)    Hyperlipidemia    Hypertension    Memory disorder 12/23/2016   Osteoporosis    TIA (transient ischemic attack) 09/30/2017   Vitamin D deficiency      Allergies  Allergen Reactions   Augmentin [Amoxicillin-Pot Clavulanate] Nausea And Vomiting   Codeine Nausea And Vomiting   Zoloft [Sertraline Hcl]     Pt does not recall reaction    Lipitor [Atorvastatin] Other (See Comments)    Leg pain    Current Outpatient Medications on File Prior to Visit  Medication Sig   aspirin EC 81 MG tablet Take 81 mg by mouth daily. Swallow whole.   baclofen (LIORESAL) 20 MG tablet Take 20 mg by mouth 3 (three) times daily as needed for muscle spasms.   cholecalciferol 5000 units TABS Take 2 tablets daily   DULoxetine (CYMBALTA) 60 MG capsule Take 1 capsule daily for chronic pain   ezetimibe (ZETIA) 10 MG tablet Take 1 tablet (10 mg total) by mouth daily.   fluticasone-salmeterol (WIXELA INHUB) 500-50 MCG/ACT AEPB Use   1 Inhalation   2 x / day (every 12 hours)  for Asthma   ketotifen (ZADITOR) 0.025 % ophthalmic solution Place 1 drop into both eyes 3 (three) times daily as needed (allergies).   olmesartan (BENICAR) 20 MG tablet TAKE 1 TABLET BY MOUTH DAILY   oxyCODONE (ROXICODONE) 15 MG immediate release tablet Take 15 mg by mouth every 6 (six) hours as needed for pain.  rosuvastatin (CRESTOR) 40 MG tablet Take 1 tab daily for cholesterol.   Zinc 50 MG TABS Take by mouth daily.   No current facility-administered medications on file prior to visit.    ROS: all negative except what is noted in the HPI.   Physical Exam:  BP 118/80   Pulse 82   Temp (!) 97.3 F (36.3 C)   Ht 5' (1.524 m)   Wt 150 lb (68 kg)   SpO2 94%   BMI 29.29 kg/m   General Appearance: NAD.  Awake, conversant and cooperative. Eyes: PERRLA, EOMs intact.  Sclera white.  Conjunctiva without erythema. Sinuses: No frontal/maxillary  tenderness.  No nasal discharge. Nares patent.  ENT/Mouth: Ext aud canals clear.  Bilateral TMs w/DOL and without erythema or bulging. Hearing intact.  Posterior pharynx without swelling or exudate.  Tonsils without swelling or erythema.  Neck: Supple.  No masses, nodules or thyromegaly. Respiratory: Effort is regular with non-labored breathing. Breath sounds are equal bilaterally without rales, rhonchi, wheezing or stridor.  Cardio: RRR with no MRGs. Brisk peripheral pulses without edema.  Abdomen: Active BS in all four quadrants.  Soft and non-tender without guarding, rebound tenderness, hernias or masses. Lymphatics: Non tender without lymphadenopathy.  Musculoskeletal: Full ROM, 5/5 strength, normal ambulation.  No clubbing or cyanosis. Skin: Appropriate color for ethnicity. Warm without rashes, lesions, ecchymosis, ulcers.  Neuro: CN II-XII grossly normal. Normal muscle tone without cerebellar symptoms and intact sensation.   Psych: AO X 3,  appropriate mood and affect, insight and judgment.     Adela Glimpse, NP 4:36 PM [facility]

## 2022-09-13 NOTE — Patient Instructions (Signed)
Heat Exhaustion Heat exhaustion happens when the body gets overheated from hot weather, exercise, strenuous physical activity, dehydration, or a combination of these. It is important to treat heat exhaustion as soon as possible. If untreated, heat exhaustion can lead to heat stroke, which is a medical emergency and can be life-threatening. What are the causes? Common causes of heat exhaustion include: Exercising or doing strenuous labor or activity in hot or humid weather. Being exposed to very warm and poorly ventilated environments, such as living in a home without air conditioning or being in a car without good ventilation. Not drinking enough water, especially in hot or humid weather. Not having enough salts and minerals in the blood (electrolytes). What increases the risk? The following factors may make you more likely to develop this condition: Exercising beyond your fitness level in hot conditions. Exercising or working in hot weather when your body is not used to the heat. Wearing clothing that does not allow your sweat to evaporate. Drinking a lot of alcoholic beverages or beverages that have caffeine. This can lead to dehydration. Being age 64 or older. Being a child. Having certain chronic medical conditions, such as heart disease, poor circulation or other vascular diseases, sickle cell disease, high blood pressure, diabetes, lung disease, or cystic fibrosis. Being very overweight (obese). Taking certain medicines, such as those for high blood pressure, antihistamines, or tranquilizers. Using drugs such as cocaine, heroin, or amphetamines. What are the signs or symptoms? Symptoms of this condition include: Heavy sweating along with feeling weak, dizzy, light-headed, and nauseous. Vomiting. Red, blotchy rash over the body. This is also called prickly heat. Rapid heartbeat. Headache. Body temperature over 102.62F (39C). Urine that is darker than normal. Muscle cramps, such as  in the leg or side (flank). Moist, cool, and clammy skin. Fatigue. Thirst. Difficulty focusing or concentrating. Passing out. Signs and symptoms may develop suddenly or over time. How is this diagnosed? This condition may be diagnosed based on your symptoms, a physical exam, and history of heat exposure. How is this treated? This condition may be treated by: Immediately stopping physical activity. Moving to a cooler, shaded area with good ventilation and airflow, or to an air-conditioned environment. Removing all unnecessary clothing to expose as much skin to the air as possible. Using cooling fans and water-misting sprays to cool the body down. Drinking plenty of fluids, including sports drinks with electrolytes. Having cooling blankets placed on you to lower your body temperature. Giving you fluids through an IV in a hospital. Follow these instructions at home: If you think that you have heat exhaustion: Ask a friend or a family member to stay with you. Take a cool bath or shower. Drink enough fluid to keep your urine pale yellow. Lie down and rest. Return to your normal activities as told by your health care provider. Ask your health care provider what activities are safe for you. Contact a health care provider if: Symptoms last for more than 30 minutes. You feel your symptoms are not improving after taking steps to cool down. Get help right away if: You have any symptoms of heat stroke. These include: Temperature of 104F (40C) or higher. Diffusely red skin. Inability to sweat, resulting in hot, dry skin. Excessive thirst. Nausea and vomiting. Rapid breathing. Chest pain. Headache. Confusion or disorientation. Fainting. Seizure. These symptoms may represent a serious problem that is an emergency. Do not wait to see if the symptoms will go away. Get medical help right away. Call your local emergency  services (911 in the [place] Do not drive yourself to the  hospital. Summary Heat exhaustion happens when your body gets overheated from hot weather, strenuous activity, and dehydration. Symptoms include sweating, fatigue, muscle cramps, and headache. Treat heat exhaustion immediately by moving to a ventilated and cool environment, having cool water sprayed on as much of the skin as possible, removing all unnecessary clothing, and drinking fluids with electrolytes. If your symptoms last for more than 30 minutes, contact a health care provider. If untreated, heat exhaustion can lead to heat stroke, which is a medical emergency and can be life-threatening. This information is not intended to replace advice given to you by your health care provider. Make sure you discuss any questions you have with your health care provider. Document Revised: 07/30/2019 Document Reviewed: 07/30/2019 Elsevier Patient Education  2024 [employer].

## 2022-09-29 ENCOUNTER — Telehealth: Payer: Self-pay | Admitting: Nurse Practitioner

## 2022-09-29 ENCOUNTER — Other Ambulatory Visit: Payer: Self-pay | Admitting: Nurse Practitioner

## 2022-09-29 DIAGNOSIS — M810 Age-related osteoporosis without current pathological fracture: Secondary | ICD-10-CM

## 2022-09-29 DIAGNOSIS — I10 Essential (primary) hypertension: Secondary | ICD-10-CM

## 2022-09-29 MED ORDER — OLMESARTAN MEDOXOMIL 20 MG PO TABS
ORAL_TABLET | ORAL | 11 refills | Status: AC
Start: 2022-09-29 — End: ?

## 2022-09-29 MED ORDER — ALENDRONATE SODIUM 70 MG PO TABS
70.0000 mg | ORAL_TABLET | ORAL | 3 refills | Status: AC
Start: 2022-09-29 — End: 2023-09-29

## 2022-09-29 NOTE — Telephone Encounter (Signed)
Requesting refill on Alendronate and Olmesartan. Pls send to [facility] on file.

## 2022-09-29 NOTE — Addendum Note (Signed)
Addended by: Dionicio Stall on: 09/29/2022 11:05 AM   Modules accepted: Orders

## 2022-09-30 DIAGNOSIS — G894 Chronic pain syndrome: Secondary | ICD-10-CM | POA: Diagnosis not present

## 2022-09-30 DIAGNOSIS — M47896 Other spondylosis, lumbar region: Secondary | ICD-10-CM | POA: Diagnosis not present

## 2022-11-01 DIAGNOSIS — Z79891 Long term (current) use of opiate analgesic: Secondary | ICD-10-CM | POA: Diagnosis not present

## 2022-11-01 DIAGNOSIS — M5106 Intervertebral disc disorders with myelopathy, lumbar region: Secondary | ICD-10-CM | POA: Diagnosis not present

## 2022-11-01 DIAGNOSIS — M47896 Other spondylosis, lumbar region: Secondary | ICD-10-CM | POA: Diagnosis not present

## 2022-11-01 DIAGNOSIS — M546 Pain in thoracic spine: Secondary | ICD-10-CM | POA: Diagnosis not present

## 2022-11-01 DIAGNOSIS — G894 Chronic pain syndrome: Secondary | ICD-10-CM | POA: Diagnosis not present

## 2022-11-01 DIAGNOSIS — Z79899 Other long term (current) drug therapy: Secondary | ICD-10-CM | POA: Diagnosis not present

## 2022-11-03 ENCOUNTER — Ambulatory Visit (INDEPENDENT_AMBULATORY_CARE_PROVIDER_SITE_OTHER): Payer: Medicare Other | Admitting: Internal Medicine

## 2022-11-03 ENCOUNTER — Encounter: Payer: Self-pay | Admitting: Internal Medicine

## 2022-11-03 VITALS — BP 110/68 | HR 98 | Temp 97.9°F | Resp 16 | Ht 60.0 in | Wt 156.4 lb

## 2022-11-03 DIAGNOSIS — E559 Vitamin D deficiency, unspecified: Secondary | ICD-10-CM

## 2022-11-03 DIAGNOSIS — E782 Mixed hyperlipidemia: Secondary | ICD-10-CM

## 2022-11-03 DIAGNOSIS — I10 Essential (primary) hypertension: Secondary | ICD-10-CM | POA: Diagnosis not present

## 2022-11-03 DIAGNOSIS — Z122 Encounter for screening for malignant neoplasm of respiratory organs: Secondary | ICD-10-CM

## 2022-11-03 DIAGNOSIS — R7309 Other abnormal glucose: Secondary | ICD-10-CM | POA: Diagnosis not present

## 2022-11-03 DIAGNOSIS — F3341 Major depressive disorder, recurrent, in partial remission: Secondary | ICD-10-CM

## 2022-11-03 DIAGNOSIS — M25552 Pain in left hip: Secondary | ICD-10-CM

## 2022-11-03 DIAGNOSIS — Z1211 Encounter for screening for malignant neoplasm of colon: Secondary | ICD-10-CM

## 2022-11-03 DIAGNOSIS — G894 Chronic pain syndrome: Secondary | ICD-10-CM

## 2022-11-03 DIAGNOSIS — I700 Atherosclerosis of aorta: Secondary | ICD-10-CM

## 2022-11-03 DIAGNOSIS — Z0000 Encounter for general adult medical examination without abnormal findings: Secondary | ICD-10-CM | POA: Diagnosis not present

## 2022-11-03 DIAGNOSIS — M5136 Other intervertebral disc degeneration, lumbar region: Secondary | ICD-10-CM

## 2022-11-03 DIAGNOSIS — Z8249 Family history of ischemic heart disease and other diseases of the circulatory system: Secondary | ICD-10-CM

## 2022-11-03 DIAGNOSIS — Z87891 Personal history of nicotine dependence: Secondary | ICD-10-CM

## 2022-11-03 DIAGNOSIS — Z136 Encounter for screening for cardiovascular disorders: Secondary | ICD-10-CM

## 2022-11-03 DIAGNOSIS — Z79899 Other long term (current) drug therapy: Secondary | ICD-10-CM

## 2022-11-03 DIAGNOSIS — G4733 Obstructive sleep apnea (adult) (pediatric): Secondary | ICD-10-CM

## 2022-11-03 DIAGNOSIS — Z0001 Encounter for general adult medical examination with abnormal findings: Secondary | ICD-10-CM

## 2022-11-03 NOTE — Progress Notes (Signed)
Annual Screening/Preventative Visit & Comprehensive Evaluation &  Examination  Future Appointments  Date Time Provider Department  10/30/2021 11:00 AM Lucky Cowboy, MD [facility]  07/19/2022 10:00 AM Adela Glimpse, NP [facility]  11/03/2022 11:00 AM Lucky Cowboy, MD [facility]          This very nice 67 y.o. single WF with   HTN, HLD, Prediabetes  and Vitamin D Deficiency, COPD who   presents for a Screening /Preventative Visit & comprehensive evaluation and management of multiple medical co-morbidities. Patient has hx/o  COPD & OSA (off CPAP). Hx/o Major Depression in remission on Prozac. CT scan in Aug 2021 showed Aortic Atherosclerosis.                                                     The patient is followed in pain management and is on SS Disability  for chronic pain from Lumbar DDD &  surg x 2 in 2004.  Today , she's also c/o bilat hip pains and requests referral .                                                          Patient has COPD from 35+ pk years smoking history. Last year,  she underwent LD Screening  & we discussed lung cancer screening again.  She  is agreeable to undergo another screening low dose CT scan of the chest.  I will refer her for a LDCT lung scan. She alleges stopping smoking in March 2019.         Labile HTN predates since 1998. Patient's BP has been controlled at home and patient denies any cardiac symptoms as chest pain, palpitations, shortness of breath, dizziness or ankle swelling. Today's BP is at goal -  110/68.       Patient's hyperlipidemia is controlled with diet and medications. Patient denies myalgias or other medication SE's. Last lipids were at goal :  Lab Results  Component Value Date   CHOL 118 08/03/2022   HDL 64 08/03/2022   LDLCALC 40 08/03/2022   TRIG 61 08/03/2022   CHOLHDL 1.8 08/03/2022         Patient has hx/o prediabetes predating since      and patient denies reactive hypoglycemic symptoms, visual blurring,  diabetic polys or paresthesias. Last A1c was  not at goal :  Lab Results  Component Value Date   HGBA1C 6.1 (H) 08/03/2022         Finally, patient has history of Vitamin D Deficiency and last Vitamin D was  at goal:  Lab Results  Component Value Date   VD25OH 82 05/03/2022       Current Outpatient Medications  Medication Instructions   alendronate  70 mg  Every 7 days   aspirin EC  81 mg  Daily   baclofen     20 mg, 3 times daily PRN   cholecalciferol 5000 units TABS Take 2 tablets daily   DULoxetine 60 MG capsule Take 1 capsule daily for chronic pain   ezetimibe  10 mg  Daily   fluticasone-salmeterol 500-50 MCG Use   1 Inhalation   2 x /  day (every 12 hours)    ZADITOR 0.025 % ophth  soln 1 drop, Both Eyes, 3 times daily PRN   olmesartan  20 MG tablet TAKE 1 TABLET  DAILY   oxyCODONE  15 mg Every 6 hours PRN   rosuvastatin  40 MG tablet Take 1 tab daily    Zinc 50 MG TABS Oral, Daily     Allergies  Allergen Reactions   Augmentin  Nausea And Vomiting   Codeine Nausea And Vomiting   Zoloft [Sertraline Hcl] Pt does not recall reaction    Lipitor [Atorvastatin] Leg pain     Past Medical History:  Diagnosis Date   Allergy    Arthritis    AVN (avascular necrosis of bone) (HCC)    Left shoulder   COPD (chronic obstructive pulmonary disease) (HCC)    Depression    GERD (gastroesophageal reflux disease)    Hyperlipidemia    Hypertension    Memory disorder 12/23/2016   TIA (transient ischemic attack) 09/30/2017   Vitamin D deficiency      Health Maintenance  Topic Date Due   PAP SMEAR-Modifier  01/18/2013   COVID-19 Vaccine (3 - Pfizer risk series) 08/21/2019   PNA vac Low Risk Adult (1 of 2 - PCV13) Never done   Zoster Vaccines- Shingrix (1 of 2) 10/16/2020    MAMMOGRAM  02/11/2022   COLONOSCOPY (Pts 45-35yrs Insurance coverage will need to be confirmed)  09/23/2022   TETANUS/TDAP  06/04/2025   DEXA SCAN  Completed   Hepatitis C Screening  Completed   HIV  Screening  Completed   HPV VACCINES  Aged Out   INFLUENZA VACCINE  Discontinued    Immunization History  Administered Date(s) Administered   PFIZER SARS-COV-2 Vaccination 07/02/2019, 07/24/2019   Td 01/19/2004, 06/05/2015    Last Colon - 09/22/2017 - Dr Kinnie Scales - Recc 5 yr   f/u Colon in Aug 2024   Last MGM - 02/12/2020    Past Surgical History:  Procedure Laterality Date   ANKLE SURGERY Right    BACK SURGERY     BREAST EXCISIONAL BIOPSY Right    CATARACT EXTRACTION Bilateral    SPINE SURGERY     lumbar   TONSILLECTOMY AND ADENOIDECTOMY       Family History  Problem Relation Age of Onset   Hypertension Mother    COPD Mother    Stroke Mother    Cancer Mother        breast   Heart disease Father        Age 67, MI   Breast cancer Neg Hx      Social History   Tobacco Use   Smoking status: Former    Packs/day: 1.00    Years: 30.00    Pack years: 30.00    Types: Cigarettes    Quit date: 05/10/2017    Years since quitting: 3.3   Smokeless tobacco: Never  Substance Use Topics   Alcohol use: No   Drug use: No      ROS Constitutional: Denies fever, chills, weight loss/gain, headaches, insomnia,  night sweats, and change in appetite. Does c/o fatigue. Eyes: Denies redness, blurred vision, diplopia, discharge, itchy, watery eyes.  ENT: Denies discharge, congestion, post nasal drip, epistaxis, sore throat, earache, hearing loss, dental pain, Tinnitus, Vertigo, Sinus pain, snoring.  Cardio: Denies chest pain, palpitations, irregular heartbeat, syncope, dyspnea, diaphoresis, orthopnea, PND, claudication, edema Respiratory: denies cough, dyspnea, DOE, pleurisy, hoarseness, laryngitis, wheezing.  Gastrointestinal: Denies dysphagia, heartburn, reflux, water brash,  pain, cramps, nausea, vomiting, bloating, diarrhea, constipation, hematemesis, melena, hematochezia, jaundice, hemorrhoids Genitourinary: Denies dysuria, frequency, urgency, nocturia, hesitancy, discharge,  hematuria, flank pain Breast: Breast lumps, nipple discharge, bleeding.  Musculoskeletal: Denies arthralgia, myalgia, stiffness, Jt. Swelling, pain, limp, and strain/sprain. Denies falls. Skin: Denies puritis, rash, hives, warts, acne, eczema, changing in skin lesion Neuro: No weakness, tremor, incoordination, spasms, paresthesia, pain Psychiatric: Denies confusion, memory loss, sensory loss. Denies Depression. Endocrine: Denies change in weight, skin, hair change, nocturia, and paresthesia, diabetic polys, visual blurring, hyper / hypo glycemic episodes.  Heme/Lymph: No excessive bleeding, bruising, enlarged lymph nodes.  Physical Exam  BP 110/68   Pulse 98   Temp 97.9 F (36.6 C)   Resp 16   Ht 5' (1.524 m)   Wt 156 lb 6.4 oz (70.9 kg)   SpO2 97%   BMI 30.54 kg/m   General Appearance: Over nourished and in no apparent distress.  Eyes: PERRLA, EOMs, conjunctiva no swelling or erythema, normal fundi and vessels. Sinuses: No frontal/maxillary tenderness ENT/Mouth: EACs patent / TMs  nl. Nares clear without erythema, swelling, mucoid exudates. Oral hygiene is good. No erythema, swelling, or exudate. Tongue normal, non-obstructing. Tonsils not swollen or erythematous. Hearing normal.  Neck: Supple, thyroid not palpable. No bruits, nodes or JVD. Respiratory: Respiratory effort normal.  BS equal and clear bilateral without rales, rhonci, wheezing or stridor. Cardio: Heart sounds are normal with regular rate and rhythm and no murmurs, rubs or gallops. Peripheral pulses are normal and equal bilaterally without edema. No aortic or femoral bruits. Chest: symmetric with normal excursions and percussion. Breasts: Symmetric, without lumps, nipple discharge, retractions, or fibrocystic changes.  Abdomen: Flat, soft with bowel sounds active. Nontender, no guarding, rebound, hernias, masses, or organomegaly.  Lymphatics: Non tender without lymphadenopathy.  Musculoskeletal: Full ROM all peripheral  extremities, joint stability, 5/5 strength, and normal gait. Skin: Warm and dry without rashes, lesions, cyanosis, clubbing or  ecchymosis.  Neuro: Cranial nerves intact, reflexes equal bilaterally. Normal muscle tone, no cerebellar symptoms. Sensation intact.  Pysch: Alert and oriented X 3, normal affect, Insight and Judgment appropriate.    Assessment and Plan  1. Annual Preventative Screening Examination  2. Essential hypertension  - EKG 12-Lead - Urinalysis, Routine w reflex microscopic - Microalbumin / creatinine urine ratio - CBC with Differential/Platelet - COMPLETE METABOLIC PANEL WITH GFR - Magnesium - TSH   3. Hyperlipidemia, mixed  - EKG 12-Lead - Lipid panel - TSH   4. Abnormal glucose  - EKG 12-Lead - Hemoglobin A1c - Insulin, random   5. Vitamin D deficiency  - VITAMIN D 25 Hydroxy   6. OSA and COPD overlap syndrome (HCC)   7. Depression, major, recurrent, in partial remission (HCC)  - TSH   8. Aortic atherosclerosis (HCC) by Chest CT scan - 04/02/2021  - EKG 12-Lead   9. Chronic pain syndrome   10. DDD, lumbar   11. Bilateral hip pain  - Ambulatory referral to Orthopedic Surgery   12. Former smoker (30 pack/year, quit 2019)  - EKG 12-Lead - CT CHEST LUNG CANCER SCREENING LOW DOSE WO CONTRAST; Future   13. FHx: heart disease  - EKG 12-Lead   14. Screening for colorectal cancer  - POC Hemoccult Bld/Stl (3-Cd Home Screen); Future   15. Screening for lung cancer  - CT CHEST LUNG CANCER SCREENING LOW DOSE WO CONTRAST   16. Medication management  - Urinalysis, Routine w reflex microscopic - Microalbumin / creatinine urine ratio - CBC with Differential/Platelet -  COMPLETE METABOLIC PANEL WITH GFR - Magnesium - Lipid panel - TSH - Hemoglobin A1c - Insulin, random - VITAMIN D 25 Hydroxy           Patient was counseled in prudent diet to achieve/maintain BMI less than 25 for weight control, BP monitoring, regular  exercise and medications. Discussed med's effects and SE's. Screening labs and tests as requested with regular follow-up as recommended. Over 40 minutes of exam, counseling, chart review and high complex critical decision making was performed.   Marinus Maw, MD

## 2022-11-03 NOTE — Patient Instructions (Signed)

## 2022-11-04 LAB — URINALYSIS, ROUTINE W REFLEX MICROSCOPIC
Bilirubin Urine: NEGATIVE
Glucose, UA: NEGATIVE
Hgb urine dipstick: NEGATIVE
Ketones, ur: NEGATIVE
Leukocytes,Ua: NEGATIVE
Nitrite: NEGATIVE
Protein, ur: NEGATIVE
Specific Gravity, Urine: 1.013 (ref 1.001–1.035)
pH: 5.5 (ref 5.0–8.0)

## 2022-11-04 LAB — CBC WITH DIFFERENTIAL/PLATELET
Absolute Monocytes: 572 cells/uL (ref 200–950)
Basophils Absolute: 62 cells/uL (ref 0–200)
Basophils Relative: 0.7 %
Eosinophils Absolute: 466 cells/uL (ref 15–500)
Eosinophils Relative: 5.3 %
HCT: 45.4 % — ABNORMAL HIGH (ref 35.0–45.0)
Hemoglobin: 14.1 g/dL (ref 11.7–15.5)
Lymphs Abs: 2578 cells/uL (ref 850–3900)
MCH: 29.4 pg (ref 27.0–33.0)
MCHC: 31.1 g/dL — ABNORMAL LOW (ref 32.0–36.0)
MCV: 94.8 fL (ref 80.0–100.0)
MPV: 10.5 fL (ref 7.5–12.5)
Monocytes Relative: 6.5 %
Neutro Abs: 5122 cells/uL (ref 1500–7800)
Neutrophils Relative %: 58.2 %
Platelets: 282 10*3/uL (ref 140–400)
RBC: 4.79 10*6/uL (ref 3.80–5.10)
RDW: 12.1 % (ref 11.0–15.0)
Total Lymphocyte: 29.3 %
WBC: 8.8 10*3/uL (ref 3.8–10.8)

## 2022-11-04 LAB — COMPLETE METABOLIC PANEL WITH GFR
AG Ratio: 1.6 (calc) (ref 1.0–2.5)
ALT: 28 U/L (ref 6–29)
AST: 30 U/L (ref 10–35)
Albumin: 4.4 g/dL (ref 3.6–5.1)
Alkaline phosphatase (APISO): 77 U/L (ref 37–153)
BUN/Creatinine Ratio: 25 (calc) — ABNORMAL HIGH (ref 6–22)
BUN: 32 mg/dL — ABNORMAL HIGH (ref 7–25)
CO2: 22 mmol/L (ref 20–32)
Calcium: 10.5 mg/dL — ABNORMAL HIGH (ref 8.6–10.4)
Chloride: 106 mmol/L (ref 98–110)
Creat: 1.28 mg/dL — ABNORMAL HIGH (ref 0.50–1.05)
Globulin: 2.8 g/dL (calc) (ref 1.9–3.7)
Glucose, Bld: 108 mg/dL — ABNORMAL HIGH (ref 65–99)
Potassium: 4.6 mmol/L (ref 3.5–5.3)
Sodium: 139 mmol/L (ref 135–146)
Total Bilirubin: 0.7 mg/dL (ref 0.2–1.2)
Total Protein: 7.2 g/dL (ref 6.1–8.1)
eGFR: 46 mL/min/{1.73_m2} — ABNORMAL LOW (ref >=60)

## 2022-11-04 LAB — LIPID PANEL
Cholesterol: 165 mg/dL (ref <200)
HDL: 100 mg/dL (ref >=50)
LDL Cholesterol (Calc): 49 mg/dL (calc)
Non-HDL Cholesterol (Calc): 65 mg/dL (calc) (ref <130)
Total CHOL/HDL Ratio: 1.7 (calc) (ref <5.0)
Triglycerides: 84 mg/dL (ref <150)

## 2022-11-04 LAB — VITAMIN D 25 HYDROXY (VIT D DEFICIENCY, FRACTURES): Vit D, 25-Hydroxy: 82 ng/mL (ref 30–100)

## 2022-11-04 LAB — MAGNESIUM: Magnesium: 2 mg/dL (ref 1.5–2.5)

## 2022-11-04 LAB — HEMOGLOBIN A1C
Hgb A1c MFr Bld: 6.3 % of total Hgb — ABNORMAL HIGH (ref <5.7)
Mean Plasma Glucose: 134 mg/dL
eAG (mmol/L): 7.4 mmol/L

## 2022-11-04 LAB — TSH: TSH: 1.22 mIU/L (ref 0.40–4.50)

## 2022-11-04 LAB — INSULIN, RANDOM: Insulin: 32.8 u[IU]/mL — ABNORMAL HIGH

## 2022-11-04 LAB — MICROALBUMIN / CREATININE URINE RATIO
Creatinine, Urine: 80 mg/dL (ref 20–275)
Microalb Creat Ratio: 19 mg/g creat (ref <30)
Microalb, Ur: 1.5 mg/dL

## 2022-11-04 NOTE — Progress Notes (Signed)
<>*<>*<>*<>*<>*<>*<>*<>*<>*<>*<>*<>*<>*<>*<>*<>*<>*<>*<>*<>*<>*<>*<>*<>*<> <>*<>*<>*<>*<>*<>*<>*<>*<>*<>*<>*<>*<>*<>*<>*<>*<>*<>*<>*<>*<>*<>*<>*<>*<>  -  Kidney functions have DECREASED Dramatically                                                                                  From GFR 83 to 12 - about  1/2 !   - Kidney functions  look  Dehydrated  !   - Very important to drink adequate amounts of fluids to prevent permanent damage    - Recommend drink at least 6 bottles (16 ounces) of fluids /water /day = 96 Oz ~100 oz  - 100 oz = 3,000 cc or 3 liters / day  - >> That's 1 &1/2 bottles of a 2 liter soda bottle /day !     & recommend schedule a NV in about 10 days to                     recheck CME,  so don't end up on Dialysis                                                                       - So    - >       DRINK .......DRINK .....DRINK !   <>*<>*<>*<>*<>*<>*<>*<>*<>*<>*<>*<>*<>*<>*<>*<>*<>*<>*<>*<>*<>*<>*<>*<>*<> <>*<>*<>*<>*<>*<>*<>*<>*<>*<>*<>*<>*<>*<>*<>*<>*<>*<>*<>*<>*<>*<>*<>*<>*<>  -  A1c is up to 6.3% - getting closer to being a Diabetic when it gets to 6.5%   -  Being diabetic has a  300% increased risk for          Heart Attack,                       Stroke,                            Cancer  &                                      Alzheimer- type vascular Dementia.   It is very important that you work harder with diet by                         avoiding all foods that are white except chicken, fish & calliflower.    - Avoid white rice   !  !  ! (brown & wild rice is OK),   - Avoid white potatoes  !  !  ! (sweet potatoes in moderation is OK),   White bread or wheat bread or anything made out of  white flour ! ! !                                   like bagels, donuts, rolls, buns, biscuits, cakes,  - pastries, cookies, pizza crust, and pasta (made from white flour & egg  whites)   - vegetarian pasta or spinach or wheat pasta is OK.  - Multigrain breads like Arnold's, Pepperidge Farm or                                            multigrain sandwich thins or high fiber breads like   Eureka bread or "Dave's Killer" breads that are                                                                  4 to 5 grams fiber per slice !  are best.    Diet, exercise and weight loss can reverse and cure                                                                                    diabetes in the early stages.    - Diet, exercise and weight loss is very important in the                                          control and prevention of complications of diabetes which   affects every system in your body, ie.   -  Brain - dementia/stroke,  -  eyes - glaucoma/blindness,  -  heart - heart attack/heart failure,  -  kidneys - dialysis,  -  stomach - gastric paralysis,  -  intestines - malabsorption,  -  nerves - severe painful neuritis,  -  circulation - gangrene & loss of a leg(s)  - and finally  . . . . . . . . . . . . . . . . . Marland Kitchen    - CANCER    & ALZHEIMERs  <>*<>*<>*<>*<>*<>*<>*<>*<>*<>*<>*<>*<>*<>*<>*<>*<>*<>*<>*<>*<>*<>*<>*<>*<> <>*<>*<>*<>*<>*<>*<>*<>*<>*<>*<>*<>*<>*<>*<>*<>*<>*<>*<>*<>*<>*<>*<>*<>*<>   -  Chol = 165 - is very good,                           But is NO GOOD if don't get on a better diet &  Lose Weight & Lower Blood Sugar   <>*<>*<>*<>*<>*<>*<>*<>*<>*<>*<>*<>*<>*<>*<>*<>*<>*<>*<>*<>*<>*<>*<>*<>*<> <>*<>*<>*<>*<>*<>*<>*<>*<>*<>*<>*<>*<>*<>*<>*<>*<>*<>*<>*<>*<>*<>*<>*<>*<>  -  Vitamin D = 82 - Great - Please keep dosage same   <>*<>*<>*<>*<>*<>*<>*<>*<>*<>*<>*<>*<>*<>*<>*<>*<>*<>*<>*<>*<>*<>*<>*<>*<> <>*<>*<>*<>*<>*<>*<>*<>*<>*<>*<>*<>*<>*<>*<>*<>*<>*<>*<>*<>*<>*<>*<>*<>*<>

## 2022-11-05 ENCOUNTER — Ambulatory Visit
Admission: RE | Admit: 2022-11-05 | Discharge: 2022-11-05 | Disposition: A | Discharge status: Home / Self Care | Payer: Medicare Other | Source: Ambulatory Visit | Attending: Nurse Practitioner | Admitting: Nurse Practitioner

## 2022-11-05 DIAGNOSIS — E349 Endocrine disorder, unspecified: Secondary | ICD-10-CM | POA: Diagnosis not present

## 2022-11-05 DIAGNOSIS — M8588 Other specified disorders of bone density and structure, other site: Secondary | ICD-10-CM | POA: Diagnosis not present

## 2022-11-05 DIAGNOSIS — M810 Age-related osteoporosis without current pathological fracture: Secondary | ICD-10-CM

## 2022-11-08 ENCOUNTER — Telehealth: Payer: Self-pay | Admitting: Nurse Practitioner

## 2022-11-08 NOTE — Telephone Encounter (Signed)
Pt wants to know if she still has to go to [facility] on Oct. 17th to have her liver functions rechecked.

## 2022-11-11 NOTE — Telephone Encounter (Signed)
Patient advised.

## 2022-12-01 DIAGNOSIS — G894 Chronic pain syndrome: Secondary | ICD-10-CM | POA: Diagnosis not present

## 2022-12-01 DIAGNOSIS — M5441 Lumbago with sciatica, right side: Secondary | ICD-10-CM | POA: Diagnosis not present

## 2022-12-01 DIAGNOSIS — M160 Bilateral primary osteoarthritis of hip: Secondary | ICD-10-CM | POA: Diagnosis not present

## 2023-01-04 DIAGNOSIS — M5106 Intervertebral disc disorders with myelopathy, lumbar region: Secondary | ICD-10-CM | POA: Diagnosis not present

## 2023-01-04 DIAGNOSIS — M4726 Other spondylosis with radiculopathy, lumbar region: Secondary | ICD-10-CM | POA: Diagnosis not present

## 2023-01-04 DIAGNOSIS — G894 Chronic pain syndrome: Secondary | ICD-10-CM | POA: Diagnosis not present

## 2023-02-02 DIAGNOSIS — M5106 Intervertebral disc disorders with myelopathy, lumbar region: Secondary | ICD-10-CM | POA: Diagnosis not present

## 2023-02-02 DIAGNOSIS — G894 Chronic pain syndrome: Secondary | ICD-10-CM | POA: Diagnosis not present

## 2023-02-02 DIAGNOSIS — M4726 Other spondylosis with radiculopathy, lumbar region: Secondary | ICD-10-CM | POA: Diagnosis not present

## 2023-02-03 ENCOUNTER — Encounter: Payer: Self-pay | Admitting: Nurse Practitioner

## 2023-02-03 ENCOUNTER — Ambulatory Visit (INDEPENDENT_AMBULATORY_CARE_PROVIDER_SITE_OTHER): Payer: Medicare Other | Admitting: Nurse Practitioner

## 2023-02-03 VITALS — BP 112/78 | HR 85 | Temp 97.4°F | Ht 60.0 in | Wt 160.4 lb

## 2023-02-03 DIAGNOSIS — E559 Vitamin D deficiency, unspecified: Secondary | ICD-10-CM | POA: Diagnosis not present

## 2023-02-03 DIAGNOSIS — Z79899 Other long term (current) drug therapy: Secondary | ICD-10-CM | POA: Diagnosis not present

## 2023-02-03 DIAGNOSIS — E66811 Obesity, class 1: Secondary | ICD-10-CM

## 2023-02-03 DIAGNOSIS — N182 Chronic kidney disease, stage 2 (mild): Secondary | ICD-10-CM | POA: Diagnosis not present

## 2023-02-03 DIAGNOSIS — I10 Essential (primary) hypertension: Secondary | ICD-10-CM

## 2023-02-03 DIAGNOSIS — F3341 Major depressive disorder, recurrent, in partial remission: Secondary | ICD-10-CM

## 2023-02-03 DIAGNOSIS — E782 Mixed hyperlipidemia: Secondary | ICD-10-CM

## 2023-02-03 DIAGNOSIS — J449 Chronic obstructive pulmonary disease, unspecified: Secondary | ICD-10-CM | POA: Diagnosis not present

## 2023-02-03 DIAGNOSIS — G894 Chronic pain syndrome: Secondary | ICD-10-CM | POA: Diagnosis not present

## 2023-02-03 DIAGNOSIS — Z87891 Personal history of nicotine dependence: Secondary | ICD-10-CM

## 2023-02-03 DIAGNOSIS — Z8673 Personal history of transient ischemic attack (TIA), and cerebral infarction without residual deficits: Secondary | ICD-10-CM | POA: Diagnosis not present

## 2023-02-03 DIAGNOSIS — R7309 Other abnormal glucose: Secondary | ICD-10-CM | POA: Diagnosis not present

## 2023-02-03 DIAGNOSIS — M8700 Idiopathic aseptic necrosis of unspecified bone: Secondary | ICD-10-CM

## 2023-02-03 DIAGNOSIS — I2510 Atherosclerotic heart disease of native coronary artery without angina pectoris: Secondary | ICD-10-CM | POA: Diagnosis not present

## 2023-02-03 NOTE — Progress Notes (Signed)
3 FOLLOW UP Assessment:   Coronary atherosclerosis of native coronary artery/Atherosclerosis of aorta (HCC) - CT 04/02/2021 Control blood pressure, cholesterol (LDL <70), glucose, increase exercise. Cardiology follows, continue statin, ASA.  Disscused lifestyle modifications, diet & exercise Continue to monitor  Essential hypertension Controlled Discussed DASH (Dietary Approaches to Stop Hypertension) DASH diet is lower in sodium than a typical American diet. Cut back on foods that are high in saturated fat, cholesterol, and trans fats. Eat more whole-grain foods, fish, poultry, and nuts Remain active and exercise as tolerated daily.  Monitor BP at home-Call if greater than 130/80.  Check CMP/CBC  Chronic obstructive pulmonary disease, unspecified COPD type (HCC) Lung CT Scan completed 03/2022 - Benign.  Re-screen 1 year.    OSA and COPD overlap syndrome (HCC) Has CPAP - Discussed using regularly Weight loss encouraged  CKD (chronic kidney disease) stage 2, GFR 60-89 ml/min Discussed how what you eat and drink can aide in kidney protection. Stay well hydrated. Avoid high salt foods. Avoid NSAIDS. Keep BP and BG well controlled.   Take medications as prescribed. Remain active and exercise as tolerated daily. Maintain weight.  Continue to monitor. Check CMP/GFR/Microablumin  Depression, major, recurrent, in partial remission (HCC) Continue cymbalta for mood and pain benefit Discussed stress management techniques  Discussed, increase water,intake & good sleep hygiene  Discussed increasing exercise & vegetables in diet  Chronic pain syndrome DDD, lumbar Continue ortho/pain management follow up, Dr. Noel Gerold  Former Tobacco use disorder Low dose CT lung cancer screening UTD,  Other abnormal glucose (prediabetes) Education: Reviewed 'ABCs' of diabetes management  Discussed goals to be met and/or maintained include A1C (<7) Blood pressure (<130/80) Cholesterol (LDL  <70) Continue Eye Exam yearly  Continue Dental Exam Q6 mo Discussed dietary recommendations Discussed Physical Activity recommendations Check A1C  Mixed hyperlipidemia Discussed lifestyle modifications. Recommended diet heavy in fruits and veggies, omega 3's. Decrease consumption of animal meats, cheeses, and dairy products. Remain active and exercise as tolerated. Continue to monitor. Check lipids/TSH  Vitamin D deficiency Continue supplementation for goal of 60-100 Monitor levels  Medication management All medications discussed and reviewed in full. All questions and concerns regarding medications addressed.    History of TIA Neurology, Dr. Anne Hahn following  Obesity - BMI 31 Discussed appropriate BMI Diet modification. Physical activity. Encouraged/praised to build confidence.  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 Carrie Mew  Orders Placed This Encounter  Procedures   CBC with Differential/Platelet   COMPLETE METABOLIC PANEL WITH GFR   Lipid panel   Hemoglobin A1c   Notify office for further evaluation and treatment, questions or concerns if any reported s/s fail to improve.   The patient was advised to call back or seek an in-person evaluation if any symptoms worsen or if the condition fails to improve as anticipated.   Further disposition pending results of labs. Discussed med's effects and SE's.    I discussed the assessment and treatment plan with the patient. The patient was provided an opportunity to ask questions and all were answered. The patient agreed with the plan and demonstrated an understanding of the instructions.  Discussed med's effects and SE's. Screening labs and tests as requested with regular follow-up as recommended.  I provided 35 minutes of face-to-face time during this encounter including counseling, chart review, and critical decision making was preformed.    Future  Appointments  Date Time Provider Department Center  05/05/2023  10:30 AM Lucky Cowboy, MD [facility] None  08/09/2023 10:30 AM Adela Glimpse, NP [facility] None  11/16/2023 10:00 AM Lucky Cowboy, MD [facility] None   Subjective:  Amber Hickman is a 67 y.o. female who presents for a 3 month follow up. She has Hyperlipidemia, mixed; Vitamin D deficiency; COPD (chronic obstructive pulmonary disease) (HCC); Medication management; CKD (chronic kidney disease) stage 2, GFR 60-89 ml/min; DDD, lumbar; Chronic pain syndrome; Essential hypertension; Depression, major, recurrent, in partial remission (HCC); OSA and COPD overlap syndrome (HCC); Aortic atherosclerosis (HCC) by Chest CT scan - 04/02/2021; Abnormal glucose; Former smoker (30 pack/year, quit 2019); Obesity (BMI 30.0-34.9); History of colon polyps; Osteoporosis; Coronary atherosclerosis of native coronary artery; and Elevated LFTs on their problem list.  Overall she reports feeling well.  States that she has been eating too much ice cream.  Eats sometimes 2 pints of [employer]'s ice cream daily.  She also eats yogurt in the AM.  Calcium was elevated during CPE, thought to be due to dehydration.  She has increased her water intake to at least 64 oz daily.  She was found to have elevated LFTs with hepatitis work up that revealed reactive Hep A and B, thought to be from taking too much Tylenol.  She has since stopped and LFTs returned to normal during CPE.  She feels the need continue any further work up with GI, Dr. Carrie Mew scheduled for 02/23/23.  She has OSA and is not on CPAP due to intolerance, sleeps well when weight is down, declined referral back to sleep medicine.   She is a former smoker, quit in 2019 with 30 pack year hx.  Low dose CT lung screening 03/2022 benign.  Fused L3 down, follows Dr Noel Gerold. She sees pain management for her back pain, Dr. Loretha Brasil, gets injections.  They are prescribing her pain medications (oxycodone  15 mg q6h PRN), takes 1-4/day, recently pain is worse. Has residual RLE weakness/foot drop and calf wasting. Does have some gait instability if she rushes, reports benefited from PT in the past. Also has known AVN bil hips per MRI 2018, patient also reports in shoulders per ortho XR.   She has hx of MDD in remission on cymbalta 60 mg, also for pain benefit.   BMI is Body mass index is 31.33 kg/m., she is working on diet, exercise limited by back pain.  Wt Readings from Last 3 Encounters:  02/03/23 160 lb 6.4 oz (72.8 kg)  11/03/22 156 lb 6.4 oz (70.9 kg)  09/13/22 150 lb (68 kg)   She had a normal echo 04/2015, normal stress test 04/2015. Aortic atherosclerosis per CT 09/2019. She had CT coronary scan by Dr. Jens Som, total calcium score 126, LAD 113, RCA 12, 82 % for age/gender, mild non-obstructive CAD.   Her blood pressure has been labile, ranging 110s/60s to 150/90s, today their BP is BP: 112/78,   BP Readings from Last 3 Encounters:  02/03/23 112/78  11/03/22 110/68  09/13/22 118/80    She does not workout. She denies chest pain, shortness of breath, dizziness.   She is on cholesterol medication (rosuvastatin 40 mg and more recently on zetia 10 mg by cardiology) and denies myalgias. Her cholesterol is at goal <70. The cholesterol last visit was:   Lab Results  Component Value Date   CHOL 165 11/03/2022   HDL 100 11/03/2022   LDLCALC 49 11/03/2022   TRIG 84 11/03/2022   CHOLHDL 1.7 11/03/2022    She has been working on  diet and exercise for prediabetes, and denies increased appetite, nausea, paresthesia of the feet, polydipsia, polyuria and visual disturbances. Last A1C in the office was:  Lab Results  Component Value Date   HGBA1C 6.3 (H) 11/03/2022   Patient is on Vitamin D supplement.   Lab Results  Component Value Date   VD25OH 82 11/03/2022       Medication Review: Current Outpatient Medications on File Prior to Visit  Medication Sig Dispense Refill   alendronate  (FOSAMAX) 70 MG tablet Take 1 tablet (70 mg total) by mouth every 7 (seven) days. Take with a full glass of water on an empty stomach. 12 tablet 3   aspirin EC 81 MG tablet Take 81 mg by mouth daily. Swallow whole.     baclofen (LIORESAL) 20 MG tablet Take 20 mg by mouth 3 (three) times daily as needed for muscle spasms.     cholecalciferol 5000 units TABS Take 2 tablets daily     DULoxetine (CYMBALTA) 60 MG capsule Take 1 capsule daily for chronic pain 90 capsule 3   ezetimibe (ZETIA) 10 MG tablet Take 1 tablet (10 mg total) by mouth daily. 90 tablet 3   fluticasone-salmeterol (WIXELA INHUB) 500-50 MCG/ACT AEPB Use   1 Inhalation   2 x / day (every 12 hours)  for Asthma 180 each 3   ketotifen (ZADITOR) 0.025 % ophthalmic solution Place 1 drop into both eyes 3 (three) times daily as needed (allergies).     olmesartan (BENICAR) 20 MG tablet TAKE 1 TABLET BY MOUTH DAILY 30 tablet 11   oxyCODONE (ROXICODONE) 15 MG immediate release tablet Take 15 mg by mouth every 6 (six) hours as needed for pain.   0   rosuvastatin (CRESTOR) 40 MG tablet Take 1 tab daily for cholesterol. 90 tablet 3   Zinc 50 MG TABS Take by mouth daily.     No current facility-administered medications on file prior to visit.    Allergies: Allergies  Allergen Reactions   Augmentin [Amoxicillin-Pot Clavulanate] Nausea And Vomiting   Codeine Nausea And Vomiting   Zoloft [Sertraline Hcl]     Pt does not recall reaction    Lipitor [Atorvastatin] Other (See Comments)    Leg pain    Current Problems (verified) has Hyperlipidemia, mixed; Vitamin D deficiency; COPD (chronic obstructive pulmonary disease) (HCC); Medication management; CKD (chronic kidney disease) stage 2, GFR 60-89 ml/min; DDD, lumbar; Chronic pain syndrome; Essential hypertension; Depression, major, recurrent, in partial remission (HCC); OSA and COPD overlap syndrome (HCC); Aortic atherosclerosis (HCC) by Chest CT scan - 04/02/2021; Abnormal glucose; Former smoker  (30 pack/year, quit 2019); Obesity (BMI 30.0-34.9); History of colon polyps; Osteoporosis; Coronary atherosclerosis of native coronary artery; and Elevated LFTs on their problem list.  Screening Tests Immunization History  Administered Date(s) Administered   PFIZER(Purple Top)SARS-COV-2 Vaccination 07/02/2019, 07/24/2019   Td 01/19/2004, 06/05/2015   Health Maintenance  Topic Date Due   Pneumonia Vaccine 19+ Years old (1 of 2 - PCV) Never done   Zoster Vaccines- Shingrix (1 of 2) Never done   COVID-19 Vaccine (3 - Pfizer risk series) 08/21/2019   Lung Cancer Screening  04/09/2023   Medicare Annual Wellness (AWV)  05/03/2023   MAMMOGRAM  04/08/2024   DTaP/Tdap/Td (3 - Tdap) 06/04/2025   Colonoscopy  01/06/2026   DEXA SCAN  Completed   Hepatitis C Screening  Completed   HPV VACCINES  Aged Out   INFLUENZA VACCINE  Discontinued    Patient Care Team: Lucky Cowboy, MD  as PCP - General (Internal Medicine) Jens Som Madolyn Frieze, MD as PCP - Cardiology (Cardiology) Sharrell Ku, MD as Consulting Physician (Gastroenterology) Javier Glazier as Referring Physician (Physician Assistant) Mammography, [facility] (Diagnostic Radiology)  Surgical: She  has a past surgical history that includes Cataract extraction (Bilateral); Spine surgery; Ankle surgery (Right); Tonsillectomy and adenoidectomy; Back surgery; and Breast excisional biopsy (Right). Family Her family history includes COPD in her mother; Cancer in her mother; Coronary artery disease in her paternal uncle; Heart disease in her father; Hypertension in her mother; Stroke in her mother. Social history  She reports that she quit smoking about 5 years ago. Her smoking use included cigarettes. She started smoking about 35 years ago. She has a 30 pack-year smoking history. She has never used smokeless tobacco. She reports that she does not drink alcohol and does not use drugs.  Objective:   Today's Vitals   02/03/23 1054  BP:  112/78  Pulse: 85  Temp: (!) 97.4 F (36.3 C)  SpO2: 98%  Weight: 160 lb 6.4 oz (72.8 kg)  Height: 5' (1.524 m)   Body mass index is 31.33 kg/m.  General appearance: alert, no distress, WD/WN, female HEENT: normocephalic, sclerae anicteric, TMs pearly, nares patent, no discharge or erythema, pharynx normal Oral cavity: MMM, no lesions Neck: supple, no lymphadenopathy, no thyromegaly, no masses Heart: RRR, normal S1, S2, no murmurs Lungs: CTA bilaterally, no wheezes, rhonchi, or rales Abdomen: +bs, soft, non tender, non distended, no masses, no hepatomegaly, no splenomegaly Musculoskeletal: nontender, no swelling, does have obvious atrophy to calf.  Tenderness to bilateral lumbar, no CVA tenderness noted.  Extremities: no edema, no cyanosis, no clubbing, right leg with some atrophy and decrease muscle compared to left.  Pulses: 2+ symmetric, upper extremities, RLE thready pulses and cold lower leg, does blanch/mildly slow cap refill. No wounds. Neurological: alert, oriented x 3, CN2-12 intact, strength normal upper extremities Weakness with ankle extension R.  Psychiatric: normal affect, behavior normal, pleasant      Adela Glimpse, NP   02/03/2023

## 2023-02-03 NOTE — Patient Instructions (Signed)

## 2023-02-04 LAB — LIPID PANEL
Cholesterol: 150 mg/dL (ref <200)
HDL: 91 mg/dL (ref >=50)
LDL Cholesterol (Calc): 44 mg/dL
Non-HDL Cholesterol (Calc): 59 mg/dL (ref <130)
Total CHOL/HDL Ratio: 1.6 (calc) (ref <5.0)
Triglycerides: 70 mg/dL (ref <150)

## 2023-02-04 LAB — COMPLETE METABOLIC PANEL WITH GFR
AG Ratio: 1.9 (calc) (ref 1.0–2.5)
ALT: 31 U/L — ABNORMAL HIGH (ref 6–29)
AST: 31 U/L (ref 10–35)
Albumin: 4.6 g/dL (ref 3.6–5.1)
Alkaline phosphatase (APISO): 73 U/L (ref 37–153)
BUN: 22 mg/dL (ref 7–25)
CO2: 27 mmol/L (ref 20–32)
Calcium: 10.8 mg/dL — ABNORMAL HIGH (ref 8.6–10.4)
Chloride: 104 mmol/L (ref 98–110)
Creat: 1 mg/dL (ref 0.50–1.05)
Globulin: 2.4 g/dL (ref 1.9–3.7)
Glucose, Bld: 125 mg/dL — ABNORMAL HIGH (ref 65–99)
Potassium: 5.3 mmol/L (ref 3.5–5.3)
Sodium: 139 mmol/L (ref 135–146)
Total Bilirubin: 0.6 mg/dL (ref 0.2–1.2)
Total Protein: 7 g/dL (ref 6.1–8.1)
eGFR: 62 mL/min/{1.73_m2} (ref >=60)

## 2023-02-04 LAB — CBC WITH DIFFERENTIAL/PLATELET
Absolute Lymphocytes: 1733 {cells}/uL (ref 850–3900)
Absolute Monocytes: 872 {cells}/uL (ref 200–950)
Basophils Absolute: 33 {cells}/uL (ref 0–200)
Basophils Relative: 0.3 %
Eosinophils Absolute: 251 {cells}/uL (ref 15–500)
Eosinophils Relative: 2.3 %
HCT: 42.4 % (ref 35.0–45.0)
Hemoglobin: 13.7 g/dL (ref 11.7–15.5)
MCH: 29.7 pg (ref 27.0–33.0)
MCHC: 32.3 g/dL (ref 32.0–36.0)
MCV: 91.8 fL (ref 80.0–100.0)
MPV: 10.4 fL (ref 7.5–12.5)
Monocytes Relative: 8 %
Neutro Abs: 8012 {cells}/uL — ABNORMAL HIGH (ref 1500–7800)
Neutrophils Relative %: 73.5 %
Platelets: 273 10*3/uL (ref 140–400)
RBC: 4.62 10*6/uL (ref 3.80–5.10)
RDW: 12.5 % (ref 11.0–15.0)
Total Lymphocyte: 15.9 %
WBC: 10.9 10*3/uL — ABNORMAL HIGH (ref 3.8–10.8)

## 2023-02-04 LAB — HEMOGLOBIN A1C
Hgb A1c MFr Bld: 6.2 %{Hb} — ABNORMAL HIGH (ref <5.7)
Mean Plasma Glucose: 131 mg/dL
eAG (mmol/L): 7.3 mmol/L

## 2023-02-23 DIAGNOSIS — R748 Abnormal levels of other serum enzymes: Secondary | ICD-10-CM | POA: Diagnosis not present

## 2023-03-03 DIAGNOSIS — M5106 Intervertebral disc disorders with myelopathy, lumbar region: Secondary | ICD-10-CM | POA: Diagnosis not present

## 2023-03-03 DIAGNOSIS — M4726 Other spondylosis with radiculopathy, lumbar region: Secondary | ICD-10-CM | POA: Diagnosis not present

## 2023-03-03 DIAGNOSIS — G894 Chronic pain syndrome: Secondary | ICD-10-CM | POA: Diagnosis not present

## 2023-03-16 DIAGNOSIS — R748 Abnormal levels of other serum enzymes: Secondary | ICD-10-CM | POA: Diagnosis not present

## 2023-03-22 DIAGNOSIS — H524 Presbyopia: Secondary | ICD-10-CM | POA: Diagnosis not present

## 2023-03-22 DIAGNOSIS — H43813 Vitreous degeneration, bilateral: Secondary | ICD-10-CM | POA: Diagnosis not present

## 2023-03-22 DIAGNOSIS — Z961 Presence of intraocular lens: Secondary | ICD-10-CM | POA: Diagnosis not present

## 2023-03-22 DIAGNOSIS — D3132 Benign neoplasm of left choroid: Secondary | ICD-10-CM | POA: Diagnosis not present

## 2023-03-23 ENCOUNTER — Other Ambulatory Visit: Payer: Self-pay | Admitting: Nurse Practitioner

## 2023-03-23 DIAGNOSIS — E782 Mixed hyperlipidemia: Secondary | ICD-10-CM

## 2023-03-29 ENCOUNTER — Encounter: Payer: Self-pay | Admitting: *Deleted

## 2023-04-05 DIAGNOSIS — M5106 Intervertebral disc disorders with myelopathy, lumbar region: Secondary | ICD-10-CM | POA: Diagnosis not present

## 2023-04-05 DIAGNOSIS — G894 Chronic pain syndrome: Secondary | ICD-10-CM | POA: Diagnosis not present

## 2023-04-05 DIAGNOSIS — M4726 Other spondylosis with radiculopathy, lumbar region: Secondary | ICD-10-CM | POA: Diagnosis not present

## 2023-05-05 ENCOUNTER — Ambulatory Visit: Payer: Medicare Other | Admitting: Internal Medicine

## 2023-05-05 ENCOUNTER — Ambulatory Visit: Payer: Medicare Other | Admitting: Nurse Practitioner

## 2023-05-24 ENCOUNTER — Encounter: Payer: Self-pay | Admitting: *Deleted

## 2023-05-24 ENCOUNTER — Other Ambulatory Visit: Payer: Self-pay | Admitting: Family Medicine

## 2023-05-24 ENCOUNTER — Telehealth: Payer: Self-pay | Admitting: *Deleted

## 2023-05-24 ENCOUNTER — Other Ambulatory Visit: Payer: Self-pay | Admitting: *Deleted

## 2023-05-24 DIAGNOSIS — Z122 Encounter for screening for malignant neoplasm of respiratory organs: Secondary | ICD-10-CM

## 2023-05-24 DIAGNOSIS — Z1231 Encounter for screening mammogram for malignant neoplasm of breast: Secondary | ICD-10-CM

## 2023-05-24 DIAGNOSIS — Z87891 Personal history of nicotine dependence: Secondary | ICD-10-CM

## 2023-05-24 NOTE — Telephone Encounter (Signed)
 Copied from CRM [id number]. Topic: Clinical - Request for Lab/Test Order >> May 24, 2023  2:08 PM Theodis Sato wrote: Reason for CRM: Patient states she missed a call from nurse Angelique Blonder to schedule her yearly lung CT - Please call patient back when available.  Spoke to pt and scheduled lung screening CT 06/07/2023 1:00 at [facility].

## 2023-05-24 NOTE — Telephone Encounter (Signed)
 Left message for pt to call to schedule yearly lung screening CT. SDMV was done through PCP office In 2018.

## 2023-05-25 ENCOUNTER — Other Ambulatory Visit: Payer: Self-pay | Admitting: Orthopaedic Surgery

## 2023-05-25 DIAGNOSIS — M546 Pain in thoracic spine: Secondary | ICD-10-CM

## 2023-05-25 DIAGNOSIS — M545 Low back pain, unspecified: Secondary | ICD-10-CM

## 2023-06-07 ENCOUNTER — Ambulatory Visit
Admission: RE | Admit: 2023-06-07 | Discharge: 2023-06-07 | Disposition: A | Discharge status: Home / Self Care | Source: Ambulatory Visit | Attending: Acute Care | Admitting: Acute Care

## 2023-06-07 ENCOUNTER — Ambulatory Visit
Admission: RE | Admit: 2023-06-07 | Discharge: 2023-06-07 | Disposition: A | Discharge status: Home / Self Care | Source: Ambulatory Visit | Attending: Family Medicine | Admitting: Family Medicine

## 2023-06-07 DIAGNOSIS — Z122 Encounter for screening for malignant neoplasm of respiratory organs: Secondary | ICD-10-CM

## 2023-06-07 DIAGNOSIS — Z87891 Personal history of nicotine dependence: Secondary | ICD-10-CM

## 2023-06-07 DIAGNOSIS — Z1231 Encounter for screening mammogram for malignant neoplasm of breast: Secondary | ICD-10-CM

## 2023-06-10 ENCOUNTER — Ambulatory Visit
Admission: RE | Admit: 2023-06-10 | Discharge: 2023-06-10 | Disposition: A | Discharge status: Home / Self Care | Source: Ambulatory Visit | Attending: Orthopaedic Surgery | Admitting: Orthopaedic Surgery

## 2023-06-10 DIAGNOSIS — M546 Pain in thoracic spine: Secondary | ICD-10-CM

## 2023-06-10 DIAGNOSIS — M545 Low back pain, unspecified: Secondary | ICD-10-CM

## 2023-06-23 DIAGNOSIS — M791 Myalgia, unspecified site: Secondary | ICD-10-CM | POA: Diagnosis not present

## 2023-06-23 DIAGNOSIS — M5416 Radiculopathy, lumbar region: Secondary | ICD-10-CM | POA: Diagnosis not present

## 2023-06-30 DIAGNOSIS — G894 Chronic pain syndrome: Secondary | ICD-10-CM | POA: Diagnosis not present

## 2023-06-30 DIAGNOSIS — Z981 Arthrodesis status: Secondary | ICD-10-CM | POA: Diagnosis not present

## 2023-07-04 ENCOUNTER — Telehealth: Payer: Self-pay

## 2023-07-04 ENCOUNTER — Telehealth: Payer: Self-pay | Admitting: Acute Care

## 2023-07-04 DIAGNOSIS — Z87891 Personal history of nicotine dependence: Secondary | ICD-10-CM

## 2023-07-04 DIAGNOSIS — R911 Solitary pulmonary nodule: Secondary | ICD-10-CM

## 2023-07-04 NOTE — Telephone Encounter (Signed)
 Call Report From Tiffany  IMPRESSION: Lung-RADS 4B, suspicious. Additional imaging evaluation or consultation with Pulmonology or Thoracic Surgery recommended. New left upper lobe nodule or area of nodular consolidation of 10.1 mm. Consider further evaluation with PET. If the patient has infectious symptoms, antibiotic therapy and CT follow-up at 6-8 weeks could be performed.   Aortic atherosclerosis (ICD10-I70.0), coronary artery atherosclerosis and emphysema (ICD10-J43.9).

## 2023-07-04 NOTE — Telephone Encounter (Signed)
 I have called the patient with the results of her LDCT. Her scan was read as a LR 4 B. She has a new 10.1 mm nodule in the left upper lobe. She states she was sick at the time of the scan 06/07/23  ( Read 07/01/2023) .She is in agreement with doing an 8 week follow up scan to re-evaluate this finding.This will be due mid June 2025.  Please order LDCT mid June 2025. Fax results to PCP, let her know patient was sick at the time , and that we are doing a short term follow up scan to better evaluate. Thanks so much

## 2023-07-04 NOTE — Telephone Encounter (Signed)
 See other note from 07/04/2023

## 2023-07-04 NOTE — Addendum Note (Signed)
 Addended by: Pearl Bottcher D on: 07/04/2023 10:36 AM   Modules accepted: Orders

## 2023-07-04 NOTE — Telephone Encounter (Signed)
 Results / plans faxed to PCP. Order placed for 8 week nodule f/u CT.

## 2023-07-27 DIAGNOSIS — K760 Fatty (change of) liver, not elsewhere classified: Secondary | ICD-10-CM | POA: Diagnosis not present

## 2023-08-02 DIAGNOSIS — M546 Pain in thoracic spine: Secondary | ICD-10-CM | POA: Diagnosis not present

## 2023-08-02 DIAGNOSIS — G894 Chronic pain syndrome: Secondary | ICD-10-CM | POA: Diagnosis not present

## 2023-08-02 DIAGNOSIS — M545 Low back pain, unspecified: Secondary | ICD-10-CM | POA: Diagnosis not present

## 2023-08-09 ENCOUNTER — Ambulatory Visit: Payer: Medicare Other | Admitting: Nurse Practitioner

## 2023-08-22 DIAGNOSIS — I10 Essential (primary) hypertension: Secondary | ICD-10-CM | POA: Diagnosis not present

## 2023-08-22 DIAGNOSIS — E785 Hyperlipidemia, unspecified: Secondary | ICD-10-CM | POA: Diagnosis not present

## 2023-08-22 DIAGNOSIS — M545 Low back pain, unspecified: Secondary | ICD-10-CM | POA: Diagnosis not present

## 2023-08-22 DIAGNOSIS — R911 Solitary pulmonary nodule: Secondary | ICD-10-CM | POA: Diagnosis not present

## 2023-08-22 DIAGNOSIS — R748 Abnormal levels of other serum enzymes: Secondary | ICD-10-CM | POA: Diagnosis not present

## 2023-08-22 DIAGNOSIS — R7303 Prediabetes: Secondary | ICD-10-CM | POA: Diagnosis not present

## 2023-08-24 ENCOUNTER — Ambulatory Visit
Admission: RE | Admit: 2023-08-24 | Discharge: 2023-08-24 | Disposition: A | Discharge status: Home / Self Care | Source: Ambulatory Visit | Attending: Nurse Practitioner | Admitting: Nurse Practitioner

## 2023-08-24 DIAGNOSIS — Z87891 Personal history of nicotine dependence: Secondary | ICD-10-CM | POA: Diagnosis not present

## 2023-08-24 DIAGNOSIS — R911 Solitary pulmonary nodule: Secondary | ICD-10-CM

## 2023-08-24 DIAGNOSIS — R918 Other nonspecific abnormal finding of lung field: Secondary | ICD-10-CM | POA: Diagnosis not present

## 2023-08-30 DIAGNOSIS — Z981 Arthrodesis status: Secondary | ICD-10-CM | POA: Diagnosis not present

## 2023-08-30 DIAGNOSIS — M4727 Other spondylosis with radiculopathy, lumbosacral region: Secondary | ICD-10-CM | POA: Diagnosis not present

## 2023-08-30 DIAGNOSIS — G894 Chronic pain syndrome: Secondary | ICD-10-CM | POA: Diagnosis not present

## 2023-08-30 DIAGNOSIS — M545 Low back pain, unspecified: Secondary | ICD-10-CM | POA: Diagnosis not present

## 2023-09-05 ENCOUNTER — Telehealth: Payer: Self-pay | Admitting: Acute Care

## 2023-09-05 DIAGNOSIS — R911 Solitary pulmonary nodule: Secondary | ICD-10-CM

## 2023-09-05 NOTE — Telephone Encounter (Signed)
 Called and spoke with pt. Informed her of the results of her LDCT. The 10.3mm nodule has shrunk to 6.6mm. The 7.45mm nodule has grown to 10.35mm, waxing and waning nodule. Patient is aware the recommendations are for a 6 month follow up scan, she is agreeable to this. Order placed for follow up scan.    IMPRESSION: 1. Lung-RADS 3, probably benign findings. Short-term follow-up in 6 months is recommended with repeat low-dose chest CT without contrast (please use the following order, CT CHEST LCS NODULE FOLLOW-UP W/O CM). 2. Persistent lower lobe predominant subpleural reticulation and patchy ground-glass opacities, which may reflect interstitial lung disease. 3. Aortic Atherosclerosis (ICD10-I70.0) and Emphysema (ICD10-J43.9). Coronary artery calcifications. Assessment for potential risk factor modification, dietary therapy or pharmacologic therapy may be warranted, if clinically indicated.     Electronically Signed   By: Limin  Xu M.D.   On: 09/02/2023 14:16

## 2023-09-07 ENCOUNTER — Telehealth: Payer: Self-pay

## 2023-09-07 NOTE — Telephone Encounter (Signed)
 Called and spoke to [place] with [facility]. Advised her the pt is already being followed for her lung nodule and consult would be moot at this time. I informed Katheryn if the pt would have a concerning scan we would take the same approach with treatment, pt would be seen in the office and best treatment approach would be decided by Sarah or Dr. Shelah. Patient will have her 6 month follow up scan in 02/2024. Will forward this information to pt's PCP, Bascom Necessary, NP, as FYI.

## 2023-09-07 NOTE — Telephone Encounter (Signed)
 Copied from CRM [id number]. Topic: Appointments - Scheduling Inquiry for Clinic >> Sep 07, 2023  9:48 AM Leila C wrote: Reason for CRM: Katheryn from [place] physician at [facility] [phone] states patient told Katheryn, patient's appointment 10/27/23 at 1 pm with Dr. Shelah was cancelled because the office needed patient to have a CT to see if an appointment is needed. Katheryn states their office referred the patient and wants an explanation. Per CAL, appointment was cancelled by Provider (patient in LCS program, following lung nodule). See 09/05/23, telephone results. Informed Katheryn, we are unsure and will send the message to the nurse for clarification. Please advise and call back.   Canceled: 09/01/2023 12:21 PM 09/05/2023 10:45 AM By: By: GRACIA ARTHOR SIDE, ELISE K  Cancel Rsn: Provider (patient in LCS program, following lung nodule)    Routing to Kelly Hick, RN

## 2023-09-28 DIAGNOSIS — G541 Lumbosacral plexus disorders: Secondary | ICD-10-CM | POA: Diagnosis not present

## 2023-10-27 ENCOUNTER — Ambulatory Visit: Admitting: Emergency Medicine

## 2023-11-01 DIAGNOSIS — M791 Myalgia, unspecified site: Secondary | ICD-10-CM | POA: Diagnosis not present

## 2023-11-01 DIAGNOSIS — M545 Low back pain, unspecified: Secondary | ICD-10-CM | POA: Diagnosis not present

## 2023-11-01 DIAGNOSIS — Z981 Arthrodesis status: Secondary | ICD-10-CM | POA: Diagnosis not present

## 2023-11-01 DIAGNOSIS — G894 Chronic pain syndrome: Secondary | ICD-10-CM | POA: Diagnosis not present

## 2023-11-16 ENCOUNTER — Encounter: Payer: Medicare Other | Admitting: Internal Medicine

## 2023-11-22 DIAGNOSIS — I10 Essential (primary) hypertension: Secondary | ICD-10-CM | POA: Diagnosis not present

## 2023-11-22 DIAGNOSIS — L309 Dermatitis, unspecified: Secondary | ICD-10-CM | POA: Diagnosis not present

## 2023-11-22 DIAGNOSIS — B350 Tinea barbae and tinea capitis: Secondary | ICD-10-CM | POA: Diagnosis not present

## 2023-11-22 DIAGNOSIS — M545 Low back pain, unspecified: Secondary | ICD-10-CM | POA: Diagnosis not present

## 2023-11-22 DIAGNOSIS — E785 Hyperlipidemia, unspecified: Secondary | ICD-10-CM | POA: Diagnosis not present

## 2023-11-30 DIAGNOSIS — M791 Myalgia, unspecified site: Secondary | ICD-10-CM | POA: Diagnosis not present

## 2023-11-30 DIAGNOSIS — M47816 Spondylosis without myelopathy or radiculopathy, lumbar region: Secondary | ICD-10-CM | POA: Diagnosis not present

## 2023-11-30 DIAGNOSIS — G894 Chronic pain syndrome: Secondary | ICD-10-CM | POA: Diagnosis not present

## 2023-11-30 DIAGNOSIS — Z981 Arthrodesis status: Secondary | ICD-10-CM | POA: Diagnosis not present

## 2024-02-24 ENCOUNTER — Ambulatory Visit
Admission: RE | Admit: 2024-02-24 | Discharge: 2024-02-24 | Disposition: A | Source: Ambulatory Visit | Attending: Acute Care | Admitting: Acute Care

## 2024-02-24 DIAGNOSIS — R911 Solitary pulmonary nodule: Secondary | ICD-10-CM

## 2024-03-06 ENCOUNTER — Other Ambulatory Visit: Payer: Self-pay

## 2024-03-06 DIAGNOSIS — Z87891 Personal history of nicotine dependence: Secondary | ICD-10-CM

## 2024-03-06 DIAGNOSIS — R911 Solitary pulmonary nodule: Secondary | ICD-10-CM

## 2024-03-06 DIAGNOSIS — Z122 Encounter for screening for malignant neoplasm of respiratory organs: Secondary | ICD-10-CM
